# Patient Record
Sex: Female | Born: 1945
Health system: Southern US, Community
[De-identification: ages and names within clinical notes are randomized; demographics above are authoritative.]

## PROBLEM LIST (undated history)

## (undated) DIAGNOSIS — J449 Chronic obstructive pulmonary disease, unspecified: Secondary | ICD-10-CM

## (undated) DIAGNOSIS — Z860100 Personal history of colon polyps, unspecified: Secondary | ICD-10-CM

## (undated) DIAGNOSIS — M62830 Muscle spasm of back: Secondary | ICD-10-CM

## (undated) DIAGNOSIS — Z8719 Personal history of other diseases of the digestive system: Secondary | ICD-10-CM

## (undated) DIAGNOSIS — Z8709 Personal history of other diseases of the respiratory system: Secondary | ICD-10-CM

## (undated) DIAGNOSIS — D649 Anemia, unspecified: Secondary | ICD-10-CM

## (undated) DIAGNOSIS — E785 Hyperlipidemia, unspecified: Secondary | ICD-10-CM

## (undated) DIAGNOSIS — I219 Acute myocardial infarction, unspecified: Secondary | ICD-10-CM

## (undated) DIAGNOSIS — N319 Neuromuscular dysfunction of bladder, unspecified: Secondary | ICD-10-CM

## (undated) DIAGNOSIS — F329 Major depressive disorder, single episode, unspecified: Secondary | ICD-10-CM

## (undated) DIAGNOSIS — R0602 Shortness of breath: Secondary | ICD-10-CM

## (undated) DIAGNOSIS — T7840XA Allergy, unspecified, initial encounter: Secondary | ICD-10-CM

## (undated) DIAGNOSIS — G8929 Other chronic pain: Secondary | ICD-10-CM

## (undated) DIAGNOSIS — Z5189 Encounter for other specified aftercare: Secondary | ICD-10-CM

## (undated) DIAGNOSIS — I639 Cerebral infarction, unspecified: Secondary | ICD-10-CM

## (undated) DIAGNOSIS — T4145XA Adverse effect of unspecified anesthetic, initial encounter: Secondary | ICD-10-CM

## (undated) DIAGNOSIS — R233 Spontaneous ecchymoses: Secondary | ICD-10-CM

## (undated) DIAGNOSIS — K649 Unspecified hemorrhoids: Secondary | ICD-10-CM

## (undated) DIAGNOSIS — R351 Nocturia: Secondary | ICD-10-CM

## (undated) DIAGNOSIS — M719 Bursopathy, unspecified: Secondary | ICD-10-CM

## (undated) DIAGNOSIS — K592 Neurogenic bowel, not elsewhere classified: Secondary | ICD-10-CM

## (undated) DIAGNOSIS — G4700 Insomnia, unspecified: Secondary | ICD-10-CM

## (undated) DIAGNOSIS — W19XXXA Unspecified fall, initial encounter: Secondary | ICD-10-CM

## (undated) DIAGNOSIS — R238 Other skin changes: Secondary | ICD-10-CM

## (undated) DIAGNOSIS — N189 Chronic kidney disease, unspecified: Secondary | ICD-10-CM

## (undated) DIAGNOSIS — R4702 Dysphasia: Secondary | ICD-10-CM

## (undated) DIAGNOSIS — M1990 Unspecified osteoarthritis, unspecified site: Secondary | ICD-10-CM

## (undated) DIAGNOSIS — R296 Repeated falls: Secondary | ICD-10-CM

## (undated) DIAGNOSIS — M549 Dorsalgia, unspecified: Secondary | ICD-10-CM

## (undated) DIAGNOSIS — Z87442 Personal history of urinary calculi: Secondary | ICD-10-CM

## (undated) DIAGNOSIS — A0472 Enterocolitis due to Clostridium difficile, not specified as recurrent: Secondary | ICD-10-CM

## (undated) DIAGNOSIS — R202 Paresthesia of skin: Secondary | ICD-10-CM

## (undated) DIAGNOSIS — R350 Frequency of micturition: Secondary | ICD-10-CM

## (undated) DIAGNOSIS — K589 Irritable bowel syndrome without diarrhea: Secondary | ICD-10-CM

## (undated) DIAGNOSIS — J189 Pneumonia, unspecified organism: Secondary | ICD-10-CM

## (undated) DIAGNOSIS — I10 Essential (primary) hypertension: Secondary | ICD-10-CM

## (undated) DIAGNOSIS — F32A Depression, unspecified: Secondary | ICD-10-CM

## (undated) HISTORY — PX: CYSTOSCOPY: SUR368

## (undated) HISTORY — DX: Enterocolitis due to Clostridium difficile, not specified as recurrent: A04.72

## (undated) HISTORY — PX: KNEE ARTHROSCOPY: SUR90

## (undated) HISTORY — DX: Encounter for other specified aftercare: Z51.89

## (undated) HISTORY — PX: TONSILLECTOMY: SUR1361

## (undated) HISTORY — PX: NECK SURGERY: SHX720

## (undated) HISTORY — DX: Hyperlipidemia, unspecified: E78.5

## (undated) HISTORY — DX: Irritable bowel syndrome, unspecified: K58.9

## (undated) HISTORY — PX: NISSEN FUNDOPLICATION: SHX2091

## (undated) HISTORY — PX: OTHER SURGICAL HISTORY: SHX169

## (undated) HISTORY — PX: KNEE SURGERY: SHX244

## (undated) HISTORY — DX: Allergy, unspecified, initial encounter: T78.40XA

## (undated) HISTORY — PX: CARPAL TUNNEL RELEASE: SHX101

## (undated) HISTORY — DX: Chronic obstructive pulmonary disease, unspecified: J44.9

## (undated) HISTORY — PX: APPENDECTOMY: SHX54

## (undated) HISTORY — DX: Major depressive disorder, single episode, unspecified: F32.9

## (undated) HISTORY — PX: CHOLECYSTECTOMY: SHX55

## (undated) HISTORY — DX: Essential (primary) hypertension: I10

## (undated) HISTORY — DX: Depression, unspecified: F32.A

## (undated) HISTORY — PX: OOPHORECTOMY: SHX86

## (undated) HISTORY — DX: Unspecified hemorrhoids: K64.9

## (undated) HISTORY — DX: Acute myocardial infarction, unspecified: I21.9

## (undated) HISTORY — PX: HERNIA REPAIR: SHX51

---

## 1998-09-08 DIAGNOSIS — T8859XA Other complications of anesthesia, initial encounter: Secondary | ICD-10-CM

## 1998-09-08 HISTORY — DX: Other complications of anesthesia, initial encounter: T88.59XA

## 2000-10-28 ENCOUNTER — Observation Stay (HOSPITAL_COMMUNITY): Admission: RE | Admit: 2000-10-28 | Discharge: 2000-10-30 | Payer: Self-pay | Admitting: Neurosurgery

## 2000-11-09 ENCOUNTER — Encounter: Admission: RE | Admit: 2000-11-09 | Discharge: 2000-11-09 | Payer: Self-pay | Admitting: Neurosurgery

## 2000-12-25 ENCOUNTER — Encounter: Admission: RE | Admit: 2000-12-25 | Discharge: 2000-12-25 | Payer: Self-pay | Admitting: Neurosurgery

## 2001-04-06 ENCOUNTER — Encounter: Admission: RE | Admit: 2001-04-06 | Discharge: 2001-04-06 | Payer: Self-pay | Admitting: Neurosurgery

## 2001-06-24 ENCOUNTER — Other Ambulatory Visit: Admission: RE | Admit: 2001-06-24 | Discharge: 2001-06-24 | Payer: Self-pay | Admitting: Obstetrics and Gynecology

## 2001-06-24 ENCOUNTER — Ambulatory Visit (HOSPITAL_COMMUNITY): Admission: RE | Admit: 2001-06-24 | Discharge: 2001-06-24 | Payer: Self-pay | Admitting: Obstetrics and Gynecology

## 2001-06-24 ENCOUNTER — Encounter: Payer: Self-pay | Admitting: Obstetrics and Gynecology

## 2002-01-19 ENCOUNTER — Encounter: Admission: RE | Admit: 2002-01-19 | Discharge: 2002-01-19 | Payer: Self-pay | Admitting: Neurosurgery

## 2002-02-25 ENCOUNTER — Ambulatory Visit (HOSPITAL_COMMUNITY): Admission: RE | Admit: 2002-02-25 | Discharge: 2002-02-25 | Payer: Self-pay | Admitting: Obstetrics and Gynecology

## 2002-02-25 ENCOUNTER — Encounter: Payer: Self-pay | Admitting: Obstetrics and Gynecology

## 2002-07-05 ENCOUNTER — Encounter: Payer: Self-pay | Admitting: Obstetrics and Gynecology

## 2002-07-05 ENCOUNTER — Ambulatory Visit (HOSPITAL_COMMUNITY): Admission: RE | Admit: 2002-07-05 | Discharge: 2002-07-05 | Payer: Self-pay | Admitting: Obstetrics and Gynecology

## 2002-07-11 ENCOUNTER — Ambulatory Visit (HOSPITAL_COMMUNITY): Admission: RE | Admit: 2002-07-11 | Discharge: 2002-07-11 | Payer: Self-pay | Admitting: Obstetrics and Gynecology

## 2002-07-11 ENCOUNTER — Encounter: Payer: Self-pay | Admitting: Obstetrics and Gynecology

## 2002-09-07 ENCOUNTER — Encounter: Payer: Self-pay | Admitting: Orthopaedic Surgery

## 2002-09-07 ENCOUNTER — Ambulatory Visit (HOSPITAL_COMMUNITY): Admission: RE | Admit: 2002-09-07 | Discharge: 2002-09-07 | Payer: Self-pay

## 2002-09-20 ENCOUNTER — Ambulatory Visit (HOSPITAL_COMMUNITY): Admission: RE | Admit: 2002-09-20 | Discharge: 2002-09-20 | Payer: Self-pay | Admitting: Neurosurgery

## 2002-10-19 ENCOUNTER — Observation Stay (HOSPITAL_COMMUNITY): Admission: RE | Admit: 2002-10-19 | Discharge: 2002-10-20 | Payer: Self-pay | Admitting: Neurosurgery

## 2002-10-22 ENCOUNTER — Inpatient Hospital Stay (HOSPITAL_COMMUNITY): Admission: EM | Admit: 2002-10-22 | Discharge: 2002-10-23 | Payer: Self-pay | Admitting: Emergency Medicine

## 2002-10-22 ENCOUNTER — Encounter: Payer: Self-pay | Admitting: Neurosurgery

## 2002-10-25 ENCOUNTER — Inpatient Hospital Stay (HOSPITAL_COMMUNITY): Admission: AD | Admit: 2002-10-25 | Discharge: 2002-10-28 | Payer: Self-pay | Admitting: Neurosurgery

## 2003-04-03 ENCOUNTER — Ambulatory Visit (HOSPITAL_COMMUNITY): Admission: RE | Admit: 2003-04-03 | Discharge: 2003-04-03 | Payer: Self-pay | Admitting: Pulmonary Disease

## 2003-05-04 ENCOUNTER — Ambulatory Visit (HOSPITAL_COMMUNITY): Admission: RE | Admit: 2003-05-04 | Discharge: 2003-05-04 | Payer: Self-pay | Admitting: Pulmonary Disease

## 2003-12-05 ENCOUNTER — Ambulatory Visit (HOSPITAL_COMMUNITY): Admission: RE | Admit: 2003-12-05 | Discharge: 2003-12-05 | Payer: Self-pay

## 2004-01-25 ENCOUNTER — Emergency Department (HOSPITAL_COMMUNITY): Admission: EM | Admit: 2004-01-25 | Discharge: 2004-01-25 | Payer: Self-pay | Admitting: Emergency Medicine

## 2004-07-12 ENCOUNTER — Ambulatory Visit (HOSPITAL_COMMUNITY): Admission: RE | Admit: 2004-07-12 | Discharge: 2004-07-12 | Payer: Self-pay | Admitting: Internal Medicine

## 2004-07-12 ENCOUNTER — Ambulatory Visit: Payer: Self-pay | Admitting: Internal Medicine

## 2005-06-26 ENCOUNTER — Ambulatory Visit (HOSPITAL_COMMUNITY): Admission: RE | Admit: 2005-06-26 | Discharge: 2005-06-26 | Payer: Self-pay | Admitting: Obstetrics and Gynecology

## 2005-08-31 ENCOUNTER — Inpatient Hospital Stay (HOSPITAL_COMMUNITY): Admission: EM | Admit: 2005-08-31 | Discharge: 2005-09-05 | Payer: Self-pay | Admitting: Emergency Medicine

## 2005-09-05 ENCOUNTER — Encounter: Payer: Self-pay | Admitting: Cardiology

## 2005-09-05 DIAGNOSIS — J15212 Pneumonia due to Methicillin resistant Staphylococcus aureus: Secondary | ICD-10-CM | POA: Insufficient documentation

## 2005-09-08 DIAGNOSIS — I219 Acute myocardial infarction, unspecified: Secondary | ICD-10-CM

## 2005-09-08 DIAGNOSIS — IMO0001 Reserved for inherently not codable concepts without codable children: Secondary | ICD-10-CM

## 2005-09-08 DIAGNOSIS — Z5189 Encounter for other specified aftercare: Secondary | ICD-10-CM

## 2005-09-08 HISTORY — DX: Acute myocardial infarction, unspecified: I21.9

## 2005-09-08 HISTORY — DX: Reserved for inherently not codable concepts without codable children: IMO0001

## 2005-09-08 HISTORY — DX: Encounter for other specified aftercare: Z51.89

## 2005-11-05 ENCOUNTER — Ambulatory Visit: Payer: Self-pay | Admitting: Internal Medicine

## 2005-11-05 ENCOUNTER — Inpatient Hospital Stay (HOSPITAL_COMMUNITY): Admission: AD | Admit: 2005-11-05 | Discharge: 2005-11-18 | Payer: Self-pay | Admitting: Pulmonary Disease

## 2006-04-07 ENCOUNTER — Ambulatory Visit (HOSPITAL_COMMUNITY): Admission: RE | Admit: 2006-04-07 | Discharge: 2006-04-07 | Payer: Self-pay | Admitting: Internal Medicine

## 2006-04-07 ENCOUNTER — Ambulatory Visit: Payer: Self-pay | Admitting: Internal Medicine

## 2006-04-07 HISTORY — PX: ESOPHAGOGASTRODUODENOSCOPY: SHX1529

## 2006-06-03 ENCOUNTER — Ambulatory Visit (HOSPITAL_COMMUNITY): Payer: Self-pay | Admitting: Pulmonary Disease

## 2006-06-03 ENCOUNTER — Encounter (HOSPITAL_COMMUNITY): Admission: RE | Admit: 2006-06-03 | Discharge: 2006-06-06 | Payer: Self-pay | Admitting: Pulmonary Disease

## 2006-06-29 ENCOUNTER — Encounter (HOSPITAL_COMMUNITY): Admission: RE | Admit: 2006-06-29 | Discharge: 2006-07-29 | Payer: Self-pay | Admitting: Pulmonary Disease

## 2006-06-29 ENCOUNTER — Ambulatory Visit (HOSPITAL_COMMUNITY): Admission: RE | Admit: 2006-06-29 | Discharge: 2006-06-29 | Payer: Self-pay | Admitting: Obstetrics and Gynecology

## 2006-07-06 ENCOUNTER — Ambulatory Visit (HOSPITAL_COMMUNITY): Admission: RE | Admit: 2006-07-06 | Discharge: 2006-07-06 | Payer: Self-pay | Admitting: Obstetrics & Gynecology

## 2006-07-29 ENCOUNTER — Ambulatory Visit: Payer: Self-pay | Admitting: Cardiology

## 2006-07-29 ENCOUNTER — Inpatient Hospital Stay (HOSPITAL_COMMUNITY): Admission: AD | Admit: 2006-07-29 | Discharge: 2006-08-05 | Payer: Self-pay | Admitting: Pulmonary Disease

## 2006-09-14 ENCOUNTER — Ambulatory Visit: Payer: Self-pay | Admitting: Internal Medicine

## 2006-09-24 ENCOUNTER — Ambulatory Visit (HOSPITAL_COMMUNITY): Admission: RE | Admit: 2006-09-24 | Discharge: 2006-09-24 | Payer: Self-pay | Admitting: Internal Medicine

## 2006-09-24 ENCOUNTER — Ambulatory Visit: Payer: Self-pay | Admitting: Internal Medicine

## 2006-09-24 HISTORY — PX: COLONOSCOPY: SHX174

## 2007-05-20 ENCOUNTER — Observation Stay (HOSPITAL_COMMUNITY): Admission: AD | Admit: 2007-05-20 | Discharge: 2007-05-21 | Payer: Self-pay | Admitting: Pulmonary Disease

## 2007-07-02 ENCOUNTER — Ambulatory Visit (HOSPITAL_COMMUNITY): Admission: RE | Admit: 2007-07-02 | Discharge: 2007-07-02 | Payer: Self-pay | Admitting: Obstetrics and Gynecology

## 2007-07-29 ENCOUNTER — Other Ambulatory Visit: Admission: RE | Admit: 2007-07-29 | Discharge: 2007-07-29 | Payer: Self-pay | Admitting: Obstetrics and Gynecology

## 2007-08-13 ENCOUNTER — Ambulatory Visit (HOSPITAL_COMMUNITY): Admission: RE | Admit: 2007-08-13 | Discharge: 2007-08-13 | Payer: Self-pay | Admitting: Obstetrics & Gynecology

## 2008-11-07 ENCOUNTER — Ambulatory Visit (HOSPITAL_COMMUNITY): Admission: RE | Admit: 2008-11-07 | Discharge: 2008-11-07 | Payer: Self-pay | Admitting: Pulmonary Disease

## 2009-04-19 ENCOUNTER — Encounter: Payer: Self-pay | Admitting: Cardiology

## 2009-04-20 ENCOUNTER — Encounter: Payer: Self-pay | Admitting: Cardiology

## 2009-04-24 DIAGNOSIS — J449 Chronic obstructive pulmonary disease, unspecified: Secondary | ICD-10-CM | POA: Insufficient documentation

## 2009-04-24 DIAGNOSIS — J45909 Unspecified asthma, uncomplicated: Secondary | ICD-10-CM | POA: Insufficient documentation

## 2009-04-24 DIAGNOSIS — I10 Essential (primary) hypertension: Secondary | ICD-10-CM | POA: Insufficient documentation

## 2009-05-04 ENCOUNTER — Ambulatory Visit: Payer: Self-pay | Admitting: Cardiology

## 2009-05-04 DIAGNOSIS — M546 Pain in thoracic spine: Secondary | ICD-10-CM | POA: Insufficient documentation

## 2009-05-04 DIAGNOSIS — R079 Chest pain, unspecified: Secondary | ICD-10-CM | POA: Insufficient documentation

## 2009-05-04 DIAGNOSIS — R002 Palpitations: Secondary | ICD-10-CM | POA: Insufficient documentation

## 2009-05-04 DIAGNOSIS — I5181 Takotsubo syndrome: Secondary | ICD-10-CM | POA: Insufficient documentation

## 2009-05-11 ENCOUNTER — Ambulatory Visit: Payer: Self-pay | Admitting: Cardiology

## 2009-05-11 ENCOUNTER — Encounter (HOSPITAL_COMMUNITY): Admission: RE | Admit: 2009-05-11 | Discharge: 2009-06-06 | Payer: Self-pay | Admitting: Cardiology

## 2009-05-11 ENCOUNTER — Encounter: Payer: Self-pay | Admitting: Cardiology

## 2009-05-15 ENCOUNTER — Encounter: Payer: Self-pay | Admitting: Cardiology

## 2009-05-16 ENCOUNTER — Encounter: Payer: Self-pay | Admitting: Cardiology

## 2009-05-23 ENCOUNTER — Ambulatory Visit: Payer: Self-pay | Admitting: Cardiology

## 2009-07-10 ENCOUNTER — Ambulatory Visit (HOSPITAL_COMMUNITY): Admission: RE | Admit: 2009-07-10 | Discharge: 2009-07-10 | Payer: Self-pay | Admitting: Obstetrics and Gynecology

## 2009-08-08 ENCOUNTER — Other Ambulatory Visit: Admission: RE | Admit: 2009-08-08 | Discharge: 2009-08-08 | Payer: Self-pay | Admitting: Obstetrics and Gynecology

## 2009-08-13 ENCOUNTER — Encounter: Payer: Self-pay | Admitting: Obstetrics & Gynecology

## 2009-08-13 ENCOUNTER — Ambulatory Visit (HOSPITAL_COMMUNITY): Admission: RE | Admit: 2009-08-13 | Discharge: 2009-08-13 | Payer: Self-pay | Admitting: Family Medicine

## 2009-08-17 ENCOUNTER — Encounter (HOSPITAL_COMMUNITY): Admission: RE | Admit: 2009-08-17 | Discharge: 2009-09-05 | Payer: Self-pay | Admitting: Obstetrics & Gynecology

## 2009-08-24 ENCOUNTER — Telehealth (INDEPENDENT_AMBULATORY_CARE_PROVIDER_SITE_OTHER): Payer: Self-pay | Admitting: *Deleted

## 2009-09-03 ENCOUNTER — Encounter (INDEPENDENT_AMBULATORY_CARE_PROVIDER_SITE_OTHER): Payer: Self-pay | Admitting: General Surgery

## 2009-09-03 ENCOUNTER — Observation Stay (HOSPITAL_COMMUNITY): Admission: RE | Admit: 2009-09-03 | Discharge: 2009-09-04 | Payer: Self-pay | Admitting: General Surgery

## 2009-12-15 ENCOUNTER — Emergency Department (HOSPITAL_COMMUNITY): Admission: EM | Admit: 2009-12-15 | Discharge: 2009-12-15 | Payer: Self-pay | Admitting: Emergency Medicine

## 2010-07-25 ENCOUNTER — Ambulatory Visit (HOSPITAL_COMMUNITY): Admission: RE | Admit: 2010-07-25 | Discharge: 2010-07-25 | Payer: Self-pay | Admitting: Obstetrics and Gynecology

## 2010-08-15 ENCOUNTER — Encounter (INDEPENDENT_AMBULATORY_CARE_PROVIDER_SITE_OTHER): Payer: Self-pay | Admitting: *Deleted

## 2010-09-29 ENCOUNTER — Encounter: Payer: Self-pay | Admitting: Obstetrics and Gynecology

## 2010-10-08 NOTE — Letter (Signed)
Summary: Appointment - Reminder 2  [facility] [place]. [address], [place] [address]   Phone: [phone]  Fax: [phone]     August 15, 2010 MRN: [id number]   Jordan [name] [address] [place], [place]  [address]   Dear [name],  [place] records indicate that it is time to schedule a follow-up appointment.  Dr. Domenic Polite         recommended that you follow up with [place] in   05/2010 PAST DUE        . It is very important that we reach you to schedule this appointment. We look forward to participating in your health care needs. Please contact us at the number listed above at your earliest convenience to schedule your appointment.  If you are unable to make an appointment at this time, give [place] a call so we can update our records.     Sincerely,   Public relations account executive

## 2010-10-08 NOTE — Miscellaneous (Signed)
Summary: 2 D Echo  Clinical Lists Changes  Observations: Added new observation of PRIMARY MD: Dr. Sinda Du (05/16/2009 9:25) Added new observation of ECHOINTERP:  SONOGRAPHER  Signe Colt, MD    REFERRING    Alonza Bogus    PERFORMING   Aneta Mins [place]   cc:    --------------------------------------------------------------------   Indications:   Chest pain 786.51.    --------------------------------------------------------------------   History:  PMH: Dyspnea. PMH: asthma Risk factors: Hypertension.    --------------------------------------------------------------------   Study Conclusions   Left ventricle: The cavity size was normal. Wall thickness was   normal. Systolic function was normal. The estimated ejection   fraction was in the range of 60% to 65%. Wall motion was normal;   there were no regional wall motion abnormalities.    Transthoracic   echocardiography. M-mode, complete 2D, spectral Doppler, and color   Doppler. Patient status: Outpatient. Location: Echo laboratory.  (05/11/2009 9:28)      Echocardiogram  Procedure date:  05/11/2009  Findings:       SONOGRAPHER  Signe Colt, MD    REFERRING    Alonza Bogus    PERFORMING   Aneta Mins [place]   cc:    --------------------------------------------------------------------   Indications:   Chest pain 786.51.    --------------------------------------------------------------------   History:  PMH: Dyspnea. PMH: asthma Risk factors: Hypertension.    --------------------------------------------------------------------   Study Conclusions   Left ventricle: The cavity size was normal. Wall thickness was   normal. Systolic function was normal. The estimated ejection   fraction was in the range of 60% to 65%. Wall motion was normal;   there were no regional wall motion abnormalities.    Transthoracic   echocardiography.  M-mode, complete 2D, spectral Doppler, and color   Doppler. Patient status: Outpatient. Location: Echo laboratory.

## 2010-10-08 NOTE — Letter (Signed)
Summary: [facility]   Imported By: Doretha Sou, CNA 05/10/2009 15:54:09  _____________________________________________________________________  External Attachment:    Type:   Image     Comment:   External Document

## 2010-10-08 NOTE — Assessment & Plan Note (Signed)
Summary: myoview/tg   Exercise Tolerance Test Cardiovascular Risk History:      Positive major cardiovascular risk factors include female age over 66 years old and hypertension.  Negative major cardiovascular risk factors include non-tobacco-user status.    Exercise Tolerance Test Results:    MPHR (bpm):        157    85% MPHR (bpm):     133  Cardiovascular Risk Assessment/Plan:      The patient's hypertensive risk group is category B: At least one risk factor (excluding diabetes) with no target organ damage.

## 2010-10-08 NOTE — Assessment & Plan Note (Signed)
Summary: CHEST PAIN HX OF MI   Visit Type:  Initial Consult Primary Provider:  Dr. Sinda Du  CC:  chest pain and palpitations.  History of Present Illness: Jordan [name] is a 65 year old woman with a history of chronic lung disease described as "asthma" and followed by Dr. Cyndie Chime at [facility] in the pulmonary section. She also reports a history of Takotsubo cardiomyopathy, diagnosed back in 2007, in the setting of other acute illness. She was seen by Dr. Lattie Haw here at [facility], and also ultimately underwent a cardiac catheterization at [facility] demonstrating reportedly "normal arteries" with subsequent followup for a limited time at [facility]. She also has what sounds like a history of paroxysmal supraventricular tachycardia. She was remotely on digoxin, but not for several years. Jordan [name] states that during the summer months she has been more short of breath than usual, possibly related to the heat, but has also experienced a left-sided "sharp" and also "dull" chest pain that has been sporadic, typically lasting no more than 5 minutes. She mentioned this during an evaluation at [facility] in July, and it was suggested to her that she may need a followup stress test. She presents today to discuss this further. In addition she's been experiencing palpitations described as a rapid heartbeat, lasting no more than a minute, and occurring approximately one to 2 times a week. She has not had any dizziness or syncope associated with this.  Current Medications (verified): 1)  Aspir-Low 81 Mg Tbec (Aspirin) .... Take 1 Tab Daily 2)  Lipitor 20 Mg Tabs (Atorvastatin Calcium) .... Take 1 Tab Daily 3)  Nexium 20 Mg Cpdr (Esomeprazole Magnesium) .... Take 1 Tab Daily 4)  Prozac 40 Mg Caps (Fluoxetine Hcl) .... Take 1 Tab Daily 5)  Advair Diskus 500-50 Mcg/dose Aepb (Fluticasone-Salmeterol) 6)  Verapamil Hcl Cr 240 Mg Cr-Tabs (Verapamil Hcl) .... Take 1 Tab Daily 7)  Singulair 10 Mg Tabs (Montelukast Sodium)  .... Take 1 Tab Daily 8)  Omega-3 350 Mg Caps (Omega-3 Fatty Acids) 9)  Prednisone 10 Mg Tabs (Prednisone) .... Take 1 Tab Daily 10)  Albuterol  Powd (Albuterol) 11)  Spiriva Handihaler 18 Mcg Caps (Tiotropium Bromide Monohydrate) .Marland Kitchen.. 1 Capsule Daily  Allergies (verified): 1)  ! Cardizem 2)  ! * Sulfar 3)  ! Barbiturates  Past History:  Past Medical History: Asthma MRSA pneumonia G E R D Hypertension Takotsubo cardiomyopathy PSVT  Past Surgical History: Oophorectomy C-section Nissen fundoplication Pseudomeningocele Arthoscopic knee surgery  Social History: Retired  Divorced  Tobacco Use - former Alcohol Use - yes Regular Exercise - no Drug Use - no  Review of Systems  The patient denies anorexia, fever, hoarseness, syncope, peripheral edema, and prolonged cough.         She does report intermittent wheezing and use of rescue inhalers. She has had some urinary frequency and also reflux symptoms. Otherwise reviewed and negative.  Vital Signs:  Patient profile:   65 year old female Height:      63 inches Weight:      148 pounds BMI:     26.31 Pulse rate:   98 / minute BP sitting:   162 / 91  (right arm)  Vitals Entered By: Doretha Sou, CNA (May 04, 2009 2:32 PM)  Physical Exam  Additional Exam:  Chronically ill-appearing woman in no acute distress. HEENT: Conjunctiva and lids normal, oropharynx with poor dentition. Neck: Supple, no loud carotid bruits or thyromegaly. Lungs: Diminished breath sounds with end expiratory "squeaks"  and rhonchi, no active wheezing or labored breathing. Cardiac: Diminished regular heart sounds, no obvious S3 gallop. No loud systolic murmur. Abdomen: Soft, nontender, bowel sounds present. Extremities: Trace edema distally, distal pulses are one plus. Skin: Warm and dry. Musculoskeletal: No kyphosis. Neuropsychiatric: Alert and oriented x3, affect somewhat guarded.   EKG  Procedure date:  05/04/2009  Findings:       Normal sinus rhythm at 87 beats per minute with incomplete right bundle branch block pattern and nonspecific T-wave changes. No old tracing available for comparison.  Impression & Recommendations:  Problem # 1:  CHEST PAIN UNSPECIFIED (ICD-786.50)  Fairly atypical in description, and associated with known chronic lung disease. Electrocardiogram is nonspecific. Previous cardiac catheterization at [facility] in 2007 reportedly showed normal coronary arteries, associated with Takotsubo cardiomyopathy. She has not had followup ischemic evaluation since that time. Plan will be to proceed with an exercise Myoview on medical therapy (half dose verapamil), and if adequate heart rate cannot be achieved, the study can be switched to a dobutamine Myoview. she will followup of the next few weeks to discuss the results.  Her updated medication list for this problem includes:    Aspir-low 81 Mg Tbec (Aspirin) .Marland Kitchen... Take 1 tab daily    Verapamil Hcl Cr 240 Mg Cr-tabs (Verapamil hcl) .Marland Kitchen... Take 1 tab daily  Problem # 2:  PALPITATIONS (ICD-785.1)  Etiology not certain, however with history of paroxysmal supraventricular tachycardia, this may well be the cause. I have asked her to increase her baseline verapamil SR to 240 mg daily. We can followup ultimately with a CardioNet monitor if symptoms persist or progress.  Her updated medication list for this problem includes:    Aspir-low 81 Mg Tbec (Aspirin) .Marland Kitchen... Take 1 tab daily    Verapamil Hcl Cr 240 Mg Cr-tabs (Verapamil hcl) .Marland Kitchen... Take 1 tab daily  Problem # 3:  TAKOTSUBO SYNDROME (ICD-429.83)  Diagnosed in 2007. We will plan a followup 2-D echocardiogram to reassess cardiac structure and function, in concert with her Myoview ordered above.  Other Orders: Echocardiogram (Echo) Nuclear Stress Test (Nuc Stress Test)  Patient Instructions: 1)  Your physician recommends that you schedule a follow-up appointment in: 3 weeks 2)  Your physician has requested that  you have an echocardiogram.  Echocardiography is a painless test that uses sound waves to create images of your heart. It provides your doctor with information about the size and shape of your heart and how well your heart's chambers and valves are working.  This procedure takes approximately one hour. There are no restrictions for this procedure. 3)  Your physician has requested that you have an exercise stress myoview.  For further information please visit [email or web address].  Please follow instruction sheet, as given. 4)  Only take 1/2 tablet of Verapamil 240mg  the morning of Stress test

## 2010-10-08 NOTE — Letter (Signed)
Summary: [facility]  [facility] [place]. [address] [place] [address]   Phone: [phone]  Fax: [phone]    Nuclear Medicine 1-Day Stress Test Information Sheet  Re:     Jordan [name]   DOB:     1945/10/15 MRN:     [id number] Weight:  Appointment Date: Register at: Appointment Time: Referring MD:  _X__Exercise Stress  __Adenosine   __Dobutamine  __Lexiscan  __Persantine   __Thallium  Urgency: ____1 (next day)   ____2 (one week)    ____3 (PRN)  Patient will receive Follow Up call with results: Patient needs follow-up appointment:  Instructions regarding medication:  How to prepare for your stress test: 1. DO NOT eat or dring 6 hours prior to your arrival time. This includes no caffeine (coffee, tea, sodas, chocolate) if you were instructed to take your medications, drink water with it. 2. DO NOT use any tobacco products for at leaset 8 hours prior to arrival. 3. DO NOT wear dresses or any clothing that may have metal clasps or buttons. 4. Wear short sleeve shirts, loose clothing, and comfortalbe walking shoes. 5. DO NOT use lotions, oils or powder on your chest before the test. 6. The test will take approximately 3-4 hours from the time you arrive until completion. 7. To register the day of the test, go to the Short Stay entrance at [facility]. 8. If you must cancel your test, call [phone] as soon as you are aware.  After you arrive for test:   When you arrive at [facility], you will go to Short Stay to be registered. They will then send you to Radiology to check in. The Nuclear Medicine Tech will get you and start an IV in your arm or hand. A small amount of a radioactive tracer will then be injected into your IV. This tracer will then have to circulate for 30-45 minutes. During this time you will wait in the waiting room and you will be able to drink something without caffeine. A series of pictures will be taken of  your heart follwoing this waiting period. After the 1st set of pictures you will go to the stress lab to get ready for your stress test. During the stress test, another small amount of a radioactive tracer will be injected through your IV. When the stress test is complete, there is a short rest period while your heart rate and blood pressure will be monitored. When this monitoring period is complete you will have another set of pictrues taken. (The same as the 1st set of pictures). These pictures are taken between 15 minutes and 1 hour after the stress test. The time depends on the type of stress test you had. Your doctor will inform you of your test results within 7 days after test.    The possibilities of certain changes are possible during the test. They include abnormal blood pressure and disorders of the heart. Side effects of persantine or adenosine can include flushing, chest pain, shortness of breath, stomach tightness, headache and light-headedness. These side effects usually do not last long and are self-resolving. Every effort will be made to keep you comfortable and to minimize complications by obtaining a medical history and by close observation during the test. Emergency equipment, medications, and trained personnel are available to deal with any unusual situation which may arise.  Please notify office at least 48 hours in advance if you are unable to keep  this appt.

## 2010-10-08 NOTE — Letter (Signed)
Summary: [place] [facility], general practice at [place]. [address] [place] [address]   Phone: [phone]  Fax: [phone]      May 15, 2009 MRN: [id number]   Jordan [name] [address] [place],   [address]   Dear [name],  Your test ordered by Rande Lawman has been reviewed by your physician (or physician assistant) and was found to be normal or stable. Your physician (or physician assistant) felt no changes were needed at this time.  ____ Echocardiogram  __X__ Cardiac Stress Test  ____ Lab Work  ____ Peripheral vascular study of arms, legs or neck  ____ CT scan or X-ray  ____ Lung or Breathing test  ____ Other:   Thank you.   Jeani Hawking Via LPN    Jacqulyn Ducking, MD, F.A.C.Renella Cunas, MD, F.A.C.C Cristopher Peru, MD, F.A.C.C Rozann Lesches, MD, F.A.C.C Jenkins Rouge, MD, Leana Gamer.C.C

## 2010-10-08 NOTE — Progress Notes (Signed)
Summary: Surgical Clearance  Phone Note From Other Clinic Call back at [phone]   Caller: Dr.Mark Arnoldo Morale' office Summary of Call: Need note clearing Jordan [name] for [employer] to be done on 09/03/09/tg Initial call taken by: [facility],  August 24, 2009 9:57 AM  Follow-up for Phone Call        See last office note from 9/10.  Patient stable at that time with reassuring echocardiogram and Myoview.  If she has not developed any new symptoms, then she should be able to proceed with elective cholecystectomy at an acceptable cardiac risk.  Be sure to continue regular medications. Send this and copy of last office note to surgeon. She can be seen in consult perioperatively if needed. Follow-up by: Beckie Salts, MD, [facility],  August 24, 2009 11:19 AM

## 2010-10-08 NOTE — Letter (Signed)
Summary: [place] Results Doctor, general practice at [place]. [address], [place] [address]   Phone: [phone]  Fax: [phone]      May 16, 2009 MRN: [id number]   Jordan [name] [address] [place],   [address]   Dear [name],  Your test ordered by Rande Lawman has been reviewed by your physician (or physician assistant) and was found to be normal or stable. Your physician (or physician assistant) felt no changes were needed at this time.  __X__ Echocardiogram  ____ Cardiac Stress Test  ____ Lab Work  ____ Peripheral vascular study of arms, legs or neck  ____ CT scan or X-ray  ____ Lung or Breathing test  ____ Other:   Thank you.   Jeani Hawking Via LPN Please continue with current medical treatment.   *Jacqulyn Ducking, MD, F.A.C.Renella Cunas, MD, F.A.C.C Cristopher Peru, MD, F.A.C.C Rozann Lesches, MD, F.A.C.C Jenkins Rouge, MD, Leana Gamer.C.C

## 2010-10-08 NOTE — Assessment & Plan Note (Signed)
Summary: 3 wk f/u per checkout on 8/27/tg   Visit Type:  Follow-up Primary Provider:  Dr. Sinda Du   History of Present Illness: 65 year old woman returns for a followup visit. History is detailed in the previous note. She reports improvement in sense of palpitations following an increase in verapamil dosing. I referred her for followup evaluation with previous documentation of Takotsubo cardiomyopathy. Both ischemic and structural evaluations were reassuring. Left ventricular systolic function is normal, and Myoview indicated no focal areas of ischemia, with impaired exercise tolerance likely related to pulmonary status. I reviewed these studies with her today and provided reassurance. She plans to continue regular pulmonary followup with Dr. Luan Pulling and also at [facility].  Current Medications (verified): 1)  Aspir-Low 81 Mg Tbec (Aspirin) .... Take 1 Tab Daily 2)  Lipitor 20 Mg Tabs (Atorvastatin Calcium) .... Take 1 Tab Daily 3)  Nexium 20 Mg Cpdr (Esomeprazole Magnesium) .... Take 1 Tab Daily 4)  Prozac 40 Mg Caps (Fluoxetine Hcl) .... Take 1 Tab Daily 5)  Advair Diskus 500-50 Mcg/dose Aepb (Fluticasone-Salmeterol) 6)  Verapamil Hcl Cr 240 Mg Cr-Tabs (Verapamil Hcl) .... Take 1 Tab Daily 7)  Singulair 10 Mg Tabs (Montelukast Sodium) .... Take 1 Tab Daily 8)  Omega-3 350 Mg Caps (Omega-3 Fatty Acids) 9)  Prednisone 10 Mg Tabs (Prednisone) .... Take 1 Tab Daily 10)  Albuterol  Powd (Albuterol) 11)  Spiriva Handihaler 18 Mcg Caps (Tiotropium Bromide Monohydrate) .Marland Kitchen.. 1 Capsule Daily  Allergies (verified): 1)  ! Cardizem 2)  ! * Sulfar 3)  ! Barbiturates  Past History:  Past Medical History: Last updated: 05/04/2009 Asthma MRSA pneumonia G E R D Hypertension Takotsubo cardiomyopathy PSVT  Social History: Last updated: 05/04/2009 Retired  Divorced  Tobacco Use - former Alcohol Use - yes Regular Exercise - no Drug Use - no  Review of Systems       The  patient complains of dyspnea on exertion.  The patient denies fever, weight loss, chest pain, hemoptysis, melena, and hematochezia.         Otherwise reviewed and negative.  Vital Signs:  Patient profile:   65 year old female Weight:      151 pounds BMI:     26.85 Pulse rate:   90 / minute BP sitting:   140 / 78  (right arm)  Vitals Entered By: Doretha Sou, CNA (May 23, 2009 3:59 PM)  Physical Exam  Additional Exam:  Chronically ill-appearing woman in no acute distress. HEENT: Conjunctiva and lids normal, oropharynx with poor dentition. Neck: Supple, no loud carotid bruits or thyromegaly. Lungs: Diminished breath sounds with end expiratory "squeaks" and rhonchi, no active wheezing or labored breathing. Cardiac: Diminished regular heart sounds, no obvious S3 gallop. No loud systolic murmur. Abdomen: Soft, nontender, bowel sounds present. Extremities: Trace edema distally, distal pulses are one plus. Skin: Warm and dry. Musculoskeletal: No kyphosis. Neuropsychiatric: Alert and oriented x3, affect somewhat guarded.   Impression & Recommendations:  Problem # 1:  TAKOTSUBO SYNDROME (ICD-429.83)  Followup echocardiogram demonstrates normal left ventricular systolic function at this point. She is not describing any active chest pain symptoms, and it would seem that her shortness of breath is most related to her pulmonary status. Followup ischemic evaluation via Myoview was also very reassuring. At this point we will plan an annual followup visit.  Problem # 2:  PALPITATIONS (ICD-785.1)  Improved following increase in verapamil dosing. She does have a history of paroxysmal supraventricular tachycardia. This can be followed over  time.  Her updated medication list for this problem includes:    Aspir-low 81 Mg Tbec (Aspirin) .Marland Kitchen... Take 1 tab daily    Verapamil Hcl Cr 240 Mg Cr-tabs (Verapamil hcl) .Marland Kitchen... Take 1 tab daily  Patient Instructions: 1)  Your physician recommends that  you schedule a follow-up appointment in: 1 year 2)  Your physician recommends that you continue on your current medications as directed. Please refer to the Current Medication list given to you today.

## 2010-10-08 NOTE — Letter (Signed)
Summary: DR.EDWARD HAWKINS OFFICE NOTE  DR.EDWARD HAWKINS OFFICE NOTE   Imported By: Doretha Sou, CNA 06/28/2009 14:39:41  _____________________________________________________________________  External Attachment:    Type:   Image     Comment:   External Document

## 2010-12-09 LAB — BASIC METABOLIC PANEL
BUN: 15 mg/dL (ref 6–23)
CO2: 27 mEq/L (ref 19–32)
Calcium: 9.4 mg/dL (ref 8.4–10.5)
Chloride: 102 mEq/L (ref 96–112)
Creatinine, Ser: 1.06 mg/dL (ref 0.4–1.2)
GFR calc Af Amer: >60 mL/min (ref >60)
GFR calc non Af Amer: 52 mL/min — ABNORMAL LOW (ref >60)
Glucose, Bld: 125 mg/dL — ABNORMAL HIGH (ref 70–99)
Potassium: 4.5 mEq/L (ref 3.5–5.1)
Sodium: 139 mEq/L (ref 135–145)

## 2010-12-09 LAB — DIFFERENTIAL
Basophils Absolute: 0.1 10*3/uL (ref 0.0–0.1)
Basophils Relative: 1 % (ref 0–1)
Eosinophils Absolute: 0.7 10*3/uL (ref 0.0–0.7)
Eosinophils Relative: 8 % — ABNORMAL HIGH (ref 0–5)
Lymphocytes Relative: 23 % (ref 12–46)
Lymphs Abs: 1.8 10*3/uL (ref 0.7–4.0)
Monocytes Absolute: 0.8 10*3/uL (ref 0.1–1.0)
Monocytes Relative: 10 % (ref 3–12)
Neutro Abs: 4.4 10*3/uL (ref 1.7–7.7)
Neutrophils Relative %: 57 % (ref 43–77)

## 2010-12-09 LAB — CBC
HCT: 36.8 % (ref 36.0–46.0)
Hemoglobin: 12.4 g/dL (ref 12.0–15.0)
MCHC: 33.7 g/dL (ref 30.0–36.0)
MCV: 93.4 fL (ref 78.0–100.0)
Platelets: 215 10*3/uL (ref 150–400)
RBC: 3.95 MIL/uL (ref 3.87–5.11)
RDW: 13 % (ref 11.5–15.5)
WBC: 7.8 10*3/uL (ref 4.0–10.5)

## 2010-12-16 ENCOUNTER — Other Ambulatory Visit: Payer: Self-pay | Admitting: Orthopedic Surgery

## 2010-12-16 ENCOUNTER — Ambulatory Visit
Admission: RE | Admit: 2010-12-16 | Discharge: 2010-12-16 | Disposition: A | Discharge status: Home / Self Care | Payer: Medicare Other | Source: Ambulatory Visit | Attending: Orthopedic Surgery | Admitting: Orthopedic Surgery

## 2010-12-16 ENCOUNTER — Encounter (HOSPITAL_BASED_OUTPATIENT_CLINIC_OR_DEPARTMENT_OTHER)
Admission: RE | Admit: 2010-12-16 | Discharge: 2010-12-16 | Disposition: A | Discharge status: Home / Self Care | Payer: Medicare Other | Source: Ambulatory Visit | Attending: Orthopedic Surgery | Admitting: Orthopedic Surgery

## 2010-12-16 DIAGNOSIS — Z01811 Encounter for preprocedural respiratory examination: Secondary | ICD-10-CM

## 2010-12-16 LAB — BASIC METABOLIC PANEL
BUN: 15 mg/dL (ref 6–23)
CO2: 25 mEq/L (ref 19–32)
Calcium: 9.2 mg/dL (ref 8.4–10.5)
Chloride: 106 mEq/L (ref 96–112)
Creatinine, Ser: 0.97 mg/dL (ref 0.4–1.2)
GFR calc Af Amer: >60 mL/min (ref >60)
GFR calc non Af Amer: 58 mL/min — ABNORMAL LOW (ref >60)
Glucose, Bld: 129 mg/dL — ABNORMAL HIGH (ref 70–99)
Potassium: 4.6 mEq/L (ref 3.5–5.1)
Sodium: 138 mEq/L (ref 135–145)

## 2010-12-19 ENCOUNTER — Ambulatory Visit (HOSPITAL_BASED_OUTPATIENT_CLINIC_OR_DEPARTMENT_OTHER)
Admission: RE | Admit: 2010-12-19 | Discharge: 2010-12-20 | Disposition: A | Discharge status: Home / Self Care | Payer: Medicare Other | Source: Ambulatory Visit | Attending: Orthopedic Surgery | Admitting: Orthopedic Surgery

## 2010-12-19 DIAGNOSIS — M11239 Other chondrocalcinosis, unspecified wrist: Secondary | ICD-10-CM | POA: Insufficient documentation

## 2010-12-19 DIAGNOSIS — K219 Gastro-esophageal reflux disease without esophagitis: Secondary | ICD-10-CM | POA: Insufficient documentation

## 2010-12-19 DIAGNOSIS — J449 Chronic obstructive pulmonary disease, unspecified: Secondary | ICD-10-CM | POA: Insufficient documentation

## 2010-12-19 DIAGNOSIS — G5600 Carpal tunnel syndrome, unspecified upper limb: Secondary | ICD-10-CM | POA: Insufficient documentation

## 2010-12-19 DIAGNOSIS — I252 Old myocardial infarction: Secondary | ICD-10-CM | POA: Insufficient documentation

## 2010-12-19 DIAGNOSIS — G5620 Lesion of ulnar nerve, unspecified upper limb: Secondary | ICD-10-CM | POA: Insufficient documentation

## 2010-12-19 DIAGNOSIS — M19039 Primary osteoarthritis, unspecified wrist: Secondary | ICD-10-CM | POA: Insufficient documentation

## 2010-12-19 DIAGNOSIS — Z01812 Encounter for preprocedural laboratory examination: Secondary | ICD-10-CM | POA: Insufficient documentation

## 2010-12-19 DIAGNOSIS — J4489 Other specified chronic obstructive pulmonary disease: Secondary | ICD-10-CM | POA: Insufficient documentation

## 2010-12-19 DIAGNOSIS — I10 Essential (primary) hypertension: Secondary | ICD-10-CM | POA: Insufficient documentation

## 2010-12-19 LAB — POCT HEMOGLOBIN-HEMACUE: Hemoglobin: 13.3 g/dL (ref 12.0–15.0)

## 2010-12-24 NOTE — Op Note (Signed)
NAME:  Jordan [name], Jordan [name]                 ACCOUNT NO.:  [id number]  MEDICAL RECORD NO.:  [id number]           PATIENT TYPE:  LOCATION:                                 FACILITY:  PHYSICIAN:  Youlanda Mighty. Nare Gaspari, M.D. DATE OF BIRTH:  11/01/1945  DATE OF PROCEDURE:  12/19/2010 DATE OF DISCHARGE:                              OPERATIVE REPORT   PREOPERATIVE DIAGNOSES: 1. 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 2. 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 3. Also, severe carpal tunnel syndrome. 4. 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  POSTOPERATIVE DIAGNOSES: 1. Matured scaphoid-lunate advanced collapse wrist deformity with bone-     on-bone arthropathy, midcarpal joint, primarily capitolunate     articulation and bone-on-bone arthropathy at proximal pole of     scaphoid and scaphoid facet of radius. 2. Severe left carpal tunnel syndrome. 3. 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 4. Probable chondrocalcinosis  OPERATIONS: 1. Exploration left radiocarpal and midcarpal joints with     identification of mature SLAC wrist deformity and probable     chondrocalcinosis. 2. Left wrist scaphoid excision. 3. Ulnar four-bone fusion with autogenous cancellous bone and 0.045-     inch Kirschner wire fixation x3. 4. In situ decompression of left ulnar nerve at cubital tunnel. 5. Left carpal tunnel release.  OPERATIONS:  Youlanda Mighty. Ashantia Amaral, MD  ASSISTANT:  Marily Lente Dasnoit, PA  ANESTHESIA:  General by LMA with a left brachial plexus block, supervised anesthesiologist is Jessy Oto. Albertina Parr, MD  INDICATIONS:  Jordan [name] is a 65 year old woman referred through the courtesy of Dr. Sinda Du of [place].  She had a history of severe left wrist pain and left hand and arm numbness.  Clinical examination suggested carpal tunnel syndrome, ulnar neuropathy at cubital tunnel, and x-rays of the wrist demonstrated  bone-on-bone arthropathy at the midcarpal joint.  Careful study of the films revealed a dorsiflexion posture of the lunate, suggesting a possible SLAC wrist although there was not an obvious instability at the scapholunate articulation.  We advised Jordan [name] to consider a midcarpal fusion versus a scaphoid excision and ulnar four-bone fusion depending on findings at surgery. We also recommended electrodiagnostic studies.  These were completed by Dr. Zebedee Iba on December 09, 2010, documenting very significant left carpal tunnel syndrome and a severe left ulnar neuropathy at cubital tunnel with a conduction velocity of 38.7 meters per second on the left versus 50 meters per second on the right.  After detailed informed consent, she was brought to the operating room at this time.  Preoperatively, her past medical history is reviewed in detail.  She has chronic obstructive airways disease and requires chronic prednisone therapy as well as multiple inhalers.  Our plan was to perform a surgery initially under block if possible, however, if her airways were irritable and coughing was a problem, general anesthesia by LMA technique was discussed in detail with Jordan [name] by Dr. Albertina Parr.  Ultimately, she was brought to room #1 of the [facility] and placed supine position on the operating table.  A  brachial plexus block placed by Dr. Albertina Parr in the holding area led excellent anesthesia of the left arm.  With light sedation, the left arm was prepped with Betadine soap solution, sterilely draped.  A pneumatic tourniquet was applied to the proximal left brachium.  Following exsanguination of the left arm with an Esmarch bandage, the arterial tourniquet was inflated to 250 mmHg.  Procedure commenced with a standard carpal tunnel incision in the palm.  Subcutaneous tissues were carefully divided revealing the palmar fascia.  This was split longitudinally to reveal the common sensory branch  of the median nerve.  The carpal canal was sound with a Penfield four elevator followed by isolation of the transverse carpal ligament.  With care, the transcarpal ligament released subcutaneously along its ulnar border into the distal forearm.  This widely opened carpal canal.  Bleeding points were electrocauterized with bipolar current followed by repair of the skin with intradermal 3-0 Prolene suture.  Attention was then directed to the medial elbow.  A 2-cm incision was fashioned posterior to the epicondyle.  The ulnar nerve was identified by palpation followed by careful unroofing the ulnar nerve by release of Osborne's band, the arcuate ligament, and fascia at the heads of flexor carpi ulnaris.  The brachial fascia was released 6 cm above the epicondyle and the flexor carpi ulnaris fascia was released 4 cm distally.  The nerve was palpated 6 cm above the elbow, 6 cm distally, and found be stable with elbow flexion extension 0 to 135 degrees.  We elected to perform an in situ decompression.  There appeared to be significant compression deep to the arcuate ligament.  This wound was inspected for bleeding points followed by repair of the skin with intradermal 3-0 Prolene and Steri-Strips.  A sterile gauze dressing with Tegaderm was applied.  Attention was then directed to the dorsal aspect of the wrist.  A 4-cm longitudinal incision was fashioned from the base of long finger metacarpal to Lister's tubercle ulnar aspect.  The extensor retinaculum was split and extensor tendon was retracted in ulnar direction.  At the floor of the fourth dorsal compartment, we identified the posterior interosseous nerve and resected a centimeter segment, performing posterior interosseous neurectomy for postoperative pain control.  A longitudinal capsulotomy was performed between the neck of the capitate and the scapholunate interosseous space.  Florid synovitis was noted and loose bodies extruded  dorsally.  The loose bodies removed followed by inspect of the scapholunate ligament.  There was obvious chondrocalcinosis noted and a complete rupture of the scapholunate ligament.  This was a Radio broadcast assistant with loss of hyaline articular cartilage on the proximal pole of the scaphoid, the proximal pole of capitate, and bone-on-bone arthropathy at the midcarpal joint.  At this point, the decision was made to convert our surgical plan to a scaphoid excision and ulnar four-bone fusion.  The scaphoid was removed piecemeal and synovectomy accomplished.  All areas of chondrocalcinosis visible were debrided.  The articular surfaces of the capitate, hamate, lunate, and triquetrum were denuded of remaining hyaline cartilage followed by feathering with an osteotome and use of a curette to create bleeding bone surface.  The scaphoid fragments were cleared of hyaline cartilage, morselized, and placed in as an intercalated cancellous graft.  The lunate was reduced on the capitate and the midcarpal joint placed in a neutral position followed by placement of three 0.045-inch Kirschner wires, securing the capitate, lunate, hamate, and triquetrum in the proper position.  The pins were prepared in the  usual manner.  The joint was then irrigated and redundant bone graft removed.  The capsule of the wrist joint was then repaired with figure-of-eight sutures of 3-0 Ethibond. The extensor retinaculum repaired with figure-of-eight sutures of 3-0 Ethibond and the skin repaired with 4-0 Vicryl subcutaneous sutures and intradermal 3-0 Prolene with Steri-Strips.  For aftercare, Jordan [name] was placed in a sugar-tong splint.  We will observe her pulmonary function postop.  If she has any difficulties with her O2 sat or pulmonary function, we will observe her for 24 hours overnight.  She will be discharged home with the care of her family with instructions to use Dilaudid 2 mg 1 p.o. q.4-6 hours p.r.n. pain, she  is provided 30 tablets with 1 refill.  Also, she is provided Keflex 500 mg 1 p.o. q.8 hours x4 days as a prophylactic antibiotic.  As she takes prednisone on a routine basis, she will take prednisone 10 mg p.o. b.i.d. x3 days and resume her usual dose thereafter.     Youlanda Mighty Reznor Ferrando, M.D.     RVS/MEDQ  D:  12/19/2010  T:  12/20/2010  Job:  [id number]  cc:   Percell Miller L. Luan Pulling, M.D.  Electronically Signed by Theodis Sato M.D. on 12/24/2010 09:12:49 AM

## 2011-01-21 NOTE — H&P (Signed)
NAME:  Jordan [name], CEDERQUIST                 ACCOUNT NO.:  [id number]   MEDICAL RECORD NO.:  [id number]          PATIENT TYPE:  OBV   LOCATION:  A331                          FACILITY:  [facility]   PHYSICIAN:  Edward L. Luan Pulling, M.D.DATE OF BIRTH:  1946-07-14   DATE OF ADMISSION:  05/20/2007  DATE OF DISCHARGE:  LH                              HISTORY & PHYSICAL   Ms. [name] is a 65 year-old with a history of abdominal pain for the last  12-14 hours.  She has had a previous laparoscopic Nissen fundoplication,  then had a redo done at [facility].  She has had a number of other medical  problems.  She has a history of asthma.  She had respiratory failure  that required intubation.  She has had a previous myocardial infarction.  She had Clostridium difficile colitis with severe abdominal pain  associated with that but she says this does not feel like her C.  difficile.  She has a history of hypertension, a history of osteoporosis  and depression, and she had an MI during her severe illness with her  respiratory failure, intubation and mechanical ventilation.   SURGICAL HISTORY:  1. Oophorectomy.  2. C-section.  3. Nissen fundoplication x2.  4. Concussion about 2 years ago.  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  6. Arthroscopic knee surgery.   SOCIAL HISTORY:  She is divorced.  She is retired.  She uses alcohol  occasionally.  Tobacco very rarely.   FAMILY HISTORY:  Positive for COPD.   REVIEW OF SYSTEMS:  Other than as mentioned is negative.   PHYSICAL EXAMINATION:  GENERAL:  She is awake and alert.  She was having  much more abdominal distress when she was in my office.  HEENT:  Her pupils are reactive to light and accommodation.  Her nose  and throat are clear.  NECK:  Supple without masses.  CHEST:  Clear without wheezes, rales, or rhonchi.  HEART:  Regular without murmur, gallop or rub.  ABDOMEN:  Soft.  No masses are felt.  Her abdomen is very tender in the  right upper  quadrant.  Bowel sounds are present and active.  EXTREMITIES:  No edema.  CNS: Grossly intact.   ASSESSMENT:  She has severe abdominal pain.  She has not able to vomit  but I think the reason that she cannot vomit is because she has had the  fundoplication x2.   PLAN:  Plan then is to try to get her x-rays done to be sure she is not  obstructed.  I think this is much more likely cholecystitis.  Her  symptoms were precipitated by eating oyster stew.  Depending on the  results of her labs, etc., we will go ahead and have her reevaluated and  perhaps even a surgical contact.      Edward L. Luan Pulling, M.D.  Electronically Signed     ELH/MEDQ  D:  05/20/2007  T:  05/21/2007  Job:  [id number]

## 2011-01-21 NOTE — Discharge Summary (Signed)
NAME:  [name], [name]                 ACCOUNT NO.:  [id number]   MEDICAL RECORD NO.:  [id number]          PATIENT TYPE:  OBV   LOCATION:  A331                          FACILITY:  [facility]   PHYSICIAN:  Edward L. Luan Pulling, M.D.DATE OF BIRTH:  1946-07-08   DATE OF ADMISSION:  05/20/2007  DATE OF DISCHARGE:  LH                               DISCHARGE SUMMARY   FINAL DISCHARGE DIAGNOSES:  1. Acute gastroenteritis.  2. Gaseous distention of the abdomen related to #1.  3. Asthma.  4. Chronic obstructive pulmonary disease.  5. Status post myocardial infarction.  6. Status post severe episode of Clostridium difficile-associated      colitis.  7. Gastroesophageal reflux disease.  8. Osteoporosis.  9. Hypertension.  10.Depression.   HISTORY:  [name] is a 65 year old who came to my office complaining  of severe abdominal pain, nausea but inability to vomit, and diarrhea.  She said that this all started after she had eaten oyster stew.  She has  had a Nissen fundoplication of her esophagus x2 and she is not able to  vomit because of that.  When I saw in my office she was markedly  distended, very tender.  She was brought in for hospital observation,  had lab work which was normal, a flat and upright abdominal film which  showed marked gaseous distention of her abdomen, and I asked for Dr.  Arnoldo Morale to see her with concern that she was developing a toxic  megacolon, but before Dr. Arnoldo Morale came - although it was within about an  hour - she had marked flatus and was markedly improved.  She had CT scan  which showed no acute changes and by the next morning she was greatly  improved, back to baseline, hungry, so I advanced her diet and as she  did well she is discharged home back on her regular medications which  are:  1. Verapamil SR 240 daily.  2. Advair 250/50 one puff b.i.d.  3. Spiriva one inhalation daily.  4. Acidophilus that she uses daily.  5. Aspirin 81 mg daily.  6. AcipHex 20 mg  daily.  7. Lipitor 20 mg daily.  8. Verapamil 240 mg daily.  9. Prozac 40 mg daily.  10.Singulair 10 mg daily.   She is going to follow up in my office.  She is going to need treatment  for her osteoporosis and I am going to see if we can get her started on  Forteo.  I think that will be by far the best method of treating her,  considering her reflux problems.      Edward L. Luan Pulling, M.D.  Electronically Signed     ELH/MEDQ  D:  05/21/2007  T:  05/21/2007  Job:  [id number]

## 2011-01-21 NOTE — Group Therapy Note (Signed)
NAME:  Jordan [name], JACOME                 ACCOUNT NO.:  [id number]   MEDICAL RECORD NO.:  [id number]          PATIENT TYPE:  OBV   LOCATION:  A331                          FACILITY:  [facility]   PHYSICIAN:  Edward L. Luan Pulling, M.D.DATE OF BIRTH:  1946/05/11   DATE OF PROCEDURE:  05/21/2007  DATE OF DISCHARGE:                                 PROGRESS NOTE   Ms. Rayle, when she was admitted yesterday, had remarkable amount of gas  in her abdomen.  I asked for Dr. Arnoldo Morale to see her, was worried that  she was developing a toxic colon but she had a large amount of gas  passed through the night, had a CT scan that was normal with no acute  changes, and she is back to baseline this morning.  Says she feels well,  she is hungry, she wants to eat.   EXAM:  Otherwise shows her is temperature is 97, pulse 75, respirations  20, blood pressure 146/83, O2 sats 97% on room air.  Her lab work was in  essence normal.   ASSESSMENT:  Her abdomen is flat, soft.  Bowel sounds present and  active.   ASSESSMENT:  She is much improved.  I suspect that this may have been an  episode of gastroenteritis or even food poisoning related to her oyster  stew.   PLAN:  To go ahead and advanced her diet, if she does well I will  discharge her later.      Edward L. Luan Pulling, M.D.  Electronically Signed     ELH/MEDQ  D:  05/21/2007  T:  05/21/2007  Job:  [id number]

## 2011-01-24 NOTE — Op Note (Signed)
NAME:  Jordan [name], Jordan [name]                           ACCOUNT NO.:  [id number]   MEDICAL RECORD NO.:  [id number]                   PATIENT TYPE:  INP   LOCATION:  3004                                 FACILITY:  [facility]   PHYSICIAN:  Ophelia Charter, M.D.            DATE OF BIRTH:  1946/04/17   DATE OF PROCEDURE:  10/26/2002  DATE OF DISCHARGE:                                 OPERATIVE REPORT   PREOPERATIVE DIAGNOSIS:  Pseudomeningocele.   POSTOPERATIVE DIAGNOSIS:  Pseudomeningocele.   PROCEDURE:  Repair of pseudomeningocele.   SURGEON:  Ophelia Charter, M.D.   ANESTHESIA:  General endotracheal.   ESTIMATED BLOOD LOSS:  Minimal.   SPECIMENS:  Cultures.   DRAINS:  None.   COMPLICATIONS:  None.   BRIEF HISTORY:  The patient is a 65 year old white female who I performed  anterior cervical diskectomy from C5 to C7 and then a C4-5 anterior cervical  diskectomy with fusion and plating on about a week ago.  She subsequently  presented with a fluid mass under her skin and it was felt to be a CSF fluid  collection.  I have recommended we explore her wound and repair the CSF  leak/pseudomeningocele.   DESCRIPTION OF PROCEDURE:  The patient was brought to the operating room by  the anesthesia team.  General endotracheal anesthesia was induced, and the  patient remained in supine position.  A roll was placed under her shoulders  to place her neck in slight extension.  Her anterior cervical region was  then prepared with Betadine scrub and Betadine solution and sterile drapes  were applied.  I then incised the patient's fresh surgical incision using a  15 blade scalpel and used the Metzenbaum scissors to cut the subcutaneous  Vicryl as well as the Vicryl in the platysma muscle. In doing this there was  a lot of clear spinal fluid.  I obtained cultures, but there was no obvious  evidence of gross infection.  The tissues easily were separated.  I then  dissected down toward the anterior  cervical spine and carefully identified  the esophagus and retracted it medially.  I exposed the plate at [id number] and  then unlocked the cams and then removed the screws and removed the plate and  then inserted the Caspar self-retaining retractor for exposure and then  inserted the vertebral body spreader into the interspace, distracted the C4-  5 interspace, and then removed the bone graft, entering the interspace.  There was some small amount of blood clot in the interspace, which I removed  with suction, and immediately came down upon a pouch of arachnoid right in  the dead center of the interspace.  I then laid a piece of Duragen over this  arachnoid pouch and then covered this with Tisseel and then there was no  further evidence of spinal fluid leakage, and then replaced the patient's  bone graft into the  distracted interspace, removed the distraction, and  noted the bone graft had a good, snug fit.  I then replaced the patient's  Codman plate and screws in the prior holes, locked the screws in place, and  then obtained stringent hemostasis with bipolar electrocautery.  I copiously  irrigated the wound out with kanamycin solution and then removed the Caspar  self-retaining retractor and the antibiotic solution and then reapproximated  the patient's platysma muscle with interrupted 3-0 Vicryl suture and the  subcutaneous tissue with interrupted 3-0 Vicryl and the skin with Steri-  Strips and Benzoin.  The wound was then coated with bacitracin ointment, a  sterile dressing was applied, the drapes were removed.  The patient was  subsequently extubated by the anesthesia team and transported to the  postanesthesia care unit in stable condition.  All sponge, instrument, and  needle counts were correct at the end of this case.                                               Ophelia Charter, M.D.    JDJ/MEDQ  D:  10/26/2002  T:  10/27/2002  Job:  [id number]

## 2011-01-24 NOTE — Op Note (Signed)
[place]. [facility]  Patient:    Jordan [name], Jordan [name]                        MRN: [id number] Proc. Date: 10/28/00 Adm. Date:  [id number] Attending:  Newman Pies D                           Operative Report  PREOPERATIVE DIAGNOSES: 1. C5-6, C6-7 herniated nucleus pulposus, spondylosis, spinal stenosis. 2. Left carpal tunnel syndrome.  POSTOPERATIVE DIAGNOSES: 1. C5-6, C6-7 herniated nucleus pulposus, spondylosis, spinal stenosis. 2. Left carpal tunnel syndrome.  PROCEDURES: 1. C5-6 and C6-7 extensive anterior cervical diskectomy, anterior interbody    iliac crest allograft arthrodesis, anterior plating, C5 to C7 (Codman    anterior titanium plate and screws). 2. Left carpal tunnel release (left median nerve neurolysis at the wrist).  SURGEON:  Ophelia Charter, M.D.  ASSISTANT:  Elizabeth Sauer, M.D.  ANESTHESIA:  General endotracheal.  ESTIMATED BLOOD LOSS:  150 cc.  SPECIMENS:  None.  DRAINS:  None.  COMPLICATIONS:  None.  BRIEF HISTORY:  The patient is a 65 year old white female who suffers from neck pain with pain radiating to her left arm and numbness in her left hand. She was worked up with EMGs, which demonstrated left carpal tunnel syndrome, as well as a cervical MRI, which demonstrates significant herniated nucleus pulposus at C6-7 and significant spondylosis at C5-6.  I have discussed the various treatment options with her, including surgery.  The patient failed medical management and therefore weighed the risks, benefits, and alternatives of surgery and decided to proceed with a left carpal tunnel release as well as a C5-6 and C6-7 anterior cervical diskectomy and fusion and plating.  DESCRIPTION OF PROCEDURE:  The patient was brought to the operating room by the anesthesia team.  General endotracheal anesthesia was induced.  The patient remained in supine position.  Her left hand and arm were prepared with Betadine scrub and Betadine  solution.  Sterile drapes were applied.  I then injected the area to be incised with Marcaine solution and used a 15 blade scalpel to make incision along the patients palmar crease.  I used a 15 blade scalpel to dissect down through the superficial fascia.  I divided it and then slowly cut deeper with the 15 blade scalpel, cutting through the transverse carpal ligament.  I identified the median nerve, and then I used the Metzenbaum scissors and scalpel to divide the transverse carpal ligament on the ulnar aspect of the median nerve both proximally and distally.  I used the Sims retractor to look both proximally and distally and obtained a complete transection of the transverse carpal ligament, i.e., good decompression of the median nerve.  I then irrigated the wound with saline solution and then achieved hemostasis using bipolar electrocautery.  I then reapproximated the patients skin with a running 3-0 nylon suture.  The wound was then covered with bacitracin ointment, and a sterile dressing was applied.  A roll was then placed under the patients shoulders to place her neck in slight extension.  The anterior cervical region was then prepared with Betadine scrub and Betadine solution, and sterile drapes were applied.  I then injected the area to be incised with Marcaine with epinephrine solution, and I used a scalpel to make a transverse incision in the patients left anterior neck.  I used Metzenbaum scissors to dissect down to the platysma  muscle.  I divided this muscle along the direction of skin incision.  I then dissected medial to the sternocleidomastoid muscle, jugular vein, and carotid artery, bluntly dissecting down to the anterior cervical spine.  I then identified the esophagus and carefully retracted it medially.  I cleared soft tissue from the exposed interspaces with the Kitner swabs.  I then inserted a bent spinal needle into the upper exposed interspace and obtained an  intraoperative radiograph to confirm our location.  I then used electrocautery to detach the medial border of the longus colli muscle bilaterally from the C5-6 and C6-7 intervertebral disk space.  I inserted the Caspar self-retaining retractor for exposure and then incised the C6-7 intervertebral disk and performed a partial diskectomy with the pituitary forceps and the Carlin curettes.  I inserted distraction screws in C6 and C7 and distracted the interspace and then used the high-speed drill to decorticate the vertebral end plates at D34-534 and drill away the remainder of the C6-7 intervertebral disk.  I thinned out the posterior longitudinal ligament with the drill and incised it with the arachnoid knife.  I removed the ligament with the Kerrison punch, undercutting the vertebral end plate at D34-534, decompressing the thecal sac.  I performed a foraminotomy about the left C7 nerve root.  Of note, there was a herniated disk on the left, which I removed with a combination of the pituitary forceps and the Kerrison punches.  I then performed a foraminotomy about the right C7 nerve root.  At this point, I had good decompression of the thecal sac at C6-7 and bilateral C7 nerve roots.  I then turned my attention to C5-6.  I removed the distraction screw from C7 and placed it in C5, distracted the interspace, and then incised the interspace with a 15 blade scalpel.  There was quite a bit of spondylosis at this level.  I used the high-speed drill to drill away some anterior osteophytes and then drill away the remainder of the intervertebral disk as well as decorticate the vertebral end plates at 075-GRM. There were large bone spurs both in a cephalad and caudal direction.  I drilled them away with the drill and then thinned out the posterior longitudinal ligament.  I incised the ligament with the arachnoid knife and then removed the ligament with the Kerrison punch, undercutting the vertebral end plates  at 075-GRM.  I decompressed the thecal sac and then performed a foraminotomy about the bilateral C6 nerve root.  Having completed the extensive anterior diskectomy at C5-6 and C6-7, I now  turned my attention to the anterior interbody iliac crest allograft arthrodesis.  I obtained the bone graft and fashioned them to these approximate dimensions:  7 mm in height and 1 cm in depth.  I inserted one bone graft in the distracted C5-6 interspace and removed the distraction screw from C5, placed it back in C7, distracted C7, and put the other bone graft into C6-7.  I removed the distraction screws and did get a snug fit of bone graft at both levels.  Having completed the anterior spinal arthrodesis, I now turned my attention to the anterior spinal instrumentation.  I obtained the appropriate length Codman anterior cervical plate.  I drilled away the anterior osteophytes from the vertebral end plates at 075-GRM and D34-534.  I laid the plate along the anterior aspect of the vertebral bodies from C5 to C7, drilled two holes at C5, two at C6, and two at C7.  I then tapped the  holes and inserted 12 mm screws at each level, i.e., two at each level.  I obtained an intraoperative radiograph that demonstrated one of the screws was somewhat cephalad in direction, and I redirected it down more caudally, i.e., screw in C5.  I then secured the screws to the plate using the cam tightener.  I then copiously irrigated the wound with bacitracin solution.  I removed the Caspar self-retaining retractor after achieving stringent hemostasis with bipolar electrocautery and inspected the esophagus for any damage, and there was none.  I then reapproximated the patients platysma muscle with interrupted 3-0 Vicryl suture, the subcutaneous tissue with interrupted 3-0 Vicryl suture, and the skin with Steri-Strips and benzoin.  The wound was then coated with bacitracin ointment, a sterile dressing was applied, the drapes were  removed, and the patient was subsequently extubated by the anesthesia team and transported to the postanesthesia care unit in stable condition.  All sponge, instrument, and needle counts were correct at the end of this case. DD:  10/29/00 TD:  10/30/00 Job: [id number]

## 2011-01-24 NOTE — Group Therapy Note (Signed)
NAME:  Jordan [name], Jordan [name]                 ACCOUNT NO.:  [id number]   MEDICAL RECORD NO.:  [id number]          PATIENT TYPE:  INP   LOCATION:  A223                          FACILITY:  [facility]   PHYSICIAN:  Edward L. Luan Pulling, M.D.DATE OF BIRTH:  May 05, 1946   DATE OF PROCEDURE:  08/03/2006  DATE OF DISCHARGE:                                 PROGRESS NOTE   Ms. Garfield is overall improved but still having significant coughing and  wheezing episodes every morning.  She otherwise feels generally better.  She does not have any new complaints.   Her exam today shows her temperature is 97.1, pulse 78, respirations 18,  blood sugar 178, blood pressure 135/76, O2 saturations 90% on 2 liters.  Her chest is with wheezes and rhonchi bilaterally.   ASSESSMENT:  1. She has asthma/chronic obstructive pulmonary disease with a      previous episode of respiratory failure, but it is somewhat      improved.  2. She has glucose intolerance made worse by her steroids, which is      fairly well-controlled.  3. She has a history of a cardiac event that seem to have been related      to her severe lung dysfunction, unchanged and with no new episodes.  4. She has had supraventricular tachycardia, which is well-controlled      on Cardizem.   PLAN:  Continue with current treatments and follow.  I want to  discontinue her IV fluids and try to get her up and moving and see if it  will help some with the cough and wheeze.      Edward L. Luan Pulling, M.D.  Electronically Signed     ELH/MEDQ  D:  08/03/2006  T:  08/03/2006  Job:  [id number]

## 2011-01-24 NOTE — Group Therapy Note (Signed)
Jordan [name], Jordan [name]                 ACCOUNT NO.:  [id number]   MEDICAL RECORD NO.:  [id number]          PATIENT TYPE:  INP   LOCATION:  A303                          FACILITY:  [facility]   PHYSICIAN:  Jordan [name], M.D.        DATE OF BIRTH:  [birth year]   DATE OF PROCEDURE:  11/15/2005  DATE OF DISCHARGE:                                   PROGRESS NOTE   PROBLEM:  Clostridium difficile colitis.   SUBJECTIVE:  Jordan [name] says she is doing better, less pain, she continues to  have lower extremity swelling.  She has had three loose bowel movements a  day but feels they are a little more formed at this time.  Denies any  significant pain.  No nausea.   OBJECTIVE:  VITAL SIGNS:  Temperature is 97.3, blood pressure is 157/89,  pulse 89, respirations are 20, she is 95% on room air.  GENERAL:  This is a white female lying in bed in no acute distress.  CHEST:  Clear without wheezes.  Heart is regular rate and rhythm, no  murmurs, gallops, or rubs.  ABDOMEN:  Protuberant but soft, nontender.  EXTREMITIES:  1+ lower extremity edema up past the knee.   LABORATORY WORK:  No new lab work attained at this time.   ASSESSMENT:  She is much improved.   PLAN:  Problem 1. CLOSTRIDIUM DIFFICILE.  She is stopped on the vancomycin  enemas per Dr. Gala [name].  She is to continue on the vancomycin and Flagyl for  the next day or two.  She is also to continue on the Questran.  She is  advancing to a regular diet at this time.   Problem 2. LOWER EXTREMITY EDEMA.  We will decrease her IV fluids to [facility] at  this time.  She was given a dose of Lasix to help get some of this fluid  off.  We will give another dose of Lasix today and recheck her lab work  tomorrow.   Problem 3. HYPERTENSION PROBABLY WORSENED BY THE STRESS AND DUE TO THE  INTRAVENOUS FLUIDS AS WELL.  She is on lisinopril daily for this and should  improve following this.   Problem 4. CHRONIC OBSTRUCTIVE PULMONARY DISEASE.  She is to continue on her  inhalers as previously prescribed.   Problem 5. ORAL THRUSH.  She still has small patches apparent on her tongue.  We will continue with the Mycelex troches four times a day for at least a 14-  day course.   DISPOSITION:  The patient is doing much better.  We will continue to get her  up out of bed and get physical therapy involved with her, advance her diet  and see how she does.      Jordan [name], M.D.  Electronically Signed     ZH/MEDQ  D:  11/15/2005  T:  11/16/2005  Job:  [id number]   cc:   Jordan Miller L. Jordan [name], M.D.  Fax: [phone]

## 2011-01-24 NOTE — Group Therapy Note (Signed)
NAME:  Jordan [name], Jordan [name]                 ACCOUNT NO.:  [id number]   MEDICAL RECORD NO.:  [id number]          PATIENT TYPE:  INP   LOCATION:  A303                          FACILITY:  [facility]   PHYSICIAN:  Edward L. Luan Pulling, M.D.DATE OF BIRTH:  [birth year]   DATE OF PROCEDURE:  11/06/2005  DATE OF DISCHARGE:                                   PROGRESS NOTE   SUBJECTIVE:  [name] says she is feeling a little better.  She continues  to have significant problems with her abdomen and still having some  diarrhea. Otherwise, she says everything is about the same.  She is not  having any shortness of breath and no symptoms from her asthma.   OBJECTIVE:  GENERAL:  Her physical examination today shows that she looks a  little better.  VITAL SIGNS:  Her blood pressure 119/68, pulse 107, respirations 20,  temperature is 98.  CHEST:  Her chest is clear.  ABDOMEN:  Her abdomen is still somewhat protuberant.   We are still awaiting stool culture and CT.   ASSESSMENT/PLAN:  At this point, I think she does indeed probably have  Clostridium difficile, but we cannot prove that.  She is going to stay on  the Flagyl by mouth as per Dr. Gala Romney.  Stay on clear liquid diet.  Continue  on her other medications and continue IV fluids, etc.  She is going to stay  on her inhalers for her asthma which is under pretty good control now.      Edward L. Luan Pulling, M.D.  Electronically Signed     ELH/MEDQ  D:  11/06/2005  T:  11/06/2005  Job:  [id number]

## 2011-01-24 NOTE — H&P (Signed)
[place]. [facility]  Patient:    SAVITRI, PETROW                        MRN: [id number] Adm. Date:  [id number] Attending:  Newman Pies D                         History and Physical  CHIEF COMPLAINT: Left arm pain.  HISTORY OF PRESENT ILLNESS: The patient is a 65 year old white female who I had seen back in 1999 secondary to neck pain, treated successfully with medications.  She did well until approximately two months ago when she began having severe severe pain in her left arm.  She saw Dr. Clifton James and was evaluated with [facility] EMGs, which demonstrated left carpal tunnel syndrome, and Dr. Clifton James also suspected cervical radiculopathy and sent her for a cervical lumbar MRI, which demonstrated herniated disk.  The patient was kindly sent for my consultation.  The patient complains of neck pain, with pain radiating down her left arm. She has numbness in her left thumb, index and middle finger, and has had weakness in her left arm.  She has used a ______ and this did not help. DD:  10/28/00 TD:  10/28/00 Job: [id number]

## 2011-01-24 NOTE — Consult Note (Signed)
[place]. [facility]  [name]:    Jordan [name], Jordan [name]                        MRN: [id number] Proc. Date: 10/28/00 Adm. Date:  [id number] Attending:  Newman Pies D                          Consultation Report  REFERRING PHYSICIAN:  Ophelia Charter, M.D.  INDICATION FOR CONSULTATION:  Chest pain.  HISTORY:  Jordan [name] is a pleasant 65 year old female who was admitted for repair of left carpal tunnel syndrome and C5-6-7 anterior cervical diskectomy. On her first postoperative day she was noted to develop a sudden onset of left upper chest wall pain which was abrupt in nature.  Jordan duration of Jordan chest pain was 20 minutes.  Jordan chest pain was relieved with 1 sublingual nitroglycerin.  Jordan [name] had no nausea, no vomiting, no diaphoresis.  She did feel as if she was unable to get a deep breath.  Jordan [name] has been treated in Jordan past for gastroesophageal reflux symptoms but this has not been a issue with Jordan recent nissen procedure in 1998.  Her activity is limited primarily only secondary to shortness of breath to approximately 1/2 mile. She reportedly had a stress cardiolite performed by Dr. Luan Pulling in 2001, and was reportedly normal.  Her coronary risk factors are significant for a dyslipidemia, hypertension. Her grandfather had a myocardial infarction at age 32.  Her father died at age 66 from congestive heart failure, questionable coronary artery disease in his early 101s.  Jordan [name] is a nonsmoker and has no history of diabetes.  PAST MEDICAL HISTORY: 1. Significant for hypertension. 2. ASthma. 3. Dyslipidemia. 4. Paroxysmal atrial tachycardia.  This has reportedly been treated with    digoxin and she has had no further arrhythmia in Jordan past 8 years. 5. Depression. 6. Gastroesophageal reflux disease. 7. Benign ovarian cyst.  PAST SURGICAL HISTORY: 1. Significant for appendectomy on May 18, 1964. 2. Arthroscopic knee surgery 1985,  1987 and 1989. 3. Open knee surgery in 1992. 4. Nissen procedure in 1998. 5. Oophorectomy in January of 2000.  CURRENT MEDICATIONS:  Include: 1. Prozac 40 p.o. q. day. 2. Lanoxin 0.25 q. day. 3. Evista 60 mg p.o. q. day. 4. Singulair 10 mg p.o. q. day. 5. Accupril 20 q. day. 6. Lipitor 20 q. day. 7. Advair 1 puff b.i.d.  ALLERGIES:  SULFA and BARBITUATES.  SOCIAL HISTORY:  Jordan [name] has been married for 20 years.  She has no children.  She lives in [place].  She is employed as a Product/process development scientist.  She is not employed.  She is not involved in a regular exercise program.  No history of tobacco use or drug use.  Alcohol use is social only.  FAMILY HISTORY:  As noted above.  Mother also has significant osteoporosis and asthma.  REVIEW OF SYSTEMS:  Jordan [name] states she has had no orthopnea and no PND, and no tachyarrhythmia.  Her reflux symptoms have been under control with Jordan nissen procedure as noted above.  ACTIVITY:  Her activity is limited by shortness of breath.  Jordan recent stress cardiolite:  Jordan [name] reports she had poor exercise tolerance and was unable to complete Jordan stress portion of Jordan cardiolite. There was reportedly no ischemia at work load produced.  Indication for Jordan stress Cardiolite was dyspnea.  PHYSICAL EXAMINATION:  GENERAL:  Reveals a pleasant well-developed female in no acute distress currently wearing a cervical collar and left arm is bandaged.  VITAL SIGNS: Reveals her systolic pressure has ranged from 136-144.  Diastolic pressure is 123XX123.  Heart rate has ranged 94 to 102.  She currently is not on telemetry monitoring.  HEENT:  Remarkable for a cervical collar in place.  This was not removed for examination.  PULMONARY:  Reveals breath sounds which are equal and clear to auscultation. No use of accessory muscle.  No crackles are noted.  CARDIOVASCULAR:  Reveals a regular rate and rhythm.  There is a wide S1.  PMI is  within normal limits.  ABDOMEN:  Soft and nontender.  No hepatosplenomegaly.  No unusual bruits are noted.  EXTREMITIES;  Reveal no obvious deformity.  No peripheral edema is noted. Distal pulses are palpable.  NEUROLOGIC:  Nonfocal.  Jordan [name] is alert and oriented, asks appropriate questions.  ECG was reviewed and reveals a right bundle-branch block sinus rhythm.  This is unchanged from Jordan patients baseline.  Her white count is 5.2, hematocrit 35.7, platelet count is 269.  Normal creatinine, normal potassium.  IMPRESSION: 1. CHEST PAIN:  Atypical for cardiac, associated with an unchanged    electrocardiogram however, Jordan [name] has significant risk factors    including dyslipidemia, hypertension, advanced age and family history.  As    such an adenosine cardiolite will be performed.  It is noted Jordan [name]    has had a previous cardiolite but did not achieve her target heart rate.  2. HYPERTENSION:  Jordan patients blood pressure is currently controlled on her    current regimen, Accupril.  3. PAROXYSMAL ATRIAL TACHYCARDIA:  Jordan [name] currently is on Lanoxin 0.25 q.    day.  It is unlikely that this is having benefit on Jordan patients    paroxysmal atrial tachycardia but we will continue it, since she has been    on it for such a long period of time.  Jordan [name] will be transferred to    telemetry for further monitoring. DD:  10/28/00 TD:  10/29/00 Job: [id number]

## 2011-01-24 NOTE — Group Therapy Note (Signed)
NAME:  Jordan [name], Jordan [name]                 ACCOUNT NO.:  [id number]   MEDICAL RECORD NO.:  [id number]          PATIENT TYPE:  INP   LOCATION:  A303                          FACILITY:  [facility]   PHYSICIAN:  Edward L. Luan Pulling, M.D.DATE OF BIRTH:  [birth year]   DATE OF PROCEDURE:  11/12/2005  DATE OF DISCHARGE:                                   PROGRESS NOTE   PROBLEM LIST:  1.  Clostridium difficile colitis.  2.  Asthma, status post episode of respiratory failure.   SUBJECTIVE:  Ms. Belo says she still does not feel very well.  She still  has a lot of abdominal cramping and bloating.   OBJECTIVE:  VITAL SIGNS:  Her physical exam shows a temperature is 98, pulse  80, respirations 22, blood pressure 142/88, O2 saturations 96% on room air.   Her white count is 15,200, hemoglobin 11.3.  BMET pending.   ASSESSMENT:  She is a little better.  Still having significant problems with  her abdomen.   PLAN:  I am going to go ahead and get a flat and upright abdominal film.  I  am going to give her some Lasix today because she has edema. Check a BMET  and follow.      Edward L. Luan Pulling, M.D.  Electronically Signed     ELH/MEDQ  D:  11/12/2005  T:  11/13/2005  Job:  [id number]

## 2011-01-24 NOTE — Op Note (Signed)
NAME:  Jordan [name], Jordan [name]                 ACCOUNT NO.:  [id number]   MEDICAL RECORD NO.:  [id number]          PATIENT TYPE:  AMB   LOCATION:  DAY                           FACILITY:  [facility]   PHYSICIAN:  R. Garfield Cornea, M.D. DATE OF BIRTH:  [birth year]   DATE OF PROCEDURE:  04/07/2006  DATE OF DISCHARGE:                                 OPERATIVE REPORT   PROCEDURE PERFORMED:  Esophagogastroduodenoscopy with Bravo placement.   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  She saw Dr. Redmond Baseman down in [place] recently,  who felt that she had some changes around her vocal cords consistent with  LPR.  This lady has a history of GERD and has had laparoscopic Nissen  fundoplication here and a redo down at [facility] a few years ago.  She has been  on acid suppression for two weeks now.  Dr. Redmond Baseman asked [place] to restudy her  reflux via Bravo.  This, indeed, is being done today.  This approach has  been discussed with the patient at length, potential risks, benefits and  alternatives have been reviewed and questions answered.  The patient is  agreeable.  Please see documentation in the medical record.   Oxygen saturations, blood pressure, pulse and respirations were monitored  throughout the entirety of the procedure.   CONSCIOUS SEDATION:  IV Versed and Demerol in incremental doses.  Cetacaine  spray for topical pharyngeal anesthesia.   INSTRUMENT USED:  Olympus video chip system.   FINDINGS:  Examination of the tubular esophagus revealed no mucosal  abnormalities.  Esophagogastric junction was found to be at 40 cm with the  stomach inflated and 41 cm with the stomach deflated.  Thorough examination  of the gastric mucosa including a retroflex view of the proximal stomach and  esophagogastric junction demonstrated some alteration of the esophagogastric  junction but I did not see a typical wrap which is seen consistent with a  Nissen  fundoplication.  The gastric mucosa otherwise appeared normal.   Duodenum:  The bulb and second portion revealed no abnormalities.   THERAPY/DIAGNOSTIC MANEUVERS PERFORMED:  Scope was removed.  Bravo pH probe  catheter was placed at 34.5 cm from the incisors.  It was deployed using  standard technique.  Endoscopically, the probe was confirmed to be attached  to the mucosa satisfactorily.  The patient tolerated the procedure well was  reacted in endoscopy.   IMPRESSION:  Normal esophagus, status post placement of Bravo pH probe  placement.  Examination of the fundus/cardia revealed some surgical changes  but I did not see a fundoplication wrap that is typically seen after  antireflux surgery.  Her wrap may have slipped.   RECOMMENDATIONS:  We will study her esophagus for the next 48 hours.  She is  not to consume any acid suppressive agents during this time.  Will be  instructed on keeping a diary.  Further recommendations to follow.      Bridgette Habermann, M.D.  Electronically Signed     RMR/MEDQ  D:  04/07/2006  T:  04/07/2006  Job:  [id number]   cc:   Jasper Loser. Luan Pulling, M.D.  Fax: [phone]   Onnie Graham, MD  Fax: [phone]
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
## 2011-01-24 NOTE — Discharge Summary (Signed)
[place]  Patient:    Jordan [name], Jordan [name]                        MRN: [id number] Adm. Date:  [id number] Disc. Date: [id number] Attending:  Newman Pies D                           Discharge Summary  For full details of this admission, please refer to typed history and physical.  BRIEF HISTORY:  The patient is an 65 year old, white female, who suffers from neck pain with pain radiating into her left arm and numbness in her left hand. She was worked up with EMGs which demonstrated a left carpal tunnel syndrome as well as a cervical MRI which demonstrated significant herniated nucleus pulposus at C6-7 and significant spondylosis at C5-6. I have discussed the various treatment options with her including surgery.  The patient failed medical management, therefore she weighed the risks, benefits and alternatives of surgery.  It was decided to proceed with a left carpal tunnel release as well as a C5-6 and C6-7 anterior cervical diskectomy and fusion and plating.  For past medical history, past surgical history, medication prior to admission, drug allergies, family medical history, social history, admission physical examination, imaging studies, assessment and plan, etc., please refer to the transcribed history and physical.  HOSPITAL COURSE:  I performed a C5-6 and C6-7 extensive cervical diskectomy, interbody iliac crest, allograft arthrodesis, anterior cervical plating at C5, C7 (Codman anterior titanium plate and screws and ______ as well as a left carpal tunnel release).  The surgeries went well without complication. (for full details of this operation, please refer to transcribed operative note).  POSTOPERATIVE COURSE:  The patients postoperative course was remarkable for some chest pain.  She was initially evaluated by Dr. Glenna Fellows, who consulted cardiology and they did a cardiac work-up including a adenosine Cardiolite stress test on October 29, 2000, which was okay.  By postoperative day #2, October 30, 2000, the patient was afebrile, vital signs were stable.  She was eating well, ambulating well.  Her wound was healing well without signs of infection.  Her chest pain was gone and she was cleared by cardiology for discharge and she was discharged to home on October 30, 2000.  FINAL DIAGNOSES:  C5-6 and C6-7 herniated nucleus pulposus, spondylolysis, spinal stenosis, and left carpal tunnel syndrome, chest pain.  Approximately atrial tachycardia.  Hypotension.  Asthma.  Hypercholesterolemia.  Depression.  PROCEDURE PERFORMED:  C5-6 and C6-7 extensive anterior cervical diskectomy, interbody iliac crest allograft arthrodesis, anterior cervical plate C5 to C7 with carpal tunnel release and adenosine Cardiolite stress test. DD:  11/19/00 TD:  11/19/00 Job: [id number]

## 2011-01-24 NOTE — Group Therapy Note (Signed)
NAME:  Jordan [name], Jordan [name]                 ACCOUNT NO.:  [id number]   MEDICAL RECORD NO.:  [id number]          PATIENT TYPE:  INP   LOCATION:  A303                          FACILITY:  [facility]   PHYSICIAN:  Edward L. Luan Pulling, M.D.DATE OF BIRTH:  [birth year]   DATE OF PROCEDURE:  11/13/2005  DATE OF DISCHARGE:                                   PROGRESS NOTE   PROBLEM:  Clostridium difficile colitis.   SUBJECTIVE:  Ms. Driver says she is doing better.  She has no new complaints.  She still has some swelling of her legs.  Everything else is about the same  with no new complaints.   OBJECTIVE:  GENERAL:  She is awake, alert and looks much better.  He is  getting vancomycin enemas and it seems to have made a difference.  VITAL SIGNS:  Temperature 98.1, pulse 112, respirations 20, blood pressure  157/92, O2 saturations 95%.  CHEST:  Clear without wheezes.  HEART:  Regular.  ABDOMEN:  Soft.  EXTREMITIES:  Trace edema.   ASSESSMENT:  She is much improved.   PLAN:  She is going to get some Lasix today.  Will continue with everything  else and follow.      Edward L. Luan Pulling, M.D.  Electronically Signed     ELH/MEDQ  D:  11/13/2005  T:  11/13/2005  Job:  [id number]

## 2011-01-24 NOTE — Procedures (Signed)
NAME:  Jordan [name], Jordan [name]                 ACCOUNT NO.:  [id number]   MEDICAL RECORD NO.:  [id number]          PATIENT TYPE:  INP   LOCATION:  IC04                          FACILITY:  [facility]   PHYSICIAN:  Edward L. Luan Pulling, M.D.DATE OF BIRTH:  03-01-1946   DATE OF PROCEDURE:  DATE OF DISCHARGE:                                EKG INTERPRETATION   The rhythm is a supraventricular tachycardia with a rate in the130s. There  is right axis deviation and probable right bundle branch block. Q-waves are  seen inferiorly, I suspect related to the bundle branch block. Abnormal  electrocardiogram.      Jasper Loser. Luan Pulling, M.D.  Electronically Signed     ELH/MEDQ  D:  09/02/2005  T:  09/02/2005  Job:  [id number]

## 2011-01-24 NOTE — Discharge Summary (Signed)
NAME:  Jordan [name], Jordan [name]                 ACCOUNT NO.:  [id number]   MEDICAL RECORD NO.:  [id number]          PATIENT TYPE:  INP   LOCATION:  IC04                          FACILITY:  [facility]   PHYSICIAN:  Edward L. Luan Pulling, M.D.DATE OF BIRTH:  03-Jun-1946   DATE OF ADMISSION:  08/31/2005  DATE OF DISCHARGE:  12/29/2006LH                                 DISCHARGE SUMMARY   FINAL DISCHARGE DIAGNOSES:  1.  Respiratory failure related to asthma.  2.  Hypokalemia.  3.  Possible pneumonia.  4.  Supraventricular tachycardia.  5.  Hypertension.  6.  Osteoporosis.  7.  Depression.  8.  Gastroesophageal reflux disease with Nissen fundoplication x2.  9.  Supraventricular tachycardia.   HISTORY OF PRESENT ILLNESS:  This is a 65 year old who had a history of  asthma for many years.  She has been following with Dr. Cyndie Chime at [facility],  had actually been seen at [facility] earlier on the week of admission.  She then  developed severe problems with her asthma, came to the emergency room where  she was quite short of breath.  She was treated but not able to be released.  She was brought into the hospital.   PHYSICAL EXAMINATION:  Coughing up very thick, greenish sputum, and  clinically appeared to have pneumonia but this did not show on chest x-ray.  She had marked bilateral wheezes and was quite congested.   HOSPITAL COURSE:  She was admitted, started on intravenous steroids,  antibiotics and bronchodilators, but showed very poor response to all that.  She had tachycardia throughout her hospitalization.  She continued having  difficulty and was intubated on December 26, placed on mechanical  ventilation.  By December 29, she was able to be extubated but had a great  deal more trouble, almost immediately.  She was very anxious, and because of  her severe anxiety, I could not allow her to remain on CPAP which would have  been my initial preference as far as extubating her.  She was so anxious  that she was  pulling at the endotracheal tube.  She did meet weaning  criteria, so she was extubated initially.  However, she almost immediately  started having more problems.  She was then reintubated and transferred to  [facility] for further treatment.      Edward L. Luan Pulling, M.D.  Electronically Signed     ELH/MEDQ  D:  09/07/2005  T:  09/08/2005  Job:  [id number]

## 2011-01-24 NOTE — Group Therapy Note (Signed)
NAME:  Jordan [name], Jordan [name]                 ACCOUNT NO.:  [id number]   MEDICAL RECORD NO.:  [id number]          PATIENT TYPE:  INP   LOCATION:  IC04                          FACILITY:  [facility]   PHYSICIAN:  Edward L. Luan Pulling, M.D.DATE OF BIRTH:  [birth year]   DATE OF PROCEDURE:  09/03/2005  DATE OF DISCHARGE:                                   PROGRESS NOTE   SUBJECTIVE:  Jordan [name] is overall about the same. She, of course, was  intubated yesterday.   OBJECTIVE:  She continues to have elevated heart rate, but this morning her  chest is much clearer. Blood pressure 175/73. Her heart rate is 121. O2 sats  100%. Respirations are about 15. Her lab work shows potassium is 3, BUN 19,  creatinine 1.3, glucose is 190, sodium 132. CBC shows white count 12,800,  hemoglobin 10, platelets 193. Blood gas shows on 35% 650 tidal volume, rate  of 12, pH 7.45, pO2 is 33, pCO2 is 33.9, pO2 of 136. Her chest is much  clearer today, and she is of course on Diprivan.   ASSESSMENT/PLAN:  My plan then is weaning per protocol today. She will need  replacement of her potassium. Chest x-ray today no significant change. Plan  then is to replace her potassium. give her weaning per protocol. She will  need be reduced on the Diprivan. Continue everything else. She continues to  get albuterol and continues to be tachycardia, but it is better. I am going  to go ahead and give her some IV Cardizem for her heart rate and blood  pressure and follow from there.      Edward L. Luan Pulling, M.D.  Electronically Signed     ELH/MEDQ  D:  09/03/2005  T:  09/03/2005  Job:  [id number]

## 2011-01-24 NOTE — Op Note (Signed)
NAME:  Jordan [name], Jordan [name]                           ACCOUNT NO.:  [id number]   MEDICAL RECORD NO.:  [id number]                   PATIENT TYPE:  INP   LOCATION:  3038                                 FACILITY:  [facility]   PHYSICIAN:  Ophelia Charter, M.D.            DATE OF BIRTH:  1946-05-18   DATE OF PROCEDURE:  10/19/2002  DATE OF DISCHARGE:                                 OPERATIVE REPORT   BRIEF HISTORY:  The patient is a 65 year old white female who I performed a  C5-6 and C6-7 anterior cervical diskectomy fusion and plating on  approximately 2 years ago. She did well for about a year and a half but then  developed left shoulder pain. She failed medical management and was worked  with a cervical MRI which demonstrated spondylosis and herniated nucleus  pulposus at C4-5 on the left. I discussed the various treatment options with  her including surgery. The patient weighed the risks, benefits and  alternatives to surgery and decided to proceed with a C4-5 anterior cervical  diskectomy with fusion plating and removal of her old plate.   PREOPERATIVE DIAGNOSIS:  C4-5 herniated nucleus pulposus, spondylosis,  stenosis, cervicalgia, cervical radiculopathy.   POSTOPERATIVE DIAGNOSIS:  C4-5 herniated nucleus pulposus, spondylosis,  stenosis, cervicalgia, cervical radiculopathy.   PROCEDURE:  C4-5 extensive anterior cervical diskectomy; interbody iliac  crest allograft arthrodesis, anterior cervical plating (titanium plate and  screws), as well as removal of the old anterior cervical plate from C5 to  C7.   SURGEON:  Ophelia Charter, M.D.   ASSISTANT:  Marchia Meiers. Vertell Limber, M.D.   ANESTHESIA:  General endotracheal anesthesia.   ESTIMATED BLOOD LOSS:  Minimal.   SPECIMENS:  None.   DRAINS:  None.   DESCRIPTION OF PROCEDURE:  The patient was brought to the operating room by  the anesthesia team. General endotracheal anesthesia was induced. The  patient remained in the supine  position. A roll was placed under her  shoulders to place the neck in slight extension. The anterior cervical  region was then prepared with Betadine scrub and Betadine solution. Sterile  drapes were applied. I then injected the area to be incised with Marcaine  with epinephrine solution.   Using a scalpel I made a transverse incision in the patient's left anterior  neck, i.e. through  her old incision. I used the Metzenbaum scissors to  divide the platysma muscle and then to dissect medial to the  sternocleidomastoid muscle, jugular vein and carotid artery. There was some  expected amount of scar tissue from her prior surgery. I carefully  identified the esophagus and retracted it medially and then cleared the soft  tissue from the anterior cervical spine using Kidner swabs. There was a lot  of scar tissue over the plate. I incised it with the #15 blade scalpel, and  then unlocked the cams and then removed the screws and then removed the  plates from the C5 and C7.   I then used a __________  to detach the medial border off the longus coli  muscle from the C4-5 intervertebral disk space and inserted the Caspar self-  retaining retractor for exposure. I then incised the C4-5 intervertebral  disk space with a #15 blade scalpel. I performed a partial diskectomy using  the pituitary forceps and the Carlens curets. I inserted distraction screws  at C4 and C5 to distract the interspace and then used the high-speed drill  to decorticate the vertebral endplate at D34-534 and drill away the remainder  of the intravertebral disk. Also I drilled away some posterior spondylosis  and thinned out the posterior and longitudinally ligament with the high-  speed drill.   I then excised the ligament with the retro knife. The posterior longitudinal  ligament was somewhat scarred down to the dura at this level, presumably as  a result of a prior surgery, although it was at a different level. In any  event I  removed the posterior longitudinal ligament with the Kerrison punch,  undercutting the vertebral endplates at D34-534, decompressing the  thecal sac  and then performed a foraminotomy at the bilateral C5 nerve root,  decompressing the bilateral C5 nerve roots.   We now turned our attention to the anterior interbody arthrodesis. We  obtained the iliac crest tricortical allograft bone graft and fashioned it  to these approximate dimensions, 7 mm in height, 1 cm in depth. We inserted  the bone graft into the distracted interspace and then removed the  distracting screw. __________  of the bone graft.   We then turned our attention to the anterior spinous rotation and the  appropriate length Codman anterior cervical plate,  along the anterior  aspect of the C4 and C5. We drilled 2 holes in the C4, 2 in C5, capped the  holes and then secured the plate to the vertebral body by placing two 12-mm  screws at C4 and 2 at C5.  We then obtained an intraoperative radiograph  that demonstrated a good position of the plate screws and interbody graft.  We then secured these screws to the plate by locking each cam.   We then copiously irrigated the wound out with Kanamycin solution, removed  the solution and then achieved stringent hemostasis using bipolar  electrocautery and Gelfoam. We then removed the Caspar self-retaining  retractor and then inspected the esophagus for any damage; there was none  apparent. We then reapproximated the patient's platysma muscle with  interrupted 3-0 Vicryl suture and subcutaneous tissue with interrupted 3-0  Vicryl suture and the skin with Steri-Strips and Benzoin.  The wound was  then coated with Bacitracin ointment. A sterile dressing was applied.   The drapes were removed. The patient was subsequently extubated by the  anesthesia team and transported to the post anesthesia care unit in stable condition. All sponge, instrument and needle counts were correct at the end   of the case.                                                  Ophelia Charter, M.D.    JDJ/MEDQ  D:  10/19/2002  T:  10/19/2002  Job:  [id number]

## 2011-01-24 NOTE — Group Therapy Note (Signed)
NAME:  Jordan [name], Jordan [name]                 ACCOUNT NO.:  [id number]   MEDICAL RECORD NO.:  [id number]          PATIENT TYPE:  INP   LOCATION:  IC04                          FACILITY:  [facility]   PHYSICIAN:  Edward L. Luan Pulling, M.D.DATE OF BIRTH:  07-19-1946   DATE OF PROCEDURE:  09/02/2005  DATE OF DISCHARGE:                                   PROGRESS NOTE   TIME:  About 900.   PROBLEM:  Asthma.   SUBJECTIVE:  Jordan [name] has had increasing problems with her asthma through  the last 24 hours, and she says she is tired. Her heart rates have been in  the 150s. She does look increasingly fatigued, and after some discussion  this morning with explanation of exactly what we would be talking about, she  agrees that she would prefer to go ahead and be intubated fairly electively  for mechanical ventilation to try to help her with her overall situation.   OBJECTIVE:  VITAL SIGNS:  Her exam shows temperature is 98.3, pulse 86,  respirations 28, blood pressure 139/102. The heart rate of 86 was early; by  the time I came in, it was about 150, regular, but quite rapid.  GENERAL:  She looks very fatigued,  CHEST:  Her chest shows decreased breath sounds and end-expiratory wheezes.  HEART:  Her heart is regular but with a marked tachycardia.   ASSESSMENT:  Although she is maintaining her pH and maintaining pCO2, she  appears to be wearing out, and I think that the best thing to do is to go  ahead and intubate her at least semi-electively now try to give her some  rest. We can go and suction sputum, etc., and hopefully take some of the  strain off of her heart with this very high heart rate. She understands  and agrees.      Edward L. Luan Pulling, M.D.  Electronically Signed     ELH/MEDQ  D:  09/02/2005  T:  09/02/2005  Job:  [id number]

## 2011-01-24 NOTE — Group Therapy Note (Signed)
NAME:  RAMA, [name]                 ACCOUNT NO.:  [id number]   MEDICAL RECORD NO.:  [id number]          PATIENT TYPE:  INP   LOCATION:  IC04                          FACILITY:  [facility]   PHYSICIAN:  Edward L. Luan Pulling, M.D.DATE OF BIRTH:  [birth year]   DATE OF PROCEDURE:  09/02/2005  DATE OF DISCHARGE:                                   PROGRESS NOTE   At 60, Ms. Brando was taken to the intensive care unit, and after receiving  4 mg of Versed intravenously and approximately 30 mg of Diprivan  intravenously, she was intubated with a #8 endotracheal tube on the first  attempt. She had bilateral breath sounds, had positive color change on the  CO2 monitor. Chest x-ray is pending. Her heart rate is now down in the 120  to 130 range, O2 saturation is 100%, blood pressure about 140/70. Her chest  is a little clearer. We are going to put on a fairly long expiratory phase  since her major problem is asthma in an attempt to see if we can allow her  to a actually empty her lungs.      Edward L. Luan Pulling, M.D.  Electronically Signed     ELH/MEDQ  D:  09/02/2005  T:  09/02/2005  Job:  [id number]

## 2011-01-24 NOTE — Consult Note (Signed)
[facility]  Patient:    Jordan [name], Jordan [name]                        MRN: [id number] Proc. Date: 10/28/00 Adm. Date:  [id number] Attending:  Newman Pies D CC:         Ophelia Charter, M.D.   Consultation Report  ADDENDUM:  The patient will be started on prevacid 30 mg p.o. b.i.d. for possible gastritis.  She has also been instructed to take a baby aspirin daily.  She will be given 1 aspirin today for possible ischemia.  In view of her recent surgery will not initiate anticoagulation. DD:  10/28/00 TD:  10/29/00 Job: [id number]

## 2011-01-24 NOTE — H&P (Signed)
NAME:  Jordan [name], Jordan [name]                 ACCOUNT NO.:  [id number]   MEDICAL RECORD NO.:  [id number]          PATIENT TYPE:  AMB   LOCATION:                                FACILITY:  [facility]   PHYSICIAN:  R. Garfield Cornea, M.D. DATE OF BIRTH:  [birth year]   DATE OF ADMISSION:  DATE OF DISCHARGE:  LH                              HISTORY & PHYSICAL   CHIEF COMPLAINT:  Need for colorectal cancer screen.   Miss Jordan [name] is a pleasant 65 year old Caucasian female with a  history of  GERD, status post laparoscopic Nissen fundoplication with redo down at  [facility] who is now doing very well on Aciphex 20 mg orally daily.  She  presents for colorectal cancer screening.  Her last colonoscopy was in  1999 at which time she was found to have probable anal canal  hemorrhoids, otherwise normal rectum and colon.  That was done for  Hemoccult positive stool.  There is no family history of colorectal  metaplasia.  She has no lower tract symptoms at this time.  She was  treated with C. diff associated colitis, in fact was hospitalized for  this problem one year ago.  EGD and Bravo pH probe study August of last  year demonstrated stigmata of prior antireflux surgery; however, 48 hour  Bravo revealed reflux during the study off acid suppression therapy.   She has a history of asthma with respiratory failure requiring  intubation previously.  Status post MI in the past.  History of acute  renal failure related to Clostridium difficile colitis.  History of  hypertension.  Osteoporosis, depression.   PAST SURGICAL HISTORY:  Oophorectomy, C-section, Nissen fundoplication  times 2.  History of concussion one year ago.  History of  pseudomeningocele treated surgically by Dr. Newman Pies previously.  Arthroscopic knee surgery, treated for MRSA infection down at [facility].   CURRENT MEDICATIONS:  1. Prozac 40 mg daily.  2. Prednisone 5 mg daily.  3. Ventolin p.r.n.  4. Aciphex 20 mg orally daily.  5. Lipitor 40 mg  daily.  6. __________  5 mg daily.  7. Verapamil 240 mg daily.  8. Advair b.i.d.  9. Spiriva daily.  10.ASA 81 mg daily.  11.Acidophilus daily.   ALLERGIES:  SULFA, IODINE, BARBITURATES.   FAMILY HISTORY:  No history of chronic GI or liver illness.   SOCIAL HISTORY:  The patient is divorced.  She is retired from the  Audiological scientist.  Rarely consumes alcohol or tobacco.   REVIEW OF SYSTEMS:  No recent new chest pain or dyspnea on exertion.  No  fever or chills.  No weight loss, no melena or hematochezia.   PHYSICAL EXAMINATION:  GENERAL:  Physical examination reveals a pleasant  65 year old lady resting comfortably.  Weight 154, height 5'3.  VITAL SIGNS:  Temperature 97.9, blood pressure 136/70, pulse 84.  SKIN:  Warm and dry.  There is no jaundice.  No cutaneous stigmata of  chronic liver disease.  HEENT:  No scleral icterus.  Conjunctivae pink.  Oral cavity no lesions.  CHEST:  Lungs are clear to auscultation.  CARDIAC:  Regular rate and rhythm without murmur, gallop, or rub.  ABDOMEN:  Nondistended.  Positive bowel sounds.  Abdomen is soft,  nontender without appreciable mass or organomegaly.  Laparoscopic site  is well healed.  EXTREMITIES:  No edema.   IMPRESSION:  Miss Jordan [name] is a pleasant 65 year old lady who is  essentially 10 years out from her last screening colonoscopy.  She is  doing very well from a lower gastrointestinal tract standpoint.  She has  asked for a screening colonoscopy, this is not unreasonable.  I have  discussed this __________ with Adam Phenix; potential risks, benefits,  and alternatives have been reviewed.  Her questions were answered and  she is agreeable.  I will plan her for colonoscopy in the very near  future.   As a separate issue I am very pleased to see that her typical reflux  symptoms and her LPR symptoms are now well controlled on Aciphex 20 mg  orally daily.  I have encouraged her to continue on that regimen.      Bridgette Habermann, M.D.  Electronically Signed     RMR/MEDQ  D:  09/14/2006  T:  09/14/2006  Job:  [id number]   cc:   Jonnie Kind, M.D.  Fax: [phone]

## 2011-01-24 NOTE — Group Therapy Note (Signed)
NAME:  Jordan [name], Jordan [name]                 ACCOUNT NO.:  [id number]   MEDICAL RECORD NO.:  [id number]          PATIENT TYPE:  INP   LOCATION:  A223                          FACILITY:  [facility]   PHYSICIAN:  Edward L. Luan Pulling, M.D.DATE OF BIRTH:  June 23, 1946   DATE OF PROCEDURE:  08/02/2006  DATE OF DISCHARGE:                                 PROGRESS NOTE   Ms. Devaul is overall much improved, although she is still coughing  considerably this morning.  She says everything is going okay.  She has  no new complaints.  She is not any more short of breath than she has  been.   Her exam shows temperature is 97.4, pulse 81, respirations 20, blood  sugar 206, blood pressure 117/60.  CHEST:  Her chest is clear.  HEART:  Her heart is regular.  ABDOMEN:  Soft.  LUNGS:  She still has some rhonchi bilaterally but does not have the  tight wheezes that she had earlier.   ASSESSMENT:  She is better but still with problems.   PLAN:  Try to get her up some tomorrow . Continue treatments.      Edward L. Luan Pulling, M.D.  Electronically Signed     ELH/MEDQ  D:  08/02/2006  T:  08/02/2006  Job:  [id number]

## 2011-01-24 NOTE — Group Therapy Note (Signed)
NAME:  Jordan [name], Jordan [name]                 ACCOUNT NO.:  [id number]   MEDICAL RECORD NO.:  [id number]          PATIENT TYPE:  INP   LOCATION:  IC09                          FACILITY:  [facility]   PHYSICIAN:  Edward L. Luan Pulling, M.D.DATE OF BIRTH:  10-Jan-1946   DATE OF PROCEDURE:  DATE OF DISCHARGE:                                   PROGRESS NOTE   Ms. Loven is overall about the same.  She is still having significant  coughing episodes every morning.  She is not really bringing anything up.  She is still short of breath.  She has not had any further episodes of  severe hypertension.  Sputum culture is pending.  Blood culture, thus far,  is negative.   Her exam shows that she is wheezing bilaterally.  Her pulse had been in the  80s, now about 120 after a coughing episode.  Blood pressure 153/65.  Her  chest with marked bilateral wheezes.  Her heart is regular with a  tachycardia.  Her abdomen is soft.  She does not have any edema.  CNS  examination grossly intact.   Her basic metabolic profile shows her glucose is 175, BUN 20, creatinine  1.2; other electrolytes are normal.  CBC:  White count is 14,100, hemoglobin  is 10.9, hematocrit 31.8, and platelets 208.   ASSESSMENT:  She has severe asthma.  She had developed glucose intolerance  with her multiple illnesses and I am certain that the steroids are not  helping.  She has a severe cough.  We are going to try some Tussionex.  We  are going to try some Humibid to see if we break up her sputum.  She did  have slightly elevated cardiac enzymes, which I think are not indicative of  an acute MI, but apparently she did have acute MI when she was evaluated at  [facility] earlier this year, although she did have a cardiac catheterization and  does not have any obstructive coronary disease.   PLAN:  Ask for cardiology consultation, add the Humibid, add the Tussionex,  continue with Cardizem, hold on lisinopril in case that has anything to do  with her cough,  restart Prozac, echocardiogram today, cardiology  consultation today, and go ahead and check glucoses.      Edward L. Luan Pulling, M.D.  Electronically Signed     ELH/MEDQ  D:  07/31/2006  T:  07/31/2006  Job:  [id number]

## 2011-01-24 NOTE — Group Therapy Note (Signed)
NAME:  Jordan [name], Jordan [name]                 ACCOUNT NO.:  [id number]   MEDICAL RECORD NO.:  [id number]          PATIENT TYPE:  INP   LOCATION:  A303                          FACILITY:  [facility]   PHYSICIAN:  Edward L. Luan Pulling, M.D.DATE OF BIRTH:  1946-06-25   DATE OF PROCEDURE:  DATE OF DISCHARGE:                                   PROGRESS NOTE   SUBJECTIVE:  Jordan [name] is admitted with C. difficile colitis. She is overall  doing a little bit better. She says that she has had a little bit of a bowel  movement this morning.   OBJECTIVE:  Her physical examination this morning shows that her chest is  clear, her heart is regular, her abdomen still protuberant, she does have  more bowel sounds than the last time that I heard her. Her temperature is  97.9, pulse 109, respirations 20, blood pressure 111/73, O2 saturation is  97% on room air. Her abdomen is not particularly tender.   Lab work: White count 20,900, hemoglobin 9.7, platelets 289. Electrolytes  show her chloride is 113, CO2 is 18 (better), glucose 141, BUN is 30 which  is better, creatinine is 1.9 which is better.   ASSESSMENT:  Overall I think she is improving   PLAN:  Plan is to have her continue with her medications, we will go ahead  and get labs again tomorrow and continue her other treatments.      Edward L. Luan Pulling, M.D.  Electronically Signed     ELH/MEDQ  D:  11/09/2005  T:  11/09/2005  Job:  [id number]

## 2011-01-24 NOTE — H&P (Signed)
NAME:  Jordan [name], Jordan [name]                 ACCOUNT NO.:  [id number]   MEDICAL RECORD NO.:  [id number]          PATIENT TYPE:  INP   LOCATION:  A225                          FACILITY:  [facility]   PHYSICIAN:  Edward L. Luan Pulling, M.D.DATE OF BIRTH:  [birth year]   DATE OF ADMISSION:  08/31/2005  DATE OF DISCHARGE:  LH                                HISTORY & PHYSICAL   REASON FOR ADMISSION:  Asthma.   HISTORY:  Jordan [name] is a 65 year old who has a long known history of severe  asthma.  I have been following her and she has also been following at [facility]  and actually was seen at [facility] earlier this week.  Apparently then she seemed  to be doing fairly well but after she came home she had increasing shortness  of breath, cough productive of a greenish/yellowish sputum and she has  continued having problems.  She has had multiple exacerbations of her COPD  but has not had to be admitted all that frequently.  She says she has had  perhaps some fever.  This is the worst exacerbation she has had in some  time.  She has some discomfort in the left side of her chest on the back.   PAST MEDICAL HISTORY:  Positive for the asthma.  She had supraventricular  tachycardia, hypertension, osteoporosis probably related to steroid use,  depression.   PAST SURGICAL HISTORY:  She has had an oophorectomy, she has had C-spine  surgery, a Nissen fundoplication x2 and she had a concussion about a year  ago that required her to be admitted to [facility].  She has had a spinal fluid leak following one of her surgeries.  She has  also had arthroscopic knee surgery and open knee surgery.   MEDICATIONS:  Her medications currently are Spiriva one inhalation daily,  Advair 500/50 one puff twice daily, Singulair 10 mg daily, prednisone 40 mg  daily, Lanoxin 0.25 mg daily, Micardis HCT 80/12.5 daily, Prozac 40 mg  daily, and she also says that she has fairly recently been started on  Actonel 35 mg weekly and  seems to have done worse since she started that.   ALLERGIES:  She is allergic to SULFA and apparently to BARBITURATES.   FAMILY HISTORY:  Positive for osteoporosis and asthma, her mother died at  63; father in his 32s with asthma and congestive heart failure; there was  also a history of diabetes in the family.   SOCIAL HISTORY:  She is divorced.  She has worked in Audiological scientist.  She  uses no tobacco.  She drinks alcohol socially or has in the past.   REVIEW OF SYSTEMS:  Review of systems except as mentioned is essentially  negative.  She feels like her right foot may be slightly swollen.   PHYSICAL EXAMINATION:  Blood pressure initially 225/149 then 156/68, pulse  in the 120s, respirations about 30 mildly labored, O2 saturation initially  88%, now 93%.  Her mucous membranes are dry.  She appears to be in moderate  distress.  She has wheezing that  is audible without the use of a  stethoscope.  Her pupils are reactive.  Mucous membranes as mentioned are  dry.  Her neck shows a surgical scar, no jugular venous distension, no  bruits.  Tympanic membranes are intact.  Her chest shows wheezes  bilaterally, some rhonchi, some rales.  Her heart is regular without gallop.  Her abdomen is soft without masses.  Extremities showed perhaps trace at  most edema of the right foot.  CNS is grossly intact.   LABORATORY WORK:  Electrolytes are normal; her glucose 139.  CBC shows white  count 11,300, hemoglobin is 13.2, platelets 220, 93% neutrophils, no  eosinophils.  Blood gas is pending.   Chest x-ray shows no acute change; hyperaeration consistent with asthma.   ASSESSMENT AND PLAN:  Plan then is to admit her, put her on high dose  steroids, inhaled bronchodilators, I am going to continue with her Advair,  give her IV fluids, antibiotics.  I think we should hold the Actonel and  this may have something to do with her problem so I think we are going to  need to stop that at this point.  I do  not plan on any other treatments  until we see how she does with this.      Edward L. Luan Pulling, M.D.  Electronically Signed     ELH/MEDQ  D:  08/31/2005  T:  08/31/2005  Job:  [id number]

## 2011-01-24 NOTE — Discharge Summary (Signed)
   NAME:  Jordan [name], Jordan [name]                           ACCOUNT NO.:  [id number]   MEDICAL RECORD NO.:  [id number]                   PATIENT TYPE:  INP   LOCATION:  3004                                 FACILITY:  [place]   PHYSICIAN:  Ophelia Charter, M.D.            DATE OF BIRTH:  02/27/1946   DATE OF ADMISSION:  10/25/2002  DATE OF DISCHARGE:  10/28/2002                                 DISCHARGE SUMMARY   For full details of this admission, please refer to history and physical.   HOSPITAL COURSE:  I admitted the patient to [facility]  on October 25, 2002, with diagnosis of a pseudomeningocele.  On October 26, 2002, I performed an exploration of her wound and repair of  pseudomeningocele using Tisseal glue.  The surgery went well without  complications (for full details of this operation, please refer to typed  operative note.   POSTOPERATIVE COURSE:  The remainder of the patient's hospital course was  unremarkable.  She did develop a low grade fever on postoperative day #1;  i.e., October 27, 2002, with a temperature of 101.6.  This resolved by  October 28, 2002.  The patient was afebrile, vital signs stable.  She was  eating well and ambulating well.  Her incision was flat.  There was no  discharge and she was requesting discharge home.  She was therefore  discharged home on October 28, 2002.   DISCHARGE INSTRUCTIONS:  The patient was given written discharge  instructions and instructed to follow up with me in one week.   FINAL DIAGNOSIS:  Pseudomeningocele.   PROCEDURE PERFORMED:  Repair of pseudomeningocele.                                               Ophelia Charter, M.D.    JDJ/MEDQ  D:  12/01/2002  T:  12/02/2002  Job:  [id number]

## 2011-01-24 NOTE — Group Therapy Note (Signed)
NAME:  Jordan [name], Jordan [name]                 ACCOUNT NO.:  [id number]   MEDICAL RECORD NO.:  [id number]          PATIENT TYPE:  INP   LOCATION:  A303                          FACILITY:  [facility]   PHYSICIAN:  Edward L. Luan Pulling, M.D.DATE OF BIRTH:  04/01/1946   DATE OF PROCEDURE:  11/07/2005  DATE OF DISCHARGE:                                   PROGRESS NOTE   SUBJECTIVE:  Jordan [name] continues to have significant abdominal pain. She is  her bowels are not moving much. She continues to having a good bit of pain.   OBJECTIVE:  VITAL SIGNS:  Her physical examination now shows her temperature  is 97.2, pulse is 134, respirations 23, blood pressure 71/55. Her O2  saturations is 97%.  CHEST:  Her chest is actually relatively clear.  ABDOMEN:  Her abdomen is protuberant but not hard.   Her white blood count is 34,200, hemoglobin is 10.1, platelets 251.   ASSESSMENT:  She has C. Difficile colitis with an ileus. She has marked  elevation of her white blood cell count. I have discussed her situation with  Dr. Laural Golden who is seeing her in consultation. He thinks we should go ahead  with an NG tube and then have her get a flat and upright abdominal film to  see how big her colon is. Continue all of the other treatments and follow.      Edward L. Luan Pulling, M.D.  Electronically Signed     ELH/MEDQ  D:  11/07/2005  T:  11/07/2005  Job:  [id number]

## 2011-01-24 NOTE — Consult Note (Signed)
NAME:  Jordan [name]                 ACCOUNT NO.:  [id number]   MEDICAL RECORD NO.:  [id number]          PATIENT TYPE:  INP   LOCATION:  A303                          FACILITY:  [facility]   PHYSICIAN:  R. Garfield Cornea, M.D. DATE OF BIRTH:  06/21/1946   DATE OF CONSULTATION:  11/05/2005  DATE OF DISCHARGE:                                   CONSULTATION   REASON FOR CONSULTATION:  Abdominal pain, diffuse colitis on CT, diarrhea.   HISTORY OF PRESENT ILLNESS:  [name] is a pleasant 65 year old  lady with COPD, was recently hospitalized for period of time here and  subsequently at [facility] with pneumonia and respiratory failure.  She was  discharged around January 17.  Since that time she has had some abdominal  griping or diffuse crampy abdominal pain.  Approximately five days ago she  started having tenesmus and watery, nonbloody diarrhea.  She was seen by  Claris Gower, FNP, and associates at [facility], where she was  evaluated.  She was hemoccult negative on rectal exam.  She reportedly was  sent over to see Dr. Luan Pulling, who subsequently admitted her to the hospital.  __________findings on CT was a diffuse pancolitis and some free fluid  suggestive of a pancolitis such as C. difficile.  White count was elevated  at 18,800, H&H 10.2 and 30.2, MCV 84.7, platelet count 264,000.   She was slated to have a screening colonoscopy by me in January of this  year, but because she was ill this was not done.  She has not had any  nausea, vomiting, or melena, or hematochezia.  She has been started on IV  Cipro and Flagyl empirically and she is on IV morphine for abdominal pain  q.2 h.   PAST MEDICAL HISTORY:  1.  Significant for history of laparoscopic Nissen fundoplication by Dr.      Arnoldo Morale with redo by Dr. __________down at [facility] a couple of years ago.      She has not had any gastroesophageal reflux disease symptoms.  She is      not on any PPI therapy.  She is not having any  dysphagia.  She had been      on Actonel back in December because of chest pain.  It was stopped.  2.  Her other medical problems include hypertension.  3.  Osteoporosis, probably secondary to steroids.  4.  Depression.   PAST SURGICAL HISTORY:  1.  Oophorectomy.  2.  C-section.  3.  Nissen fundoplication x2.  4.  Concussion one year ago required admission to [facility].  She also had a pseudomeningocele treated surgically by Dr.      Newman Pies.  5.  Arthroscopic knee surgery and open knee surgery previously.  6.  History of protracted hospitalization related to pneumonia and      respiratory failure.  7.  Reported she has MRSA the first of this year at [facility].   CURRENT MEDICATIONS  1.  Prozac 40 mg daily.  2.  Singulair 10 mg daily.  3.  Prednisone 10 mg daily.  4.  Lipitor 20 mg daily.  5.  Lisinopril 20 mg daily.  6.  Spiriva daily.  7.  Advair daily.   ALLERGIES   1.  SULFA.  2.  ASPIRIN.  3.  BARBITURATES.   FAMILY HISTORY:  No history of colon cancer or IBD.   SOCIAL HISTORY:  The patient is divorced.  She is retired from the banking  injury.  Rarely consumes alcohol.  No tobacco.   REVIEW OF SYSTEMS:  As in history of present illness.   PHYSICAL EXAMINATION:  GENERAL: A pleasant 65 year old lady resting  comfortably in the hospital bed.  VITAL SIGNS:  Temperature 98, pulse 106, respiratory rate 20, blood pressure  104/52  SKIN:  Warm and dry.  There is no jaundice or indication of chronic liver  disease.  Conjunctives are pink.  CHEST:  Lungs are clear with distant breath sounds.  CARDIAC:  Regular rate and rhythm without murmur, gallop, or rub.  ABDOMEN:  Full.  She has very good bowel sounds.  Abdomen is diffusely  tender.  She guards voluntarily.  There is no rebound tenderness.  No  obvious mass or organomegaly.  EXTREMITIES:  No edema.   LABORATORY:  White count 18,800, hemoglobin and hematocrit 10.2 and 30.2,  MCV 84.7.   She has 89% polys.  Sodium 134, potassium 3.5, chloride 106, CO2  of 25, glucose 140, BUN 18, creatinine 1.1, total bilirubin 9.5. Alkaline  phosphatase is 73. AST of 16, ALT 13, albumin 2.5. Calcium 8.3.   IMPRESSION:  [name] is a 65 year old lady with multiple medical  problems recently hospitalized for a prolonged period of time here and  subsequently at [facility] with respiratory failure.  It appears that she has  recovered; however, she has had abdominal pain over the past one month and  now non-bloody diarrhea and tenesmus for the better part of the last week.  CT reveals a pancolitis, highly suggestive of pseudomembranous colitis.   She has leukocytosis.  She looks as though she is ill, but not extremely  toxic.   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   RECOMMENDATIONS:   1.  Will stop her IV Flagyl and begin p.o. Flagyl 250 mg orally q.i.d.  Will      go ahead and add some Questran 4 gm orally to her  regiment daily, not      to be given within 2 hours of other medications.  Add ________ one      capsule daily. Will start her on some Protonix 40 mg orally daily      empirically.   1.  We need to be cautious with narcotics, as narcotic therapy would      increase her risk of megacolon.  I have increased her interval on the      morphine from 2mg  IV q2h. to q.4h.   1.  Stool studies have already been submitted.  I am okay with allowing her      to stay on Cipro for the time being, but we may need to adjust her      antibiotics further in the very near future.  As a separate issue, would      get back on track for her to have an elective colonoscopy at some point      once she is over her acute illness.  I see  no need to perform lower      endoscopy at the current time.  I would like to thank Dr. Velvet Bathe for letting me see this nice lady today  in consultation.      Bridgette Habermann, M.D.   Electronically Signed     RMR/MEDQ  D:  11/05/2005  T:  11/05/2005  Job:  [id number]

## 2011-01-24 NOTE — H&P (Signed)
[facility]  Patient:    Jordan [name], Jordan [name]                        MRN: [id number] Adm. Date:  [id number] Attending:  Newman Pies D                         History and Physical  PAST MEDICAL HISTORY:  Hypertension, asthma, hypercholesterolemia, proximal atrial tachycardia, depression, gastroesophageal reflux disease, benign ovarian cyst.  PAST SURGICAL HISTORY:  Status post appendectomy in 1965.  Arthroscopic knee surgery in 1985, 1987, and 1989.  Open knee surgery in 1992.  Laparoscopid Nissen procedure in 1998 as well as oophorectomy in January of 2001.  MEDICATIONS: 1. Prozac 40 mg p.o. q.d. 2. Lanoxin 0.25 mg p.o. q.d. 3. Evista 60 mg p.o. q.d. 4. Singulair 10 mg p.o. q.d. 5. Accupril 20 mg p.o. q.d. 6. Lipitor 20 mg p.o. q.d. 7. Advair one puff b.i.d.  ALLERGIES:  SULFA, BARBITURATES.  FAMILY HISTORY:  Osteoporosis and asthma in the patients mother who died at age 35.  The patients father died at age 107 and suffered from asthma and congestive heart failure.  The patient had a grandfather who suffered from diabetes and a grandmother who had "heart problems."  SOCIAL HISTORY:  The patient is divorced, she has no children, she lives in [place].  She is employed at [employer] as a Engineer, water.  She denies tobacco and drug use.  She drinks alcohol socially.  REVIEW OF SYSTEMS:  Negative except as above.  PHYSICAL EXAMINATION:  GENERAL:  A pleasant well-developed, well-nourished 65 year old white female complaining of left arm pain.  VITAL SIGNS:  Height 5 foot 5 inches, weight 148 pounds.  HEENT:  Normal.  NECK:  Supple.  There are no masses, deformities, tracheal deviation, jugular venous distension, or carotid bruits.  She has limited cervical range of motion.  Spurlings test is positive on the left and negative on the right. Lhermittes sign was not present.  CHEST:  Symmetric.  Lungs are clear to  auscultation.  HEART:  Regular rate and rhythm.  ABDOMEN:  Soft and nontender.  EXTREMITIES:  No obvious deformities.  Tinels test is equivocal bilaterally. No tenderness to range of motion.  BACK:  No point tenderness, deformities, straight leg raise testing, and faberes testing is negative bilaterally.  NEUROLOGICAL:  The patient is alert and oriented x 3.  Cranial nerves II-XII intact bilaterally.  Vision and hearing are grossly normal bilaterally.  Motor strength is 5/5 in her bilateral deltoid, bicep, handgrip, wrist extensor, interosseous psoas, quadriceps, gastrocnemius, extensor hallucis longus, and right tricep.  Her left tricep strength is diminished at 4/5.  Cerebellar examination is intact to rapid alternating movements of the upper extremities bilaterally.  Sensory examination demonstrates some decreased light touch sensation in her entire hand more so in her thumb, index, and middle finger on the left.  Deep tendon reflexes are 2+/4 in her bilateral biceps, brachial radialis, quadriceps, gastrocnemius, right tricep, 2/4 in her left tricep.  IMAGING STUDIES:  The patient had cervical MRI performed without contrast at [facility] on September 26, 2000.  This demonstrated a mild degenerative spondylolisthesis at C3-4 without significant neural compression.  She has degenerative disk disease and herniated nucleus pulposus at C5-6 and C6-7, worse at C6-7.  C6-7 has a left sided herniated nucleus pulposus compressing the left C7 nerve root.  ASSESSMENT:  C5-6 and C6-7  herniated nucleus pulposus, degenerative disk disease, spondylosis, spinal stenosis, cervicalgia, cervical radiculopathy.  I discussed the situation with the patient, reviewed the MRI scan with her, pointed out the abnormalities.  Her symptoms are most consistent with a left C7 radiculopathy, but she does have significant spondylosis at C5-6.  She has thus far failed medical management and I have discussed  the various treatment options with her including doing nothing, continuing medical management, and surgery.  I have discussed both anterior and posterior surgery options.  I think she would benefit from C5-6 and C6-7 anterior cervical diskectomy, interbody iliac crest allograft arthrodesis, and anterior cervical plating.  I have described this procedure to her and shown her surgical models, and discussed the risks of surgery extensively.  She wants to proceed with surgery on October 28, 2000.  Left carpal tunnel syndrome.  She asked me if I could do that surgery in the same setting.  That is fine.  I described the surgery and its risks including the risks of infection, hemorrhage, wound healing problems, reflex sympathetic dystrophy, hand weakness, failure to relieve her numbness, worsening of the numbness, etc.  She has decided to proceed with carpal tunnel release also on October 28, 2000. DD:  10/28/00 TD:  10/28/00 Job: [id number]

## 2011-01-24 NOTE — Group Therapy Note (Signed)
NAME:  Jordan [name], Jordan [name]                 ACCOUNT NO.:  [id number]   MEDICAL RECORD NO.:  [id number]          PATIENT TYPE:  INP   LOCATION:  A303                          FACILITY:  [facility]   PHYSICIAN:  Edward L. Luan Pulling, M.D.DATE OF BIRTH:  November 22, 1945   DATE OF PROCEDURE:  DATE OF DISCHARGE:  11/18/2005                                   PROGRESS NOTE   PROBLEMS:  Clostridium difficile colitis, severe asthma/COPD, history of a  recent myocardial infarction.   SUBJECTIVE:  Jordan [name] says she is feeling better. She has no new  complaints.   Her physical examination today shows that she is much more alert, more  oriented, better able to get around. She has no nausea or vomiting. She is  off the enema. She wants to get her IVs out, and she is hopeful of being  able to go home perhaps as early as tomorrow. She had been up all day. Her  physical examination today shows her chest is quite clear. Her abdomen is  much softer. She has trace edema only. Her CNS is grossly intact, and her  temp is 97.4, pulse 87, respirations 16, blood pressure 146/90. O2 sat is  94% on room air.   Potassium is 3 so that is being replaced.   ASSESSMENT:  She is much improved.   PLAN:  We are working on trying to get discharge arranged. I do not plan any  new treatments today.      Edward L. Luan Pulling, M.D.  Electronically Signed     ELH/MEDQ  D:  11/17/2005  T:  11/18/2005  Job:  [id number]

## 2011-01-24 NOTE — Group Therapy Note (Signed)
NAME:  Jordan [name], Jordan [name]                 ACCOUNT NO.:  [id number]   MEDICAL RECORD NO.:  [id number]          PATIENT TYPE:  INP   LOCATION:  A303                          FACILITY:  [facility]   PHYSICIAN:  Edward L. Luan Pulling, M.D.DATE OF BIRTH:  1946-04-29   DATE OF PROCEDURE:  DATE OF DISCHARGE:  11/18/2005                                   PROGRESS NOTE   Mrs. Thorson is overall much improved, feels better and wants to go home. She  has no complaints today.  She is weak, but she expects that. She has no  other new problems.   Her physical exam shows that her chest is clear. Her heart is regular. Her  abdomen is soft, somewhat protuberant. Extremities showed no edema. CNS is  grossly intact.   Assessment is that she has had Clostridium difficile colitis, but she is  much improved.   Plan is to discharge her home today. Please see discharge summary for  details.      Edward L. Luan Pulling, M.D.  Electronically Signed     ELH/MEDQ  D:  11/18/2005  T:  11/19/2005  Job:  [id number]

## 2011-01-24 NOTE — H&P (Signed)
NAME:  [name] [name]                 ACCOUNT NO.:  [id number]   MEDICAL RECORD NO.:  [id number]          PATIENT TYPE:  INP   LOCATION:  IC09                          FACILITY:  [facility]   PHYSICIAN:  Edward L. Luan Pulling, M.D.DATE OF BIRTH:  05/26/1946   DATE OF ADMISSION:  07/29/2006  DATE OF DISCHARGE:  LH                                HISTORY & PHYSICAL   REASON FOR ADMISSION:  Asthma with exacerbation.   HISTORY:  [name] [name] is a 65 year old with a long known history of severe  asthma.  She has been following in my office and also at [facility].  She had been  doing relatively well, said that she got sick about a week ago and came to  my office today.  When she came to my office, she had more problems with  shortness of breath.  She was started on oxygen, given a nebulizer treatment  but did not clear at all, and plans were made for hospitalization.  She  seemed to get worse, became more short of breath, said that she had no  breath at all, and she was brought on call to EMS.  She was brought to the  intensive care unit by ambulance.  She has had multiple exacerbations of her  asthma/COPD, including a severe exacerbation December 2006.  It culminated  in her being intubated and placed on mechanical ventilation.  This is a  prolonged intubation mechanical ventilation.  She later had an episode of  Clostridium difficile colitis, probably related to the antibiotic she  received during that hospitalization.  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  She had the episode of C difficile  colitis.  Surgically, she has had a oophorectomy, C-spine surgery, Nissen  fundoplication.  She had a concussion about 2 years ago with a spinal fluid  leak.  She has had arthroscopic knee surgery and an open knee surgery as  well.  Her medications are not known at this point and will be re-visited.   FAMILY HISTORY:  Positive for osteoporosis and asthma.  Mother died in 27.  Father died in his 1s of asthma and congestive heart failure.  There was a  history of diabetes in the family.   SOCIAL HISTORY:  She is divorced.  She is not working, having retired.  She  worked in Science writer.  She does drink alcohol at social situations.   REVIEW OF SYSTEMS:  Otherwise pretty much negative.   PHYSICAL EXAMINATION:  GENERAL:  She appears to be acutely ill.  VITAL SIGNS:  Heart rate is 102, blood pressure 105/66, O2 sat 94% on room  air on 2 liters, respirations 23.  HEENT:  Her pupils are reactive to light and accommodation.  Her nose and  throat are clear.  Mucous membranes are slightly dry.  Her tympanic  membranes are intact.  CHEST:  With decreased breath sounds and marked bilateral wheezing,  prolonged expiration.  HEART:  Regular without gallop now.  ABDOMEN:  Soft without masses.  EXTREMITIES:  Showed trace edema.  CNS:  Grossly intact.   LABORATORY:  Her lab work is pending.   CHEST X-RAY:  Pending.   ASSESSMENT:  She has a severe exacerbation of asthma.  I am hopeful that she  can avoid having been intubated this time.  Labs as mentioned are pending.      Edward L. Luan Pulling, M.D.  Electronically Signed     ELH/MEDQ  D:  07/29/2006  T:  07/29/2006  Job:  [id number]

## 2011-01-24 NOTE — Op Note (Signed)
Jordan [name], HOERNER                 ACCOUNT NO.:  [id number]   MEDICAL RECORD NO.:  [id number]          PATIENT TYPE:  INP   LOCATION:  A303                          FACILITY:  [facility]   PHYSICIAN:  Caro Hight, M.D.      DATE OF BIRTH:  1945-11-05   DATE OF PROCEDURE:  11/08/2005  DATE OF DISCHARGE:                                 OPERATIVE REPORT   PROCEDURE:  Limited colonoscopy.   INDICATIONS:  Ms. Soth is a 65 year old female who presented with acute  onset of diarrhea. She was clostridium difficile on admission. She  subsequently developed worsening abdominal distention which was unresponsive  to NG decompression or rectal tube decompression. Her acute abdominal series  today shows her transverse colon is dilated to 12 cm. She is currently  afebrile with a white count of 25,000.   FINDINGS:  Pseudo membranes, continuous from the anal verge to 90 cm. Unable  to clearly identify the hepatic flexure or the splenic flexure due to the  presence of pseudomembranes obscuring a well-defined mucosal view. The lumen  did not appear as distended grossly as it appeared on the plain film. All  air was suctioned from the bowel lumen to a point of collapse upon  withdrawing the scope.   ENDOSCOPIC DIAGNOSIS:  Clostridium difficile colitis.   RECOMMENDATIONS:  1.  Would continue Flagyl IV and vancomycin p.o.  2.  N.p.o. except medications and ice chips.  3.  Serial abdominal exams and pain scales every four hours.  4.  Consider surgical consultation if developed signs and symptoms of toxic      megacolon.   MEDICATIONS:  1.  Demerol 25 mg IV.  2.  Versed 4 mg IV.   PROCEDURE TECHNIQUE:  A physical exam was performed. Informed consent was  obtained from the patient after explaining all of the risks (perforation,  bleeding, infection, aspiration and adverse affects to the medicines),  benefits and alternatives to the procedure which the patient appeared to  understand and so stated. The  patient was connected to the monitoring device  and placed in the left lateral position. Continuous oxygen was provided with  nasal cannula and IV medications administered through an indwelling cannula.  After administration of sedation as allowed, the patient was intubated and  the scope was advanced under direct visualization to 90 cm from the anal  verge.   The exact visual landmark was challenging to identify. The scope was  subsequently removed slowly while carefully examining the color, texture,  anatomy and integrity of the mucosa on the way out. The patient was  subsequently recovered in endoscopy in satisfactory condition.      Caro Hight, M.D.  Electronically Signed     SM/MEDQ  D:  11/09/2005  T:  11/10/2005  Job:  [id number]

## 2011-01-24 NOTE — Discharge Summary (Signed)
NAME:  Jordan [name], Jordan [name]                 ACCOUNT NO.:  [id number]   MEDICAL RECORD NO.:  [id number]          PATIENT TYPE:  INP   LOCATION:  A303                          FACILITY:  [facility]   PHYSICIAN:  Edward L. Luan Pulling, M.D.DATE OF BIRTH:  01-18-1946   DATE OF ADMISSION:  11/05/2005  DATE OF DISCHARGE:  03/13/2007LH                                 DISCHARGE SUMMARY   FINAL DISCHARGE DIAGNOSES:  1.  Clostridium difficile colitis.  2.  Status post prolonged hospitalization and ventilator support for      respiratory failure.  3.  Asthma.  4.  Status post recent myocardial infarction.  5.  Acute renal failure related to her Clostridium difficile colitis.  6.  Multiple electrolyte abnormalities relating to her Clostridium difficile      colitis.  7.  Hypertension.   HISTORY:  Ms. Fistler is a 65 year old who has a long known history of  asthma/COPD and who had been hospitalized in December 2006 and January 2007  with respiratory failure.  She had a very prolonged hospitalization, she was  intubated and ventilated for a long period of time, received multiple  antibiotics, etc., and also at that time apparently had a myocardial  infarction.  She was treated, eventually improved and released.  She came to  my office on the day of admission complaining of abdominal discomfort,  nausea, diarrhea and cramping.  When she was evaluated in my office, it was  fairly clear that she was again acutely ill.  I had her undergo a CT abdomen  to make sure that we were not missing a perforated disk, etc., and this  showed what appeared to be pancolitis.  This was felt to almost certainly  be related to Clostridium difficile considering her prolonged  hospitalization, etc., and she was treated initially with Flagyl.  GI  consultation was obtained.  Initially she had a moderate elevation of her  white blood cell count and normal renal function.  She continued having  significant diarrhea and abdominal pain  through the next several days,  consultation as mentioned was obtained with the GI team, and they felt that  she did have Clostridium difficile colitis and that she should be treated  for that and continue with Flagyl at the moment.  However over the next  several days she got worse, developed a severe ileus related to this and she  was switched at that time to oral vancomycin.  She continued very slow  improvement, developed what appeared to be some acute renal failure and  hypokalemia probably related to the colitis, IV fluids, etc.  She had some  fluid retention which I believe is also related to her colitis because of  lack of water reabsorption by the colon.  She had an endoscopy done which  showed evidence of the colitis.  She had an NG tube placed which did not  seem to help very much.  She was placed on vancomycin enemas and that seemed  to make a difference.  Over the next several days after she started  vancomycin enemas she improved to  the point that she could eat, was able to  ambulate, was much improved and was eventually to the point that she could  be discharged home.  She was discharged home in improved condition, still  not totally cured, to continue with her previous medications which included  lisinopril 20 mg daily, Lipitor 40 mg daily, Singulair 10 mg daily, Advair  250/50 one puff twice daily, Spiriva one inhalation daily and she was to be  on vancomycin orally 250 mg daily four times a day x10 days then three times  a day for 1 week, twice a day for 1 week, once a day for 1 week and then  stop.  She is to take [facility] one daily for 3 months, try to void any  antibiotics.  She is also to take prednisone 10 mg daily which is another  one of her chronic medications.  She was given Lasix 40 mg as needed for  fluid and she is going to have potassium chloride 20 mEq twice daily on days  that she needs to  take the Lasix.  We discussed home health services.  She really does  not  want to do that.  She feels like she is going to be able to manage at home  with help from friends and family.  She is going to follow up in my office  in about 2 weeks.      Edward L. Luan Pulling, M.D.  Electronically Signed     ELH/MEDQ  D:  11/18/2005  T:  11/19/2005  Job:  [id number]   cc:   R. Garfield Cornea, M.D.  [address]  [place]  [place] [address]   Hildred Laser, M.D.  [address]  [place]  [place] [address]

## 2011-01-24 NOTE — H&P (Signed)
NAME:  Jordan [name], Jordan [name]                 ACCOUNT NO.:  [id number]   MEDICAL RECORD NO.:  [id number]          PATIENT TYPE:  INP   LOCATION:  IC04                          FACILITY:  [facility]   PHYSICIAN:  R. Garfield Cornea, M.D. DATE OF BIRTH:  [birth year]   DATE OF ADMISSION:  08/31/2005  DATE OF DISCHARGE:  12/29/2006LH                                HISTORY & PHYSICAL   CONTINUATION:   CURRENT MEDICATIONS:  1.  Prozac 40 mg daily.  2.  Singulair 10 mg daily.  3.  Prednisone 10 mg daily.  4.  Lipitor 40 mg daily.  5.  Lisinopril 20 mg daily.  6.  Spiriva daily.  7.  Advair daily.   ALLERGIES:  1.  SULFA.  2.  ASPIRIN.  3.  BARBITURATES.   FAMILY HISTORY:  No history of colon cancer or IBD.   SOCIAL HISTORY:  The patient is divorced.  She is retired from the banking  injury.  Rarely consumes alcohol.  No tobacco.   REVIEW OF SYSTEMS:  As in history of present illness.   PHYSICAL EXAMINATION:  GENERAL:  A pleasant 65 year old lady resting  comfortably in the hospital bed.  VITAL SIGNS:  Temperature 98, pulse 106, respiratory rate 20, blood pressure  104/52.  SKIN:  Warm and dry.  There is no jaundice or indication of chronic liver  disease.  Conjunctivae are pink.  CHEST:  Lungs are clear with distant breath sounds.  CARDIAC:  Regular rate and rhythm without murmur, gallop, or rub.  ABDOMEN:  Full.  She has very good bowel sounds.  Abdomen is diffusely  tender.  She guards voluntarily.  There is no rebound tenderness.  No  obvious mass or organomegaly.  EXTREMITIES:  No edema.   LABORATORY DATA:  White count 18,800, hemoglobin and hematocrit 10.2 and  30.2, MCV 84.7.  She has 89% polys.  Sodium 134, potassium 3.5, chloride  106, CO2 of 25, glucose 140, BUN 18, creatinine 1.1, total bilirubin 0.5.  Alkaline phosphatase is 73.  AST of 16, ALT 13, albumin 2.5.  Calcium 8.3.   IMPRESSION:  Jordan [name] is a 65 year old lady with multiple medical  problems recently hospitalized  for a prolonged period of time here and  subsequently at [facility] with respiratory failure.  It appears that she has  recovered; however, she has had abdominal pain over the past one month and  now non-bloody diarrhea and tenesmus for the better part of the last week.  CT scan reveals a pancolitis, highly suggestive of pseudomembranous colitis.   She has a leukocytosis.  She looks as though she is ill, but not extremely  toxic.   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   RECOMMENDATIONS:  1.  Will stop her IV Flagyl and begin p.o. Flagyl 250 mg orally q.i.d.  Will      go ahead and add some Questran 4 gm orally to her regimen daily, not to      be given within 2 hours of other medications.  Add __________ one      capsule daily.  Will start her on some Protonix 40 mg orally daily      empirically.  2.  We need to be cautious with narcotics, as narcotic therapy would      increase her risk of megacolon.  I have increased her interval on the      morphine from 2 mg IV q.2h. to q.4h.  3.  Stool studies have already been submitted.  I am okay with allowing her      to stay on Cipro for the time being, but we may need to adjust her      antibiotics further in the very near future.  As a separate issue, would      get back on track for her to have an elective colonoscopy at some point      once she is over her acute illness.  I see no need to perform lower      endoscopy at the current time.   I would like to thank Dr. Jaquita Rector __________ for letting me see this nice lady  today in consultation.      Bridgette Habermann, M.D.  Electronically Signed     RMR/MEDQ  D:  11/05/2005  T:  11/05/2005  Job:  [id number]

## 2011-01-24 NOTE — Group Therapy Note (Signed)
NAME:  Jordan [name], Jordan [name]                 ACCOUNT NO.:  [id number]   MEDICAL RECORD NO.:  [id number]          PATIENT TYPE:  INP   LOCATION:  A303                          FACILITY:  [facility]   PHYSICIAN:  Edward L. Luan Pulling, M.D.DATE OF BIRTH:  1946-01-21   DATE OF PROCEDURE:  11/11/2005  DATE OF DISCHARGE:                                   PROGRESS NOTE   SUBJECTIVE:  Jordan [name] says that she is a slightly better today.  She is a  little concerned because she has some swelling of her legs.  She has no  other new complaints.   OBJECTIVE:  VITAL SIGNS:  Physical examination shows her temperature is  97.9, pulse 107, respirations 18, blood pressure 178/105.  This is during an  episode of pain.  O2 saturations 96%.  ABDOMEN:  Her abdomen is still distended and soft.   White blood count 15,700, hemoglobin is 11.6, platelets 317,000.  Potassium  3.4, CO2 is 18, BUN 19, creatinine 1.3.  She does have 1+ edema of her legs.   ASSESSMENT:  She is still slowly improving.   PLAN:  Plan is to decrease her IV fluids slightly.  Add some potassium to  her IV fluids.  Continue with all the other treatments and follow.      Edward L. Luan Pulling, M.D.  Electronically Signed     ELH/MEDQ  D:  11/11/2005  T:  11/11/2005  Job:  [id number]

## 2011-01-24 NOTE — Op Note (Signed)
NAME:  Jordan [name], Jordan [name]                 ACCOUNT NO.:  [id number]   MEDICAL RECORD NO.:  [id number]          PATIENT TYPE:  AMB   LOCATION:  DAY                           FACILITY:  [facility]   PHYSICIAN:  R. Garfield Cornea, M.D. DATE OF BIRTH:  1946-03-10   DATE OF PROCEDURE:  04/07/2006  DATE OF DISCHARGE:  04/07/2006                                 OPERATIVE REPORT   PROCEDURE:  A 48-hour Bravo pH study.   INDICATIONS FOR PROCEDURE:  The patient is a 65 year old lady status post  lap Nissen fundoplication with re-do at [facility] a couple of years ago, seen to  be having some LPR symptoms.  She been seen by Dr. Redmond Baseman down in [place].  There was concern for LPR.  She had been on Nexium 40 mg orally daily.  Does  not really have much in the way of typical reflux symptoms.  She had a pH  probe placed at the time of her endoscopy on April 07, 2006.  The mucosa of  her esophagus appeared normal.   Interpretation of study:  On day one, total number of reflux episodes 43,  number of longest reflux episodes greater than five minutes, 1.  Duration of  longest reflux episodes 18.  Time pH less than 4 in minute 37, fraction of  time pH less than 4, 2.8 with a DeMeester score of 11.8.   Day two analysis:  Number of reflux episodes 6, duration of longest reflux  episode in minutes 4, time pH less than 4 minutes 6, fraction time pH less  than 4 at 0.5, DeMeester score 2.2.   [name] had relatively few episodes of gastroesophageal reflux during the  study as sensed by the probe.  She only had a few episodes of heartburn  which correlated poorly with actual episodes of reflux.   Overall, the study was within normal limits for physiological reflux.  The  significance of any gastroesophageal reflux disease in a setting of prior  fundoplication is uncertain.   We will be  recommending to you [name] should follow up with Dr. Redmond Baseman in  regards to further evaluation of her ENT symptoms.      Bridgette Habermann, M.D.  Electronically Signed     RMR/MEDQ  D:  04/16/2006  T:  04/16/2006  Job:  [id number]   cc:   Onnie Graham, MD  Fax: [place]. Luan Pulling, M.D.  Fax: [phone]

## 2011-01-24 NOTE — Group Therapy Note (Signed)
NAME:  Jordan [name], Jordan [name]                 ACCOUNT NO.:  [id number]   MEDICAL RECORD NO.:  [id number]          PATIENT TYPE:  INP   LOCATION:  A303                          FACILITY:  [facility]   PHYSICIAN:  Edward L. Luan Pulling, M.D.DATE OF BIRTH:  Jul 23, 1946   DATE OF PROCEDURE:  11/08/2005  DATE OF DISCHARGE:                                   PROGRESS NOTE   PROBLEM:  1.  Clostridium difficile colitis.  2.  Asthma.  3.  Acute renal dysfunction.   SUBJECTIVE:  [name] says she is feeling a little better. She had a very  small amount of stool this morning. Otherwise everything is about the same.  She still has significant abdominal discomfort. She feels like she has a lot  of gas.   OBJECTIVE:  VITAL SIGNS:  Her physical exam shows her temperature 97.8,  pulse 106, respirations 20, blood pressure 125/78, O2 saturation 99% on room  air.  CHEST:  Her chest is clear.  HEART:  Heart is regular.  ABDOMEN:  Her abdomen is soft.   White count has come down to 24,900 from 34,200, hemoglobin 9.7. Sodium 132,  potassium 3.9, chloride 104, CO2 16, BUN 33, creatinine 2.6.   ASSESSMENT:  She is obviously quite sick with this, but her white count has  come down a little bit, and she actually looks a little better.   PLAN:  My plan then is to go ahead and get another flat and upright  abdominal film today, flat and upright abdominal film tomorrow. Repeat her  labs tomorrow. I am going probably going to increase her IV fluids slightly  because of her renal dysfunction. I suspect that her renal dysfunction is  because of her colitis as well.      Edward L. Luan Pulling, M.D.  Electronically Signed     ELH/MEDQ  D:  11/08/2005  T:  11/08/2005  Job:  [id number]

## 2011-01-24 NOTE — Discharge Summary (Signed)
NAME:  Jordan [name], Jordan [name]                 ACCOUNT NO.:  [id number]   MEDICAL RECORD NO.:  [id number]          PATIENT TYPE:  INP   LOCATION:  A223                          FACILITY:  [facility]   PHYSICIAN:  Edward L. Luan Pulling, M.D.DATE OF BIRTH:  11/12/1945   DATE OF ADMISSION:  07/29/2006  DATE OF DISCHARGE:  LH                               DISCHARGE SUMMARY   FINAL DISCHARGE DIAGNOSES:  1. Asthma/chronic obstructive pulmonary disease with exacerbation.  2. Supraventricular tachycardia.  3. History of myocardial infarction without coronary artery occlusive      disease.  4. History of respiratory failure requiring mechanical ventilation.  5. History of Clostridium difficile colitis.  6. Hypertension.  7. Osteoporosis.  8. Depression.  9. 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   HISTORY:  Jordan [name] is a 65 year old who has a history of severe asthma.  She had been following in my office and also at [facility].  About a week ago,  she got sick and came to my office.  When she came to my office, she  said that she was having more shortness of breath.  She was given an  oxygen and nebulizer treatment, did not get any better, and we were  planning for hospitalization.  Then she got even worse than that and  ended up being sent over here by EMS to the intensive care unit.   PHYSICAL EXAMINATION:  GENERAL:  She appeared to be acutely ill.  VITAL SIGNS:  Heart rate 102, blood pressure 105/66, O2 sat 94% on 2  liters, respirations 23.  CHEST:  Her chest showed decreased breath sounds, marked bilateral  wheezing, prolonged expiration.  EXTREMITIES:  Trace edema.  CNS:  Intact.   HOSPITAL COURSE:  She was watched in the intensive care unit and had a  somewhat waxing and waning course but eventually improved enough to be  moved out of the intensive care unit.  She continued to have wheezing,  which was significant, and then she was improved enough that she was  able to be discharged home.  Her blood  sugars have been up somewhat,  which I think is steroid related.   DISCHARGE MEDICATIONS:  I am going to send her home on:  1. Prozac 40 mg daily.  2. Lipitor 20 mg daily.  3. Prednisone 40 mg x2 days, 30 mg  x2 days, 20 mg x2 days, 10 mg x2      days and then stop.  4. She is going to stop lisinopril.  5. She is going to start Cardizem CD 240 one daily.  6. We are going to stop the Xolair injections; she feels like these      have not helped at all.  7. She is going to have Tussionex for cough.  8. She has a Glucometer at home, is going to check her blood sugars,      and she has Glucotrol available.  I am going to have her use the      Glucotrol if her blood sugar is greater than about 150 fasting.      Jordan [name]  Jordan [name], M.D.  Electronically Signed     ELH/MEDQ  D:  08/05/2006  T:  08/05/2006  Job:  [id number]

## 2011-01-24 NOTE — H&P (Signed)
NAME:  Jordan [name], Jordan [name]                 ACCOUNT NO.:  [id number]   MEDICAL RECORD NO.:  [id number]          PATIENT TYPE:  INP   LOCATION:  A303                          FACILITY:  [facility]   PHYSICIAN:  Edward L. Luan Pulling, M.D.DATE OF BIRTH:  [birth year]   DATE OF ADMISSION:  11/05/2005  DATE OF DISCHARGE:  LH                                HISTORY & PHYSICAL   CHIEF COMPLAINT:  Abdominal pain.   HISTORY OF PRESENT ILLNESS:  Jordan [name] is a 65 year old who had severe  episodes of abdominal discomfort.  She was hospitalized in December here and  then at [facility] with respiratory failure, requiring ventilator  support.  She has been home now for about a month.  She is doing fairly  well, but then developed abdominal discomfort that has been going on for  several days.  It got much worse last night.  She went to see Dr. Glo Herring  and was evaluated there and found to have severe lower abdominal pain, was  sent to my office.  When she was seen in my office she looked acutely ill  and it was felt that she needed to be admitted.  Concerns were whether she  had C. difficile which was the first concern or whether she had possibly  diverticulitis or some other intra-abdominal problem.  She had been on broad  spectrum antibiotics.   PAST MEDICAL HISTORY:  1.  Positive for severe asthma, the episode of respiratory failure in      December of this year.  2.  She has a history of supraventricular tachycardia.  3.  Hypertension.  4.  Osteoporosis.  5.  Depression.   PAST SURGICAL HISTORY:  1.  She has had an oophorectomy.  2.  C-spine surgery.  3.  Nissen fundoplication x2.  4.  She had a concussion about a year ago that did not require surgery but      required her to be admitted at [facility].  5.  She has had arthroscopic knee and open knee surgery.   FAMILY HISTORY:  Positive for COPD in multiple family members.   MEDICATIONS:  1.  Prozac 40 mg daily.  2.  Singulair  10 mg daily.  3.  Prednisone 10 mg daily.  4.  Lipitor 40 mg daily.  5.  Lisinopril 20 mg daily.  6.  Spiriva one inhalation daily.  7.  Advair 250/50 one puff b.i.d.   ALLERGIES:  1.  She is allergic to SULFA.  2.  ASPIRIN.  3.  BARBITURATES.   SOCIAL HISTORY:  She is divorced.  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  She uses alcohol occasionally.  She does not smoke.   PHYSICAL EXAMINATION:  GENERAL:  Examination shows that she is a well-  developed, well-nourished, Caucasian female who appears to be in moderate  acute distress.  VITAL SIGNS:  Temperature 98, pulse 106, respirations 20, blood pressure  104/52, O2 saturations 99% .  HEENT:  Her pupils are reactive to light and accommodation.  Her nose and  throat are clear.  Her mucous membranes are dry.  NECK:  Supple without masses.  CHEST:  Clear without wheezes, rales, or rhonchi.  HEART:  Regular without murmur, gallop, or rub.  ABDOMEN:  Soft with diffuse tenderness, most marked in the lower quadrants.  EXTREMITIES:  Showed no edema.  CNS was grossly intact.   LABORATORY DATA:  White count is 18,800, hemoglobin 10.2.  Electrolytes are  normal.  Glucose 140, albumin 2.5.   ASSESSMENT:  She got abdominal pain the lower quadrants, probably C.  Difficile, but of course there is a possibility of other problems, and I am  going to go ahead and get an antibiotic started until we have a better  handle on exactly what we are dealing with.  Then I am going to ask for GI  consultation and continue with her other medications and follow.      Edward L. Luan Pulling, M.D.  Electronically Signed     ELH/MEDQ  D:  11/05/2005  T:  11/05/2005  Job:  [id number]

## 2011-01-24 NOTE — Group Therapy Note (Signed)
NAME:  Jordan [name], Jordan [name]                 ACCOUNT NO.:  [id number]   MEDICAL RECORD NO.:  [id number]          PATIENT TYPE:  INP   LOCATION:  A225                          FACILITY:  [facility]   PHYSICIAN:  Edward L. Luan Pulling, M.D.DATE OF BIRTH:  02/16/1946   DATE OF PROCEDURE:  09/01/2005  DATE OF DISCHARGE:                                   PROGRESS NOTE   PROBLEM:  Asthma with severe exacerbation.   SUBJECTIVE:  Jordan [name] has had continued difficulty. During the night last  night she was much more short of breath and I gave her a continuous neb  treatment. She had a blood gas done that showed that she has a slightly  elevated pCO2 and a slightly decreased pH indicating that she may have been  tiring. Since then she seems to have improved some but she remains short of  breath.   OBJECTIVE:  Her temperature now is 98.4, pulse about 110, respirations about  25, blood pressure 149/90, O2 saturation is 92% on 40%. Her chest still  shows wheezes but I think she is moving air at least slightly better than  yesterday. Her heart is regular with a tachycardia. Her abdomen soft. The  blood gas yesterday showed a pO2 of 124, pCO2 of 47, pH of 7.31 compared to  the admission of 7.39, 41 and 61.8, however, as mentioned I do think she is  a bit better than that now.   ASSESSMENT AND PLAN:  My plan then is to switch her from the Xopenex, her  heart rate is a little bit better so I am going to put on albuterol  treatments every 4 hours. She says that she thinks that the Spiriva makes a  big difference in her symptoms and I have told her that generally it is  designed for COPD not for asthma but if it seems to help her let us try it.  I am hopeful to avoid having to intubate her and place her on mechanical  ventilation but she is quite sick. We will plan to continue with medications  and treatments otherwise and follow. We will get a theophylline level in the  morning and other labs in the morning.      Edward L. Luan Pulling, M.D.  Electronically Signed     ELH/MEDQ  D:  09/01/2005  T:  09/01/2005  Job:  [id number]

## 2011-01-24 NOTE — Group Therapy Note (Signed)
NAME:  Jordan [name], HIRN                 ACCOUNT NO.:  [id number]   MEDICAL RECORD NO.:  [id number]          PATIENT TYPE:  INP   LOCATION:  IC09                          FACILITY:  [facility]   PHYSICIAN:  Edward L. Luan Pulling, M.D.DATE OF BIRTH:  03-18-1946   DATE OF PROCEDURE:  07/30/2006  DATE OF DISCHARGE:                                   PROGRESS NOTE   Ms. Bier was admitted yesterday with exacerbation of COPD and asthma.  She  is better, but she has had bursts of tachycardia, and at her last admission  when she had severe respiratory failure and actually eventually was  transferred to [facility], she had an acute myocardial infarction while she was  hospitalized at [facility].  She has very slight increase in her cardiac enzymes,  not a great deal, and says that she is feeling better.   PHYSICAL EXAMINATION THIS MORNING:  GENERAL:  Shows she is awake and alert.  She is coughing.  VITAL SIGNS:  Heart rate now on the 80s but gets up in the 100-range when  she is coughing.  Blood pressure 102/51, O2 saturations 100%, respirations  about 20.  HEENT:  Her pupils are reactive.  Her nose and throat are clear.  CHEST:  Does show some rhonchi and wheezes.  HEART:  Regular without gallop.  ABDOMEN:  Soft.  EXTREMITIES:  Showed no edema.   LABORATORY WORK:  Her cardiac panel now shows CK 93, MB of 6.3, and troponin  of 0.42.  I have discussed her situation by telephone with Dr. Jacqulyn Ducking and we both feel that she can be managed here effectively.      Edward L. Luan Pulling, M.D.  Electronically Signed     ELH/MEDQ  D:  07/30/2006  T:  07/30/2006  Job:  [id number]

## 2011-01-24 NOTE — Group Therapy Note (Signed)
NAME:  Jordan [name], Jordan [name]                 ACCOUNT NO.:  [id number]   MEDICAL RECORD NO.:  [id number]          PATIENT TYPE:  INP   LOCATION:  A223                          FACILITY:  [facility]   PHYSICIAN:  Edward L. Luan Pulling, M.D.DATE OF BIRTH:  1945/11/04   DATE OF PROCEDURE:  08/05/2006  DATE OF DISCHARGE:                                 PROGRESS NOTE   Ms. Dagle is overall much improved.  She has no new complaints.  She is  not short of breath, and I think she is now ready for discharge.   Her exam today shows her temperature is 96.9, pulse 79, respirations 20,  blood sugar 237, blood pressure 128/68, O2 saturation 94% on room air.  Her chest is much clearer.  Her heart is regular.   ASSESSMENT:  She is better.   PLAN:  Is to have her discharged today.  Please see discharge summary  for details.      Edward L. Luan Pulling, M.D.  Electronically Signed     ELH/MEDQ  D:  08/05/2006  T:  08/05/2006  Job:  [id number]

## 2011-01-24 NOTE — Group Therapy Note (Signed)
NAME:  Jordan [name], Jordan [name]                 ACCOUNT NO.:  [id number]   MEDICAL RECORD NO.:  [id number]          PATIENT TYPE:  INP   LOCATION:  A303                          FACILITY:  [facility]   PHYSICIAN:  Delphina Cahill, M.D.        DATE OF BIRTH:  1945/11/13   DATE OF PROCEDURE:  11/16/2005  DATE OF DISCHARGE:                                   PROGRESS NOTE   SUBJECTIVE:  [name] is feeling much better at this time.  Tolerated a  regular diet without any nausea or vomiting.  Did have some loose bowel  movement, but appeared to be having more consistency and substance to them.  No significant watery diarrhea complaints, no abdominal pain.  The patient  is ambulating well.   OBJECTIVE:  VITAL SIGNS:  Temperature 98.3, blood pressures have ranged  between 178/109 and 165/107, pulse 79.  She is 95% on room air.  GENERAL:  This is a white female in no acute distress lying in bed.  HEENT:  Pupils equal round and reactive to light and accommodation.  CHEST:  Clear without crackles or wheezing.  HEART:  Regular rate and rhythm with no murmurs, rubs or gallops.  ABDOMEN:  Soft, but protuberant, positive bowel sounds, but nontender.  EXTREMITIES:  2+ pitting edema in the lower extremities, but some improved  from yesterday.  NEUROLOGIC:  No deficits appreciated.   Lab work shows white count of 10.6, hemoglobin 10.2, platelet count 223.  BMET with sodium 143, potassium 2.9, chloride 116, CO2 21, glucose 148, BUN  7, creatinine 1.2, calcium 8.2.   ASSESSMENT:  This is a 65 year old, white female with Clostridium difficile  colitis who has continued to improve.   PLAN:  1.  Clostridium difficile colitis.  She is being seen and followed by Dr.      Gala Romney and he has made changes to her medications stopping her Flagyl and      Questran and given a regimen for continuing on her vancomycin at this      time.  2.  Hypokalemia, probable secondary to loose bowel movements as well as      Lasix.  Replete with  40 mEq p.o. x1 and repeat in 2 hours x2 doses.      Will also add a magnesium level on a.m. labs to see if this needs to be      repleted as well.  3.  Lower extremity edema.  The patient does have a significant amount of      lower extremity edema and this may be secondary to all the medicines      that she was previously on including the hydrocortisone.  Will continue      her on her daily Lasix.  At this time, she is having a considerable      amount of output per her report.  4.  Hypertension.  Her blood pressure continues to be elevated.  Unclear if      this is related to the hydrocortisone.      Will titrate up her lisinopril  to 40 mg daily for this.  She likely may      need some other blood pressure medicine in addition to this.  5.  The patient will be seen and evaluated by Dr. Sinda Du tomorrow.      Delphina Cahill, M.D.  Electronically Signed     ZH/MEDQ  D:  11/16/2005  T:  11/17/2005  Job:  [id number]

## 2011-01-24 NOTE — Op Note (Signed)
NAME:  SUNG, GAVAGHAN                 ACCOUNT NO.:  [id number]   MEDICAL RECORD NO.:  [id number]          PATIENT TYPE:  AMB   LOCATION:  DAY                           FACILITY:  [facility]   PHYSICIAN:  R. Garfield Cornea, M.D. DATE OF BIRTH:  July 16, 1946   DATE OF PROCEDURE:  DATE OF DISCHARGE:                                 OPERATIVE REPORT   PROCEDURE:  Esophagogastroduodenoscopy with Bravo pH probe placement.   ENDOSCOPIST:  Cristopher Estimable. Rourk, M.D.   INDICATIONS FOR PROCEDURE:  The patient is a 65 year old with asthma  referred for a Bravo pH study.  She has history of gastroesophageal reflux  disease, status post Nissen fundoplication by Dr. Arnoldo Morale here several years  ago.  She had a good result for a few years, but then some of her typical  reflux symptoms have recurred.  She is not currently on any acid suppression  therapy.  She is followed by Dr. Sullivan Lone down at [facility] as well as  Dr. Lise Auer.  There is some consideration for redoing her Nissen, but  more information needs to be gathered.   PROCEDURE NOTE:  Demerol 125 mg IV, Versed 4 mg IV in divided doses.   INSTRUMENT:  Olympus video chip system.   FINDINGS:  Examination of the tubular esophagus revealed no mucosal  abnormalities.  The EG junction was easily traversed.   STOMACH:  The gastric cavity was empty.  It insufflated well with air.  A  thorough examination of the gastric mucosa including a retroflex view of the  proximal stomach and esophagogastric junction demonstrated the gastric  mucosa appeared normal.  However, in the retroflex position the patient had  some stigmata of some prior fundoplication; but this was, at best, only a  partial wrap and appeared to have grossly slipped.  There did not appear to  be any bolstering effect at the EG junction whatsoever.  Also there was a  small hiatal hernia.  The pylorus was patent and easily traversed.  Examination of the bulb and second portion revealed no  abnormalities.   THERAPEUTIC/DIAGNOSTIC MANEUVERS:  The distance between the squamocolumnar  junction was measured with insufflated and decompressed esophagus (43 cm).  The scope was removed.  The Bravo probe, introducer device was advanced,  finally into the esophagus.  The probes traversing the [place] was confirmed with  examination of the hypopharynx with the scope.  The probe was positioned 36  cm from the incisors.  It was deployed.  A lookback revealed the probe to be  satisfactorily anchored to the esophageal mucosa.   The patient tolerated the procedure well throughout the endoscopy.   IMPRESSION:  1.  Normal esophagus.  2.  Apparent slipped Nissen fundoplication.  3.  Small hiatal hernia, otherwise normal gastric mucosa with normal D1-D2.  4.  Status post placement of a Bravo pH probe at 36 cm from the incisors.   RECOMMENDATIONS:  1.  The patient will return the first of next week for downloading and      analysis of the pH study.  2.  Further recommendations  to follow.     Elvera Bicker   RMR/MEDQ  D:  07/12/2004  T:  07/13/2004  Job:  [id number]   cc:   Percell Miller L. Luan Pulling, M.D.  [place]  [place] [address]  Fax: [phone]   [facility] M.D. Manus Rudd, M.D.  [facility]

## 2011-01-24 NOTE — Group Therapy Note (Signed)
NAME:  RYELYNN, PORTEN                 ACCOUNT NO.:  [id number]   MEDICAL RECORD NO.:  [id number]          PATIENT TYPE:  INP   LOCATION:  A303                          FACILITY:  [facility]   PHYSICIAN:  Edward L. Luan Pulling, M.D.DATE OF BIRTH:  [birth year]   DATE OF PROCEDURE:  11/10/2005  DATE OF DISCHARGE:                                   PROGRESS NOTE   SUBJECTIVE:  Ms. Dinga says she is feeling a little better.  She has had a  slight amount of bowel movement.  Dr. Laural Golden has said that he is going to  try her on some yogurt.   OBJECTIVE:  VITAL SIGNS:  Her physical examination otherwise shows her  temperature is 97.1, pulse 82, respirations 20, blood pressure 143/75, O2  saturations 97% on room air.  ABDOMEN:  Her abdomen remains distended, but fairly soft.   Her white blood count 14,400 which is down and her electrolytes shows her  BUN is 25, creatinine 1.4 now.  Her CO2 is 20 which is now normal.   ASSESSMENT:  She is better.   PLAN:  Plan is to continue with her treatments.  Continue medications and  follow.      Edward L. Luan Pulling, M.D.  Electronically Signed     ELH/MEDQ  D:  11/11/2005  T:  11/11/2005  Job:  [id number]

## 2011-01-24 NOTE — Consult Note (Signed)
NAME:  Jordan [name], Jordan [name]                 ACCOUNT NO.:  [id number]   MEDICAL RECORD NO.:  [id number]          PATIENT TYPE:  INP   LOCATION:  IC09                          FACILITY:  [facility]   PHYSICIAN:  Cristopher Estimable. Lattie Haw, MD, FACCDATE OF BIRTH:  26-Nov-1945   DATE OF CONSULTATION:  DATE OF DISCHARGE:                                   CONSULTATION   REFERRING PHYSICIAN:  Dr. Luan Pulling   CARDIOLOGIST:   HISTORY OF PRESENT ILLNESS:  This is a 65 year old woman admitted to the  hospital for exacerbation of chronic asthma referred for evaluation of  abnormal cardiac markers and tachy arrhythmias.  Jordan [name] required a  prolonged hospitalization in January for respiratory failure and pneumonia.  She was transferred to [facility] on that occasion.  An acute  myocardial infarction was diagnosed there, said to be due to Takapsubo  syndrome.   Cardiac catheterization was performed and reportedly demonstrated no  significant coronary disease.  Those records have been requested.  She has  not been treated with significant cardiac medications since.  She does take  a Statin for presumed hyperlipidemia.  She has not been a cigarette smoker.  There is no history of diabetes.   The current episode began with increasing dyspnea prompting hospital  admission.  Troponin's have been elevated in the range of 0.4-0.75.  CPK and  CK-MB have been normal.  EKG's have shown no acute changes.  She has had no  chest pain.  On Telemetry short runs of PSVT have been noted without  symptoms nor apparent hemodynamic compromise.   PAST MEDICAL HISTORY:  Notable for osteoporosis.  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  She  developed chest discomfort during that admission and was seen by a  cardiologist.  A stress nuclear study was negative.  She was admitted in  March of this year for an episode of severe C.  Difficile infection that  resolved with  appropriate therapy.  She has had a history of hypertension  that has been well treated with modest medical therapy.  She has chronically  required steroid treatment.   PAST SURGICAL HISTORY:  Additional prior surgeries have included an  oophorectomy and fundoplication on two occasions.  She suffered head trauma  a few years ago and required repair of a spinal fluid leak.  She has  undergone effort arthroscopic and open knee surgeries.  An appendectomy is  also reported.   CURRENT MEDICATIONS:  A list of current medications is not available.  In  March her discharge medications included lisinopril 20 mg daily,  atorvastatin 40 mg daily, Singulair 10 mg daily, Advair b.i.d., Spiriva  daily as well as a course of oral vancomycin.  Her prednisone dose was 10 mg  daily at that time.  She was taking furosemide as needed.   ALLERGIES:  Sulfa drugs, aspirin and barbiturates.   SOCIAL HISTORY:  Divorced; retired from work in Science writer; no excessive use of  alcohol.   FAMILY HISTORY:  Positive for osteoporosis and asthma; father  had a history  of CHF.  There is also a family history of diabetes.   REVIEW OF SYSTEMS:  Vaccinations are up-to-date.  She has required  mechanical ventilation for her chronic lung disease on approximately five  occasions.  She sometimes experiences vertigo after severe coughing spells.  She has slight hearing impairment.  She has sleep disturbance and uses  Ambien or Benadryl.  All other systems reviewed and are negative.   PHYSICAL EXAMINATION:  GENERAL:  This is a pleasant woman in mild  respiratory distress.  VITAL SIGNS:  The temperature is 96.9, blood pressure  110/55, heart rate 100 and regular, respirations 25, O2 saturation 97% on 2  liters.  HEENT:  Pupils equal, round, react to light; lids and conjunctivae  unremarkable; EOMs intact; normal oral mucosa.  NECK:  No jugular venous  distension; normal carotid upstrokes without bruits.  ENDOCRINE:  No   thyromegaly.  HEMATOPOIETIC:  No adenopathy.  SKIN:  No significant lesions.  LUNGS:  Prolonged expiratory phase with mild expiratory wheezing.  CARDIAC:  Normal first and second heart sounds; minimal systolic ejection murmur;  normal PMI.  ABDOMEN:  Soft and nontender; no bruits; no masses; no  organomegaly.  EXTREMITIES:  No edema; distal pulses intact.  NEUROMUSCULAR:  Symmetric strength and tone; normal cranial nerves.   EKG:  Sinus tachycardia; low voltage; incomplete right bundle branch block;  probable prior inferior myocardial infarction; first-degree AV block; right  axis deviation.   Telemetry:  Short runs of PSVT.   LABORATORY:  Notable for elevated troponin in the range of 0.4-0.75 with  normal CPK and CPK-MB.  Renal function is normal.  BNP level is mildly  elevated at approximately 250.  ABG shows hypoxemia with normal to low pCO2  and a normal pH.  CBC is normal except for a mildly elevated white count.   IMPRESSION:  Jordan [name] presents with exacerbation of her chronic lung  disease.  Troponin is elevated, as likely has been the case in the past.  This may be related to lung disease, rather than heart disease.  We will  obtain records from [facility] and review the criteria for diagnosis of  Takapsubo.  In any case, recurrences of this are unusual and prognosis is  good.   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  These are causing neither symptoms or  hemodynamic compromise.  No therapy will be initiated at the present time.   An echocardiogram is pending.  TSH level will also be obtained.  We  appreciate the request for consultation and will be happy to follow this  nice woman with you.      Cristopher Estimable. Lattie Haw, MD, [facility]  Electronically Signed     RMR/MEDQ  D:  07/31/2006  T:  07/31/2006  Job:  [id number]

## 2011-01-24 NOTE — Op Note (Signed)
NAME:  Jordan [name], Jordan [name]                 ACCOUNT NO.:  [id number]   MEDICAL RECORD NO.:  [id number]          PATIENT TYPE:  AMB   LOCATION:  DAY                           FACILITY:  [facility]   PHYSICIAN:  R. Garfield Cornea, M.D. DATE OF BIRTH:  12-24-1945   DATE OF PROCEDURE:  09/24/2006  DATE OF DISCHARGE:                               OPERATIVE REPORT   PROCEDURE:  Colonoscopy screening.   INDICATIONS FOR PROCEDURE:  Patient is a 65 year old lady who comes for  screening colonoscopy.  She has no lower GI tract symptoms.  It has been  10 years since her last exam.  No history of polyps, no family history  colon cancer.  Colonoscopy is now being done as screening maneuver.  This approach has been discussed with the patient at length and  potential risks, benefits, alternatives have been reviewed, questions  answered.  She is agreeable.  Please see documentation in the medical  record.   PROCEDURE NOTE:  O2 saturation, blood pressure, pulse and respirations  monitored throughout entire procedure.   CONSCIOUS SEDATION:  Versed 5 mg IV, Demerol 100 mg IV.   INSTRUMENT USED:  Pentax video chip system.   FINDINGS:  Digital rectal exam revealed no abnormalities.  Endoscopic findings:  The prep was adequate.  Colon:  Colonic mucosa was surveyed from rectosigmoid junction through  the left, transverse and right colon to the area of appendiceal orifice,  ileocecal valve and cecum.  These structures well seen and photographed  for the record.  From this level, the scope was withdrawn.  All  previously mentioned mucosal surfaces were again seen.  The patient was  noted to have scattered sigmoid diverticula.  Remainder of colonic  mucosa appeared normal.  Scope was pulled down to the rectum where  thorough examination of the rectal mucosa including retroflex view of  anal verge was undertaken.  The rectal mucosa appeared normal.  The  patient tolerated the procedure well, was reacted in  endoscopy.   IMPRESSION:  Normal rectum, left-sided diverticula.  Remainder of  colonic mucosa appeared normal.   RECOMMENDATIONS:  1. Diverticulosis literature provided to [name].  2. Repeat screening colonoscopy 10 years.      Bridgette Habermann, M.D.  Electronically Signed     RMR/MEDQ  D:  09/24/2006  T:  09/24/2006  Job:  [id number]   cc:   Percell Miller L. Luan Pulling, M.D.  Fax: [phone]   Jonnie Kind, M.D.  Fax: [phone]

## 2011-01-24 NOTE — Group Therapy Note (Signed)
NAME:  Jordan [name], Jordan [name]                 ACCOUNT NO.:  [id number]   MEDICAL RECORD NO.:  [id number]          PATIENT TYPE:  INP   LOCATION:  IC04                          FACILITY:  [facility]   PHYSICIAN:  Edward L. Luan Pulling, M.D.DATE OF BIRTH:  06-19-1946   DATE OF PROCEDURE:  09/05/2005  DATE OF DISCHARGE:                                   PROGRESS NOTE   SUBJECTIVE:  Ms. Helou is about the same.  She became very agitated when we  attempted to wean her yesterday and she was unable to be weaned because of  that.  She is sedated again and she had to be restrained to keep her from  pulling tubes out.   OBJECTIVE:  VITAL SIGNS:  O2 saturations 100%, blood pressure 153/64, pulse  is 80, respirations 12.  CHEST:  Her chest is pretty clear.  HEART:  Her heart is regular.  ABDOMEN:  Soft.   Electrolytes are normal with potassium up to 3.8, glucose 161, BUN 20,  creatinine of 1.  Blood gas with pO2 127, pCO2 of 41, pH 7.44.  She has what  looks like a new infiltrate on the right now.   ASSESSMENT:  Assessment is that she has asthma, perhaps now a patch of  pneumonia.  Part of the problem is that she is so agitated which causes  significant problems with being able to manage her extubation process.      Edward L. Luan Pulling, M.D.  Electronically Signed     ELH/MEDQ  D:  09/05/2005  T:  09/05/2005  Job:  [id number]

## 2011-01-24 NOTE — Group Therapy Note (Signed)
NAME:  Jordan [name], Jordan [name]                 ACCOUNT NO.:  [id number]   MEDICAL RECORD NO.:  [id number]          PATIENT TYPE:  INP   LOCATION:  A223                          FACILITY:  [facility]   PHYSICIAN:  Edward L. Luan Pulling, M.D.DATE OF BIRTH:  09/15/1945   DATE OF PROCEDURE:  DATE OF DISCHARGE:                                 PROGRESS NOTE   PROBLEM:  Asthma with exacerbation, supraventricular tachycardia.   SUBJECTIVE:  Jordan [name] says she is feeling better.  Dr. Lattie Haw has  seen her and his help is appreciated.  Her echocardiogram shows  borderline right ventricular hypertrophy, normal mitral valve, normal  left ventricular size with borderline hypertrophy, hyperdynamic regional  and global function.  No evidence of wall motion abnormality or frank  congestive heart failure.  She says she is feeling better.  She has less  cough, less congestion.  Her heart is regular.   Her physical exam shows that her heart is regular with a rate now of  about 108.  Looks like a sinus rhythm.  Her blood pressure in the [id number]  systolic.  Her chest is much clearer.  She still has significant  rhonchi.  Her heart is regular, as mentioned.  She does not have a  murmur.  Her abdomen is soft.  Extremities showed no edema.   Her BNP yesterday was 236, slightly elevated.  TSH 0.219, slightly low  suggesting an element of hyperthyroidism.  Blood cultures no growth at 3  days.  Sputum culture normal flora.  Her hemoglobin A1c was 6.4.   ASSESSMENT:  She is improved.   PLAN:  I am going to move her out of the intensive care unit today.  Keep her monitored.  Keep her on Cardizem, but switch her to oral  Cardizem and follow.      Edward L. Luan Pulling, M.D.  Electronically Signed     ELH/MEDQ  D:  08/01/2006  T:  08/01/2006  Job:  [id number]

## 2011-01-24 NOTE — Procedures (Signed)
NAME:  Jordan [name], Jordan [name]                 ACCOUNT NO.:  [id number]   MEDICAL RECORD NO.:  [id number]          PATIENT TYPE:  INP   LOCATION:  IC09                          FACILITY:  [facility]   PHYSICIAN:  Cristopher Estimable. Lattie Haw, MD, FACCDATE OF BIRTH:  1945/09/27   DATE OF PROCEDURE:  07/31/2006  DATE OF DISCHARGE:                                  ECHOCARDIOGRAM   REFERRING:  Sinda Du, M.D.   CLINICAL DATA:  A 65 year old woman with a history of prior myocardial  infarction, now presenting with respiratory failure.  M-mode aorta 2.6, left  atrium 3.7, septum 1.2, posterior wall 1.2, LV diastole 3.7, LV systole 2.4.   1. Technically suboptimal but adequate echocardiographic study.  2. Normal left atrial size; normal right atrial size; normal right      ventricular size and function with borderline hypertrophy.  3. Normal mitral valve.  4. Aortic valve not ideally imaged, but is grossly normal.  5. Tricuspid valve, pulmonic valve, and proximal pulmonary artery not      adequately imaged.  6. Normal left ventricular size; borderline hypertrophy; hyperdynamic      regional and global function.  7. IVC diameter is at the upper limit of normal; decreases normally with      inspiration.      Cristopher Estimable. Lattie Haw, MD, [facility]  Electronically Signed     RMR/MEDQ  D:  07/31/2006  T:  07/31/2006  Job:  [id number]

## 2011-01-24 NOTE — Group Therapy Note (Signed)
NAME:  Jordan [name], Jordan [name]                 ACCOUNT NO.:  [id number]   MEDICAL RECORD NO.:  [id number]          PATIENT TYPE:  INP   LOCATION:  IC04                          FACILITY:  [facility]   PHYSICIAN:  Edward L. Luan Pulling, M.D.DATE OF BIRTH:  [birth year]   DATE OF PROCEDURE:  09/04/2005  DATE OF DISCHARGE:                                   PROGRESS NOTE   PROBLEM:  Respiratory failure.   SUBJECTIVE:  [name] is about the same.  She is sedated.  We attempted  weaning yesterday and she tolerated it for about 2 hours, but then she got  very agitated and had to go back on the ventilator.  She is otherwise about  the same.  She is sedated.   OBJECTIVE:  GENERAL:  Her chest is much clearer than before.  VITAL SIGNS:  Her heart rate is 112, blood pressure in the 130s, O2  saturations 99%, respirations about 15.  CHEST:  Her chest as mentioned is much clear.  HEART:  Heart is regular without gallop.  ABDOMEN:  Her abdomen is soft.  EXTREMITIES:  Extremities showed no edema.   Laboratory work with blood gas on 35% O2, tidal volume of 650, rate of 12  with pH 7.46, pCO2 of 38, pO2 of 141.  Her BMET shows her potassium is 2.9.  CBC shows white count 10,200, hemoglobin is 10, platelets 184.   Chest x-ray shows no change.   ASSESSMENT:  I think she is overall somewhat better.   PLAN:  Try weaning again today.      Edward L. Luan Pulling, M.D.  Electronically Signed     ELH/MEDQ  D:  09/04/2005  T:  09/04/2005  Job:  [id number]

## 2011-01-24 NOTE — Group Therapy Note (Signed)
NAME:  Jordan [name], Jordan [name]                 ACCOUNT NO.:  [id number]   MEDICAL RECORD NO.:  [id number]          PATIENT TYPE:  INP   LOCATION:  A223                          FACILITY:  [facility]   PHYSICIAN:  Edward L. Luan Pulling, M.D.DATE OF BIRTH:  1946-06-22   DATE OF PROCEDURE:  DATE OF DISCHARGE:                                 PROGRESS NOTE   [name] is feeling better today.  She says she is not having nearly as  much congestion.  She is clearer and she feels better.  She has no new  complaints.   PHYSICAL EXAMINATION:  Her exam shows her temperature is 97.8, pulse 74,  respirations 20, blood sugar 236, blood pressure 111/51, O2 sat is 94%  on 2 liters.  CHEST:  Her chest is clearer, but not clear.  HEART:  Her heart is regular.  ABDOMEN:  Her abdomen is soft.   ASSESSMENT:  Is that she is better.   PLAN:  To continue with her treatments as we are for now, and then try  to see if we can get her perhaps ready for discharge by tomorrow or even  this evening.      Edward L. Luan Pulling, M.D.  Electronically Signed     ELH/MEDQ  D:  08/04/2006  T:  08/05/2006  Job:  [id number]

## 2011-02-07 ENCOUNTER — Ambulatory Visit (HOSPITAL_BASED_OUTPATIENT_CLINIC_OR_DEPARTMENT_OTHER)
Admission: RE | Admit: 2011-02-07 | Discharge: 2011-02-07 | Disposition: A | Discharge status: Home / Self Care | Payer: Medicare Other | Source: Ambulatory Visit | Attending: Orthopedic Surgery | Admitting: Orthopedic Surgery

## 2011-02-07 DIAGNOSIS — T8489XA Other specified complication of internal orthopedic prosthetic devices, implants and grafts, initial encounter: Secondary | ICD-10-CM | POA: Insufficient documentation

## 2011-02-07 DIAGNOSIS — M25539 Pain in unspecified wrist: Secondary | ICD-10-CM | POA: Insufficient documentation

## 2011-02-07 DIAGNOSIS — Y831 Surgical operation with implant of artificial internal device as the cause of abnormal reaction of the patient, or of later complication, without mention of misadventure at the time of the procedure: Secondary | ICD-10-CM | POA: Insufficient documentation

## 2011-02-13 NOTE — Op Note (Signed)
NAME:  Jordan [name], Jordan [name]                 ACCOUNT NO.:  [id number]  MEDICAL RECORD NO.:  [id number]  LOCATION:                                 FACILITY:  PHYSICIAN:  Youlanda Mighty. Kynzleigh Bandel, M.D. DATE OF BIRTH:  1945/11/17  DATE OF PROCEDURE:  02/07/2011 DATE OF DISCHARGE:                              OPERATIVE REPORT   PREOPERATIVE DIAGNOSIS:  Problematic buried subfascial pin, left wrist, status post ulnar 4-bone fusion 6 weeks prior.  POSTOPERATIVE DIAGNOSIS:  Problematic buried subfascial pin, left wrist, status post ulnar 4-bone fusion 6 weeks prior.  OPERATION:  Removal of subfascial pin from carpus, dorsal aspect of left wrist.  OPERATING SURGEON:  Youlanda Mighty. Telford Archambeau, MD  ASSISTANT:  Marily Lente Dasnoit, PA  ANESTHESIA:  A 2% lidocaine field block, total volume 3 mL.  This was performed as a minor operating room procedure.  INDICATIONS:  Jordan [name] is a 65 year old woman referred for evaluation and management of a very painful left wrist.  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  Six weeks prior, we performed a scaphoid excision and ulnar 4-bone fusion.  She had autogenous bone grafting and placement of 3 Kirschner wires.  Ms. Shortt has had a litany of difficulties with the pins.  Unfortunately due to the nature of the pin caps currently stocked by our hospital system, her pin caps were lost in the dressing in the early postoperative period.  She subsequently had some skin inflammation and dorsal distal pin became subcutaneous while in one of her postoperative cast.  This has caused irritation.  She has had some mild pin reactions around the other pins which were percutaneous.  She has been on courses of doxycycline.  We have been vigilant, looking for signs of cellulitis and/or deep infection.  Today, we have had no signs of deep infection.  We have removed the percutaneous pins in the office and due to the fact that the third pin was subcutaneous in the  region of her tendons on the dorsum of the hand, we recommended removal under sterile operating room conditions at this time to minimize the risk of any possible wound complications such as infection.  After informed consent, she was brought to operating room at this time for removal of her buried subfascial carpal pin.  DESCRIPTION OF PROCEDURE:  Briley Arballo was brought to room #1 of [place] and placed in the supine position on the operating table.  Following informed consent and Betadine prep, 3 mL of 2% lidocaine was infiltrated into the path of the intended incision and around the pin site.  After 5 minutes, excellent anesthesia was achieved.  The left hand and arm were then prepped with Betadine soap solution, sterilely draped.  A pneumatic tourniquet was applied to proximal forearm.  The tourniquet was set at 250 mmHg.  After routine surgical time-out, the arm was exsanguinated by direct compression and the arterial tourniquet inflated on the proximal forearm.  After checking for sensibility, we made a short longitudinal incision. Tenotomy scissors were used to dissect the dorsal vein away from the pin.  The fascia was released and the pin circumferentially was dissected.  The extensor tendons were retracted.  The pen identified.  A large caliber needle driver was used to withdraw the pin.  Ms. Rickenbach had the expected suction effect when the pin was removed.  The wound was then irrigated.  We did not find any material worth of culture.  The prior pin tracts were examined.  No serous or purulent material was recovered.  Therefore, no cultures were obtained.  The wound was then repaired with interrupted suture of 6-0 chromic.  The wound was dressed with the Steri-Strips, sterile gauze, Webril, and a volar plaster splint.  For aftercare, we will encourage Ms. Ida to elevate her hand above her heart for the next 48 hours.  We will see her back in followup in  the office in 1 week, at which time we will re-x-ray her wrist.  We will continue immobilization and/or casting to assure her intercarpal fusion is healed.  Given her problems with the pins, she is at greater risk for nonunion than the average case where we do not have pin complications.  Questions were invited and answered in detail.  She was provided Dilaudid 2 mg 1 p.o. q.4-6 hours p.r.n. pain, 30 tablets without refill as a postoperative analgesic.     Youlanda Mighty Nayanna Seaborn, M.D.     RVS/MEDQ  D:  02/07/2011  T:  02/07/2011  Job:  [id number]  Electronically Signed by Theodis Sato M.D. on 02/13/2011 09:40:05 AM

## 2011-06-09 HISTORY — PX: COLONOSCOPY: SHX174

## 2011-06-19 ENCOUNTER — Ambulatory Visit (INDEPENDENT_AMBULATORY_CARE_PROVIDER_SITE_OTHER): Payer: Medicare Other

## 2011-06-19 DIAGNOSIS — R197 Diarrhea, unspecified: Secondary | ICD-10-CM

## 2011-06-20 LAB — COMPREHENSIVE METABOLIC PANEL WITH GFR
ALT: 26
AST: 25
Albumin: 3.8
Alkaline Phosphatase: 56
BUN: 19
CO2: 23
Calcium: 9.3
Chloride: 111
Creatinine, Ser: 0.95
GFR calc Af Amer: >60
GFR calc non Af Amer: 60 — ABNORMAL LOW
Glucose, Bld: 104 — ABNORMAL HIGH
Potassium: 3.8
Sodium: 140
Total Bilirubin: 0.6
Total Protein: 6.5

## 2011-06-20 LAB — CBC
HCT: 34.9 — ABNORMAL LOW
Hemoglobin: 11.8 — ABNORMAL LOW
MCHC: 33.7
MCV: 91.8
Platelets: 185
RBC: 3.8 — ABNORMAL LOW
RDW: 13.3
WBC: 7.8

## 2011-06-20 LAB — URINALYSIS, ROUTINE W REFLEX MICROSCOPIC
Bilirubin Urine: NEGATIVE
Glucose, UA: NEGATIVE
Hgb urine dipstick: NEGATIVE
Ketones, ur: NEGATIVE
Nitrite: NEGATIVE
Protein, ur: NEGATIVE
Specific Gravity, Urine: >1.03 — ABNORMAL HIGH
Urobilinogen, UA: 0.2
pH: 6

## 2011-06-20 LAB — DIFFERENTIAL
Basophils Absolute: 0
Basophils Relative: 1
Eosinophils Absolute: 0.1
Eosinophils Relative: 1
Lymphocytes Relative: 8 — ABNORMAL LOW
Lymphs Abs: 0.6 — ABNORMAL LOW
Monocytes Absolute: 0.6
Monocytes Relative: 8
Neutro Abs: 6.5
Neutrophils Relative %: 83 — ABNORMAL HIGH

## 2011-06-20 LAB — URINE CULTURE
Colony Count: 25000
Special Requests: NEGATIVE

## 2011-06-20 LAB — AMYLASE: Amylase: 138 — ABNORMAL HIGH

## 2011-06-20 LAB — LIPASE, BLOOD: Lipase: 33

## 2011-07-01 ENCOUNTER — Other Ambulatory Visit: Payer: Self-pay | Admitting: Internal Medicine

## 2011-07-07 ENCOUNTER — Encounter: Payer: Self-pay | Admitting: Internal Medicine

## 2011-07-07 ENCOUNTER — Ambulatory Visit (AMBULATORY_SURGERY_CENTER): Payer: Medicare Other | Admitting: Internal Medicine

## 2011-07-07 ENCOUNTER — Other Ambulatory Visit: Payer: Self-pay | Admitting: Internal Medicine

## 2011-07-07 VITALS — BP 156/94 | HR 73 | Temp 98.2°F | Resp 20 | Ht 63.0 in | Wt 150.0 lb

## 2011-07-07 DIAGNOSIS — Z006 Encounter for examination for normal comparison and control in clinical research program: Secondary | ICD-10-CM

## 2011-07-07 DIAGNOSIS — D126 Benign neoplasm of colon, unspecified: Secondary | ICD-10-CM

## 2011-07-07 DIAGNOSIS — Z1211 Encounter for screening for malignant neoplasm of colon: Secondary | ICD-10-CM

## 2011-07-07 DIAGNOSIS — K5730 Diverticulosis of large intestine without perforation or abscess without bleeding: Secondary | ICD-10-CM

## 2011-07-07 MED ORDER — SODIUM CHLORIDE 0.9 % IV SOLN: 500.0000 mL | INTRAVENOUS | Status: DC

## 2011-07-07 NOTE — Patient Instructions (Signed)
Please review discharge instructions (blue and green sheets)  Await pathology  Resume regular medications  Keep follow up with research coordinator

## 2011-07-07 NOTE — Progress Notes (Signed)
Pressure applied to abdomen to reach cecum.  

## 2011-07-08 ENCOUNTER — Telehealth: Payer: Self-pay | Admitting: *Deleted

## 2011-07-08 NOTE — Telephone Encounter (Signed)

## 2011-07-10 ENCOUNTER — Ambulatory Visit: Payer: Medicare Other

## 2011-07-17 ENCOUNTER — Other Ambulatory Visit: Payer: Self-pay | Admitting: Adult Health

## 2011-07-17 DIAGNOSIS — Z139 Encounter for screening, unspecified: Secondary | ICD-10-CM

## 2011-07-29 ENCOUNTER — Ambulatory Visit (HOSPITAL_COMMUNITY)
Admission: RE | Admit: 2011-07-29 | Discharge: 2011-07-29 | Disposition: A | Discharge status: Home / Self Care | Payer: Medicare Other | Source: Ambulatory Visit | Attending: Adult Health | Admitting: Adult Health

## 2011-07-29 DIAGNOSIS — Z1231 Encounter for screening mammogram for malignant neoplasm of breast: Secondary | ICD-10-CM | POA: Insufficient documentation

## 2011-07-29 DIAGNOSIS — Z139 Encounter for screening, unspecified: Secondary | ICD-10-CM

## 2011-08-07 ENCOUNTER — Ambulatory Visit (INDEPENDENT_AMBULATORY_CARE_PROVIDER_SITE_OTHER): Payer: Medicare Other

## 2011-08-07 DIAGNOSIS — R197 Diarrhea, unspecified: Secondary | ICD-10-CM

## 2011-08-14 ENCOUNTER — Other Ambulatory Visit (HOSPITAL_COMMUNITY)
Admission: RE | Admit: 2011-08-14 | Discharge: 2011-08-14 | Disposition: A | Discharge status: Home / Self Care | Payer: Medicare Other | Source: Ambulatory Visit | Attending: Obstetrics and Gynecology | Admitting: Obstetrics and Gynecology

## 2011-08-14 ENCOUNTER — Other Ambulatory Visit: Payer: Self-pay | Admitting: Adult Health

## 2011-08-14 DIAGNOSIS — Z01419 Encounter for gynecological examination (general) (routine) without abnormal findings: Secondary | ICD-10-CM | POA: Insufficient documentation

## 2011-08-14 DIAGNOSIS — Z79899 Other long term (current) drug therapy: Secondary | ICD-10-CM

## 2011-08-18 ENCOUNTER — Ambulatory Visit (HOSPITAL_COMMUNITY)
Admission: RE | Admit: 2011-08-18 | Discharge: 2011-08-18 | Disposition: A | Discharge status: Home / Self Care | Payer: Medicare Other | Source: Ambulatory Visit | Attending: Adult Health | Admitting: Adult Health

## 2011-08-18 DIAGNOSIS — Z780 Asymptomatic menopausal state: Secondary | ICD-10-CM | POA: Insufficient documentation

## 2011-08-18 DIAGNOSIS — Z79899 Other long term (current) drug therapy: Secondary | ICD-10-CM

## 2011-08-18 DIAGNOSIS — M818 Other osteoporosis without current pathological fracture: Secondary | ICD-10-CM | POA: Insufficient documentation

## 2011-09-04 ENCOUNTER — Ambulatory Visit: Payer: Self-pay

## 2011-10-02 ENCOUNTER — Ambulatory Visit (INDEPENDENT_AMBULATORY_CARE_PROVIDER_SITE_OTHER): Payer: Self-pay

## 2011-10-02 DIAGNOSIS — R197 Diarrhea, unspecified: Secondary | ICD-10-CM

## 2012-02-06 ENCOUNTER — Ambulatory Visit (HOSPITAL_COMMUNITY)
Admission: RE | Admit: 2012-02-06 | Discharge: 2012-02-06 | Disposition: A | Discharge status: Home / Self Care | Payer: Medicare Other | Source: Ambulatory Visit | Attending: Pulmonary Disease | Admitting: Pulmonary Disease

## 2012-02-06 ENCOUNTER — Other Ambulatory Visit (HOSPITAL_COMMUNITY): Payer: Self-pay | Admitting: Pulmonary Disease

## 2012-02-06 DIAGNOSIS — A0472 Enterocolitis due to Clostridium difficile, not specified as recurrent: Secondary | ICD-10-CM

## 2012-02-06 DIAGNOSIS — R109 Unspecified abdominal pain: Secondary | ICD-10-CM

## 2012-02-06 DIAGNOSIS — K5792 Diverticulitis of intestine, part unspecified, without perforation or abscess without bleeding: Secondary | ICD-10-CM

## 2012-02-06 DIAGNOSIS — K5732 Diverticulitis of large intestine without perforation or abscess without bleeding: Secondary | ICD-10-CM | POA: Insufficient documentation

## 2012-02-06 MED ORDER — IOHEXOL 300 MG/ML  SOLN
100.0000 mL | Freq: Once | INTRAMUSCULAR | Status: AC | PRN
  Administered 2012-02-06: 100 mL via INTRAVENOUS

## 2012-08-03 ENCOUNTER — Other Ambulatory Visit: Payer: Self-pay | Admitting: Obstetrics and Gynecology

## 2012-08-03 DIAGNOSIS — Z139 Encounter for screening, unspecified: Secondary | ICD-10-CM

## 2012-08-09 ENCOUNTER — Ambulatory Visit (HOSPITAL_COMMUNITY)
Admission: RE | Admit: 2012-08-09 | Discharge: 2012-08-09 | Disposition: A | Discharge status: Home / Self Care | Payer: Medicare Other | Source: Ambulatory Visit | Attending: Obstetrics and Gynecology | Admitting: Obstetrics and Gynecology

## 2012-08-09 DIAGNOSIS — Z139 Encounter for screening, unspecified: Secondary | ICD-10-CM

## 2012-08-09 DIAGNOSIS — Z1231 Encounter for screening mammogram for malignant neoplasm of breast: Secondary | ICD-10-CM | POA: Insufficient documentation

## 2012-09-16 ENCOUNTER — Other Ambulatory Visit: Payer: Self-pay | Admitting: Adult Health

## 2012-09-16 ENCOUNTER — Other Ambulatory Visit (HOSPITAL_COMMUNITY)
Admission: RE | Admit: 2012-09-16 | Discharge: 2012-09-16 | Disposition: A | Discharge status: Home / Self Care | Payer: Medicare Other | Source: Ambulatory Visit | Attending: Obstetrics and Gynecology | Admitting: Obstetrics and Gynecology

## 2012-09-16 DIAGNOSIS — Z1151 Encounter for screening for human papillomavirus (HPV): Secondary | ICD-10-CM | POA: Insufficient documentation

## 2012-09-16 DIAGNOSIS — M810 Age-related osteoporosis without current pathological fracture: Secondary | ICD-10-CM

## 2012-09-16 DIAGNOSIS — Z01419 Encounter for gynecological examination (general) (routine) without abnormal findings: Secondary | ICD-10-CM | POA: Insufficient documentation

## 2012-09-22 ENCOUNTER — Ambulatory Visit (HOSPITAL_COMMUNITY)
Admission: RE | Admit: 2012-09-22 | Discharge: 2012-09-22 | Disposition: A | Discharge status: Home / Self Care | Payer: Medicare Other | Source: Ambulatory Visit | Attending: Adult Health | Admitting: Adult Health

## 2012-09-22 DIAGNOSIS — M810 Age-related osteoporosis without current pathological fracture: Secondary | ICD-10-CM | POA: Insufficient documentation

## 2013-03-21 ENCOUNTER — Encounter: Payer: Self-pay | Admitting: Internal Medicine

## 2013-03-23 ENCOUNTER — Ambulatory Visit (INDEPENDENT_AMBULATORY_CARE_PROVIDER_SITE_OTHER): Payer: Medicare Other | Admitting: Gastroenterology

## 2013-03-23 ENCOUNTER — Encounter: Payer: Self-pay | Admitting: Gastroenterology

## 2013-03-23 VITALS — BP 141/82 | HR 95 | Temp 98.2°F | Ht 63.0 in | Wt 133.8 lb

## 2013-03-23 DIAGNOSIS — R195 Other fecal abnormalities: Secondary | ICD-10-CM

## 2013-03-23 MED ORDER — HYDROCORTISONE 2.5 % RE CREA: TOPICAL_CREAM | Freq: Two times a day (BID) | RECTAL | Status: DC

## 2013-03-23 MED ORDER — DICYCLOMINE HCL 10 MG PO CAPS: 10.0000 mg | ORAL_CAPSULE | Freq: Three times a day (TID) | ORAL | Status: DC

## 2013-03-23 NOTE — Patient Instructions (Addendum)
I have sent in a cream for your hemorrhoids. Use this twice a day for 7 days, then take a break. You can resume again if it continues to be bothersome for another 7 days.   Start taking Bentyl 1 capsule before meals and 1 at bedtime. This is for abdominal cramping and loose stool after eating.  Please submit the stool samples to the lab as soon as you can. We may need to place you on antibiotics.   Start taking a probiotic daily. Some examples are Digestive Advantage, Philip's Colon Health, walgreen's brand, Align, and Restora.   Review the care of the hemorrhoids attached.   If you have any worsening of your symptoms, please seek care immediately.   We will see you back in 6 weeks.   Hemorrhoids Hemorrhoids are puffy (swollen) veins around the rectum or anus. Hemorrhoids can cause pain, itching, bleeding, or irritation. HOME CARE  Eat foods with fiber, such as whole grains, beans, nuts, fruits, and vegetables. Ask your doctor about taking products with added fiber in them (fibersupplements).  Drink enough fluid to keep your pee (urine) clear or pale yellow.  Exercise often.  Go to the bathroom when you have the urge to poop. Do not wait.  Avoid straining to poop (bowel movement).  Keep the butt area dry and clean. Use wet toilet paper or moist paper towels.  Medicated creams and medicine inserted into the anus (anal suppository) may be used or applied as told.  Only take medicine as told by your doctor.  Take a warm water bath (sitz bath) for 15 20 minutes to ease pain. Do this 3 4 times a day.  Place ice packs on the area if it is tender or puffy. Use the ice packs between the warm water baths.  Put ice in a plastic bag.  Place a towel between your skin and the bag.  Leave the ice on for 15-20 minutes, 3-4 times a day.  Do not use a donut-shaped pillow or sit on the toilet for a long time. GET HELP RIGHT AWAY IF:   You have more pain that is not controlled by  treatment or medicine.  You have bleeding that will not stop.  You have trouble or are unable to poop (bowel movement).  You have pain or puffiness outside the area of the hemorrhoids. MAKE SURE YOU:   Understand these instructions.  Will watch your condition.  Will get help right away if you are not doing well or get worse. Document Released: 06/03/2008 Document Revised: 08/11/2012 Document Reviewed: 07/06/2012 [facility] Patient Information 2014 [place], [place].

## 2013-03-23 NOTE — Progress Notes (Signed)
Primary Care Physician:  Alonza Bogus, MD Primary Gastroenterologist:  Dr. Gala Romney  Chief Complaint  Patient presents with  . Diarrhea  . Hemorrhoids  . Abdominal Pain    HPI:   Pleasant 67 year old female returns today as a self-referral secondary to hemorrhoids and postprandial loose stools. Upon review of medical records, she has a history of Cdiff colitis around 2007. Her last colonoscopy was by Dr. Henrene Pastor in Oct 2012 with tubular adenomas, internal hemorrhoids, diverticulosis. She will be due for surveillance in 2017.   Notes lower abdominal cramping, followed by loose stools after she eats anything X 2 weeks. Normal baseline is a couple times per day. Notes 2-3 when first waking up. 6-7 total per day. +rectal bleeding intermittently. Hemorrhoid discomfort. Using preparation H cream currently without much help. Weight loss of about 15 lbs in the last month. Good appetite but eating less due to loose stools. Rare chills, no fever. No vomiting. Denies recent abx. No changes to medications. [place]. Denies sick contacts. States 2 years ago had loose stools but not as bad.   Past Medical History  Diagnosis Date  . Allergy     seasonal  . Asthma   . Blood transfusion   . Cataract     small don't know which eye.  Marland Kitchen COPD (chronic obstructive pulmonary disease)   . Depression   . Hyperlipidemia   . Hypertension   . Myocardial infarction   . Osteoporosis   . IBS (irritable bowel syndrome)   . C. difficile colitis     2007: treated with Flagyl and Vanc     Past Surgical History  Procedure Laterality Date  . Appendectomy    . Gastric fundoplication    . Oophorectomy    . Neck surgery    . Cholecystectomy    . Knee arthroscopy  x2  . Colonoscopy   09/24/2006    RMR: Normal rectum, left-sided diverticula  . Esophagogastroduodenoscopy  04/07/2006    [id number] esophagus s/p placement of Bravo pH probe  . Colonoscopy  Oct 2012    Dr. Henrene Pastor: tubular adenomas, moderative  diverticulosis, internal hemorrhoids    Current Outpatient Prescriptions  Medication Sig Dispense Refill  . albuterol (PROVENTIL HFA;VENTOLIN HFA) 108 (90 BASE) MCG/ACT inhaler Inhale 2 puffs into the lungs every 6 (six) hours as needed for wheezing.      Marland Kitchen FLUoxetine (PROZAC) 40 MG capsule Take 40 mg by mouth daily.        . Fluticasone-Salmeterol (ADVAIR) 500-50 MCG/DOSE AEPB Inhale 1 puff into the lungs 2 (two) times daily.        . montelukast (SINGULAIR) 10 MG tablet Take 10 mg by mouth at bedtime.        . predniSONE (DELTASONE) 10 MG tablet Take 10 mg by mouth daily.        Marland Kitchen tiotropium (SPIRIVA) 18 MCG inhalation capsule Place 18 mcg into inhaler and inhale daily.        . verapamil (CALAN-SR) 120 MG CR tablet Take 120 mg by mouth at bedtime.        . dicyclomine (BENTYL) 10 MG capsule Take 1 capsule (10 mg total) by mouth 4 (four) times daily -  before meals and at bedtime.  120 capsule  3  . hydrocortisone (PROCTOZONE-HC) 2.5 % rectal cream Place rectally 2 (two) times daily.  30 g  0   No current facility-administered medications for this visit.    Allergies as of 03/23/2013 - Review Complete 03/23/2013  Allergen Reaction Noted  . Barbiturates  05/04/2009  . Diltiazem hcl  05/04/2009  . Sulfa antibiotics  07/07/2011    Family History  Problem Relation Age of Onset  . Heart disease Father   . Colon cancer Neg Hx     History   Social History  . Marital Status: Divorced    Spouse Name: N/A    Number of Children: N/A  . Years of Education: N/A   Occupational History  . Not on file.   Social History Main Topics  . Smoking status: Never Smoker   . Smokeless tobacco: Never Used  . Alcohol Use: 3.0 oz/week    6 drink(s) per week     Comment: about 1 beer a day, maybe a few beers every other day  . Drug Use: No  . Sexually Active: Not on file   Other Topics Concern  . Not on file   Social History Narrative  . No narrative on file    Review of Systems: Gen:  Denies any fever, chills, fatigue, weight loss, lack of appetite.  CV: Denies chest pain, heart palpitations, peripheral edema, syncope.  Resp: Denies shortness of breath at rest or with exertion. Denies wheezing or cough.  GI: see HPI GU : Denies urinary burning, urinary frequency, urinary hesitancy MS: +hip pain Derm: Denies rash, itching, dry skin Psych: Denies depression, anxiety, memory loss, and confusion Heme: Denies bruising, bleeding, and enlarged lymph nodes.  Physical Exam: BP 141/82  Pulse 95  Temp(Src) 98.2 F (36.8 C) (Oral)  Ht 5\' 3"  (1.6 m)  Wt 133 lb 12.8 oz (60.691 kg)  BMI 23.71 kg/m2 General:   Alert and oriented. Pleasant and cooperative. Well-nourished and well-developed.  Head:  Normocephalic and atraumatic. Eyes:  Without icterus, sclera clear and conjunctiva pink.  Ears:  Normal auditory acuity. Nose:  No deformity, discharge,  or lesions. Mouth:  No deformity or lesions, oral mucosa pink.  Neck:  Supple, without mass or thyromegaly. Lungs:  Clear to auscultation bilaterally. No wheezes, rales, or rhonchi. No distress.  Heart:  S1, S2 present without murmurs appreciated.  Abdomen:  +BS, soft, mild TTP lower abdomen and non-distended. No HSM noted. No guarding or rebound. No masses appreciated.  Rectal: small prolapsed internal hemorrhoid without thrombosis. Soft. Tender to palpation Msk:  Symmetrical without gross deformities. Normal posture. Extremities:  Without clubbing or edema. Neurologic:  Alert and  oriented x4;  grossly normal neurologically. Skin:  Intact without significant lesions or rashes. Cervical Nodes:  No significant cervical adenopathy. Psych:  Alert and cooperative. Normal mood and affect.

## 2013-03-23 NOTE — Assessment & Plan Note (Addendum)
67 year old female presenting with 2 week history of postprandial loose stools and abdominal cramping. Associated self-reported weight loss of around 15 lbs. Low-volume hematochezia noted, with known internal hemorrhoids and physical exam revealing non-thrombosed hemorrhoids. History significant for Cdiff colitis in 2007, treated with Flagyl and Vanc. Although she has had no exposure to antibiotics, I am concerned for recurrent Cdiff. Her last colonoscopy is fairly up-to-date, as of Oct 2012 by Dr. Gwenlyn Found as noted above. Barring any unforeseen events, her next evaluation is due 2017.   I have added Proctozone cream BID Probiotic daily Bentyl with meals and at bedtime for supportive measures Cdiff, stool culture, and Giardia If positive Cdiff, consider Vancomycin as initial therapy due to recurrence.  6 week f/u.

## 2013-03-23 NOTE — Progress Notes (Signed)
CC PCP 

## 2013-03-28 ENCOUNTER — Other Ambulatory Visit: Payer: Self-pay | Admitting: Gastroenterology

## 2013-03-28 ENCOUNTER — Telehealth: Payer: Self-pay

## 2013-03-28 LAB — GIARDIA ANTIGEN: Giardia Screen (EIA): NEGATIVE

## 2013-03-28 LAB — CLOSTRIDIUM DIFFICILE BY PCR: Toxigenic C. Difficile by PCR: DETECTED — CR

## 2013-03-28 MED ORDER — VANCOMYCIN HCL 125 MG PO CAPS: 125.0000 mg | ORAL_CAPSULE | Freq: Four times a day (QID) | ORAL | Status: DC

## 2013-03-28 NOTE — Telephone Encounter (Signed)
Pt wanted to know if she should take the bentyl while taking the vancomycin. I told her to hold the Bentyl while on the Vancomycin.

## 2013-03-28 NOTE — Progress Notes (Signed)
Quick Note:  Cdiff positive.  Due to her prior history, I am sending in Vancomycin. Complete for 14 days.  Make sure she is taking a probiotic. Please review cleaning precautions ______

## 2013-03-28 NOTE — Progress Notes (Signed)
Quick Note:  Called and informed pt. She is taking probiotic daily. Reviewed the precautions. ______

## 2013-03-29 LAB — STOOL CULTURE

## 2013-03-29 NOTE — Telephone Encounter (Signed)
Pt is aware that it is ok to take the Bentyl

## 2013-03-29 NOTE — Telephone Encounter (Signed)
Yes that is ok. Use as needed, no more than 3 times a day for now.

## 2013-05-04 ENCOUNTER — Encounter: Payer: Self-pay | Admitting: Gastroenterology

## 2013-05-04 ENCOUNTER — Ambulatory Visit (INDEPENDENT_AMBULATORY_CARE_PROVIDER_SITE_OTHER): Payer: Medicare Other | Admitting: Gastroenterology

## 2013-05-04 VITALS — BP 134/83 | HR 89 | Temp 98.0°F | Ht 63.0 in | Wt 135.6 lb

## 2013-05-04 DIAGNOSIS — R195 Other fecal abnormalities: Secondary | ICD-10-CM

## 2013-05-04 MED ORDER — HYDROCORTISONE 2.5 % RE CREA: TOPICAL_CREAM | Freq: Two times a day (BID) | RECTAL | Status: DC

## 2013-05-04 MED ORDER — HYOSCYAMINE SULFATE 0.125 MG SL SUBL: 0.1250 mg | SUBLINGUAL_TABLET | SUBLINGUAL | Status: DC | PRN

## 2013-05-04 NOTE — Progress Notes (Signed)
Referring Provider: Alonza Bogus, MD Primary Care Physician:  Alonza Bogus, MD Primary GI: Dr. Gala Romney   Chief Complaint  Patient presents with  . Follow-up    HPI:   67 year old female returns today in follow-up after recent finding of positive Cdiff PCR in July 2014. Due to her prior incidence of Cdiff in the remote past, she was treated with Vanc X 14 days. She is due for surveillance colonoscopy in 2017. She is actually up 2 lbs from July, which is encouraging, as she had lost some weight at time of presentation previously.    States stool consistency has improved, but she still has significant postprandial loose stool. States Bentyl did not help much, only used for 4-5 days. Stool is mushy. Abdomen uncomfortable majority of time. No worsening with bowel habits. Within 20 minutes of every meal, lower abdomen starts hurting worse and has to go to bathroom. States rectal bleeding is worse. Appetite is ok but doesn't want to eat due to pain.   Occasional nausea but not extreme.   Past Medical History  Diagnosis Date  . Allergy     seasonal  . Asthma   . Blood transfusion   . Cataract     small don't know which eye.  Marland Kitchen COPD (chronic obstructive pulmonary disease)   . Depression   . Hyperlipidemia   . Hypertension   . Myocardial infarction   . Osteoporosis   . IBS (irritable bowel syndrome)   . C. difficile colitis     2007: treated with Flagyl and Vanc, 2014: treated with vanc    Past Surgical History  Procedure Laterality Date  . Appendectomy    . Gastric fundoplication    . Oophorectomy    . Neck surgery    . Cholecystectomy    . Knee arthroscopy  x2  . Colonoscopy   09/24/2006    RMR: Normal rectum, left-sided diverticula  . Esophagogastroduodenoscopy  04/07/2006    [id number] esophagus s/p placement of Bravo pH probe  . Colonoscopy  Oct 2012    Dr. Henrene Pastor: tubular adenomas, moderative diverticulosis, internal hemorrhoids    Current Outpatient Prescriptions   Medication Sig Dispense Refill  . albuterol (PROVENTIL HFA;VENTOLIN HFA) 108 (90 BASE) MCG/ACT inhaler Inhale 2 puffs into the lungs every 6 (six) hours as needed for wheezing.      . dicyclomine (BENTYL) 10 MG capsule Take 1 capsule (10 mg total) by mouth 4 (four) times daily -  before meals and at bedtime.  120 capsule  3  . FLUoxetine (PROZAC) 40 MG capsule Take 40 mg by mouth daily.        . Fluticasone-Salmeterol (ADVAIR) 500-50 MCG/DOSE AEPB Inhale 1 puff into the lungs 2 (two) times daily.        . hydrocortisone (PROCTOZONE-HC) 2.5 % rectal cream Place rectally 2 (two) times daily.  30 g  0  . montelukast (SINGULAIR) 10 MG tablet Take 10 mg by mouth at bedtime.        . predniSONE (DELTASONE) 10 MG tablet Take 10 mg by mouth daily.        Marland Kitchen tiotropium (SPIRIVA) 18 MCG inhalation capsule Place 18 mcg into inhaler and inhale daily.        . verapamil (CALAN-SR) 120 MG CR tablet Take 120 mg by mouth at bedtime.         No current facility-administered medications for this visit.    Allergies as of 05/04/2013 - Review Complete 05/04/2013  Allergen Reaction Noted  .  Barbiturates  05/04/2009  . Diltiazem hcl  05/04/2009  . Sulfa antibiotics  07/07/2011    Family History  Problem Relation Age of Onset  . Heart disease Father   . Colon cancer Neg Hx     History   Social History  . Marital Status: Divorced    Spouse Name: N/A    Number of Children: N/A  . Years of Education: N/A   Social History Main Topics  . Smoking status: Never Smoker   . Smokeless tobacco: Never Used  . Alcohol Use: 3.0 oz/week    6 drink(s) per week     Comment: about 1 beer a day, maybe a few beers every other day  . Drug Use: No  . Sexual Activity: None   Other Topics Concern  . None   Social History Narrative  . None    Review of Systems: Negative unless mentioned above  Physical Exam: BP 134/83  Pulse 89  Temp(Src) 98 F (36.7 C) (Oral)  Ht 5\' 3"  (1.6 m)  Wt 135 lb 9.6 oz (61.508  kg)  BMI 24.03 kg/m2 General:   Alert and oriented. No distress noted. Pleasant and cooperative.  Head:  Normocephalic and atraumatic. Eyes:  Conjuctiva clear without scleral icterus. Heart:  S1, S2 present without murmurs, rubs, or gallops. Regular rate and rhythm. Abdomen:  +BS, soft, non-tender and non-distended. No rebound or guarding. No HSM or masses noted. Msk:  Symmetrical without gross deformities. Normal posture. Extremities:  Without edema. Neurologic:  Alert and  oriented x4;  grossly normal neurologically. Skin:  Intact without significant lesions or rashes. Psych:  Alert and cooperative. Normal mood and affect.

## 2013-05-04 NOTE — Patient Instructions (Addendum)
I have sent a prescription called Levsin to the pharmacy. If this is not covered, let us know. This is for cramping and loose stools. Take every 4 hours as needed.   Please complete the blood work and stool sample as soon as possible.  I have sent a prescription for cream for your hemorrhoids.  I have also ordered a CT scan to further evaluate your symptoms.   Follow a lactose-free diet for now. Please see attached. Avoid all medications such as Ibuprofen, Advil, Aleve, Motrin.   Further recommendations to follow shortly, and please call with any worsening of symptoms. Lactose-Free Diet Lactose is a carbohydrate that is found mainly in milk and milk products, as well as in foods with added milk or whey. Lactose must be digested by the enzyme lactase in order to be used by the body. Lactose intolerance occurs when there is a shortage of lactase. When your body is not able to digest lactose, you may feel sick to your stomach (nausea), bloated, and have cramps, gas, and diarrhea. TYPES OF LACTASE DEFICIENCY  Primary lactase deficiency. This is the most common type. It is characterized by a slow decrease in lactase activity.  Secondary lactase deficiency. This occurs following injury to the small intestinal mucosa as a result of a disease or condition. It can also occur as a result of surgery or after treatment with antibiotic medicines or cancer drugs. Tolerance to lactose varies widely. Each person must determine how much milk can be consumed without developing symptoms. Drinking smaller portions of milk throughout the day may be helpful. Some studies suggest that slowing gastric emptying may help increase tolerance of milk products. This may be done by:  Consuming milk or milk products with a meal rather than alone.  Consuming milk with a higher fat content. There are many dairy products that may be tolerated better than milk by some people, including:  Cheese (especially aged cheese). The  lactose content is much lower than in milk.  Cultured dairy products, such as yogurt, buttermilk, cottage cheese, and sweet acidophilus milk (kefir). These products are usually well tolerated by lactase-deficient people. This is because the healthy bacteria help digest lactose.  Lactose-hydrolyzed milk. This product contains 40% to 90% less lactose than milk and may also be well tolerated. ADEQUACY These diets may be deficient in calcium, riboflavin, and vitamin D, according to the Recommended Dietary Allowances of the [employer]. Depending on individual tolerances and the use of milk substitutes, milk, or other dairy products, you may be able to meet these recommendations. SPECIAL NOTES  Lactose is a carbohydrate. The main food source for lactose is dairy products. Reading food labels is important. Many products contain lactose even when they are not made from milk. Look for the following words: whey, milk solids, dry milk solids, nonfat dry milk powder. Typical sources of lactose other than dairy products include breads, candies, cold cuts, prepared and processed foods, and commercial sauces and gravies.  All foods must be prepared without milk, cream, or other dairy foods.  A vitamin or mineral supplement may be necessary. Consult your caregiver or Registered Dietitian.  Lactose is also found in many prescription and over-the-counter medicines.  Soy milk and lactose-free supplements may be used as an alternative to milk. CHOOSING FOODS Breads and Starches  Allowed: Breads and rolls made without milk. [place], [place], or [place] bread. Soda crackers, graham crackers. Any crackers prepared without lactose. Cooked or dry cereals prepared without lactose (read labels). Any potatoes,  pasta, or rice prepared without milk or lactose. Popcorn.  Avoid: Breads and rolls that contain milk. Prepared mixes such as muffins, biscuits, waffles, pancakes. Sweet rolls, donuts, [place] toast (if  made with milk or lactose). Zwieback crackers, corn curls, or any crackers that contain lactose. Cooked or dry cereals prepared with lactose (read labels). Instant potatoes, frozen [place] fries, scalloped or au gratin potatoes. Vegetables  Allowed: Fresh, frozen, and canned vegetables.  Avoid: Creamed or breaded vegetables. Vegetables in a cheese sauce or with lactose-containing margarines. Fruit  Allowed: All fresh, canned, or frozen fruits that are not processed with lactose.  Avoid: Any canned or frozen fruits processed with lactose. Meat and Meat Substitutes  Allowed: Plain beef, chicken, fish, [place], lamb, veal, pork, or ham. Kosher prepared meat products. Strained or junior meats that do not contain milk. Eggs, soy meat substitutes, nuts.  Avoid: Scrambled eggs, omelets, and souffles that contain milk. Creamed or breaded meat, fish, or fowl. Sausage products such as wieners, liver sausage, or cold cuts that contain milk solids. Cheese, cottage cheese, or cheese spreads. Milk  Allowed: None.  Avoid: Milk (whole, 2%, skim, or chocolate). Evaporated, powdered, or condensed milk. Malted milk. Soups and Combination Foods  Allowed: Bouillon, broth, vegetable soups, clear soups, consomms. Homemade soups made with allowed ingredients. Combination or prepared foods that do not contain milk or milk products (read labels).  Avoid: Cream soups, chowders, commercially prepared soups containing lactose. Macaroni and cheese, pizza. Combination or prepared foods that contain milk or milk products. Desserts and Sweets  Allowed: Water and fruit ices, gelatin, angel food cake. Homemade cookies, pies, or cakes made from allowed ingredients. Pudding (if made with water or a milk substitute). Lactose-free tofu desserts. Sugar, honey, corn syrup, jam, jelly, marmalade, molasses (beet sugar). Pure sugar candy, marshmallows.  Avoid: Ice cream, ice milk, sherbet, custard, pudding, frozen yogurt.  Commercial cake and cookie mixes. Desserts that contain chocolate. Pie crust made with milk-containing margarine. Reduced calorie desserts made with a sugar substitute that contains lactose. Toffee, peppermint, butterscotch, chocolate, caramels. Fats and Oils  Allowed: Butter (as tolerated, contains very small amounts of lactose). Margarines and dressings that do not contain milk. Vegetable oils, shortening, mayonnaise, nondairy cream and whipped toppings without lactose or milk solids added. Berniece Salines.  Avoid: Margarines and salad dressings containing milk. Cream, cream cheese, peanut butter with added milk solids, sour cream, chip dips made with sour cream. Beverages  Allowed: Carbonated drinks, tea, coffee and freeze-dried coffee, some instant coffees (check labels). Fruit drinks, fruit and vegetable juice, rice or soy milk.  Avoid: Hot chocolate. Some cocoas, some instant coffees, instant iced teas, powdered fruit drinks (read labels). Condiments  Allowed: Soy sauce, carob powder, olives, gravy made with water, baker's cocoa, pickles, pure seasonings and spices, wine, pure monosodium glutamate, catsup, mustard.  Avoid: Some chewing gums, chocolate, some cocoas. Certain antibiotics and vitamin or mineral preparations. Spice blends if they contain milk products. MSG extender. Artificial sweeteners that contain lactose. Some nondairy creamers (read labels). SAMPLE MENU Breakfast  Orange juice.  Banana.  Bran cereal.  Nondairy creamer.  [place] bread, toasted.  Butter or milk-free margarine.  Coffee or tea. Lunch  Chicken breast.  Rice.  Green beans.  Butter or milk-free margarine.  Fresh melon.  Coffee or tea. Dinner  [employer].  Baked potato.  Butter or milk-free margarine.  Broccoli.  Lettuce salad with vinegar and oil dressing.  [employer]  Coffee or tea. Document Released: 02/14/2002 Document Revised: 11/17/2011  Document Reviewed:  11/22/2010 [facility] Patient Information 2014 [place], [place].

## 2013-05-04 NOTE — Assessment & Plan Note (Addendum)
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 She notes some improvement overall while taking abx; however, she now is experiencing continued abdominal discomfort, loose stool, and decreased oral intake due to fear of postprandial component. Her weight is stable, although she has lost weight overall from her baseline. Colonoscopy on file from Oct 2012 by Dr. Henrene Pastor noting tubular adenomas and internal hemorrhoids. Her low-volume hematochezia is likely secondary to this, and she would benefit from banding in the future once current issues are improved. She is on chronic Prednisone and does admit to intermittent NSAID use.   High suspicion for recurrent/refractory Cdiff. Will check Cdiff PCR now and likely pursue Dificid vs Vanc with tapering. Continue Probiotic. Trial of Levsin instead of Bentyl Lactose-free diet and avoid NSAIDs.  CT angiogram: I'm concerned about her weight loss, abdominal pain, and would like to rule out mesenteric ischemia or other occult process. May simply be dealing with recurrent Cdiff.  Consider flex sig if negative work-up and/or persistent symptoms despite supportive measures/treatment Proctozone for hemorrhoids: banding as outpatient once current illness addressed, do not pursue right now due to concern for Cdiff 6 week f/u.

## 2013-05-04 NOTE — Progress Notes (Signed)
CC'd to PCP 

## 2013-05-05 ENCOUNTER — Ambulatory Visit (HOSPITAL_COMMUNITY)
Admission: RE | Admit: 2013-05-05 | Discharge: 2013-05-05 | Disposition: A | Discharge status: Home / Self Care | Payer: Medicare Other | Source: Ambulatory Visit | Attending: Gastroenterology | Admitting: Gastroenterology

## 2013-05-05 DIAGNOSIS — R634 Abnormal weight loss: Secondary | ICD-10-CM | POA: Insufficient documentation

## 2013-05-05 DIAGNOSIS — R933 Abnormal findings on diagnostic imaging of other parts of digestive tract: Secondary | ICD-10-CM | POA: Insufficient documentation

## 2013-05-05 DIAGNOSIS — R109 Unspecified abdominal pain: Secondary | ICD-10-CM | POA: Insufficient documentation

## 2013-05-05 DIAGNOSIS — R195 Other fecal abnormalities: Secondary | ICD-10-CM

## 2013-05-05 LAB — BASIC METABOLIC PANEL
BUN: 11 mg/dL (ref 6–23)
CO2: 23 mEq/L (ref 19–32)
Calcium: 9.4 mg/dL (ref 8.4–10.5)
Chloride: 110 mEq/L (ref 96–112)
Creat: 0.89 mg/dL (ref 0.50–1.10)
Glucose, Bld: 88 mg/dL (ref 70–99)
Potassium: 4.4 mEq/L (ref 3.5–5.3)
Sodium: 141 mEq/L (ref 135–145)

## 2013-05-05 MED ORDER — IOHEXOL 350 MG/ML SOLN
100.0000 mL | Freq: Once | INTRAVENOUS | Status: AC | PRN
  Administered 2013-05-05: 100 mL via INTRAVENOUS

## 2013-05-06 ENCOUNTER — Telehealth: Payer: Self-pay | Admitting: Internal Medicine

## 2013-05-06 LAB — CLOSTRIDIUM DIFFICILE BY PCR: Toxigenic C. Difficile by PCR: DETECTED — CR

## 2013-05-06 NOTE — Telephone Encounter (Signed)
[facility] called to inform me her C. difficile toxin assay on stool is positive. I called the patient and informed her of this result. I have called [facility] prescribed vancomycin 125 mg orally 4 times a day x14 days then 3 times a day x7 days then twice daily x7 days then once daily x7 days then off. I advised her to continue Align,  probiotic. I reviewed good hygiene practices at home.  We'll arrange an office followup in 6 weeks. She is to call if she is not feeling better by the first of next week.

## 2013-05-10 NOTE — Telephone Encounter (Signed)
Jordan [name], please schedule follow up ov.

## 2013-05-12 ENCOUNTER — Encounter: Payer: Self-pay | Admitting: Internal Medicine

## 2013-05-12 NOTE — Telephone Encounter (Signed)
Pt is aware of OV on 10/13 at 130 with AS and appt card was mailed

## 2013-05-17 NOTE — Progress Notes (Signed)
Quick Note:  CT reviewed.  Diffuse wall thickening of entire colon.  Cdiff PCR positive.  Treatment with Vanc already intitation.  No mesenteric ischemia. ______

## 2013-06-20 ENCOUNTER — Ambulatory Visit (INDEPENDENT_AMBULATORY_CARE_PROVIDER_SITE_OTHER): Payer: Medicare Other | Admitting: Gastroenterology

## 2013-06-20 ENCOUNTER — Encounter: Payer: Self-pay | Admitting: Gastroenterology

## 2013-06-20 VITALS — BP 157/83 | HR 82 | Temp 97.6°F | Ht 63.0 in | Wt 134.6 lb

## 2013-06-20 DIAGNOSIS — A0472 Enterocolitis due to Clostridium difficile, not specified as recurrent: Secondary | ICD-10-CM | POA: Insufficient documentation

## 2013-06-20 NOTE — Progress Notes (Signed)
Referring Provider: Alonza Bogus, MD Primary Care Physician:  Alonza Bogus, MD Primary GI: Dr. Gala Romney   Chief Complaint  Patient presents with  . Follow-up    HPI:   Jordan [name] presents today in follow-up for recurrent Cdiff. Placed on Vanc taper on 8/29. CTA negative for mesenteric ischemia in the past. Weight fairly stable. BM usually twice a day, sometimes three at the most. Soft. Notes when she has "got to go", it is pure liquid. Lower abdominal discomfort improved. Intermittent low-volume hematochezia. Hydrocortisone cream helped some. Occasional rectal burning. Appetite good. Restarted Bentyl this weekend. Feels overall much improved. She is actually hesitant in stating how well she is doing for fear of "messing it up".    Past Medical History  Diagnosis Date  . Allergy     seasonal  . Asthma   . Blood transfusion   . Cataract     small don't know which eye.  Marland Kitchen COPD (chronic obstructive pulmonary disease)   . Depression   . Hyperlipidemia   . Hypertension   . Myocardial infarction   . Osteoporosis   . IBS (irritable bowel syndrome)   . C. difficile colitis     2007: treated with Flagyl and Vanc, 2014: treated with vanc, Aug 2014: Vanc taper    Past Surgical History  Procedure Laterality Date  . Appendectomy    . Gastric fundoplication    . Oophorectomy    . Neck surgery    . Cholecystectomy    . Knee arthroscopy  x2  . Colonoscopy   09/24/2006    RMR: Normal rectum, left-sided diverticula  . Esophagogastroduodenoscopy  04/07/2006    [id number] esophagus s/p placement of Bravo pH probe  . Colonoscopy  Oct 2012    Dr. Henrene Pastor: tubular adenomas, moderative diverticulosis, internal hemorrhoids    Current Outpatient Prescriptions  Medication Sig Dispense Refill  . albuterol (PROVENTIL HFA;VENTOLIN HFA) 108 (90 BASE) MCG/ACT inhaler Inhale 2 puffs into the lungs every 6 (six) hours as needed for wheezing.      . dicyclomine (BENTYL) 10 MG capsule Take 1  capsule (10 mg total) by mouth 4 (four) times daily -  before meals and at bedtime.  120 capsule  3  . FLUoxetine (PROZAC) 40 MG capsule Take 40 mg by mouth daily.        . Fluticasone-Salmeterol (ADVAIR) 500-50 MCG/DOSE AEPB Inhale 1 puff into the lungs 2 (two) times daily.        . hydrocortisone (PROCTOSOL HC) 2.5 % rectal cream Place rectally 2 (two) times daily.  30 g  0  . montelukast (SINGULAIR) 10 MG tablet Take 10 mg by mouth at bedtime.        . predniSONE (DELTASONE) 10 MG tablet Take 10 mg by mouth daily.        Marland Kitchen tiotropium (SPIRIVA) 18 MCG inhalation capsule Place 18 mcg into inhaler and inhale daily.        . verapamil (CALAN-SR) 120 MG CR tablet Take 120 mg by mouth at bedtime.        . hydrocortisone (PROCTOZONE-HC) 2.5 % rectal cream Place rectally 2 (two) times daily.  30 g  0   No current facility-administered medications for this visit.    Allergies as of 06/20/2013 - Review Complete 06/20/2013  Allergen Reaction Noted  . Barbiturates  05/04/2009  . Diltiazem hcl  05/04/2009  . Sulfa antibiotics  07/07/2011    Family History  Problem Relation Age of Onset  .  Heart disease Father   . Colon cancer Neg Hx     History   Social History  . Marital Status: Divorced    Spouse Name: N/A    Number of Children: N/A  . Years of Education: N/A   Social History Main Topics  . Smoking status: Never Smoker   . Smokeless tobacco: Never Used  . Alcohol Use: 3.0 oz/week    6 drink(s) per week     Comment: about 1 beer a day, maybe a few beers every other day  . Drug Use: No  . Sexual Activity: None   Other Topics Concern  . None   Social History Narrative  . None    Review of Systems: Negative unless mentioned in HPI.  Physical Exam: BP 157/83  Pulse 82  Temp(Src) 97.6 F (36.4 C) (Oral)  Ht 5\' 3"  (1.6 m)  Wt 134 lb 9.6 oz (61.054 kg)  BMI 23.85 kg/m2 General:   Alert and oriented. No distress noted. Pleasant and cooperative.  Head:  Normocephalic and  atraumatic. Eyes:  Conjuctiva clear without scleral icterus. Mouth:  Oral mucosa pink and moist. Good dentition. No lesions. Heart:  S1, S2 present without murmurs, rubs, or gallops. Regular rate and rhythm. Abdomen:  +BS, soft, non-tender and non-distended. No rebound or guarding. No HSM or masses noted. Msk:  Symmetrical without gross deformities. Normal posture. Extremities:  Without edema. Neurologic:  Alert and  oriented x4;  grossly normal neurologically. Skin:  Intact without significant lesions or rashes. Psych:  Alert and cooperative. Normal mood and affect.

## 2013-06-20 NOTE — Patient Instructions (Signed)
Continue to take a probiotic daily for the next 3 months.  Please call if diarrhea returns.  We will see you back in about a month to do a banding.

## 2013-06-20 NOTE — Assessment & Plan Note (Signed)
Cdiff positive in July and Aug 2014; July treated with Vanc. August treated with Vanc taper. Symptoms have resolved, bowel habits significantly improved. Abdominal discomfort resolved, weight stable. I have asked her to continue a probiotic for 3 more months. She has a known history of internal hemorrhoids and notes intermittent low-volume hematochezia. Last colonoscopy by Dr. Henrene Pastor in Oct 2012 with moderate diverticulosis, tubular adenomas, and internal hemorrhoids. Surveillance due 2017. She is interested in outpatient banding procedure for hemorrhoids. We will give her another 4 weeks to ensure she is doing well and have return at that time for banding. This will have given her plenty of time to recover from prior illness. Continue Bentyl as directed.

## 2013-06-21 NOTE — Progress Notes (Signed)
cc'd to pcp 

## 2013-07-13 ENCOUNTER — Encounter: Payer: Medicare Other | Admitting: Internal Medicine

## 2013-07-14 ENCOUNTER — Other Ambulatory Visit: Payer: Self-pay

## 2013-08-08 ENCOUNTER — Telehealth: Payer: Self-pay

## 2013-08-08 NOTE — Telephone Encounter (Signed)
Pt would like to reschedule her banding appointment from Tuesday to the next banding day. Manuela Schwartz can you please call her with the next day. Thanks

## 2013-08-08 NOTE — Telephone Encounter (Signed)
Pt is aware of new banding appt on 12/16 at 0830 with RMR

## 2013-08-09 ENCOUNTER — Encounter: Payer: Medicare Other | Admitting: Internal Medicine

## 2013-08-23 ENCOUNTER — Ambulatory Visit: Payer: Medicare Other | Admitting: Internal Medicine

## 2013-08-23 ENCOUNTER — Encounter: Payer: Self-pay | Admitting: Internal Medicine

## 2013-08-23 VITALS — BP 123/74 | HR 70 | Temp 97.6°F | Wt 137.6 lb

## 2013-08-23 DIAGNOSIS — K648 Other hemorrhoids: Secondary | ICD-10-CM

## 2013-08-23 NOTE — Patient Instructions (Addendum)
Avoid straining.  Benefiber 2 teaspoons twice daily  Limit toilet time to 2-3 minutes  Call with any interim problems  Schedule followup appointment in 2-3 weeks from now   

## 2013-08-23 NOTE — Progress Notes (Signed)
Impact banding procedure note:  The patient presents with symptomatic grade 2 hemorrhoids, unresponsive to maximal medical therapy, requesting rubber band ligation of her hemorrhoidal disease. All risks, benefits, and alternative forms of therapy were described and informed consent was obtained.  In the left lateral decubitus position anoscopic examination revealed  a prominent grade 2 hemorrhoids in the right anterior position (s).  The decision was made to band the right anterior internal hemorrhoid, and the [place] was used to perform band ligation without complication. Digital anorectal examination was then performed to assure proper positioning of the band  and to adjust the banded tissue as required. No pain or pinching with placement. Followup digital rectal exam revealed the band to be in adequate position. The patient was discharged home without pain or other issues. Dietary and behavioral recommendations were given and  along with follow-up instructions. The patient will return in 2-3 weeks  for followup and possible additional banding as required.  No complications were encountered and the patient tolerated the procedure well.

## 2013-09-05 ENCOUNTER — Other Ambulatory Visit (HOSPITAL_COMMUNITY): Payer: Self-pay | Admitting: Pulmonary Disease

## 2013-09-05 DIAGNOSIS — M25552 Pain in left hip: Secondary | ICD-10-CM

## 2013-09-05 DIAGNOSIS — M79605 Pain in left leg: Secondary | ICD-10-CM

## 2013-09-09 ENCOUNTER — Ambulatory Visit (HOSPITAL_COMMUNITY)
Admission: RE | Admit: 2013-09-09 | Discharge: 2013-09-09 | Disposition: A | Discharge status: Home / Self Care | Payer: Medicare Other | Source: Ambulatory Visit | Attending: Pulmonary Disease | Admitting: Pulmonary Disease

## 2013-09-09 DIAGNOSIS — M25552 Pain in left hip: Secondary | ICD-10-CM

## 2013-09-09 DIAGNOSIS — M545 Low back pain, unspecified: Secondary | ICD-10-CM | POA: Insufficient documentation

## 2013-09-09 DIAGNOSIS — M25559 Pain in unspecified hip: Secondary | ICD-10-CM | POA: Insufficient documentation

## 2013-09-09 DIAGNOSIS — M47817 Spondylosis without myelopathy or radiculopathy, lumbosacral region: Secondary | ICD-10-CM | POA: Insufficient documentation

## 2013-09-09 DIAGNOSIS — M79605 Pain in left leg: Secondary | ICD-10-CM

## 2013-09-09 DIAGNOSIS — M5126 Other intervertebral disc displacement, lumbar region: Secondary | ICD-10-CM | POA: Insufficient documentation

## 2013-09-09 DIAGNOSIS — M8448XA Pathological fracture, other site, initial encounter for fracture: Secondary | ICD-10-CM | POA: Insufficient documentation

## 2013-09-14 ENCOUNTER — Ambulatory Visit (INDEPENDENT_AMBULATORY_CARE_PROVIDER_SITE_OTHER): Payer: Medicare Other | Admitting: Orthopedic Surgery

## 2013-09-14 ENCOUNTER — Ambulatory Visit (INDEPENDENT_AMBULATORY_CARE_PROVIDER_SITE_OTHER): Payer: Medicare Other

## 2013-09-14 VITALS — BP 141/75 | Ht 63.0 in | Wt 135.0 lb

## 2013-09-14 DIAGNOSIS — M25529 Pain in unspecified elbow: Secondary | ICD-10-CM

## 2013-09-14 DIAGNOSIS — M7020 Olecranon bursitis, unspecified elbow: Secondary | ICD-10-CM

## 2013-09-14 DIAGNOSIS — M25522 Pain in left elbow: Secondary | ICD-10-CM

## 2013-09-14 DIAGNOSIS — M7022 Olecranon bursitis, left elbow: Secondary | ICD-10-CM | POA: Insufficient documentation

## 2013-09-14 NOTE — Progress Notes (Signed)
Patient ID: Jordan [name], female   DOB: 05/20/1946, 68 y.o.   MRN: [id number] Chief complaint left elbow swelling  Consult request Dr. Luan Pulling  The patient reports 4 week history of swelling and pain over the left elbow drained twice by the primary care physician. Complains of burning 7/10 constant pain  over the left elbow associated with heat. No fever. Does complain of fatigue  Shortness of breath and wheezing on review of systems history diarrhea and hemorrhoids. Urgency. Joint pain unsteady gait depression easy bruising excessive thirst adverse reactions to food  Note patient had 2 knee surgeries would like her right knee evaluated on her next visit  History of asthma osteoporosis hypertension  History of tonsillectomy appendectomy history of GERD cholecystectomy carpal tunnel release neck fusion  Current medications include prednisone Prozac Singulair post max Advair spur Riva rapid female pro-air vitamin D probiotic  Family history heart disease, arthritis, asthma. Social history retired divorced does not smoke occasional alcohol use.  BP 141/75  Ht 5\' 3"  (1.6 m)  Wt 135 lb (61.236 kg)  BMI 23.92 kg/m2  General appearance is normal, the patient is alert and oriented x3 with normal mood and affect. Body habitus endomorphic  There is a large amount of swelling of the left elbow consistent with bursitis range of motion in the elbow is normal stability test normal motor exam normal skin slightly red good pulse at the radial artery normal sensation and no lymph nodes are palpable  X-ray shows soft tissue swelling  The patient also has lumbar disc condition MRI shows herniated disc does not want have surgery on the elbow  We aspirated the elbow obtained 8 cc of amber colored fluid  Procedure note Aspiration left elbow  Verbal consent and timeout were completed The skin was prepped with alcohol and ethyl chloride An 18-gauge needle a 10 cc syringe was used to aspirate the  fluid  There were no complications Sterile Ace wrap was applied  The patient would like to come back for evaluation of her right knee with x-ray.

## 2013-09-20 ENCOUNTER — Encounter: Payer: Medicare Other | Admitting: Internal Medicine

## 2013-09-29 ENCOUNTER — Other Ambulatory Visit: Payer: Self-pay | Admitting: Neurosurgery

## 2013-10-05 ENCOUNTER — Encounter (HOSPITAL_COMMUNITY): Payer: Self-pay | Admitting: Pharmacy Technician

## 2013-10-10 ENCOUNTER — Encounter (HOSPITAL_COMMUNITY): Payer: Self-pay

## 2013-10-10 ENCOUNTER — Encounter (HOSPITAL_COMMUNITY)
Admission: RE | Admit: 2013-10-10 | Discharge: 2013-10-10 | Disposition: A | Discharge status: Home / Self Care | Payer: Medicare Other | Source: Ambulatory Visit | Attending: Neurosurgery | Admitting: Neurosurgery

## 2013-10-10 ENCOUNTER — Ambulatory Visit (HOSPITAL_COMMUNITY)
Admission: RE | Admit: 2013-10-10 | Discharge: 2013-10-10 | Disposition: A | Discharge status: Home / Self Care | Payer: Medicare Other | Source: Ambulatory Visit | Attending: Anesthesiology | Admitting: Anesthesiology

## 2013-10-10 DIAGNOSIS — Z01818 Encounter for other preprocedural examination: Secondary | ICD-10-CM | POA: Insufficient documentation

## 2013-10-10 DIAGNOSIS — Z01810 Encounter for preprocedural cardiovascular examination: Secondary | ICD-10-CM | POA: Insufficient documentation

## 2013-10-10 DIAGNOSIS — J45909 Unspecified asthma, uncomplicated: Secondary | ICD-10-CM | POA: Insufficient documentation

## 2013-10-10 DIAGNOSIS — Z01812 Encounter for preprocedural laboratory examination: Secondary | ICD-10-CM | POA: Insufficient documentation

## 2013-10-10 HISTORY — DX: Adverse effect of unspecified anesthetic, initial encounter: T41.45XA

## 2013-10-10 HISTORY — DX: Dorsalgia, unspecified: M54.9

## 2013-10-10 HISTORY — DX: Insomnia, unspecified: G47.00

## 2013-10-10 HISTORY — DX: Other chronic pain: G89.29

## 2013-10-10 HISTORY — DX: Personal history of other diseases of the respiratory system: Z87.09

## 2013-10-10 HISTORY — DX: Pneumonia, unspecified organism: J18.9

## 2013-10-10 HISTORY — DX: Paresthesia of skin: R20.2

## 2013-10-10 HISTORY — DX: Personal history of colonic polyps: Z86.010

## 2013-10-10 HISTORY — DX: Personal history of urinary calculi: Z87.442

## 2013-10-10 HISTORY — DX: Spontaneous ecchymoses: R23.3

## 2013-10-10 HISTORY — DX: Other skin changes: R23.8

## 2013-10-10 HISTORY — DX: Nocturia: R35.1

## 2013-10-10 HISTORY — DX: Muscle spasm of back: M62.830

## 2013-10-10 HISTORY — DX: Personal history of colon polyps, unspecified: Z86.0100

## 2013-10-10 HISTORY — DX: Bursopathy, unspecified: M71.9

## 2013-10-10 HISTORY — DX: Personal history of other diseases of the digestive system: Z87.19

## 2013-10-10 HISTORY — DX: Frequency of micturition: R35.0

## 2013-10-10 HISTORY — DX: Shortness of breath: R06.02

## 2013-10-10 LAB — CBC
HCT: 34.8 % — ABNORMAL LOW (ref 36.0–46.0)
Hemoglobin: 11.7 g/dL — ABNORMAL LOW (ref 12.0–15.0)
MCH: 31.5 pg (ref 26.0–34.0)
MCHC: 33.6 g/dL (ref 30.0–36.0)
MCV: 93.8 fL (ref 78.0–100.0)
Platelets: 233 10*3/uL (ref 150–400)
RBC: 3.71 MIL/uL — ABNORMAL LOW (ref 3.87–5.11)
RDW: 12.9 % (ref 11.5–15.5)
WBC: 13.2 10*3/uL — ABNORMAL HIGH (ref 4.0–10.5)

## 2013-10-10 LAB — BASIC METABOLIC PANEL
BUN: 18 mg/dL (ref 6–23)
CO2: 25 mEq/L (ref 19–32)
Calcium: 10 mg/dL (ref 8.4–10.5)
Chloride: 101 mEq/L (ref 96–112)
Creatinine, Ser: 0.97 mg/dL (ref 0.50–1.10)
GFR calc Af Amer: 68 mL/min — ABNORMAL LOW (ref >90)
GFR calc non Af Amer: 59 mL/min — ABNORMAL LOW (ref >90)
Glucose, Bld: 108 mg/dL — ABNORMAL HIGH (ref 70–99)
Potassium: 4.4 mEq/L (ref 3.7–5.3)
Sodium: 143 mEq/L (ref 137–147)

## 2013-10-10 LAB — SURGICAL PCR SCREEN
MRSA, PCR: NEGATIVE
Staphylococcus aureus: NEGATIVE

## 2013-10-10 NOTE — Progress Notes (Addendum)
Pt doesn't have a cardiologist  Medical Md is Dr.Hawkins   Heart cath done in 2007 at [facility]  Denies EKG or CXR in past   Echo report in epic from 2010  Stress test in epic from 2002 and 2010

## 2013-10-10 NOTE — Progress Notes (Signed)
Anesthesia Chart Review:  Patient is a 68 year old female scheduled for left L3-4 microdiskectomy on 10/17/13 by Dr. Arnoldo Morale.  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 Difficile colitis '07 and 04/2013, nephrolithiasis, gastric fundoplication X 2, cervical fusion, hernia repair, tonsillectomy, appendectomy, cholecystectomy '10. She reported that she woke up during one of her surgeries (oopherectomy). PCP and local pulmonologist is Dr. Sinda Du.  She also periodically seen Dr. Cyndie Chime with pulmonology at [facility].    EKG on 10/10/13 showed NSR, incomplete right BBB.  Overall, I think her EKG is stable since at least 05/04/09. By notes, had normal coronaries by cardiac cath at [facility] in 2007 in the setting of acute illness with Takotsubo cardiomyopathy.  Follow-up testing showed recovery of her EF.  She is no longer followed by cardiology.  Nuclear stress test on 05/11/09 showed: Negative stress nuclear myocardial study revealing markedly impaired exercise capacity, apparently due to asthmatic bronchitis, normal left ventricular size and normal left ventricular systolic function. Stress EKG was negative for ischemia. By scintigraphic imaging, myocardial perfusion was normal.  Echo on 05/11/09 showed: Left ventricle: The cavity size was normal. Wall thickness was normal. Systolic function was normal. The estimated ejection fraction was in the range of 60% to 65%. Wall motion was normal; there were no regional wall motion abnormalities.   CXR on 10/10/13 showed no acute cardiopulmonary disease.  Preoperative labs noted.  WBC 13.2,  H/H 11.7/34.9.  Cr 0.97, glucose 108.   She is doing well from a cardiopulmonary standpoint.  No CV/CHF symptoms. No recent URI/PNA.  She is no on home O2.  Her activity is limited by DOE, but this is stable.  She is compliant with her pulmonary regimen.   I reviewed cardiac history with anesthesiologist Dr. Marcie Bal.  If no new CV/CHF symptoms or other acute changes then it is anticipated that she can proceed as planned.  George Hugh [facility]/Anesthesiology Phone [phone] 10/11/2013 3:23 PM

## 2013-10-10 NOTE — Pre-Procedure Instructions (Signed)
Jordan [name]  10/10/2013   Your procedure is scheduled on:  Mon, Feb 9 @ 7:30 AM  Report to [facility]  at 5:30 AM.  Call this number if you have problems the morning of surgery: [phone]   Remember:   Do not eat food or drink liquids after midnight.   Take these medicines the morning of surgery with A SIP OF WATER: Albuterol<Bring Your Inhaler With You>,Prozac(Fluoxetine),Pain Pill(if needed),Advair(Fluticasone),Prednisone(Deltasone), and Spiriva                No Goody's,BC's,Aleve,Aspirin,Ibuprofen,Fish Oil,or any Herbal Medications  Do not wear jewelry, make-up or nail polish.  Do not wear lotions, powders, or perfumes. You may wear deodorant.  Do not shave 48 hours prior to surgery.   Do not bring valuables to the hospital.  [facility] is not responsible                  for any belongings or valuables.               Contacts, dentures or bridgework may not be worn into surgery.  Leave suitcase in the car. After surgery it may be brought to your room.  For patients admitted to the hospital, discharge time is determined by your                treatment team.               Patients discharged the day of surgery will not be allowed to drive  home.    Special Instructions: Shower using CHG 2 nights before surgery and the night before surgery.  If you shower the day of surgery use CHG.  Use special wash - you have one bottle of CHG for all showers.  You should use approximately 1/3 of the bottle for each shower.   Please read over the following fact sheets that you were given: Pain Booklet, Coughing and Deep Breathing, MRSA Information and Surgical Site Infection Prevention

## 2013-10-14 ENCOUNTER — Encounter: Payer: Self-pay | Admitting: Internal Medicine

## 2013-10-16 MED ORDER — CEFAZOLIN SODIUM-DEXTROSE 2-3 GM-% IV SOLR
2.0000 g | INTRAVENOUS | Status: AC
  Administered 2013-10-17: 2 g via INTRAVENOUS

## 2013-10-17 ENCOUNTER — Encounter (HOSPITAL_COMMUNITY): Payer: Medicare Other | Admitting: Vascular Surgery

## 2013-10-17 ENCOUNTER — Inpatient Hospital Stay (HOSPITAL_COMMUNITY): Payer: Medicare Other | Admitting: Certified Registered"

## 2013-10-17 ENCOUNTER — Ambulatory Visit (HOSPITAL_COMMUNITY)
Admission: RE | Admit: 2013-10-17 | Discharge: 2013-10-18 | Disposition: A | Discharge status: Home / Self Care | Payer: Medicare Other | Source: Ambulatory Visit | Attending: Neurosurgery | Admitting: Neurosurgery

## 2013-10-17 ENCOUNTER — Encounter (HOSPITAL_COMMUNITY)
Admission: RE | Disposition: A | Discharge status: Home / Self Care | Payer: Self-pay | Source: Ambulatory Visit | Attending: Neurosurgery

## 2013-10-17 ENCOUNTER — Encounter (HOSPITAL_COMMUNITY): Payer: Self-pay | Admitting: Surgery

## 2013-10-17 ENCOUNTER — Inpatient Hospital Stay (HOSPITAL_COMMUNITY): Payer: Medicare Other

## 2013-10-17 DIAGNOSIS — K449 Diaphragmatic hernia without obstruction or gangrene: Secondary | ICD-10-CM | POA: Insufficient documentation

## 2013-10-17 DIAGNOSIS — IMO0002 Reserved for concepts with insufficient information to code with codable children: Secondary | ICD-10-CM | POA: Insufficient documentation

## 2013-10-17 DIAGNOSIS — F3289 Other specified depressive episodes: Secondary | ICD-10-CM | POA: Insufficient documentation

## 2013-10-17 DIAGNOSIS — E785 Hyperlipidemia, unspecified: Secondary | ICD-10-CM | POA: Insufficient documentation

## 2013-10-17 DIAGNOSIS — J449 Chronic obstructive pulmonary disease, unspecified: Secondary | ICD-10-CM | POA: Insufficient documentation

## 2013-10-17 DIAGNOSIS — G4700 Insomnia, unspecified: Secondary | ICD-10-CM | POA: Insufficient documentation

## 2013-10-17 DIAGNOSIS — M810 Age-related osteoporosis without current pathological fracture: Secondary | ICD-10-CM | POA: Insufficient documentation

## 2013-10-17 DIAGNOSIS — R351 Nocturia: Secondary | ICD-10-CM | POA: Insufficient documentation

## 2013-10-17 DIAGNOSIS — I5181 Takotsubo syndrome: Secondary | ICD-10-CM | POA: Insufficient documentation

## 2013-10-17 DIAGNOSIS — I10 Essential (primary) hypertension: Secondary | ICD-10-CM | POA: Insufficient documentation

## 2013-10-17 DIAGNOSIS — J4489 Other specified chronic obstructive pulmonary disease: Secondary | ICD-10-CM | POA: Insufficient documentation

## 2013-10-17 DIAGNOSIS — F329 Major depressive disorder, single episode, unspecified: Secondary | ICD-10-CM | POA: Insufficient documentation

## 2013-10-17 DIAGNOSIS — M48061 Spinal stenosis, lumbar region without neurogenic claudication: Principal | ICD-10-CM | POA: Diagnosis present

## 2013-10-17 DIAGNOSIS — I252 Old myocardial infarction: Secondary | ICD-10-CM | POA: Insufficient documentation

## 2013-10-17 DIAGNOSIS — R0602 Shortness of breath: Secondary | ICD-10-CM | POA: Insufficient documentation

## 2013-10-17 DIAGNOSIS — Z7983 Long term (current) use of bisphosphonates: Secondary | ICD-10-CM | POA: Insufficient documentation

## 2013-10-17 HISTORY — PX: LUMBAR LAMINECTOMY/DECOMPRESSION MICRODISCECTOMY: SHX5026

## 2013-10-17 SURGERY — LUMBAR LAMINECTOMY/DECOMPRESSION MICRODISCECTOMY 1 LEVEL
Anesthesia: General | Site: Spine Lumbar | Laterality: Left

## 2013-10-17 MED ORDER — PROPOFOL 10 MG/ML IV BOLUS
INTRAVENOUS | Status: DC | PRN
  Administered 2013-10-17: 160 mg via INTRAVENOUS

## 2013-10-17 MED ORDER — MIDAZOLAM HCL 5 MG/5ML IJ SOLN
INTRAMUSCULAR | Status: DC | PRN
  Administered 2013-10-17 (×2): .5 mg via INTRAVENOUS

## 2013-10-17 MED ORDER — MIDAZOLAM HCL 2 MG/2ML IJ SOLN
INTRAMUSCULAR | Status: AC
Start: 2013-10-17 — End: 2013-10-17
  Filled 2013-10-17: qty 2

## 2013-10-17 MED ORDER — ACETAMINOPHEN 325 MG PO TABS: 650.0000 mg | ORAL_TABLET | ORAL | Status: DC | PRN

## 2013-10-17 MED ORDER — MORPHINE SULFATE 2 MG/ML IJ SOLN: 1.0000 mg | INTRAMUSCULAR | Status: DC | PRN

## 2013-10-17 MED ORDER — MOMETASONE FURO-FORMOTEROL FUM 200-5 MCG/ACT IN AERO
2.0000 | INHALATION_SPRAY | Freq: Two times a day (BID) | RESPIRATORY_TRACT | Status: DC
  Administered 2013-10-17 – 2013-10-18 (×2): 2 via RESPIRATORY_TRACT
  Filled 2013-10-17: qty 8.8

## 2013-10-17 MED ORDER — ONDANSETRON HCL 4 MG/2ML IJ SOLN
INTRAMUSCULAR | Status: AC
  Filled 2013-10-17: qty 2

## 2013-10-17 MED ORDER — BACITRACIN ZINC 500 UNIT/GM EX OINT
TOPICAL_OINTMENT | CUTANEOUS | Status: DC | PRN
  Administered 2013-10-17: 1 via TOPICAL

## 2013-10-17 MED ORDER — ROCURONIUM BROMIDE 50 MG/5ML IV SOLN
INTRAVENOUS | Status: AC
  Filled 2013-10-17: qty 1

## 2013-10-17 MED ORDER — FENTANYL CITRATE 0.05 MG/ML IJ SOLN
INTRAMUSCULAR | Status: AC
  Filled 2013-10-17: qty 5

## 2013-10-17 MED ORDER — GLYCOPYRROLATE 0.2 MG/ML IJ SOLN
INTRAMUSCULAR | Status: AC
  Filled 2013-10-17: qty 3

## 2013-10-17 MED ORDER — LIDOCAINE HCL (CARDIAC) 20 MG/ML IV SOLN
INTRAVENOUS | Status: AC
  Filled 2013-10-17: qty 5

## 2013-10-17 MED ORDER — ROCURONIUM BROMIDE 100 MG/10ML IV SOLN
INTRAVENOUS | Status: DC | PRN
  Administered 2013-10-17: 40 mg via INTRAVENOUS

## 2013-10-17 MED ORDER — THROMBIN 5000 UNITS EX SOLR
CUTANEOUS | Status: DC | PRN
  Administered 2013-10-17 (×2): 5000 [IU] via TOPICAL

## 2013-10-17 MED ORDER — METHYLPREDNISOLONE SODIUM SUCC 125 MG IJ SOLR
80.0000 mg | Freq: Four times a day (QID) | INTRAMUSCULAR | Status: AC
  Administered 2013-10-17 (×3): 80 mg via INTRAVENOUS
  Filled 2013-10-17 (×2): qty 1.28
  Filled 2013-10-17: qty 2
  Filled 2013-10-17: qty 1.28

## 2013-10-17 MED ORDER — HYDROCORTISONE NA SUCCINATE PF 1000 MG IJ SOLR
INTRAMUSCULAR | Status: DC | PRN
  Administered 2013-10-17: 100 mg via INTRAVENOUS

## 2013-10-17 MED ORDER — 0.9 % SODIUM CHLORIDE (POUR BTL) OPTIME
TOPICAL | Status: DC | PRN
  Administered 2013-10-17: 1000 mL

## 2013-10-17 MED ORDER — LIDOCAINE HCL (CARDIAC) 20 MG/ML IV SOLN
INTRAVENOUS | Status: DC | PRN
  Administered 2013-10-17: 100 mg via INTRAVENOUS

## 2013-10-17 MED ORDER — LACTATED RINGERS IV SOLN: INTRAVENOUS | Status: DC

## 2013-10-17 MED ORDER — OXYCODONE-ACETAMINOPHEN 5-325 MG PO TABS
1.0000 | ORAL_TABLET | ORAL | Status: DC | PRN
  Administered 2013-10-17 – 2013-10-18 (×4): 2 via ORAL
  Filled 2013-10-17 (×4): qty 2

## 2013-10-17 MED ORDER — DOCUSATE SODIUM 100 MG PO CAPS
100.0000 mg | ORAL_CAPSULE | Freq: Two times a day (BID) | ORAL | Status: DC
  Administered 2013-10-17 (×2): 100 mg via ORAL
  Filled 2013-10-17 (×4): qty 1

## 2013-10-17 MED ORDER — CEFAZOLIN SODIUM-DEXTROSE 2-3 GM-% IV SOLR
2.0000 g | Freq: Three times a day (TID) | INTRAVENOUS | Status: AC
  Administered 2013-10-17 – 2013-10-18 (×2): 2 g via INTRAVENOUS
  Filled 2013-10-17 (×2): qty 50

## 2013-10-17 MED ORDER — MENTHOL 3 MG MT LOZG: 1.0000 | LOZENGE | OROMUCOSAL | Status: DC | PRN

## 2013-10-17 MED ORDER — SODIUM CHLORIDE 0.9 % IR SOLN
Status: DC | PRN
  Administered 2013-10-17: 08:00:00

## 2013-10-17 MED ORDER — PREDNISONE 10 MG PO TABS
10.0000 mg | ORAL_TABLET | Freq: Every day | ORAL | Status: DC
  Administered 2013-10-18: 10 mg via ORAL
  Filled 2013-10-17 (×2): qty 1

## 2013-10-17 MED ORDER — PHENOL 1.4 % MT LIQD
1.0000 | OROMUCOSAL | Status: DC | PRN
Start: 2013-10-17 — End: 2013-10-18

## 2013-10-17 MED ORDER — NEOSTIGMINE METHYLSULFATE 1 MG/ML IJ SOLN
INTRAMUSCULAR | Status: DC | PRN
  Administered 2013-10-17: 3 mg via INTRAVENOUS

## 2013-10-17 MED ORDER — FLUOXETINE HCL 20 MG PO CAPS
40.0000 mg | ORAL_CAPSULE | Freq: Every day | ORAL | Status: DC
  Administered 2013-10-17: 40 mg via ORAL
  Filled 2013-10-17 (×2): qty 2

## 2013-10-17 MED ORDER — LACTATED RINGERS IV SOLN
INTRAVENOUS | Status: DC | PRN
  Administered 2013-10-17 (×2): via INTRAVENOUS

## 2013-10-17 MED ORDER — ALUM & MAG HYDROXIDE-SIMETH 200-200-20 MG/5ML PO SUSP: 30.0000 mL | Freq: Four times a day (QID) | ORAL | Status: DC | PRN

## 2013-10-17 MED ORDER — FENTANYL CITRATE 0.05 MG/ML IJ SOLN
25.0000 ug | INTRAMUSCULAR | Status: DC | PRN
  Administered 2013-10-17 (×2): 25 ug via INTRAVENOUS
  Administered 2013-10-17: 50 ug via INTRAVENOUS

## 2013-10-17 MED ORDER — PROPOFOL 10 MG/ML IV BOLUS
INTRAVENOUS | Status: AC
  Filled 2013-10-17: qty 20

## 2013-10-17 MED ORDER — GLYCOPYRROLATE 0.2 MG/ML IJ SOLN
INTRAMUSCULAR | Status: DC | PRN
  Administered 2013-10-17: .5 mg via INTRAVENOUS

## 2013-10-17 MED ORDER — FENTANYL CITRATE 0.05 MG/ML IJ SOLN
INTRAMUSCULAR | Status: DC | PRN
  Administered 2013-10-17 (×3): 50 ug via INTRAVENOUS
  Administered 2013-10-17: 100 ug via INTRAVENOUS
  Administered 2013-10-17: 50 ug via INTRAVENOUS
  Administered 2013-10-17 (×2): 25 ug via INTRAVENOUS
  Administered 2013-10-17: 50 ug via INTRAVENOUS
  Administered 2013-10-17: 100 ug via INTRAVENOUS

## 2013-10-17 MED ORDER — MONTELUKAST SODIUM 10 MG PO TABS
10.0000 mg | ORAL_TABLET | Freq: Every day | ORAL | Status: DC
  Administered 2013-10-17: 10 mg via ORAL
  Filled 2013-10-17 (×2): qty 1

## 2013-10-17 MED ORDER — TIOTROPIUM BROMIDE MONOHYDRATE 18 MCG IN CAPS
18.0000 ug | ORAL_CAPSULE | Freq: Every day | RESPIRATORY_TRACT | Status: DC
  Administered 2013-10-18: 18 ug via RESPIRATORY_TRACT
  Filled 2013-10-17: qty 5

## 2013-10-17 MED ORDER — ONDANSETRON HCL 4 MG/2ML IJ SOLN: 4.0000 mg | INTRAMUSCULAR | Status: DC | PRN

## 2013-10-17 MED ORDER — ONDANSETRON HCL 4 MG/2ML IJ SOLN
INTRAMUSCULAR | Status: DC | PRN
  Administered 2013-10-17: 4 mg via INTRAVENOUS

## 2013-10-17 MED ORDER — BUPIVACAINE-EPINEPHRINE PF 0.5-1:200000 % IJ SOLN
INTRAMUSCULAR | Status: DC | PRN
  Administered 2013-10-17: 10 mL

## 2013-10-17 MED ORDER — ACETAMINOPHEN 650 MG RE SUPP: 650.0000 mg | RECTAL | Status: DC | PRN

## 2013-10-17 MED ORDER — HYDROCODONE-ACETAMINOPHEN 5-325 MG PO TABS: 1.0000 | ORAL_TABLET | ORAL | Status: DC | PRN

## 2013-10-17 MED ORDER — DIAZEPAM 5 MG PO TABS: 5.0000 mg | ORAL_TABLET | Freq: Four times a day (QID) | ORAL | Status: DC | PRN

## 2013-10-17 MED ORDER — NEOSTIGMINE METHYLSULFATE 1 MG/ML IJ SOLN
INTRAMUSCULAR | Status: AC
  Filled 2013-10-17: qty 10

## 2013-10-17 MED ORDER — FENTANYL CITRATE 0.05 MG/ML IJ SOLN
INTRAMUSCULAR | Status: AC
  Filled 2013-10-17: qty 2

## 2013-10-17 MED ORDER — VERAPAMIL HCL ER 120 MG PO TBCR
120.0000 mg | EXTENDED_RELEASE_TABLET | Freq: Every day | ORAL | Status: DC
  Administered 2013-10-17: 120 mg via ORAL
  Filled 2013-10-17 (×2): qty 1

## 2013-10-17 MED ORDER — HEMOSTATIC AGENTS (NO CHARGE) OPTIME
TOPICAL | Status: DC | PRN
  Administered 2013-10-17: 1 via TOPICAL

## 2013-10-17 MED ORDER — ALBUTEROL SULFATE HFA 108 (90 BASE) MCG/ACT IN AERS
2.0000 | INHALATION_SPRAY | Freq: Four times a day (QID) | RESPIRATORY_TRACT | Status: DC | PRN
  Filled 2013-10-17: qty 6.7

## 2013-10-17 MED ORDER — FLUOXETINE HCL 40 MG PO CAPS
40.0000 mg | ORAL_CAPSULE | Freq: Every day | ORAL | Status: DC
  Filled 2013-10-17: qty 1

## 2013-10-17 SURGICAL SUPPLY — 53 items
BAG DECANTER FOR FLEXI CONT (MISCELLANEOUS) ×2 IMPLANT
BENZOIN TINCTURE PRP APPL 2/3 (GAUZE/BANDAGES/DRESSINGS) ×2 IMPLANT
BLADE SURG ROTATE 9660 (MISCELLANEOUS) IMPLANT
BRUSH SCRUB EZ PLAIN DRY (MISCELLANEOUS) ×2 IMPLANT
BUR ACORN 6.0 (BURR) ×2 IMPLANT
BUR MATCHSTICK NEURO 3.0 LAGG (BURR) ×2 IMPLANT
CANISTER SUCT 3000ML (MISCELLANEOUS) ×2 IMPLANT
CONT SPEC 4OZ CLIKSEAL STRL BL (MISCELLANEOUS) ×2 IMPLANT
DRAPE LAPAROTOMY 100X72X124 (DRAPES) ×2 IMPLANT
DRAPE MICROSCOPE LEICA (MISCELLANEOUS) ×2 IMPLANT
DRAPE POUCH INSTRU U-SHP 10X18 (DRAPES) ×2 IMPLANT
DRAPE SURG 17X23 STRL (DRAPES) ×8 IMPLANT
ELECT BLADE 4.0 EZ CLEAN MEGAD (MISCELLANEOUS) ×2
ELECT REM PT RETURN 9FT ADLT (ELECTROSURGICAL) ×2
ELECTRODE BLDE 4.0 EZ CLN MEGD (MISCELLANEOUS) ×1 IMPLANT
ELECTRODE REM PT RTRN 9FT ADLT (ELECTROSURGICAL) ×1 IMPLANT
GAUZE SPONGE 4X4 16PLY XRAY LF (GAUZE/BANDAGES/DRESSINGS) IMPLANT
GLOVE BIO SURGEON STRL SZ8.5 (GLOVE) ×2 IMPLANT
GLOVE BIOGEL PI IND STRL 8.5 (GLOVE) ×1 IMPLANT
GLOVE BIOGEL PI INDICATOR 8.5 (GLOVE) ×1
GLOVE ECLIPSE 8.5 STRL (GLOVE) ×2 IMPLANT
GLOVE EXAM NITRILE LRG STRL (GLOVE) IMPLANT
GLOVE EXAM NITRILE MD LF STRL (GLOVE) IMPLANT
GLOVE EXAM NITRILE XL STR (GLOVE) IMPLANT
GLOVE EXAM NITRILE XS STR PU (GLOVE) IMPLANT
GLOVE SS BIOGEL STRL SZ 7 (GLOVE) ×1 IMPLANT
GLOVE SS BIOGEL STRL SZ 8 (GLOVE) ×1 IMPLANT
GLOVE SUPERSENSE BIOGEL SZ 7 (GLOVE) ×1
GLOVE SUPERSENSE BIOGEL SZ 8 (GLOVE) ×1
GOWN BRE IMP SLV AUR LG STRL (GOWN DISPOSABLE) IMPLANT
GOWN BRE IMP SLV AUR XL STRL (GOWN DISPOSABLE) ×2 IMPLANT
GOWN STRL REIN 2XL LVL4 (GOWN DISPOSABLE) ×4 IMPLANT
KIT BASIN OR (CUSTOM PROCEDURE TRAY) ×2 IMPLANT
KIT ROOM TURNOVER OR (KITS) ×2 IMPLANT
NEEDLE HYPO 21X1.5 SAFETY (NEEDLE) IMPLANT
NEEDLE HYPO 22GX1.5 SAFETY (NEEDLE) ×2 IMPLANT
NS IRRIG 1000ML POUR BTL (IV SOLUTION) ×2 IMPLANT
PACK LAMINECTOMY NEURO (CUSTOM PROCEDURE TRAY) ×2 IMPLANT
PAD ARMBOARD 7.5X6 YLW CONV (MISCELLANEOUS) ×10 IMPLANT
PATTIES SURGICAL .5 X1 (DISPOSABLE) IMPLANT
RUBBERBAND STERILE (MISCELLANEOUS) ×4 IMPLANT
SPONGE GAUZE 4X4 12PLY (GAUZE/BANDAGES/DRESSINGS) ×2 IMPLANT
SPONGE SURGIFOAM ABS GEL SZ50 (HEMOSTASIS) ×2 IMPLANT
STRIP CLOSURE SKIN 1/2X4 (GAUZE/BANDAGES/DRESSINGS) ×2 IMPLANT
SUT VIC AB 1 CT1 18XBRD ANBCTR (SUTURE) ×1 IMPLANT
SUT VIC AB 1 CT1 8-18 (SUTURE) ×1
SUT VIC AB 2-0 CP2 18 (SUTURE) ×2 IMPLANT
SYR 20CC LL (SYRINGE) IMPLANT
SYR 20ML ECCENTRIC (SYRINGE) ×2 IMPLANT
TAPE CLOTH SURG 4X10 WHT LF (GAUZE/BANDAGES/DRESSINGS) ×2 IMPLANT
TOWEL OR 17X24 6PK STRL BLUE (TOWEL DISPOSABLE) ×2 IMPLANT
TOWEL OR 17X26 10 PK STRL BLUE (TOWEL DISPOSABLE) ×2 IMPLANT
WATER STERILE IRR 1000ML POUR (IV SOLUTION) ×2 IMPLANT

## 2013-10-17 NOTE — Progress Notes (Signed)
Patient ID: Jordan [name], female   DOB: March 08, 1946, 68 y.o.   MRN: [id number] Subjective:  The patient is alert and pleasant. She is in no apparent distress. She looks well.  Objective: Vital signs in last 24 hours: Temp:  [97.9 F (36.6 C)] 97.9 F (36.6 C) (02/09 0923) Pulse Rate:  [74-113] 108 (02/09 0935) Resp:  [12-18] 18 (02/09 0935) BP: (134-190)/(89-113) 190/89 mmHg (02/09 0935) SpO2:  [96 %-99 %] 96 % (02/09 0935)  Intake/Output from previous day:   Intake/Output this shift: Total I/O In: 1000 [I.V.:1000] Out: 50 [Blood:50]  Physical exam the patient is alert and pleasant. She is moving all 4 extremities well.  Lab Results: No results found for this basename: WBC, HGB, HCT, PLT,  in the last 72 hours BMET No results found for this basename: NA, K, CL, CO2, GLUCOSE, BUN, CREATININE, CALCIUM,  in the last 72 hours  Studies/Results: No results found.  Assessment/Plan: The patient is doing well.  LOS: 0 days     [name] D 10/17/2013, 9:38 AM

## 2013-10-17 NOTE — H&P (Signed)
Subjective: The patient is a 68 year old white female who complains of back and left leg pain consistent with a lumbar radiculopathy. She has failed medical management and was worked up with a lumbar MRI. This demonstrated a herniated disc at L3-4 on the left. I discussed the various treatment options including surgery. She has weighed the risks, benefits, and alternatives surgery and decided proceed with a left L3-4 discectomy.  Past Medical History  Diagnosis Date  . Allergy     seasonal  . Blood transfusion 2007    no abnormal reation noted   . COPD (chronic obstructive pulmonary disease)   . Hyperlipidemia     hx of-was on meds but has been off x 1 1/2 yrs  . Osteoporosis     takes Fosamax weekly  . IBS (irritable bowel syndrome)   . C. difficile colitis     2007: treated with Flagyl and Vanc, 2014: treated with vanc, Aug 2014: Vanc taper  . Hemorrhoids   . Asthma     uses Advair and Spiriva daily,Albuterol prn.Takes Singulair at bedtime and Prednisone daily  . Back spasm     takes Zanaflex daily as needed  . Depression     takes Prozac daily  . Complication of anesthesia     woke up during one surgery  . Hypertension     takes Verapamil nightly  . Shortness of breath     with exertion daily  . Pneumonia     MRSA pneumonia in 2007  . History of bronchitis     last time a yr ago  . Tingling     and pain in left leg  . Chronic back pain   . Bursitis     left elbow  . Bruises easily     d/t being on Prednisone  . H/O hiatal hernia   . History of colon polyps   . Urinary frequency   . History of kidney stones   . Nocturia   . Insomnia     takes Benadryl as needed  . Myocardial infarction 2007    Takotsubo cardiomyopathy    Past Surgical History  Procedure Laterality Date  . Appendectomy    . Gastric fundoplication      x 2  . Oophorectomy Right   . Neck surgery      fusion  . Cholecystectomy    . Knee arthroscopy Right   . Colonoscopy   09/24/2006   RMR: Normal rectum, left-sided diverticula  . Esophagogastroduodenoscopy  04/07/2006    [id number] esophagus s/p placement of Bravo pH probe  . Colonoscopy  Oct 2012    Dr. Henrene Pastor: tubular adenomas, moderative diverticulosis, internal hemorrhoids  . Tonsillectomy    . Cataract surgery Bilateral   . Hernia repair    . Carpal tunnel release Left   . Hemorrhoidal banding    . Cystoscopy      Allergies  Allergen Reactions  . Cardura [Doxazosin Mesylate] Hives  . Other     Strawberry-hives  . Sulfa Antibiotics Nausea Only  . Barbiturates Nausea And Vomiting and Rash    History  Substance Use Topics  . Smoking status: Never Smoker   . Smokeless tobacco: Never Used  . Alcohol Use: 0.0 oz/week     Comment: couple of beers daily    Family History  Problem Relation Age of Onset  . Heart disease Father   . Colon cancer Neg Hx    Prior to Admission medications   Medication Sig Start Date  End Date Taking? Authorizing Provider  albuterol (PROVENTIL HFA;VENTOLIN HFA) 108 (90 BASE) MCG/ACT inhaler Inhale 2 puffs into the lungs every 6 (six) hours as needed for wheezing.   Yes Historical Provider, MD  alendronate (FOSAMAX) 70 MG tablet Take 70 mg by mouth every Thursday. Take with a full glass of water on an empty stomach.   Yes Historical Provider, MD  Cholecalciferol (VITAMIN D-3) 5000 UNITS TABS Take 5,000 Units by mouth daily.   Yes Historical Provider, MD  FLUoxetine (PROZAC) 40 MG capsule Take 40 mg by mouth daily.     Yes Historical Provider, MD  Fluticasone-Salmeterol (ADVAIR) 500-50 MCG/DOSE AEPB Inhale 1 puff into the lungs 2 (two) times daily.     Yes Historical Provider, MD  HYDROcodone-acetaminophen (NORCO/VICODIN) 5-325 MG per tablet Take 1 tablet by mouth every 4 (four) hours as needed for moderate pain.   Yes Historical Provider, MD  montelukast (SINGULAIR) 10 MG tablet Take 10 mg by mouth at bedtime.     Yes Historical Provider, MD  predniSONE (DELTASONE) 10 MG tablet Take 10  mg by mouth daily.     Yes Historical Provider, MD  Probiotic Product (PROBIOTIC PO) Take 1 tablet by mouth every morning.   Yes Historical Provider, MD  tiotropium (SPIRIVA) 18 MCG inhalation capsule Place 18 mcg into inhaler and inhale daily.     Yes Historical Provider, MD  tiZANidine (ZANAFLEX) 4 MG tablet Take 4 mg by mouth 3 (three) times daily as needed for muscle spasms.   Yes Historical Provider, MD  verapamil (CALAN-SR) 120 MG CR tablet Take 120 mg by mouth at bedtime.     Yes Historical Provider, MD     Review of Systems  Positive ROS: As above  All other systems have been reviewed and were otherwise negative with the exception of those mentioned in the HPI and as above.  Objective: Vital signs in last 24 hours: Temp:  [97.9 F (36.6 C)] 97.9 F (36.6 C) (02/09 0641) Pulse Rate:  [74] 74 (02/09 0641) Resp:  [18] 18 (02/09 0641) BP: (134)/(90) 134/90 mmHg (02/09 0641) SpO2:  [99 %] 99 % (02/09 0641)  General Appearance: Alert, cooperative, no distress, Head: Normocephalic, without obvious abnormality, atraumatic Eyes: PERRL, conjunctiva/corneas clear, EOM's intact,    Ears: Normal  Throat: Normal  Neck: Supple, symmetrical, trachea midline, no adenopathy; thyroid: No enlargement/tenderness/nodules; no carotid bruit or JVD Back: Symmetric, no curvature, ROM normal, no CVA tenderness Lungs: Clear to auscultation bilaterally, respirations unlabored Heart: Regular rate and rhythm, no murmur, rub or gallop Abdomen: Soft, non-tender,, no masses, no organomegaly Extremities: Extremities normal, atraumatic, no cyanosis or edema Pulses: 2+ and symmetric all extremities Skin: Skin color, texture, turgor normal, no rashes or lesions  NEUROLOGIC:   Mental status: alert and oriented, no aphasia, good attention span, Fund of knowledge/ memory ok Motor Exam - grossly normal Sensory Exam - grossly normal Reflexes:  Coordination - grossly normal Gait - grossly normal Balance -  grossly normal Cranial Nerves: I: smell Not tested  II: visual acuity  OS: Normal  OD: Normal   II: visual fields Full to confrontation  II: pupils Equal, round, reactive to light  III,VII: ptosis None  III,IV,VI: extraocular muscles  Full ROM  V: mastication Normal  V: facial light touch sensation  Normal  V,VII: corneal reflex  Present  VII: facial muscle function - upper  Normal  VII: facial muscle function - lower Normal  VIII: hearing Not tested  IX: soft palate elevation  Normal  IX,X: gag reflex Present  XI: trapezius strength  5/5  XI: sternocleidomastoid strength 5/5  XI: neck flexion strength  5/5  XII: tongue strength  Normal    Data Review Lab Results  Component Value Date   WBC 13.2* 10/10/2013   HGB 11.7* 10/10/2013   HCT 34.8* 10/10/2013   MCV 93.8 10/10/2013   PLT 233 10/10/2013   Lab Results  Component Value Date   NA 143 10/10/2013   K 4.4 10/10/2013   CL 101 10/10/2013   CO2 25 10/10/2013   BUN 18 10/10/2013   CREATININE 0.97 10/10/2013   GLUCOSE 108* 10/10/2013   No results found for this basename: INR, PROTIME    Assessment/Plan: 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 I reviewed her MR scan with her. I've pointed out the abnormalities. We have discussed the various treatment options including surgery. I described the surgical treatment option of a left L3-4 discectomy. I have shown her surgical models. We have discussed the risks, benefits, alternatives, and likelihood of achieving our goals with surgery. I have answered all the patient's questions. She has decided to proceed with surgery.   Rajan Burgard D 10/17/2013 7:15 AM

## 2013-10-17 NOTE — Op Note (Signed)
Brief history: The patient is a 68 year old white female who has complained of back and left leg pain consistent with a left L3 radiculopathy. She has failed medical management and was worked up with a lumbar MRI. This demonstrated the patient had a far lateral disc herniation/foraminal stenosis at L3-4 on the left. I discussed the various treatment option with the patient including surgery. She has weighed the risks, benefits, and alternatives surgery and decided proceed with a left L3-4 discectomy/foraminotomy.  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 Earle Gell  Asst.: Dr. Kristeen Miss  Anesthesia: Gen. endotracheal  Estimated blood loss: Minimal  Drains: None  Complications: None  Description of procedure: The patient was brought to the operating room by the anesthesia team. General endotracheal anesthesia was induced. The patient was turned to the prone position on the Wilson frame. The patient's lumbosacral region was then prepared with Betadine scrub and Betadine solution. Sterile drapes were applied.  I then injected the area to be incised with Marcaine with epinephrine solution. I then used a scalpel to make a linear midline incision over the L3-4 intervertebral disc space. I then used electrocautery to perform a left sided subperiosteal dissection exposing the spinous process and lamina of L3-4. We obtained intraoperative radiograph to confirm our location. I then used electrocautery to expose the left L3 pars. I then inserted the [facility] retractor for exposure.  We then brought the operative microscope into the field. Under its magnification and illumination we completed the microdissection. I used a high-speed drill to perform drill off the  lateral aspect of the left L3 pars. I then used a Kerrison punches to remove the overgrown interspinous ligament at L3-4 on the left. We then used microdissection to free up the thecal sac and the exiting left L3 nerve root from the epidural tissue. I then used a Kerrison punch to perform a foraminotomy at about the left L3 nerve root. We then dissected both cephalad and caudal to the nerve root. We identified a more or less spondylitic bulging far lateral disc. We did not see any disc herniation and therefore did not perform a discectomy. We performed a foraminotomy about the left L3 nerve root. I palpably the colon the ventral surface of the nerve root from its intraspinal portion out of the soft tissues and noted it was well decompressed.  We then obtained hemostasis using bipolar electrocautery. We irrigated the wound out with bacitracin solution. We then removed the retractor. We then reapproximated the patient's thoracolumbar fascia with interrupted #1 Vicryl suture. We then reapproximated the patient's subcutaneous tissue with interrupted 2-0 Vicryl suture. We then reapproximated patient's skin with Steri-Strips and benzoin. The was then coated with bacitracin ointment. The drapes were removed. The patient was subsequently returned to the supine position where they were extubated by the anesthesia team. The patient was then transported to the postanesthesia care unit in stable condition. All sponge instrument and needle counts were reportedly correct at the end of this case.

## 2013-10-17 NOTE — Transfer of Care (Signed)
Immediate Anesthesia Transfer of Care Note  Patient: Jordan [name]  Procedure(s) Performed: Procedure(s): LUMBAR LAMINECTOMY/DECOMPRESSION MICRODISCECTOMY 1 LEVEL three/four (Left)  Patient Location: PACU  Anesthesia Type:General  Level of Consciousness: awake, alert  and oriented  Airway & Oxygen Therapy: Patient Spontanous Breathing and Patient connected to face mask oxygen  Post-op Assessment: Report given to PACU RN and Post -op Vital signs reviewed and stable  Post vital signs: Reviewed and stable  Complications: No apparent anesthesia complications

## 2013-10-17 NOTE — Anesthesia Preprocedure Evaluation (Signed)
Anesthesia Evaluation  Patient identified by MRN, date of birth, ID band Patient awake    Reviewed: Allergy & Precautions, H&P , NPO status , Patient's Chart, lab work & pertinent test results  Airway Mallampati: II      Dental   Pulmonary shortness of breath, asthma , pneumonia -, COPD         Cardiovascular hypertension, + Past MI     Neuro/Psych    GI/Hepatic Neg liver ROS, hiatal hernia,   Endo/Other  negative endocrine ROS  Renal/GU negative Renal ROS     Musculoskeletal   Abdominal   Peds  Hematology   Anesthesia Other Findings   Reproductive/Obstetrics                           Anesthesia Physical Anesthesia Plan  ASA: III  Anesthesia Plan: General   Post-op Pain Management:    Induction: Intravenous  Airway Management Planned: Oral ETT  Additional Equipment:   Intra-op Plan:   Post-operative Plan: Possible Post-op intubation/ventilation  Informed Consent: I have reviewed the patients History and Physical, chart, labs and discussed the procedure including the risks, benefits and alternatives for the proposed anesthesia with the patient or authorized representative who has indicated his/her understanding and acceptance.   Dental advisory given  Plan Discussed with: CRNA, Anesthesiologist and Surgeon  Anesthesia Plan Comments:         Anesthesia Quick Evaluation

## 2013-10-17 NOTE — Progress Notes (Signed)
Patient ID: Jordan [name], female   DOB: 05-Dec-1945, 68 y.o.   MRN: [id number] Subjective:  The patient is alert and pleasant. She looks well. She wants to go home tomorrow.  Objective: Vital signs in last 24 hours: Temp:  [97.8 F (36.6 C)-98.3 F (36.8 C)] 98.3 F (36.8 C) (02/09 1626) Pulse Rate:  [73-113] 73 (02/09 1626) Resp:  [9-18] 16 (02/09 1626) BP: (134-191)/(73-113) 142/78 mmHg (02/09 1626) SpO2:  [93 %-99 %] 95 % (02/09 1626)  Intake/Output from previous day:   Intake/Output this shift: Total I/O In: 1480 [P.O.:480; I.V.:1000] Out: 50 [Blood:50]  Physical exam the patient is alert and oriented. Her dressing is clean and dry. Her strength is grossly normal in her lower extremities.  Lab Results: No results found for this basename: WBC, HGB, HCT, PLT,  in the last 72 hours BMET No results found for this basename: NA, K, CL, CO2, GLUCOSE, BUN, CREATININE, CALCIUM,  in the last 72 hours  Studies/Results: Dg Lumbar Spine 2-3 Views  10/17/2013   CLINICAL DATA:  Lumbar laminectomy.  Intraoperative localization.  EXAM: OPERATIVE LUMBAR SPINE - 2-3 VIEW  COMPARISON:  MRI lumbar spine 09/09/2013.  FINDINGS: The prior examination demonstrated 5 non rib-bearing lumbar vertebrae. Current images were submitted for interpretation post operatively.  Initial image at 0810 hr demonstrates the localizer probe between the spinous processes of L3 and L4, directed toward the upper L4 level.  Second image at 0830 hr demonstrates the localizer probe posterior to the lower endplate of L3.  IMPRESSION: L3 localized intraoperatively.   Electronically Signed   By: Evangeline Dakin M.D.   On: 10/17/2013 13:24    Assessment/Plan: The patient is doing well. We'll plan to send her home tomorrow.  LOS: 0 days     Rhema Boyett D 10/17/2013, 6:33 PM

## 2013-10-17 NOTE — Preoperative (Signed)
Beta Blockers   Reason not to administer Beta Blockers:Not Applicable 

## 2013-10-17 NOTE — Anesthesia Postprocedure Evaluation (Signed)
  Anesthesia Post-op Note  Patient: Jordan [name]  Procedure(s) Performed: Procedure(s): LUMBAR LAMINECTOMY/DECOMPRESSION MICRODISCECTOMY 1 LEVEL three/four (Left)  Patient Location: PACU  Anesthesia Type:General  Level of Consciousness: awake  Airway and Oxygen Therapy: Patient Spontanous Breathing  Post-op Pain: mild  Post-op Assessment: Post-op Vital signs reviewed  Post-op Vital Signs: Reviewed  Complications: No apparent anesthesia complications

## 2013-10-17 NOTE — Anesthesia Procedure Notes (Signed)
Procedure Name: Intubation Date/Time: 10/17/2013 7:30 AM Performed by: Octavio Graves Pre-anesthesia Checklist: Patient identified, Timeout performed, Emergency Drugs available, Suction available and Patient being monitored Patient Re-evaluated:Patient Re-evaluated prior to inductionOxygen Delivery Method: Circle system utilized Preoxygenation: Pre-oxygenation with 100% oxygen Intubation Type: IV induction Ventilation: Mask ventilation without difficulty Laryngoscope Size: Miller and 2 Grade View: Grade I Tube type: Oral Tube size: 7.0 mm Number of attempts: 1 Airway Equipment and Method: Stylet Placement Confirmation: ETT inserted through vocal cords under direct vision,  breath sounds checked- equal and bilateral and positive ETCO2 Secured at: 22 cm Tube secured with: Tape Dental Injury: Teeth and Oropharynx as per pre-operative assessment  Comments: IV induction Edwards- intubation AM CRNA atraumatic- many missing teeth prior to the laryngoscopy- teeth and mouth as preop

## 2013-10-18 MED ORDER — DSS 100 MG PO CAPS: 100.0000 mg | ORAL_CAPSULE | Freq: Two times a day (BID) | ORAL | Status: DC

## 2013-10-18 MED ORDER — OXYCODONE-ACETAMINOPHEN 10-325 MG PO TABS: 1.0000 | ORAL_TABLET | ORAL | Status: DC | PRN

## 2013-10-18 MED ORDER — DIAZEPAM 5 MG PO TABS: 5.0000 mg | ORAL_TABLET | Freq: Four times a day (QID) | ORAL | Status: DC | PRN

## 2013-10-18 NOTE — Progress Notes (Signed)
Pt doing well. Pt given D/C instructions with Rx's, verbal understanding was given. Pt D/C'd home via wheelchair @ 1130 per MD order. Pt was stable @ D/C and had no other needs. Holli Humbles, RN

## 2013-10-18 NOTE — Discharge Summary (Signed)
Physician Discharge Summary  Patient ID: Jordan [name] MRN: [id number] DOB/AGE: 1946/08/29 68 y.o.  Admit date: 10/17/2013 Discharge date: 10/18/2013  Admission Diagnoses: Left L3-4 foraminal stenosis, herniated disc, lumbago, lumbar radiculopathy  Discharge Diagnoses: The same Active Problems:   Lumbar foraminal stenosis   Discharged Condition: good  Hospital Course: I performed a left L3-4 far lateral laminotomy foraminotomy on the patient on 10/17/2013. The surgery went well.  The patient's postoperative course was unremarkable. On postop day #1 the patient's leg pain was gone and she requested discharge to home. She was given oral and written discharge instructions. All her questions were answered.  Consults: None Significant Diagnostic Studies: None Treatments: Left L3-4 far lateral laminotomy foraminotomy Discharge Exam: Blood pressure 138/91, pulse 88, temperature 98.9 F (37.2 C), temperature source Oral, resp. rate 16, SpO2 93.00%. Patient is alert and pleasant. She looks well. Her dressing is clean and dry. Her strength is normal in her lower extremities.  Disposition: Home  Discharge Orders   Future Appointments Provider Department Dept Phone   11/15/2013 3:00 PM Jordan Dolin, MD [facility] [phone]   Future Orders Complete By Expires   Call MD for:  difficulty breathing, headache or visual disturbances  As directed    Call MD for:  extreme fatigue  As directed    Call MD for:  hives  As directed    Call MD for:  persistant dizziness or light-headedness  As directed    Call MD for:  persistant nausea and vomiting  As directed    Call MD for:  redness, tenderness, or signs of infection (pain, swelling, redness, odor or green/yellow discharge around incision site)  As directed    Call MD for:  severe uncontrolled pain  As directed    Call MD for:  temperature >100.4  As directed    Diet - low sodium heart healthy  As directed     Discharge instructions  As directed    Comments:     Call [phone] for a followup appointment. Take a stool softener while you are using pain medications.   Driving Restrictions  As directed    Comments:     Do not drive for 2 weeks.   Increase activity slowly  As directed    Lifting restrictions  As directed    Comments:     Do not lift more than 5 pounds. No excessive bending or twisting.   May shower / Bathe  As directed    Comments:     He may shower after the pain she is removed 3 days after surgery. Leave the incision alone.   Remove dressing in 48 hours  As directed    Comments:     Your stitches are under the scan and will dissolve by themselves. The Steri-Strips will fall off after you take a few showers. Do not rub back or pick at the wound, Leave the wound alone.       Medication List    STOP taking these medications       HYDROcodone-acetaminophen 5-325 MG per tablet  Commonly known as:  NORCO/VICODIN     tiZANidine 4 MG tablet  Commonly known as:  ZANAFLEX      TAKE these medications       albuterol 108 (90 BASE) MCG/ACT inhaler  Commonly known as:  PROVENTIL HFA;VENTOLIN HFA  Inhale 2 puffs into the lungs every 6 (six) hours as needed for wheezing.     alendronate 70 MG  tablet  Commonly known as:  FOSAMAX  Take 70 mg by mouth every Thursday. Take with a full glass of water on an empty stomach.     diazepam 5 MG tablet  Commonly known as:  VALIUM  Take 1 tablet (5 mg total) by mouth every 6 (six) hours as needed for muscle spasms.     DSS 100 MG Caps  Take 100 mg by mouth 2 (two) times daily.     FLUoxetine 40 MG capsule  Commonly known as:  PROZAC  Take 40 mg by mouth daily.     Fluticasone-Salmeterol 500-50 MCG/DOSE Aepb  Commonly known as:  ADVAIR  Inhale 1 puff into the lungs 2 (two) times daily.     montelukast 10 MG tablet  Commonly known as:  SINGULAIR  Take 10 mg by mouth at bedtime.     oxyCODONE-acetaminophen 10-325 MG per tablet   Commonly known as:  PERCOCET  Take 1 tablet by mouth every 4 (four) hours as needed for pain.     predniSONE 10 MG tablet  Commonly known as:  DELTASONE  Take 10 mg by mouth daily.     PROBIOTIC PO  Take 1 tablet by mouth every morning.     tiotropium 18 MCG inhalation capsule  Commonly known as:  SPIRIVA  Place 18 mcg into inhaler and inhale daily.     verapamil 120 MG CR tablet  Commonly known as:  CALAN-SR  Take 120 mg by mouth at bedtime.     Vitamin D-3 5000 UNITS Tabs  Take 5,000 Units by mouth daily.         SignedOphelia [name] 10/18/2013, 7:46 AM

## 2013-10-20 ENCOUNTER — Encounter (HOSPITAL_COMMUNITY): Payer: Self-pay | Admitting: Neurosurgery

## 2013-10-21 ENCOUNTER — Encounter: Payer: Medicare Other | Admitting: Internal Medicine

## 2013-11-15 ENCOUNTER — Encounter: Payer: Self-pay | Admitting: Internal Medicine

## 2013-11-15 ENCOUNTER — Ambulatory Visit (INDEPENDENT_AMBULATORY_CARE_PROVIDER_SITE_OTHER): Payer: Medicare Other | Admitting: Internal Medicine

## 2013-11-15 VITALS — BP 184/88 | HR 89 | Temp 98.3°F | Wt 140.8 lb

## 2013-11-15 DIAGNOSIS — K648 Other hemorrhoids: Secondary | ICD-10-CM

## 2013-11-15 NOTE — Patient Instructions (Signed)
Avoid straining.  Continue fiber supplement daily  Limit toilet time to 2-3 minutes  Call with any interim problems  Schedule followup appointment in 2-3 weeks from now

## 2013-11-15 NOTE — Progress Notes (Signed)
[facility] banding procedure note:  Status post banding of right anterior hemorrhoid in December of last year. Bleeding has essentially ceased. She has done well. She wishes to continue with banding.  All risks, benefits, and alternative forms of therapy were described and informed consent was obtained.  In the left lateral decubitus position, a digital rectal exam was performed. The decision was made to band the left lateral internal hemorrhoid;  the Conrad was used to perform band ligation without complication. Digital anorectal examination was then performed after band placement. Band in excellent position. No pinching or pain.   No complications were encountered and the patient tolerated the procedure well.

## 2013-11-18 ENCOUNTER — Encounter: Payer: Medicare Other | Admitting: Internal Medicine

## 2013-12-27 ENCOUNTER — Encounter: Payer: Self-pay | Admitting: Internal Medicine

## 2013-12-27 ENCOUNTER — Ambulatory Visit (INDEPENDENT_AMBULATORY_CARE_PROVIDER_SITE_OTHER): Payer: Medicare Other | Admitting: Internal Medicine

## 2013-12-27 VITALS — BP 133/79 | HR 84 | Temp 98.4°F | Ht 63.0 in | Wt 142.8 lb

## 2013-12-27 DIAGNOSIS — K648 Other hemorrhoids: Secondary | ICD-10-CM

## 2013-12-27 NOTE — Patient Instructions (Signed)
Avoid straining.  Benefiber 2 teaspoons twice daily or similar fiber supplement  Limit toilet time to 2-3 minutes  Call with any interim problems  Schedule followup appointment in 6 months

## 2013-12-27 NOTE — Progress Notes (Signed)
[facility] banding procedure note:  Status post prior banding of the right anterior and left lateral hemorrhoid column. Patient returns for third banding session today. States that all of her hemorrhoidal symptoms have globally improved. She is pleased and wishes to have the third band placed today All risks, benefits, and alternative forms of therapy were described and informed consent was obtained.  In the left lateral decubitus position, a digital rectal exam was performed which revealed no abnormalities.  The decision was made to band the right posterior internal hemorrhoid;the [place] was used to perform band ligation without complication. Digital anorectal examination was then performed to assure proper positioning of the band;  band found to be in excellent position; no pinching or pain. The patient was discharged home without pain or other issues. Dietary and behavioral recommendations were given. The patient will return in 6 months for followup.  No complications were encountered and the patient tolerated the procedure well.

## 2013-12-29 ENCOUNTER — Ambulatory Visit (INDEPENDENT_AMBULATORY_CARE_PROVIDER_SITE_OTHER): Payer: Medicare Other | Admitting: Orthopedic Surgery

## 2013-12-29 ENCOUNTER — Encounter: Payer: Self-pay | Admitting: Orthopedic Surgery

## 2013-12-29 VITALS — BP 183/86 | Ht 63.0 in | Wt 135.0 lb

## 2013-12-29 DIAGNOSIS — M7022 Olecranon bursitis, left elbow: Secondary | ICD-10-CM

## 2013-12-29 DIAGNOSIS — M7020 Olecranon bursitis, unspecified elbow: Secondary | ICD-10-CM

## 2013-12-29 DIAGNOSIS — M25529 Pain in unspecified elbow: Secondary | ICD-10-CM

## 2013-12-29 DIAGNOSIS — M25522 Pain in left elbow: Secondary | ICD-10-CM

## 2013-12-29 NOTE — Patient Instructions (Signed)
Schedule for Left elbow bursectomy.

## 2013-12-29 NOTE — Progress Notes (Signed)
Patient ID: Jordan [name], female   DOB: 03-30-1946, 68 y.o.   MRN: [id number]  Chief Complaint  Patient presents with  . Follow-up    left elbow pain   HISTORY: This is a 68 year old female with a history of draining bursal sac left elbow treated with drainage by aspiration. She presents back with continued swelling and drainage of clear fluid without erythema.  She denies fever or chills  Exam shows a point area of drainage of clear fluid from the left elbow with a 3 cm x 2 cm bursal sac which is spongy to touch. Her elbow range of motion remains normal. Ligaments of the elbow are stable. Extension and flexion were normal. Skin as described. Pulses are good sensation is normal lymph nodes are negative  BP 183/86  Ht 5\' 3"  (1.6 m)  Wt 135 lb (61.236 kg)  BMI 23.92 kg/m2 General appearance is normal, the patient is alert and oriented x3 with normal mood and affect. Ambulation is not affected  Impression left elbow bursitis  Plan left elbow bursectomy

## 2013-12-30 ENCOUNTER — Other Ambulatory Visit: Payer: Self-pay | Admitting: *Deleted

## 2014-01-02 ENCOUNTER — Telehealth: Payer: Self-pay | Admitting: Orthopedic Surgery

## 2014-01-02 NOTE — Telephone Encounter (Signed)
Regarding out-patient surgery scheduled at [facility], 01/11/14, CPT [phone], contacted insurers, primary, [facility], ph# [phone], fax# [phone], for pre-authorization requirement information; reached voice message, sent fax request.  Secondary insurer, [employer], [place], reached automated voice message requesting to call back prior to 4:30pm EST.

## 2014-01-03 ENCOUNTER — Encounter (HOSPITAL_COMMUNITY): Payer: Self-pay | Admitting: Pharmacy Technician

## 2014-01-05 ENCOUNTER — Ambulatory Visit: Payer: Medicare Other | Admitting: Orthopedic Surgery

## 2014-01-06 ENCOUNTER — Encounter (HOSPITAL_COMMUNITY): Payer: Self-pay

## 2014-01-06 ENCOUNTER — Encounter (HOSPITAL_COMMUNITY)
Admission: RE | Admit: 2014-01-06 | Discharge: 2014-01-06 | Disposition: A | Discharge status: Home / Self Care | Payer: Medicare Other | Source: Ambulatory Visit | Attending: Orthopedic Surgery | Admitting: Orthopedic Surgery

## 2014-01-06 DIAGNOSIS — Z01812 Encounter for preprocedural laboratory examination: Secondary | ICD-10-CM | POA: Insufficient documentation

## 2014-01-06 LAB — BASIC METABOLIC PANEL
BUN: 19 mg/dL (ref 6–23)
CO2: 30 mEq/L (ref 19–32)
Calcium: 10.3 mg/dL (ref 8.4–10.5)
Chloride: 103 mEq/L (ref 96–112)
Creatinine, Ser: 1.2 mg/dL — ABNORMAL HIGH (ref 0.50–1.10)
GFR calc Af Amer: 53 mL/min — ABNORMAL LOW (ref >90)
GFR calc non Af Amer: 45 mL/min — ABNORMAL LOW (ref >90)
Glucose, Bld: 112 mg/dL — ABNORMAL HIGH (ref 70–99)
Potassium: 4.9 mEq/L (ref 3.7–5.3)
Sodium: 144 mEq/L (ref 137–147)

## 2014-01-06 LAB — HEMOGLOBIN AND HEMATOCRIT, BLOOD
HCT: 36.8 % (ref 36.0–46.0)
Hemoglobin: 12.2 g/dL (ref 12.0–15.0)

## 2014-01-06 NOTE — Patient Instructions (Signed)
Jordan [name]  01/06/2014   Your procedure is scheduled on:  01/11/2014  Report to [facility] at  1130  AM.  Call this number if you have problems the morning of surgery: [phone]   Remember:   Do not eat food or drink liquids after midnight.   Take these medicines the morning of surgery with A SIP OF WATER:  Diazepam, prozac, hydrocodone, singulair, prednisone, verapamil. Take proventil, advair and spiriva before you come.   Do not wear jewelry, make-up or nail polish.  Do not wear lotions, powders, or perfumes.   Do not shave 48 hours prior to surgery. Men may shave face and neck.  Do not bring valuables to the hospital.  [facility] is not responsible for any belongings or valuables.               Contacts, dentures or bridgework may not be worn into surgery.  Leave suitcase in the car. After surgery it may be brought to your room.  For patients admitted to the hospital, discharge time is determined by your treatment team.               Patients discharged the day of surgery will not be allowed to drive home.  Name and phone number of your driver: family  Special Instructions: Shower using CHG 2 nights before surgery and the night before surgery.  If you shower the day of surgery use CHG.  Use special wash - you have one bottle of CHG for all showers.  You should use approximately 1/3 of the bottle for each shower.   Please read over the following fact sheets that you were given: Pain Booklet, Coughing and Deep Breathing, Surgical Site Infection Prevention, Anesthesia Post-op Instructions and Care and Recovery After Surgery Olecranon Bursitis Bursitis is swelling and soreness (inflammation) of a fluid-filled sac (bursa) that covers and protects a joint. Olecranon bursitis occurs over the elbow.  CAUSES Bursitis can be caused by injury, overuse of the joint, arthritis, or infection.  SYMPTOMS   Tenderness, swelling, warmth, or redness over the elbow.  Elbow pain with  movement. This is greater with bending the elbow.  Squeaking sound when the bursa is rubbed or moved.  Increasing size of the bursa without pain or discomfort.  Fever with increasing pain and swelling if the bursa becomes infected. HOME CARE INSTRUCTIONS   Put ice on the affected area.  Put ice in a plastic bag.  Place a towel between your skin and the bag.  Leave the ice on for 15-20 minutes each hour while awake. Do this for the first 2 days.  When resting, elevate your elbow above the level of your heart. This helps reduce swelling.  Continue to put the joint through a full range of motion 4 times per day. Rest the injured joint at other times. When the pain lessens, begin normal slow movements and usual activities.  Only take over-the-counter or prescription medicines for pain, discomfort, or fever as directed by your caregiver.  Reduce your intake of milk and related dairy products (cheese, yogurt). They may make your condition worse. SEEK IMMEDIATE MEDICAL CARE IF:   Your pain increases even during treatment.  You have a fever.  You have heat and inflammation over the bursa and elbow.  You have a red line that goes up your arm.  You have pain with movement of your elbow. MAKE SURE YOU:   Understand these instructions.  Will watch your  condition.  Will get help right away if you are not doing well or get worse. Document Released: 09/24/2006 Document Revised: 11/17/2011 Document Reviewed: 08/10/2007 [facility] Patient Information 2014 [place], [place]. PATIENT INSTRUCTIONS POST-ANESTHESIA  IMMEDIATELY FOLLOWING SURGERY:  Do not drive or operate machinery for the first twenty four hours after surgery.  Do not make any important decisions for twenty four hours after surgery or while taking narcotic pain medications or sedatives.  If you develop intractable nausea and vomiting or a severe headache please notify your doctor immediately.  FOLLOW-UP:  Please make an  appointment with your surgeon as instructed. You do not need to follow up with anesthesia unless specifically instructed to do so.  WOUND CARE INSTRUCTIONS (if applicable):  Keep a dry clean dressing on the anesthesia/puncture wound site if there is drainage.  Once the wound has quit draining you may leave it open to air.  Generally you should leave the bandage intact for twenty four hours unless there is drainage.  If the epidural site drains for more than 36-48 hours please call the anesthesia department.  QUESTIONS?:  Please feel free to call your physician or the hospital operator if you have any questions, and they will be happy to assist you.

## 2014-01-10 NOTE — H&P (Signed)
  Chief complaint left elbow swelling  Originally Consult request Dr. Luan Pulling  The patient reports 4 week history of swelling and pain over the left elbow drained twice by the primary care physician. Complains of burning 7/10 constant pain  over the left elbow associated with heat. No fever. Does complain of fatigue  Shortness of breath and wheezing on review of systems history diarrhea and hemorrhoids. Urgency. Joint pain unsteady gait depression easy bruising excessive thirst adverse reactions to food  Note patient had 2 knee surgeries would like her right knee evaluated on her next visit  History of asthma osteoporosis hypertension  History of tonsillectomy appendectomy history of GERD cholecystectomy carpal tunnel release neck fusion  Current medications include prednisone Prozac Singulair post max Advair spur Riva rapid female pro-air vitamin D probiotic  Family history heart disease, arthritis, asthma. Social history retired divorced does not smoke occasional alcohol use.  BP 141/75  Ht 5\' 3"  (1.6 m)  Wt 135 lb (61.236 kg)  BMI 23.92 kg/m2  General appearance is normal, the patient is alert and oriented x3 with normal mood and affect. Body habitus endomorphic  There is a large amount of swelling of the left elbow consistent with bursitis range of motion in the elbow is normal stability test normal motor exam normal skin slightly red good pulse at the radial artery normal sensation and no lymph nodes are palpable  X-ray shows soft tissue swelling  Left elbow bursitis refractory to aspiration  Patient presents for left elbow bursectomy

## 2014-01-11 ENCOUNTER — Ambulatory Visit (HOSPITAL_COMMUNITY): Payer: Medicare Other | Admitting: Anesthesiology

## 2014-01-11 ENCOUNTER — Encounter (HOSPITAL_COMMUNITY): Payer: Self-pay | Admitting: *Deleted

## 2014-01-11 ENCOUNTER — Ambulatory Visit (HOSPITAL_COMMUNITY)
Admission: RE | Admit: 2014-01-11 | Discharge: 2014-01-11 | Disposition: A | Discharge status: Home / Self Care | Payer: Medicare Other | Source: Ambulatory Visit | Attending: Orthopedic Surgery | Admitting: Orthopedic Surgery

## 2014-01-11 ENCOUNTER — Encounter (HOSPITAL_COMMUNITY): Payer: Medicare Other | Admitting: Anesthesiology

## 2014-01-11 ENCOUNTER — Encounter (HOSPITAL_COMMUNITY)
Admission: RE | Disposition: A | Discharge status: Home / Self Care | Payer: Self-pay | Source: Ambulatory Visit | Attending: Orthopedic Surgery

## 2014-01-11 DIAGNOSIS — I252 Old myocardial infarction: Secondary | ICD-10-CM | POA: Insufficient documentation

## 2014-01-11 DIAGNOSIS — M6740 Ganglion, unspecified site: Secondary | ICD-10-CM | POA: Insufficient documentation

## 2014-01-11 DIAGNOSIS — Z79899 Other long term (current) drug therapy: Secondary | ICD-10-CM | POA: Insufficient documentation

## 2014-01-11 DIAGNOSIS — Z8701 Personal history of pneumonia (recurrent): Secondary | ICD-10-CM | POA: Insufficient documentation

## 2014-01-11 DIAGNOSIS — I10 Essential (primary) hypertension: Secondary | ICD-10-CM | POA: Insufficient documentation

## 2014-01-11 DIAGNOSIS — K449 Diaphragmatic hernia without obstruction or gangrene: Secondary | ICD-10-CM | POA: Insufficient documentation

## 2014-01-11 DIAGNOSIS — J4489 Other specified chronic obstructive pulmonary disease: Secondary | ICD-10-CM | POA: Insufficient documentation

## 2014-01-11 DIAGNOSIS — M7020 Olecranon bursitis, unspecified elbow: Secondary | ICD-10-CM

## 2014-01-11 DIAGNOSIS — F329 Major depressive disorder, single episode, unspecified: Secondary | ICD-10-CM | POA: Insufficient documentation

## 2014-01-11 DIAGNOSIS — F3289 Other specified depressive episodes: Secondary | ICD-10-CM | POA: Insufficient documentation

## 2014-01-11 DIAGNOSIS — J449 Chronic obstructive pulmonary disease, unspecified: Secondary | ICD-10-CM | POA: Insufficient documentation

## 2014-01-11 DIAGNOSIS — M7022 Olecranon bursitis, left elbow: Secondary | ICD-10-CM

## 2014-01-11 DIAGNOSIS — M810 Age-related osteoporosis without current pathological fracture: Secondary | ICD-10-CM | POA: Insufficient documentation

## 2014-01-11 DIAGNOSIS — IMO0002 Reserved for concepts with insufficient information to code with codable children: Secondary | ICD-10-CM | POA: Insufficient documentation

## 2014-01-11 HISTORY — PX: OLECRANON BURSECTOMY: SHX2097

## 2014-01-11 SURGERY — BURSECTOMY, ELBOW
Anesthesia: Monitor Anesthesia Care | Site: Elbow | Laterality: Left

## 2014-01-11 MED ORDER — FENTANYL CITRATE 0.05 MG/ML IJ SOLN
INTRAMUSCULAR | Status: AC
  Filled 2014-01-11: qty 2

## 2014-01-11 MED ORDER — ONDANSETRON HCL 4 MG/2ML IJ SOLN: 4.0000 mg | Freq: Once | INTRAMUSCULAR | Status: DC | PRN

## 2014-01-11 MED ORDER — BUPIVACAINE-EPINEPHRINE (PF) 0.5% -1:200000 IJ SOLN
INTRAMUSCULAR | Status: AC
  Filled 2014-01-11: qty 30

## 2014-01-11 MED ORDER — MIDAZOLAM HCL 2 MG/2ML IJ SOLN
INTRAMUSCULAR | Status: AC
  Filled 2014-01-11: qty 2

## 2014-01-11 MED ORDER — LIDOCAINE HCL (PF) 0.5 % IJ SOLN
INTRAMUSCULAR | Status: DC | PRN
  Administered 2014-01-11: 250 mg via INTRAVENOUS

## 2014-01-11 MED ORDER — CEFAZOLIN SODIUM-DEXTROSE 2-3 GM-% IV SOLR
INTRAVENOUS | Status: AC
  Filled 2014-01-11: qty 50

## 2014-01-11 MED ORDER — PROPOFOL 10 MG/ML IV BOLUS
INTRAVENOUS | Status: AC
  Filled 2014-01-11: qty 20

## 2014-01-11 MED ORDER — CHLORHEXIDINE GLUCONATE 4 % EX LIQD: 60.0000 mL | Freq: Once | CUTANEOUS | Status: DC

## 2014-01-11 MED ORDER — LACTATED RINGERS IV SOLN
INTRAVENOUS | Status: DC
  Administered 2014-01-11: 1000 mL via INTRAVENOUS

## 2014-01-11 MED ORDER — CEFAZOLIN SODIUM-DEXTROSE 2-3 GM-% IV SOLR
2.0000 g | INTRAVENOUS | Status: AC
  Administered 2014-01-11: 2 g via INTRAVENOUS

## 2014-01-11 MED ORDER — PROPOFOL INFUSION 10 MG/ML OPTIME
INTRAVENOUS | Status: DC | PRN
  Administered 2014-01-11: 14:00:00 via INTRAVENOUS
  Administered 2014-01-11: 40 ug/kg/min via INTRAVENOUS

## 2014-01-11 MED ORDER — EPHEDRINE SULFATE 50 MG/ML IJ SOLN
INTRAMUSCULAR | Status: AC
  Filled 2014-01-11: qty 1

## 2014-01-11 MED ORDER — ONDANSETRON HCL 4 MG/2ML IJ SOLN
INTRAMUSCULAR | Status: AC
  Filled 2014-01-11: qty 2

## 2014-01-11 MED ORDER — FENTANYL CITRATE 0.05 MG/ML IJ SOLN: 25.0000 ug | INTRAMUSCULAR | Status: DC | PRN

## 2014-01-11 MED ORDER — HYDROCODONE-ACETAMINOPHEN 5-325 MG PO TABS: 1.0000 | ORAL_TABLET | ORAL | Status: DC | PRN

## 2014-01-11 MED ORDER — FENTANYL CITRATE 0.05 MG/ML IJ SOLN
25.0000 ug | INTRAMUSCULAR | Status: AC
  Administered 2014-01-11 (×2): 25 ug via INTRAVENOUS

## 2014-01-11 MED ORDER — ONDANSETRON HCL 4 MG/2ML IJ SOLN
4.0000 mg | Freq: Once | INTRAMUSCULAR | Status: AC
  Administered 2014-01-11: 4 mg via INTRAVENOUS

## 2014-01-11 MED ORDER — MIDAZOLAM HCL 2 MG/2ML IJ SOLN
1.0000 mg | INTRAMUSCULAR | Status: DC | PRN
  Administered 2014-01-11 (×2): 2 mg via INTRAVENOUS

## 2014-01-11 MED ORDER — SODIUM CHLORIDE 0.9 % IR SOLN
Status: DC | PRN
  Administered 2014-01-11: 1000 mL

## 2014-01-11 MED ORDER — BUPIVACAINE-EPINEPHRINE 0.5% -1:200000 IJ SOLN
INTRAMUSCULAR | Status: DC | PRN
  Administered 2014-01-11: 10 mL

## 2014-01-11 MED ORDER — SODIUM CHLORIDE 0.9 % IJ SOLN
INTRAMUSCULAR | Status: AC
  Filled 2014-01-11: qty 10

## 2014-01-11 MED ORDER — FENTANYL CITRATE 0.05 MG/ML IJ SOLN
INTRAMUSCULAR | Status: DC | PRN
  Administered 2014-01-11 (×2): 25 ug via INTRAVENOUS

## 2014-01-11 MED ORDER — LIDOCAINE HCL (PF) 0.5 % IJ SOLN
INTRAMUSCULAR | Status: AC
  Filled 2014-01-11: qty 50

## 2014-01-11 MED ORDER — MIDAZOLAM HCL 5 MG/5ML IJ SOLN
INTRAMUSCULAR | Status: DC | PRN
  Administered 2014-01-11: 2 mg via INTRAVENOUS

## 2014-01-11 SURGICAL SUPPLY — 55 items
BAG HAMPER (MISCELLANEOUS) ×2 IMPLANT
BANDAGE ELASTIC 4 VELCRO NS (GAUZE/BANDAGES/DRESSINGS) IMPLANT
BANDAGE ELASTIC 6 VELCRO NS (GAUZE/BANDAGES/DRESSINGS) IMPLANT
BANDAGE ESMARK 4X12 BL STRL LF (DISPOSABLE) ×1 IMPLANT
BANDAGE GAUZE ELAST BULKY 4 IN (GAUZE/BANDAGES/DRESSINGS) ×2 IMPLANT
BIT DRILL 2.0MX128MM (BIT) ×2 IMPLANT
BLADE SURG 15 STRL LF DISP TIS (BLADE) ×1 IMPLANT
BLADE SURG 15 STRL SS (BLADE) ×1
BNDG COHESIVE 4X5 TAN STRL (GAUZE/BANDAGES/DRESSINGS) IMPLANT
BNDG ESMARK 4X12 BLUE STRL LF (DISPOSABLE) ×2
CHLORAPREP W/TINT 26ML (MISCELLANEOUS) ×2 IMPLANT
CLOTH BEACON ORANGE TIMEOUT ST (SAFETY) ×2 IMPLANT
COVER LIGHT HANDLE STERIS (MISCELLANEOUS) IMPLANT
COVER MAYO STAND XLG (DRAPE) ×2 IMPLANT
COVER SURGICAL LIGHT HANDLE (MISCELLANEOUS) ×4 IMPLANT
CUFF TOURNIQUET SINGLE 18IN (TOURNIQUET CUFF) ×2 IMPLANT
DECANTER SPIKE VIAL GLASS SM (MISCELLANEOUS) ×2 IMPLANT
DRAPE PROXIMA HALF (DRAPES) ×2 IMPLANT
ELECT REM PT RETURN 9FT ADLT (ELECTROSURGICAL) ×2
ELECTRODE REM PT RTRN 9FT ADLT (ELECTROSURGICAL) ×1 IMPLANT
GAUZE KERLIX 2X3 DERM STRL LF (GAUZE/BANDAGES/DRESSINGS) IMPLANT
GAUZE XEROFORM 5X9 LF (GAUZE/BANDAGES/DRESSINGS) ×2 IMPLANT
GLOVE BIOGEL PI IND STRL 7.0 (GLOVE) ×2 IMPLANT
GLOVE BIOGEL PI IND STRL 7.5 (GLOVE) ×1 IMPLANT
GLOVE BIOGEL PI INDICATOR 7.0 (GLOVE) ×2
GLOVE BIOGEL PI INDICATOR 7.5 (GLOVE) ×1
GLOVE SKINSENSE NS SZ8.0 LF (GLOVE) ×1
GLOVE SKINSENSE STRL SZ8.0 LF (GLOVE) ×1 IMPLANT
GLOVE SS N UNI LF 8.5 STRL (GLOVE) ×2 IMPLANT
GOWN STRL REUS W/TWL LRG LVL3 (GOWN DISPOSABLE) ×6 IMPLANT
GOWN STRL REUS W/TWL XL LVL3 (GOWN DISPOSABLE) ×2 IMPLANT
INST SET MINOR BONE (KITS) ×2 IMPLANT
KIT ROOM TURNOVER APOR (KITS) ×2 IMPLANT
MANIFOLD NEPTUNE II (INSTRUMENTS) ×2 IMPLANT
NEEDLE HYPO 21X1.5 SAFETY (NEEDLE) ×2 IMPLANT
NS IRRIG 1000ML POUR BTL (IV SOLUTION) ×2 IMPLANT
PACK BASIC LIMB (CUSTOM PROCEDURE TRAY) ×2 IMPLANT
PAD ABD 5X9 TENDERSORB (GAUZE/BANDAGES/DRESSINGS) IMPLANT
PAD ARMBOARD 7.5X6 YLW CONV (MISCELLANEOUS) ×2 IMPLANT
PAD CAST 4YDX4 CTTN HI CHSV (CAST SUPPLIES) IMPLANT
PADDING CAST COTTON 4X4 STRL (CAST SUPPLIES)
SET BASIN LINEN APH (SET/KITS/TRAYS/PACK) ×2 IMPLANT
SLING ARM FOAM STRAP MED (SOFTGOODS) ×2 IMPLANT
SLING ARM FOAM STRAP XLG (SOFTGOODS) IMPLANT
SLING ARM LRG ADULT FOAM STRAP (SOFTGOODS) IMPLANT
SPONGE GAUZE 4X4 12PLY (GAUZE/BANDAGES/DRESSINGS) ×2 IMPLANT
SPONGE LAP 18X18 X RAY DECT (DISPOSABLE) ×2 IMPLANT
STAPLER VISISTAT 35W (STAPLE) ×2 IMPLANT
SUT ETHILON 3 0 FSL (SUTURE) ×2 IMPLANT
SUT MON AB 2-0 SH 27 (SUTURE)
SUT MON AB 2-0 SH27 (SUTURE) IMPLANT
SUT VIC AB 1 CT1 27 (SUTURE)
SUT VIC AB 1 CT1 27XBRD ANTBC (SUTURE) IMPLANT
SYR BULB IRRIGATION 50ML (SYRINGE) ×2 IMPLANT
SYR CONTROL 10ML LL (SYRINGE) ×2 IMPLANT

## 2014-01-11 NOTE — Brief Op Note (Signed)
01/11/2014  2:32 PM  PATIENT:  Jordan [name]  68 y.o. female  PRE-OPERATIVE DIAGNOSIS:  Olecranon bursitis of left elbow  POST-OPERATIVE DIAGNOSIS:  Olecranon bursitis of left elbow  PROCEDURE:  Procedure(s): OLECRANON BURSECTOMY (Left)  SURGEON:  Surgeon(s) and Role:    * Carole Civil, MD - Primary  PHYSICIAN ASSISTANT:   ASSISTANTS: debbie dallas   ANESTHESIA:   regional  EBL:  Total I/O In: 700 [I.V.:700] Out: -   BLOOD ADMINISTERED:none  DRAINS: none   LOCAL MEDICATIONS USED:  MARCAINE     SPECIMEN:  Source of Specimen:  left elbow firm white mass with possible tophaceous material ?  DISPOSITION OF SPECIMEN:  PATHOLOGY  COUNTS:  YES  TOURNIQUET:   Total Tourniquet Time Documented: Upper Arm (Left) - 40 minutes Total: Upper Arm (Left) - 40 minutes   DICTATION: .Viviann Spare Dictation  PLAN OF CARE: Discharge to home after PACU  PATIENT DISPOSITION:  PACU - hemodynamically stable.   Delay start of Pharmacological VTE agent (>24hrs) due to surgical blood loss or risk of bleeding: not applicable  Details of procedure: The patient was evaluated in the preoperative area and site confirmation was confirmed and marked along with chart review. The patient was taken to the operating room for regional anesthesia which was performed by a Bier block. She was placed supine. The left arm was prepped and draped from hand tags along. Sterile draping was completed and the timeout was confirmed and completed.  The tourniquet from the Bier block was used to exsanguinate the limb.  We made a longitudinal incision. There was a puncture wound from inside out with clear drainage  leading [place] to classify the wound is very infected although there was no purulent material. The drainage was clear to yellow and of thin consistency.  The puncture wound was excised from the wound.  Blunt and sharp dissection was used to remove the mass into gross sections.  The wound was then  irrigated and closed with 2-0 Monocryl in interrupted fashion with wound approximation by staples. 10 cc of Marcaine was injected around the wound to assist with postoperative anesthesia.

## 2014-01-11 NOTE — Anesthesia Postprocedure Evaluation (Signed)
  Anesthesia Post-op Note  Patient: Jordan [name]  Procedure(s) Performed: Procedure(s): OLECRANON BURSECTOMY (Left)  Patient Location: PACU  Anesthesia Type:Bier block  Level of Consciousness: awake, alert , oriented and patient cooperative  Airway and Oxygen Therapy: Patient Spontanous Breathing  Post-op Pain: 3 /10, mild  Post-op Assessment: Post-op Vital signs reviewed, Patient's Cardiovascular Status Stable, Respiratory Function Stable, Patent Airway and Pain level controlled  Post-op Vital Signs: Reviewed and stable  Last Vitals:  Filed Vitals:   01/11/14 1315  BP: 164/86  Resp: 15    Complications: No apparent anesthesia complications

## 2014-01-11 NOTE — Op Note (Signed)
01/11/2014  2:32 PM  PATIENT:  Jordan [name]  68 y.o. female  PRE-OPERATIVE DIAGNOSIS:  Olecranon bursitis of left elbow  POST-OPERATIVE DIAGNOSIS:  Olecranon bursitis of left elbow  PROCEDURE:  Procedure(s): OLECRANON BURSECTOMY (Left)  SURGEON:  Surgeon(s) and Role:    * Carole Civil, MD - Primary  PHYSICIAN ASSISTANT:   ASSISTANTS: debbie dallas   ANESTHESIA:   regional  EBL:  Total I/O In: 700 [I.V.:700] Out: -   BLOOD ADMINISTERED:none  DRAINS: none   LOCAL MEDICATIONS USED:  MARCAINE     SPECIMEN:  Source of Specimen:  left elbow firm white mass with possible tophaceous material ?  DISPOSITION OF SPECIMEN:  PATHOLOGY  COUNTS:  YES  TOURNIQUET:   Total Tourniquet Time Documented: Upper Arm (Left) - 40 minutes Total: Upper Arm (Left) - 40 minutes   DICTATION: .Viviann Spare Dictation  PLAN OF CARE: Discharge to home after PACU  PATIENT DISPOSITION:  PACU - hemodynamically stable.   Delay start of Pharmacological VTE agent (>24hrs) due to surgical blood loss or risk of bleeding: not applicable  Details of procedure: The patient was evaluated in the preoperative area and site confirmation was confirmed and marked along with chart review. The patient was taken to the operating room for regional anesthesia which was performed by a Bier block. She was placed supine. The left arm was prepped and draped from hand tags along. Sterile draping was completed and the timeout was confirmed and completed.  The tourniquet from the Bier block was used to exsanguinate the limb.  We made a longitudinal incision. There was a puncture wound from inside out with clear drainage  leading [place] to classify the wound is very infected although there was no purulent material. The drainage was clear to yellow and of thin consistency.  The puncture wound was excised from the wound.  Blunt and sharp dissection was used to remove the mass into gross sections.  The wound was then  irrigated and closed with 2-0 Monocryl in interrupted fashion with wound approximation by staples. 10 cc of Marcaine was injected around the wound to assist with postoperative anesthesia.

## 2014-01-11 NOTE — Interval H&P Note (Signed)
History and Physical Interval Note:  01/11/2014 12:54 PM  Jordan [name]  has presented today for surgery, with the diagnosis of Olecranon bursitis of left elbow  The various methods of treatment have been discussed with the patient and family. After consideration of risks, benefits and other options for treatment, the patient has consented to  Procedure(s): OLECRANON BURSECTOMY (Left) as a surgical intervention .  The patient's history has been reviewed, patient examined, no change in status, stable for surgery.  I have reviewed the patient's chart and labs.  Questions were answered to the patient's satisfaction.     Carole Civil

## 2014-01-11 NOTE — Discharge Instructions (Signed)
Olecranon Bursitis  with Rehab A bursa is a fluid filled sac that is located between soft tissues (ligaments, tendons, skin) and bones. The purpose of a bursa is to allow the soft tissue to function smoothly, without friction. The olecrenon bursa is located between the back of the elbow (olecrenon) and the skin. Olecrenon bursitis involves inflammation of this bursa, resulting in pain. SYMPTOMS   Pain, tenderness, swelling, warmth, or redness over the back of the elbow.  Reduced range of motion of the affected elbow.  Sometimes, severe pain with movement of the affected elbow.  Crackling sound (crepitation) when the bursa is moved or touched.  Often, painless swelling of the bursa.  Fever (when infected). CAUSES  Olecranon bursitis is often caused by direct hit (trauma) to the elbow. Less commonly, it is due to overuse and/or strenuous exercise that the elbow is not used to. RISK INCREASES WITH:  Sports that require bending or landing on the elbow (football, volleyball).  Vigorous or repetitive athletic training, or sudden increase or change in activity level (weekend warriors).  Failure to warm up properly before activity.  Poor exercise technique.  Playing on artificial turf. PREVENTION  Avoid injuries and the overuse of muscles whenever possible.  Warm up and stretch properly before activity.  Allow for adequate recovery between workouts.  Maintain physical fitness:  Strength, flexibility, and endurance.  Cardiovascular fitness.  Learn and use proper technique.  Wear properly fitted and padded protective equipment. PROGNOSIS  If treated properly, olecranon bursitis is usually curable within 2 weeks.  RELATED COMPLICATIONS   Longer healing time, if not properly treated or if not given enough time to heal.  Recurring symptoms that result in a chronic problem.  Joint stiffness with permanent limitation of the affected joint's movement.  Infection of the  bursa.  Chronic inflammation or scarring of the bursa. TREATMENT Treatment first involves the use of ice and medicine, to reduce pain and inflammation. The use of strengthening and stretching exercises may help reduce pain with activity. These exercises may be performed at home or with a therapist. Elbow pads may be advised, to protect the bursa. If symptoms persist, despite non-surgical treatment, a procedure to withdraw fluid from the bursa may be advised. This procedure may be accompanied with an injection of corticosteroids, to reduce inflammation. Sometimes, surgery is needed to remove the bursa. MEDICATION  If pain medicine is needed, nonsteroidal anti-inflammatory medicines (aspirin and ibuprofen), or other minor pain relievers (acetaminophen), are often advised.  Do not take pain medicine for 7 days before surgery.  Prescription pain relievers may be given, if your caregiver thinks they are needed. Use only as directed and only as much as you need.  Corticosteroid injections may be given by your caregiver. These injections should be reserved for the most serious cases, because they may only be given a certain number of times. HEAT AND COLD  Cold treatment (icing) should be applied for 10 to 15 minutes every 2 to 3 hours for inflammation and pain, and immediately after activity that aggravates your symptoms. Use ice packs or an ice massage.  Heat treatment may be used before performing stretching and strengthening activities prescribed by your caregiver, physical therapist, or athletic trainer. Use a heat pack or a warm water soak. SEEK IMMEDIATE MEDICAL CARE IF:   Symptoms get worse or do not improve in 2 weeks, despite treatment.  Signs of infection develop, including fever of 102 F (38.9 C), increased pain, redness, warmth, or pus draining from  the bursa.  New, unexplained symptoms develop. (Drugs used in treatment may produce side effects.) EXERCISES  RANGE OF MOTION (ROM) AND  STRETCHING EXERCISES - Olecranon Bursitis These exercises may help you when beginning to rehabilitate your injury. Your symptoms may resolve with or without further involvement from your physician, physical therapist or athletic trainer. While completing these exercises, remember:   Restoring tissue flexibility helps normal motion to return to the joints. This allows healthier, less painful movement and activity.  An effective stretch should be held for at least 30 seconds.  A stretch should never be painful. You should only feel a gentle lengthening or release in the stretched tissue. RANGE OF MOTION  Elbow Flexion, Supine  Lie on your back. Extend your right / left arm into the air, bracing it with your opposite hand. Allow your right / left arm to relax.  Let your elbow bend, allowing your hand to fall slowly toward your chest.  You should feel a gentle stretch along the back of your upper arm and elbow. Your physician, physical therapist or athletic trainer may ask you to hold a __________ hand weight to increase the intensity of this stretch.  Hold for __________ seconds. Slowly return your right / left arm to the upright position. Repeat __________ times. Complete this exercise __________ times per day. STRETCH  Elbow Flexors  Lie on a firm bed or countertop on your back. Be sure that you are in a comfortable position which will allow you to relax your arm muscles.  Place a folded towel under your right / left upper arm, so that your elbow and shoulder are at the same height. Extend your arm; your elbow should not rest on the bed or towel  Allow the weight of your hand to straighten your elbow. Keep your arm and chest muscles relaxed. Your caregiver may ask you to increase the intensity of your stretch by adding a small wrist or hand weight.  Hold for __________ seconds. You should feel a stretch on the inside of your elbow. Slowly return to the starting position. Repeat __________  times. Complete this exercise __________ times per day. STRENGTHENING EXERCISES - Olecranon Bursitis These exercises will help you regain your strength. They may resolve your symptoms with or without further involvement from your physician, physical therapist or athletic trainer. While completing these exercises, remember:   Muscles can gain both the endurance and the strength needed for everyday activities through controlled exercises.  Complete these exercises as instructed by your physician, physical therapist or athletic trainer. Increase the resistance and repetitions only as guided by your caregiver.  You may experience muscle soreness or fatigue, but the pain or discomfort you are trying to eliminate should never worsen during these exercises. If this pain does worsen, stop and make certain you are following the directions exactly. If the pain is still present after adjustments, discontinue the exercise until you can discuss the trouble with your caregiver. STRENGTH - Elbow Extensors, Isometric  Stand or sit upright on a firm surface. Place your right / left arm so that your palm faces your stomach, and it is at the height of your waist.  Place your opposite hand on the underside of your forearm. Gently push up as your right / left arm resists. Push as hard as you can with both arms without causing any pain or movement at your right / left elbow. Hold this stationary position for __________ seconds.  Gradually release the tension in both arms. Allow  your muscles to relax completely before repeating. Repeat __________ times. Complete this exercise __________ times per day. STRENGTH - Elbow Flexors, Isometric  Stand or sit upright on a firm surface. Place your right / left arm so that your hand is palm-up and at the height of your waist.  Place your opposite hand on top of your forearm. Gently push down as your right / left arm resists. Push as hard as you can with both arms without causing  any pain or movement at your right / left elbow. Hold this stationary position for __________ seconds.  Gradually release the tension in both arms. Allow your muscles to relax completely before repeating. Repeat __________ times. Complete this exercise __________ times per day. STRENGTH  Elbow Flexors, Supinated  With good posture, stand or sit on a firm chair without armrests. Allow your right / left arm to rest at your side with your palm facing forward.  Holding a __________ weight, or gripping a rubber exercise band or tubing,  bring your hand toward your shoulder.  Allow your muscles to control the resistance as your hand returns to your side. Repeat __________ times. Complete this exercise __________ times per day.  STRENGTH  Elbow Flexors, Neutral  With good posture, stand or sit on a firm chair without armrests. Allow your right / left arm to rest at your side with your thumb facing forward.  Holding a __________weight, or gripping a rubber exercise band or tubing,  bring your hand toward your shoulder.  Allow your muscles to control the resistance as your hand returns to your side. Repeat __________ times. Complete this exercise __________ times per day.  STRENGTH  Elbow Extensors  Lie on your back. Extend your right / left elbow into the air, pointing it toward the ceiling. Brace your arm with your opposite hand.*  Holding a __________ weight in your hand, slowly straighten your right / left elbow.  Allow your muscles to control the weight as your hand returns to its starting position. Repeat __________ times. Complete this exercise __________ times per day. *You may also stand with your elbow overhead and pointed toward the ceiling, supported by your opposite hand. STRENGTH - Elbow Extensors, Dynamic  With good posture, stand, or sit on a firm chair without armrests. Keeping your upper arms at your side, bring both hands up to your right / left shoulder while gripping a  rubber exercise band or tubing. Your right / left hand should be just below the other hand.  Straighten your right / left elbow. Hold for __________ seconds.  Allow your muscles to control the rubber exercise band, as your hand returns to your shoulder. Repeat __________ times. Complete this exercise __________ times per day. Document Released: 08/25/2005 Document Revised: 11/17/2011 Document Reviewed: 12/07/2008 [facility] Patient Information 2014 [place], [place].

## 2014-01-11 NOTE — Anesthesia Preprocedure Evaluation (Addendum)
Anesthesia Evaluation  Patient identified by MRN, date of birth, ID band Patient awake    Reviewed: Allergy & Precautions, H&P , NPO status , Patient's Chart, lab work & pertinent test results  Airway Mallampati: II      Dental  (+) Teeth Intact   Pulmonary shortness of breath, asthma , pneumonia -, COPD breath sounds clear to auscultation        Cardiovascular hypertension, Pt. on medications + Past MI Rhythm:Regular     Neuro/Psych PSYCHIATRIC DISORDERS Depression    GI/Hepatic Neg liver ROS, hiatal hernia,   Endo/Other  negative endocrine ROS  Renal/GU negative Renal ROS     Musculoskeletal   Abdominal   Peds  Hematology   Anesthesia Other Findings   Reproductive/Obstetrics                          Anesthesia Physical Anesthesia Plan  ASA: III  Anesthesia Plan: MAC and Bier Block   Post-op Pain Management:    Induction: Intravenous  Airway Management Planned: Nasal Cannula  Additional Equipment:   Intra-op Plan:   Post-operative Plan:   Informed Consent: I have reviewed the patients History and Physical, chart, labs and discussed the procedure including the risks, benefits and alternatives for the proposed anesthesia with the patient or authorized representative who has indicated his/her understanding and acceptance.     Plan Discussed with:   Anesthesia Plan Comments:         Anesthesia Quick Evaluation

## 2014-01-11 NOTE — Transfer of Care (Signed)
Immediate Anesthesia Transfer of Care Note  Patient: Jordan [name]  Procedure(s) Performed: Procedure(s): OLECRANON BURSECTOMY (Left)  Patient Location: PACU  Anesthesia Type:Bier block  Level of Consciousness: awake, alert  and patient cooperative  Airway & Oxygen Therapy: Patient Spontanous Breathing and Patient connected to face mask oxygen  Post-op Assessment: Report given to PACU RN, Post -op Vital signs reviewed and stable and Patient moving all extremities  Post vital signs: Reviewed and stable  Complications: No apparent anesthesia complications

## 2014-01-12 ENCOUNTER — Encounter (HOSPITAL_COMMUNITY): Payer: Self-pay | Admitting: Orthopedic Surgery

## 2014-01-16 ENCOUNTER — Ambulatory Visit (INDEPENDENT_AMBULATORY_CARE_PROVIDER_SITE_OTHER): Payer: Self-pay | Admitting: Orthopedic Surgery

## 2014-01-16 VITALS — BP 163/87 | Ht 63.0 in | Wt 141.0 lb

## 2014-01-16 DIAGNOSIS — M7022 Olecranon bursitis, left elbow: Secondary | ICD-10-CM

## 2014-01-16 DIAGNOSIS — M7020 Olecranon bursitis, unspecified elbow: Secondary | ICD-10-CM

## 2014-01-16 NOTE — Progress Notes (Signed)
Patient came in today for post op # 1 Left elbow bursectomy DOS 01/11/14. Dr. Aline Brochure was unavailable due to surgery. Dressing was changed. Tegaderm applied. Incision was clean no drainage noted. Advised to keep dry and appointment was made for 01/24/14 for sutures out.

## 2014-01-16 NOTE — Patient Instructions (Signed)
Keep Dry

## 2014-01-17 NOTE — Telephone Encounter (Addendum)
Per call back to [facility], 01/03/14, spoke with Chawana S, no pre-authorization is required (CPT [phone]).  Regarding the other insurance plan noted, [employer], per IDA, also on 01/03/14, states this plan is an in-patient hospital indemnity only; therefore, it will not apply in the out-patient setting.

## 2014-01-20 ENCOUNTER — Telehealth: Payer: Self-pay | Admitting: *Deleted

## 2014-01-20 NOTE — Telephone Encounter (Signed)
Patient came in today stating she needed her bandage changed. I changed her bandage and noticed she had a significant amount of swelling. Her appointment was moved up to Monday 01/23/14, and I advised to continue to ice and elevate.

## 2014-01-23 ENCOUNTER — Ambulatory Visit (INDEPENDENT_AMBULATORY_CARE_PROVIDER_SITE_OTHER): Payer: Medicare Other | Admitting: Orthopedic Surgery

## 2014-01-23 DIAGNOSIS — M25529 Pain in unspecified elbow: Secondary | ICD-10-CM

## 2014-01-23 DIAGNOSIS — M25522 Pain in left elbow: Secondary | ICD-10-CM

## 2014-01-23 DIAGNOSIS — M7022 Olecranon bursitis, left elbow: Secondary | ICD-10-CM

## 2014-01-23 DIAGNOSIS — M7020 Olecranon bursitis, unspecified elbow: Secondary | ICD-10-CM

## 2014-01-23 DIAGNOSIS — T148XXA Other injury of unspecified body region, initial encounter: Secondary | ICD-10-CM | POA: Insufficient documentation

## 2014-01-23 NOTE — Progress Notes (Signed)
Patient ID: Jordan [name], female   DOB: 20-Feb-1946, 68 y.o.   MRN: [id number]  Recheck left elbow status post bursectomy. The patient has some subcutaneous swelling. Her wound looks good her staples were removed today. We also aspirated about 35 cc of hematoma. Sterile dressing applied with an Ace wrap. Wear Ace wrap for one week to apply pressure okay to get in the salt water on Thursday  Followup in a week to check the incision and the wound.

## 2014-01-23 NOTE — Patient Instructions (Signed)
Wear ace wrap x 1 weeks   Remove for bathing

## 2014-01-24 ENCOUNTER — Ambulatory Visit: Payer: Medicare Other | Admitting: Orthopedic Surgery

## 2014-01-31 ENCOUNTER — Ambulatory Visit (INDEPENDENT_AMBULATORY_CARE_PROVIDER_SITE_OTHER): Payer: Medicare Other | Admitting: Orthopedic Surgery

## 2014-01-31 DIAGNOSIS — M7020 Olecranon bursitis, unspecified elbow: Secondary | ICD-10-CM

## 2014-01-31 DIAGNOSIS — M7022 Olecranon bursitis, left elbow: Secondary | ICD-10-CM

## 2014-01-31 DIAGNOSIS — T148XXA Other injury of unspecified body region, initial encounter: Secondary | ICD-10-CM

## 2014-01-31 NOTE — Progress Notes (Signed)
Patient ID: [name], female   DOB: August 06, 1946, 68 y.o.   MRN: [id number]  Chief Complaint  Patient presents with  . Wound Check    01/11/2014 excision of olecranon bursa/ganglion cyst; recheck after aspiration a postop seroma    Decreased swelling no pain wound looks clean small fluid collection.  Recommend re\re aspiration  After verbal consent and timeout the left elbow was confirmed as a surgical site. Alcohol prep and ethyl chloride and a 10 cc syringe with an 18-gauge needle was used to aspirate 10 cc of thin blood-tinged fluid  Return 2 weeks keep pressure on the elbow to prevent fluid accumulation

## 2014-02-14 ENCOUNTER — Ambulatory Visit (INDEPENDENT_AMBULATORY_CARE_PROVIDER_SITE_OTHER): Payer: Medicare Other | Admitting: Orthopedic Surgery

## 2014-02-14 VITALS — BP 176/98 | Ht 63.0 in | Wt 141.0 lb

## 2014-02-14 DIAGNOSIS — M7020 Olecranon bursitis, unspecified elbow: Secondary | ICD-10-CM

## 2014-02-14 DIAGNOSIS — M7022 Olecranon bursitis, left elbow: Secondary | ICD-10-CM

## 2014-02-14 DIAGNOSIS — T148XXA Other injury of unspecified body region, initial encounter: Secondary | ICD-10-CM

## 2014-02-14 NOTE — Progress Notes (Signed)
Patient ID: Jordan [name], female   DOB: April 30, 1946, 68 y.o.   MRN: [id number]  Chief Complaint  Patient presents with  . Follow-up    2 week recheck left elbow DOS 01/11/14    Postop removal of mass left elbow she's had 2 or 3 aspiration she's down to about a few cc of fluid left. The wound is clean without signs of infection  Small pocket of fluid is aspirated  Blood-tinged fluid about 3 cc  Return for reevaluation in about 1 month  Aspiration left elbow Diagnosis seroma/sub-Q. postop hematoma Medication none Topical anesthetic of the chloride Alcohol prep  We aspirated 3 cc blood-tinged fluid there were no complications

## 2014-03-14 ENCOUNTER — Encounter: Payer: Self-pay | Admitting: Orthopedic Surgery

## 2014-03-14 ENCOUNTER — Ambulatory Visit (INDEPENDENT_AMBULATORY_CARE_PROVIDER_SITE_OTHER): Payer: Medicare Other | Admitting: Orthopedic Surgery

## 2014-03-14 VITALS — BP 184/100 | Ht 63.0 in | Wt 141.0 lb

## 2014-03-14 DIAGNOSIS — M7020 Olecranon bursitis, unspecified elbow: Secondary | ICD-10-CM

## 2014-03-14 DIAGNOSIS — M7022 Olecranon bursitis, left elbow: Secondary | ICD-10-CM

## 2014-03-14 NOTE — Patient Instructions (Signed)
activities as tolerated 

## 2014-03-14 NOTE — Progress Notes (Signed)
Patient ID: [name], female   DOB: 06-12-1946, 68 y.o.   MRN: [id number]   Chief Complaint  Patient presents with  . Follow-up    recheck left elbow, DOS 01/11/14    left elbow DOS 01/11/14     Postop removal of mass left elbow she's had 2 or 3 aspiration she's down to about a few cc of fluid left. The wound is clean without signs of infection  There is no swelling or effusion or subcutaneous fluid and there are no signs of infection, she says her elbow feels good  She will followup with [place] as needed

## 2014-05-17 ENCOUNTER — Encounter: Payer: Self-pay | Admitting: Internal Medicine

## 2014-10-09 ENCOUNTER — Other Ambulatory Visit (HOSPITAL_COMMUNITY): Payer: Self-pay | Admitting: Neurosurgery

## 2014-10-09 DIAGNOSIS — M5416 Radiculopathy, lumbar region: Secondary | ICD-10-CM

## 2014-10-17 ENCOUNTER — Ambulatory Visit (HOSPITAL_COMMUNITY)
Admission: RE | Admit: 2014-10-17 | Discharge: 2014-10-17 | Disposition: A | Discharge status: Home / Self Care | Payer: Medicare Other | Source: Ambulatory Visit | Attending: Neurosurgery | Admitting: Neurosurgery

## 2014-10-17 DIAGNOSIS — M5416 Radiculopathy, lumbar region: Secondary | ICD-10-CM | POA: Diagnosis not present

## 2014-10-17 LAB — POCT I-STAT CREATININE: Creatinine, Ser: 1.2 mg/dL — ABNORMAL HIGH (ref 0.50–1.10)

## 2014-10-17 MED ORDER — GADOBENATE DIMEGLUMINE 529 MG/ML IV SOLN
13.0000 mL | Freq: Once | INTRAVENOUS | Status: AC | PRN
  Administered 2014-10-17: 13 mL via INTRAVENOUS

## 2015-04-10 ENCOUNTER — Ambulatory Visit (INDEPENDENT_AMBULATORY_CARE_PROVIDER_SITE_OTHER): Payer: Medicare Other

## 2015-04-10 ENCOUNTER — Ambulatory Visit (INDEPENDENT_AMBULATORY_CARE_PROVIDER_SITE_OTHER): Payer: Medicare Other | Admitting: Orthopedic Surgery

## 2015-04-10 VITALS — BP 126/62 | Ht 63.0 in | Wt 130.0 lb

## 2015-04-10 DIAGNOSIS — M1711 Unilateral primary osteoarthritis, right knee: Secondary | ICD-10-CM | POA: Diagnosis not present

## 2015-04-10 DIAGNOSIS — M25561 Pain in right knee: Secondary | ICD-10-CM | POA: Diagnosis not present

## 2015-04-10 MED ORDER — HYDROCODONE-ACETAMINOPHEN 7.5-325 MG PO TABS: 1.0000 | ORAL_TABLET | Freq: Four times a day (QID) | ORAL | Status: DC | PRN

## 2015-04-10 MED ORDER — DICLOFENAC POTASSIUM 50 MG PO TABS: 50.0000 mg | ORAL_TABLET | Freq: Two times a day (BID) | ORAL | Status: DC

## 2015-04-10 NOTE — Patient Instructions (Signed)
Joint Injection  Care After  Refer to this sheet in the next few days. These instructions provide you with information on caring for yourself after you have had a joint injection. Your caregiver also may give you more specific instructions. Your treatment has been planned according to current medical practices, but problems sometimes occur. Call your caregiver if you have any problems or questions after your procedure.  After any type of joint injection, it is not uncommon to experience:  · Soreness, swelling, or bruising around the injection site.  · Mild numbness, tingling, or weakness around the injection site caused by the numbing medicine used before or with the injection.  It also is possible to experience the following effects associated with the specific agent after injection:  · Iodine-based contrast agents:  ¨ Allergic reaction (itching, hives, widespread redness, and swelling beyond the injection site).  · Corticosteroids (These effects are rare.):  ¨ Allergic reaction.  ¨ Increased blood sugar levels (If you have diabetes and you notice that your blood sugar levels have increased, notify your caregiver).  ¨ Increased blood pressure levels.  ¨ Mood swings.  · Hyaluronic acid in the use of viscosupplementation.  ¨ Temporary heat or redness.  ¨ Temporary rash and itching.  ¨ Increased fluid accumulation in the injected joint.  These effects all should resolve within a day after your procedure.   HOME CARE INSTRUCTIONS  · Limit yourself to light activity the day of your procedure. Avoid lifting heavy objects, bending, stooping, or twisting.  · Take prescription or over-the-counter pain medication as directed by your caregiver.  · You may apply ice to your injection site to reduce pain and swelling the day of your procedure. Ice may be applied 03-04 times:  ¨ Put ice in a plastic bag.  ¨ Place a towel between your skin and the bag.  ¨ Leave the ice on for no longer than 15-20 minutes each time.  SEEK  IMMEDIATE MEDICAL CARE IF:   · Pain and swelling get worse rather than better or extend beyond the injection site.  · Numbness does not go away.  · Blood or fluid continues to leak from the injection site.  · You have chest pain.  · You have swelling of your face or tongue.  · You have trouble breathing or you become dizzy.  · You develop a fever, chills, or severe tenderness at the injection site that last longer than 1 day.  MAKE SURE YOU:  · Understand these instructions.  · Watch your condition.  · Get help right away if you are not doing well or if you get worse.  Document Released: 05/08/2011 Document Revised: 11/17/2011 Document Reviewed: 05/08/2011  ExitCare® Patient Information ©2015 [employer]. This information is not intended to replace advice given to you by your health care provider. Make sure you discuss any questions you have with your health care provider.

## 2015-04-10 NOTE — Progress Notes (Signed)
Patient ID: Jordan [name], female   DOB: March 14, 1946, 69 y.o.   MRN: [id number]  new problem    Chief Complaint  Patient presents with  . Knee Pain    Right knee pain, no injury.     Jordan [name] is a 69 y.o. female.   HPI  69 year old female with a long history of pain in her right knee and progressive deformity presents for evaluation and treatment of her right knee. Complains of pain over the last year increasing associated with swelling deformity and giving way. Previous treatment none. She does take some hydrocodone for other reasons and that seems to help her knee. She has difficulties with activities of daily living her pain is 8 out of 10 and has become constant  Report or she reports history of breathing issues and wheezing shortness of breath sinusitis frequent urination excessive nighttime urination depression hayfever food allergy back pain and swollen joints    Review of Systems See hpi  Past Medical History  Diagnosis Date  . Allergy     seasonal  . Blood transfusion 2007    no abnormal reation noted   . COPD (chronic obstructive pulmonary disease)   . Hyperlipidemia     hx of-was on meds but has been off x 1 1/2 yrs  . Osteoporosis     takes Fosamax weekly  . IBS (irritable bowel syndrome)   . C. difficile colitis     2007: treated with Flagyl and Vanc, 2014: treated with vanc, Aug 2014: Vanc taper  . Hemorrhoids   . Asthma     uses Advair and Spiriva daily,Albuterol prn.Takes Singulair at bedtime and Prednisone daily  . Back spasm     takes Zanaflex daily as needed  . Depression     takes Prozac daily  . Complication of anesthesia     woke up during one surgery  . Hypertension     takes Verapamil nightly  . Shortness of breath     with exertion daily  . Pneumonia     MRSA pneumonia in 2007  . History of bronchitis     last time a yr ago  . Tingling     and pain in left leg  . Chronic back pain   . Bursitis     left elbow  . Bruises easily    d/t being on Prednisone  . H/O hiatal hernia   . History of colon polyps   . Urinary frequency   . History of kidney stones   . Nocturia   . Insomnia     takes Benadryl as needed  . Myocardial infarction 2007    Takotsubo cardiomyopathy    Past Surgical History  Procedure Laterality Date  . Appendectomy    . Gastric fundoplication      x 2  . Oophorectomy Right   . Neck surgery      fusion  . Cholecystectomy    . Knee arthroscopy Right   . Colonoscopy   09/24/2006    RMR: Normal rectum, left-sided diverticula  . Esophagogastroduodenoscopy  04/07/2006    [id number] esophagus s/p placement of Bravo pH probe  . Colonoscopy  Oct 2012    Dr. Henrene Pastor: tubular adenomas, moderative diverticulosis, internal hemorrhoids  . Tonsillectomy    . Cataract surgery Bilateral   . Carpal tunnel release Left   . Hemorrhoidal banding    . Cystoscopy    . Lumbar laminectomy/decompression microdiscectomy Left 10/17/2013    Procedure: LUMBAR LAMINECTOMY/DECOMPRESSION  MICRODISCECTOMY 1 LEVEL three/four;  Surgeon: Ophelia Charter, MD;  Location: [place] [facility];  Service: Neurosurgery;  Laterality: Left;  . Hernia repair      umbilical  . Olecranon bursectomy Left 01/11/2014    Procedure: OLECRANON BURSECTOMY;  Surgeon: Carole Civil, MD;  Location: [facility];  Service: Orthopedics;  Laterality: Left;    Family History  Problem Relation Age of Onset  . Heart disease Father   . Colon cancer Neg Hx     Social History History  Substance Use Topics  . Smoking status: Never Smoker   . Smokeless tobacco: Never Used  . Alcohol Use: 0.0 oz/week     Comment: couple of beers daily    Allergies  Allergen Reactions  . Cardura [Doxazosin Mesylate] Hives  . Other     Strawberry-hives  . Sulfa Antibiotics Nausea Only  . Barbiturates Nausea And Vomiting and Rash    Current Outpatient Prescriptions  Medication Sig Dispense Refill  . albuterol (PROVENTIL HFA;VENTOLIN HFA) 108 (90 BASE) MCG/ACT  inhaler Inhale 2 puffs into the lungs every 6 (six) hours as needed for wheezing.    Marland Kitchen alendronate (FOSAMAX) 70 MG tablet Take 70 mg by mouth every Thursday. Take with a full glass of water on an empty stomach.    . Cholecalciferol (VITAMIN D-3) 5000 UNITS TABS Take 5,000 Units by mouth daily.    . diazepam (VALIUM) 5 MG tablet Take 1 tablet (5 mg total) by mouth every 6 (six) hours as needed for muscle spasms. 50 tablet 1  . diclofenac (CATAFLAM) 50 MG tablet Take 1 tablet (50 mg total) by mouth 2 (two) times daily. 60 tablet 1  . FLUoxetine (PROZAC) 40 MG capsule Take 40 mg by mouth daily.      . Fluticasone-Salmeterol (ADVAIR) 500-50 MCG/DOSE AEPB Inhale 1 puff into the lungs 2 (two) times daily.      Marland Kitchen HYDROcodone-acetaminophen (NORCO) 7.5-325 MG per tablet Take 1 tablet by mouth every 6 (six) hours as needed for moderate pain. 60 tablet 0  . HYDROcodone-acetaminophen (NORCO/VICODIN) 5-325 MG per tablet Take 1 tablet by mouth every 4 (four) hours as needed for moderate pain. 30 tablet 0  . montelukast (SINGULAIR) 10 MG tablet Take 10 mg by mouth at bedtime.      . predniSONE (DELTASONE) 10 MG tablet Take 10 mg by mouth daily.      . Probiotic Product (PROBIOTIC PO) Take 1 tablet by mouth every morning.    . tiotropium (SPIRIVA) 18 MCG inhalation capsule Place 18 mcg into inhaler and inhale daily.      . verapamil (CALAN-SR) 120 MG CR tablet Take 120 mg by mouth daily.      No current facility-administered medications for this visit.       Physical Exam Blood pressure 126/62, height 5\' 3"  (1.6 m), weight 130 lb (58.968 kg). Physical Exam The patient is well developed well nourished and well groomed. Orientation to person place and time is normal  Mood is pleasant. Ambulatory status  Normal ambulatory status at this time there are no supportive devices  Her left knee actually has normal range of motion stability and strength  Right knee is in valgus lateral joint line tenderness no  effusion flexion 120 extension full scans intact pulses are good sensation is normal   Data Reviewed  x-ray show severe valgus deformity of the knee joint space narrowing subchondral sclerosis minimal osteophytes  Severe valgus knee x-ray ordered and reviewed and read by me  personally  Assessment  osteoarthritis right knee with valgus deformity. The patient was given options of surgical and nonsurgical treatment and wishes to try nonsurgical treatment Plan  we started diclofenac and gave her some hydrocodone and a cortisone injection  Procedure note right knee injection verbal consent was obtained to inject right knee joint  Timeout was completed to confirm the site of injection  The medications used were 40 mg of Depo-Medrol and 1% lidocaine 3 cc  Anesthesia was provided by ethyl chloride and the skin was prepped with alcohol.  After cleaning the skin with alcohol a 20-gauge needle was used to inject the right knee joint. There were no complications. A sterile bandage was applied.

## 2015-05-17 ENCOUNTER — Other Ambulatory Visit: Payer: Self-pay | Admitting: Adult Health

## 2015-05-17 DIAGNOSIS — Z1231 Encounter for screening mammogram for malignant neoplasm of breast: Secondary | ICD-10-CM

## 2015-05-25 ENCOUNTER — Ambulatory Visit (HOSPITAL_COMMUNITY)
Admission: RE | Admit: 2015-05-25 | Discharge: 2015-05-25 | Disposition: A | Discharge status: Home / Self Care | Payer: Medicare Other | Source: Ambulatory Visit | Attending: Adult Health | Admitting: Adult Health

## 2015-05-25 DIAGNOSIS — Z1231 Encounter for screening mammogram for malignant neoplasm of breast: Secondary | ICD-10-CM

## 2015-06-04 ENCOUNTER — Other Ambulatory Visit (HOSPITAL_COMMUNITY): Payer: Self-pay | Admitting: Pulmonary Disease

## 2015-06-04 DIAGNOSIS — R7989 Other specified abnormal findings of blood chemistry: Secondary | ICD-10-CM

## 2015-06-04 DIAGNOSIS — R945 Abnormal results of liver function studies: Secondary | ICD-10-CM

## 2015-06-06 ENCOUNTER — Ambulatory Visit (HOSPITAL_COMMUNITY)
Admission: RE | Admit: 2015-06-06 | Discharge: 2015-06-06 | Disposition: A | Discharge status: Home / Self Care | Payer: Medicare Other | Source: Ambulatory Visit | Attending: Pulmonary Disease | Admitting: Pulmonary Disease

## 2015-06-06 DIAGNOSIS — R7989 Other specified abnormal findings of blood chemistry: Secondary | ICD-10-CM | POA: Diagnosis not present

## 2015-06-06 DIAGNOSIS — Z87442 Personal history of urinary calculi: Secondary | ICD-10-CM | POA: Insufficient documentation

## 2015-06-06 DIAGNOSIS — R945 Abnormal results of liver function studies: Secondary | ICD-10-CM

## 2015-06-06 DIAGNOSIS — R944 Abnormal results of kidney function studies: Secondary | ICD-10-CM | POA: Diagnosis not present

## 2015-06-06 DIAGNOSIS — M549 Dorsalgia, unspecified: Secondary | ICD-10-CM | POA: Insufficient documentation

## 2015-06-12 ENCOUNTER — Ambulatory Visit: Payer: Medicare Other | Admitting: Orthopedic Surgery

## 2015-07-17 ENCOUNTER — Ambulatory Visit: Payer: Medicare Other | Admitting: Orthopedic Surgery

## 2015-09-13 ENCOUNTER — Ambulatory Visit (INDEPENDENT_AMBULATORY_CARE_PROVIDER_SITE_OTHER): Payer: Medicare Other | Admitting: Orthopedic Surgery

## 2015-09-13 ENCOUNTER — Ambulatory Visit (INDEPENDENT_AMBULATORY_CARE_PROVIDER_SITE_OTHER): Payer: Medicare Other

## 2015-09-13 VITALS — Ht 63.0 in | Wt 130.0 lb

## 2015-09-13 DIAGNOSIS — R2231 Localized swelling, mass and lump, right upper limb: Secondary | ICD-10-CM | POA: Diagnosis not present

## 2015-09-13 DIAGNOSIS — M1711 Unilateral primary osteoarthritis, right knee: Secondary | ICD-10-CM | POA: Diagnosis not present

## 2015-09-13 MED ORDER — HYDROCODONE-ACETAMINOPHEN 7.5-325 MG PO TABS: 1.0000 | ORAL_TABLET | Freq: Four times a day (QID) | ORAL | Status: DC | PRN

## 2015-09-13 NOTE — Patient Instructions (Signed)
Surgery Jan 13 Rt wrist ganglion cyst removal

## 2015-09-13 NOTE — Progress Notes (Signed)
Chief Complaint  Patient presents with  . Follow-up    Recheck right knee after injection.    Recheck right knee after injection and oral medication for osteoarthritis with valgus knee patients as needed feels better but she would like a second injection of possible.  She is on hydrocodone. Cataflam 50.  Reinject right knee  Procedure note right knee injection verbal consent was obtained to inject right knee joint  Timeout was completed to confirm the site of injection  The medications used were 40 mg of Depo-Medrol and 1% lidocaine 3 cc  Anesthesia was provided by ethyl chloride and the skin was prepped with alcohol.  After cleaning the skin with alcohol a 20-gauge needle was used to inject the right knee joint. There were no complications. A sterile bandage was applied.   New problem pain right wrist 3 months with mass  Patient is 70 year old female right wrist volar aspect all aching moderate to severe pain for 3 months associated with a mass  Denies fever rash laceration  Past Medical History  Diagnosis Date  . Allergy     seasonal  . Blood transfusion 2007    no abnormal reation noted   . COPD (chronic obstructive pulmonary disease)   . Hyperlipidemia     hx of-was on meds but has been off x 1 1/2 yrs  . Osteoporosis     takes Fosamax weekly  . IBS (irritable bowel syndrome)   . C. difficile colitis     2007: treated with Flagyl and Vanc, 2014: treated with vanc, Aug 2014: Vanc taper  . Hemorrhoids   . Asthma     uses Advair and Spiriva daily,Albuterol prn.Takes Singulair at bedtime and Prednisone daily  . Back spasm     takes Zanaflex daily as needed  . Depression     takes Prozac daily  . Complication of anesthesia     woke up during one surgery  . Hypertension     takes Verapamil nightly  . Shortness of breath     with exertion daily  . Pneumonia     MRSA pneumonia in 2007  . History of bronchitis     last time a yr ago  . Tingling     and pain in  left leg  . Chronic back pain   . Bursitis     left elbow  . Bruises easily     d/t being on Prednisone  . H/O hiatal hernia   . History of colon polyps   . Urinary frequency   . History of kidney stones   . Nocturia   . Insomnia     takes Benadryl as needed  . Myocardial infarction [facility] 2007    Takotsubo cardiomyopathy    Her appearance is normal her frame is small she is oriented 3 her mood is pleasant she has a 3 x 2 cm volar mass near the radial artery nonpulsatile normal range of motion in the wrist has mild tenderness over the wrist joint wrist stable motor exam normal skin intact otherwise. Pulses good mass does not appear to be related to the pulse sensation is normal. Opposite wrist compared there is no mass range of motion is full wrist is stable  X-rays of the right wrist  No evidence of a bony mass but she does have scaphoid navicular advance collapse and arthritis of the carpal metacarpal joint  Ganglion cyst right wrist  Patient would like this excised secondary to pain Excision ganglion cyst right  wrist volar aspect

## 2015-09-14 ENCOUNTER — Telehealth: Payer: Self-pay | Admitting: Orthopedic Surgery

## 2015-09-14 NOTE — Telephone Encounter (Signed)
Regarding out-patient surgery scheduled at [facility] 09/21/15, CPT [id number], contacted insurer [facility] [phone]; per [name], 09/14/15, 11:42a.m, no pre-authorization required.

## 2015-09-14 NOTE — Addendum Note (Signed)
Addended by: Christia Reading on: 09/14/2015 09:33 AM   Modules accepted: Orders

## 2015-09-17 NOTE — Patient Instructions (Signed)
Jordan [name]  09/17/2015     @PREFPERIOPPHARMACY @   Your procedure is scheduled on  09/21/2015  Report to [facility] at  700  A.M.  Call this number if you have problems the morning of surgery:  [phone]   Remember:  Do not eat food or drink liquids after midnight.  Take these medicines the morning of surgery with A SIP OF WATER  Valium, prozac, norco, lisinopril, deltasone, verapamil.   Do not wear jewelry, make-up or nail polish.  Do not wear lotions, powders, or perfumes.  You may wear deodorant.  Do not shave 48 hours prior to surgery.  Men may shave face and neck.  Do not bring valuables to the hospital.  [facility] is not responsible for any belongings or valuables.  Contacts, dentures or bridgework may not be worn into surgery.  Leave your suitcase in the car.  After surgery it may be brought to your room.  For patients admitted to the hospital, discharge time will be determined by your treatment team.  Patients discharged the day of surgery will not be allowed to drive home.   Name and phone number of your driver:   family Special instructions:  none  Please read over the following fact sheets that you were given. Pain Booklet, Coughing and Deep Breathing, Surgical Site Infection Prevention, Anesthesia Post-op Instructions and Care and Recovery After Surgery      Ganglion Cyst A ganglion cyst is a noncancerous, fluid-filled lump that occurs near joints or tendons. The ganglion cyst grows out of a joint or the lining of a tendon. It most often develops in the hand or wrist, but it can also develop in the shoulder, elbow, hip, knee, ankle, or foot. The round or oval ganglion cyst can be the size of a pea or larger than a grape. Increased activity may enlarge the size of the cyst because more fluid starts to build up.  CAUSES It is not known what causes a ganglion cyst to grow. However, it may be related to:  Inflammation or irritation around the  joint.  An injury.  Repetitive movements or overuse.  Arthritis. RISK FACTORS Risk factors include:  Being a woman.  Being age 57-50. SIGNS AND SYMPTOMS Symptoms may include:   A lump. This most often appears on the hand or wrist, but it can occur in other areas of the body.  Tingling.  Pain.  Numbness.  Muscle weakness.  Weak grip.  Less movement in a joint. DIAGNOSIS Ganglion cysts are most often diagnosed based on a physical exam. Your health care provider will feel the lump and may shine a light alongside it. If it is a ganglion cyst, a light often shines through it. Your health care provider may order an X-ray, ultrasound, or MRI to rule out other conditions. TREATMENT Ganglion cysts usually go away on their own without treatment. If pain or other symptoms are involved, treatment may be needed. Treatment is also needed if the ganglion cyst limits your movement or if it gets infected. Treatment may include:  Wearing a brace or splint on your wrist or finger.  Taking anti-inflammatory medicine.  Draining fluid from the lump with a needle (aspiration).  Injecting a steroid into the joint.  Surgery to remove the ganglion cyst. HOME CARE INSTRUCTIONS  Do not press on the ganglion cyst, poke it with a needle, or hit it.  Take medicines only as directed by your health care provider.  Wear your brace or splint as directed by your health care provider.  Watch your ganglion cyst for any changes.  Keep all follow-up visits as directed by your health care provider. This is important. SEEK MEDICAL CARE IF:  Your ganglion cyst becomes larger or more painful.  You have increased redness, red streaks, or swelling.  You have pus coming from the lump.  You have weakness or numbness in the affected area.  You have a fever or chills.   This information is not intended to replace advice given to you by your health care provider. Make sure you discuss any questions  you have with your health care provider.   Document Released: 08/22/2000 Document Revised: 09/15/2014 Document Reviewed: 02/07/2014 Elsevier Interactive Patient Education 2016 [employer] PATIENT INSTRUCTIONS POST-ANESTHESIA  IMMEDIATELY FOLLOWING SURGERY:  Do not drive or operate machinery for the first twenty four hours after surgery.  Do not make any important decisions for twenty four hours after surgery or while taking narcotic pain medications or sedatives.  If you develop intractable nausea and vomiting or a severe headache please notify your doctor immediately.  FOLLOW-UP:  Please make an appointment with your surgeon as instructed. You do not need to follow up with anesthesia unless specifically instructed to do so.  WOUND CARE INSTRUCTIONS (if applicable):  Keep a dry clean dressing on the anesthesia/puncture wound site if there is drainage.  Once the wound has quit draining you may leave it open to air.  Generally you should leave the bandage intact for twenty four hours unless there is drainage.  If the epidural site drains for more than 36-48 hours please call the anesthesia department.  QUESTIONS?:  Please feel free to call your physician or the hospital operator if you have any questions, and they will be happy to assist you.

## 2015-09-18 ENCOUNTER — Other Ambulatory Visit (HOSPITAL_COMMUNITY): Payer: Self-pay | Admitting: Pulmonary Disease

## 2015-09-18 DIAGNOSIS — E049 Nontoxic goiter, unspecified: Secondary | ICD-10-CM

## 2015-09-18 DIAGNOSIS — I7090 Unspecified atherosclerosis: Secondary | ICD-10-CM

## 2015-09-19 ENCOUNTER — Other Ambulatory Visit: Payer: Self-pay

## 2015-09-19 ENCOUNTER — Encounter (HOSPITAL_COMMUNITY): Payer: Self-pay

## 2015-09-19 ENCOUNTER — Encounter (HOSPITAL_COMMUNITY)
Admission: RE | Admit: 2015-09-19 | Discharge: 2015-09-19 | Disposition: A | Discharge status: Home / Self Care | Payer: Medicare Other | Source: Ambulatory Visit | Attending: Orthopedic Surgery | Admitting: Orthopedic Surgery

## 2015-09-19 DIAGNOSIS — E049 Nontoxic goiter, unspecified: Secondary | ICD-10-CM | POA: Diagnosis present

## 2015-09-19 DIAGNOSIS — Z01812 Encounter for preprocedural laboratory examination: Secondary | ICD-10-CM | POA: Diagnosis present

## 2015-09-19 DIAGNOSIS — Z01810 Encounter for preprocedural cardiovascular examination: Secondary | ICD-10-CM | POA: Diagnosis present

## 2015-09-19 DIAGNOSIS — I6523 Occlusion and stenosis of bilateral carotid arteries: Secondary | ICD-10-CM | POA: Diagnosis not present

## 2015-09-19 LAB — BASIC METABOLIC PANEL
Anion gap: 8 (ref 5–15)
BUN: 21 mg/dL — ABNORMAL HIGH (ref 6–20)
CO2: 23 mmol/L (ref 22–32)
Calcium: 9.6 mg/dL (ref 8.9–10.3)
Chloride: 108 mmol/L (ref 101–111)
Creatinine, Ser: 1.52 mg/dL — ABNORMAL HIGH (ref 0.44–1.00)
GFR calc Af Amer: 39 mL/min — ABNORMAL LOW (ref >60)
GFR calc non Af Amer: 34 mL/min — ABNORMAL LOW (ref >60)
Glucose, Bld: 81 mg/dL (ref 65–99)
Potassium: 5 mmol/L (ref 3.5–5.1)
Sodium: 139 mmol/L (ref 135–145)

## 2015-09-19 LAB — CBC WITH DIFFERENTIAL/PLATELET
Basophils Absolute: 0.1 10*3/uL (ref 0.0–0.1)
Basophils Relative: 1 %
Eosinophils Absolute: 0.2 10*3/uL (ref 0.0–0.7)
Eosinophils Relative: 3 %
HCT: 29.1 % — ABNORMAL LOW (ref 36.0–46.0)
Hemoglobin: 9.6 g/dL — ABNORMAL LOW (ref 12.0–15.0)
Lymphocytes Relative: 8 %
Lymphs Abs: 0.6 10*3/uL — ABNORMAL LOW (ref 0.7–4.0)
MCH: 31.1 pg (ref 26.0–34.0)
MCHC: 33 g/dL (ref 30.0–36.0)
MCV: 94.2 fL (ref 78.0–100.0)
Monocytes Absolute: 0.6 10*3/uL (ref 0.1–1.0)
Monocytes Relative: 7 %
Neutro Abs: 6.3 10*3/uL (ref 1.7–7.7)
Neutrophils Relative %: 81 %
Platelets: 224 10*3/uL (ref 150–400)
RBC: 3.09 MIL/uL — ABNORMAL LOW (ref 3.87–5.11)
RDW: 13.1 % (ref 11.5–15.5)
WBC: 7.7 10*3/uL (ref 4.0–10.5)

## 2015-09-19 NOTE — Pre-Procedure Instructions (Signed)
Patient given information to sign up for my chart at home. 

## 2015-09-20 ENCOUNTER — Ambulatory Visit (HOSPITAL_COMMUNITY)
Admission: RE | Admit: 2015-09-20 | Discharge: 2015-09-20 | Disposition: A | Discharge status: Home / Self Care | Payer: Medicare Other | Source: Ambulatory Visit | Attending: Pulmonary Disease | Admitting: Pulmonary Disease

## 2015-09-20 DIAGNOSIS — Z01810 Encounter for preprocedural cardiovascular examination: Secondary | ICD-10-CM | POA: Insufficient documentation

## 2015-09-20 DIAGNOSIS — Z01812 Encounter for preprocedural laboratory examination: Secondary | ICD-10-CM | POA: Diagnosis not present

## 2015-09-20 DIAGNOSIS — I7090 Unspecified atherosclerosis: Secondary | ICD-10-CM

## 2015-09-20 DIAGNOSIS — I6523 Occlusion and stenosis of bilateral carotid arteries: Secondary | ICD-10-CM | POA: Insufficient documentation

## 2015-09-20 DIAGNOSIS — E049 Nontoxic goiter, unspecified: Secondary | ICD-10-CM

## 2015-09-20 NOTE — Pre-Procedure Instructions (Signed)
Dr Milford Cage aware of H&H, 9.6/ 29.1. Will notify Dr Luan Pulling of change in H&H from last blood work until now

## 2015-09-20 NOTE — Pre-Procedure Instructions (Signed)
Labs faxed to Dr Luan Pulling office.

## 2015-09-21 ENCOUNTER — Ambulatory Visit (HOSPITAL_COMMUNITY)
Admission: RE | Admit: 2015-09-21 | Discharge: 2015-09-21 | Disposition: A | Discharge status: Home / Self Care | Payer: Medicare Other | Source: Ambulatory Visit | Attending: Orthopedic Surgery | Admitting: Orthopedic Surgery

## 2015-09-21 ENCOUNTER — Encounter (HOSPITAL_COMMUNITY)
Admission: RE | Disposition: A | Discharge status: Home / Self Care | Payer: Self-pay | Source: Ambulatory Visit | Attending: Orthopedic Surgery

## 2015-09-21 ENCOUNTER — Ambulatory Visit (HOSPITAL_COMMUNITY): Payer: Medicare Other | Admitting: Anesthesiology

## 2015-09-21 ENCOUNTER — Encounter (HOSPITAL_COMMUNITY): Payer: Self-pay | Admitting: *Deleted

## 2015-09-21 DIAGNOSIS — M67439 Ganglion, unspecified wrist: Secondary | ICD-10-CM | POA: Insufficient documentation

## 2015-09-21 DIAGNOSIS — Z79899 Other long term (current) drug therapy: Secondary | ICD-10-CM | POA: Insufficient documentation

## 2015-09-21 DIAGNOSIS — M810 Age-related osteoporosis without current pathological fracture: Secondary | ICD-10-CM | POA: Diagnosis not present

## 2015-09-21 DIAGNOSIS — I10 Essential (primary) hypertension: Secondary | ICD-10-CM | POA: Diagnosis not present

## 2015-09-21 DIAGNOSIS — Z7952 Long term (current) use of systemic steroids: Secondary | ICD-10-CM | POA: Insufficient documentation

## 2015-09-21 DIAGNOSIS — M67431 Ganglion, right wrist: Secondary | ICD-10-CM | POA: Insufficient documentation

## 2015-09-21 DIAGNOSIS — F329 Major depressive disorder, single episode, unspecified: Secondary | ICD-10-CM | POA: Insufficient documentation

## 2015-09-21 DIAGNOSIS — J449 Chronic obstructive pulmonary disease, unspecified: Secondary | ICD-10-CM | POA: Insufficient documentation

## 2015-09-21 HISTORY — PX: GANGLION CYST EXCISION: SHX1691

## 2015-09-21 HISTORY — PX: CARPAL TUNNEL RELEASE: SHX101

## 2015-09-21 SURGERY — EXCISION, GANGLION CYST, WRIST
Anesthesia: Regional | Laterality: Right

## 2015-09-21 MED ORDER — HYDROCODONE-ACETAMINOPHEN 7.5-325 MG PO TABS: 1.0000 | ORAL_TABLET | Freq: Four times a day (QID) | ORAL | Status: DC | PRN

## 2015-09-21 MED ORDER — LIDOCAINE HCL (PF) 1 % IJ SOLN
INTRAMUSCULAR | Status: AC
  Filled 2015-09-21: qty 5

## 2015-09-21 MED ORDER — FENTANYL CITRATE (PF) 100 MCG/2ML IJ SOLN
INTRAMUSCULAR | Status: DC | PRN
  Administered 2015-09-21: 25 ug via INTRAVENOUS

## 2015-09-21 MED ORDER — PROPOFOL 500 MG/50ML IV EMUL
INTRAVENOUS | Status: DC | PRN
  Administered 2015-09-21: 75 ug/kg/min via INTRAVENOUS

## 2015-09-21 MED ORDER — BUPIVACAINE HCL (PF) 0.5 % IJ SOLN
INTRAMUSCULAR | Status: DC | PRN
  Administered 2015-09-21: 10 mL

## 2015-09-21 MED ORDER — LACTATED RINGERS IV SOLN
INTRAVENOUS | Status: DC
  Administered 2015-09-21: 08:00:00 via INTRAVENOUS

## 2015-09-21 MED ORDER — MIDAZOLAM HCL 2 MG/2ML IJ SOLN
1.0000 mg | INTRAMUSCULAR | Status: DC | PRN
  Administered 2015-09-21: 2 mg via INTRAVENOUS

## 2015-09-21 MED ORDER — FENTANYL CITRATE (PF) 100 MCG/2ML IJ SOLN: 25.0000 ug | INTRAMUSCULAR | Status: DC | PRN

## 2015-09-21 MED ORDER — PROPOFOL 10 MG/ML IV BOLUS
INTRAVENOUS | Status: AC
  Filled 2015-09-21: qty 20

## 2015-09-21 MED ORDER — LIDOCAINE HCL (PF) 0.5 % IJ SOLN
INTRAMUSCULAR | Status: AC
  Filled 2015-09-21: qty 50

## 2015-09-21 MED ORDER — CEFAZOLIN SODIUM-DEXTROSE 2-3 GM-% IV SOLR
2.0000 g | INTRAVENOUS | Status: AC
  Administered 2015-09-21: 2 g via INTRAVENOUS
  Filled 2015-09-21: qty 50

## 2015-09-21 MED ORDER — SODIUM CHLORIDE 0.9 % IR SOLN
Status: DC | PRN
  Administered 2015-09-21: 1000 mL

## 2015-09-21 MED ORDER — BUPIVACAINE IN DEXTROSE 0.75-8.25 % IT SOLN
INTRATHECAL | Status: AC
  Filled 2015-09-21: qty 2

## 2015-09-21 MED ORDER — LIDOCAINE HCL (CARDIAC) 10 MG/ML IV SOLN
INTRAVENOUS | Status: DC | PRN
  Administered 2015-09-21: 50 mg via INTRAVENOUS

## 2015-09-21 MED ORDER — MIDAZOLAM HCL 2 MG/2ML IJ SOLN
INTRAMUSCULAR | Status: AC
  Filled 2015-09-21: qty 2

## 2015-09-21 MED ORDER — FENTANYL CITRATE (PF) 100 MCG/2ML IJ SOLN
INTRAMUSCULAR | Status: AC
  Filled 2015-09-21: qty 2

## 2015-09-21 MED ORDER — BUPIVACAINE HCL (PF) 0.5 % IJ SOLN
INTRAMUSCULAR | Status: AC
  Filled 2015-09-21: qty 30

## 2015-09-21 MED ORDER — CHLORHEXIDINE GLUCONATE 4 % EX LIQD: 60.0000 mL | Freq: Once | CUTANEOUS | Status: DC

## 2015-09-21 MED ORDER — LIDOCAINE HCL (PF) 0.5 % IJ SOLN
INTRAMUSCULAR | Status: DC | PRN
  Administered 2015-09-21: 50 mL via INTRAVENOUS

## 2015-09-21 MED ORDER — ONDANSETRON HCL 4 MG/2ML IJ SOLN: 4.0000 mg | Freq: Once | INTRAMUSCULAR | Status: DC | PRN

## 2015-09-21 SURGICAL SUPPLY — 44 items
BAG HAMPER (MISCELLANEOUS) ×2 IMPLANT
BANDAGE ELASTIC 3 VELCRO ST LF (GAUZE/BANDAGES/DRESSINGS) ×2 IMPLANT
BANDAGE ELASTIC 4 VELCRO NS (GAUZE/BANDAGES/DRESSINGS) ×2 IMPLANT
BANDAGE ESMARK 4X12 BL STRL LF (DISPOSABLE) IMPLANT
BNDG COHESIVE 4X5 TAN STRL (GAUZE/BANDAGES/DRESSINGS) IMPLANT
BNDG ESMARK 4X12 BLUE STRL LF (DISPOSABLE)
BNDG GAUZE ELAST 4 BULKY (GAUZE/BANDAGES/DRESSINGS) ×2 IMPLANT
CHLORAPREP W/TINT 26ML (MISCELLANEOUS) ×2 IMPLANT
CLOTH BEACON ORANGE TIMEOUT ST (SAFETY) ×2 IMPLANT
COVER LIGHT HANDLE STERIS (MISCELLANEOUS) ×4 IMPLANT
CUFF TOURNIQUET SINGLE 18IN (TOURNIQUET CUFF) ×2 IMPLANT
DECANTER SPIKE VIAL GLASS SM (MISCELLANEOUS) ×2 IMPLANT
DRAPE PROXIMA HALF (DRAPES) ×2 IMPLANT
DRSG XEROFORM 1X8 (GAUZE/BANDAGES/DRESSINGS) ×2 IMPLANT
ELECT NEEDLE TIP 2.8 STRL (NEEDLE) ×2 IMPLANT
ELECT REM PT RETURN 9FT ADLT (ELECTROSURGICAL) ×2
ELECTRODE REM PT RTRN 9FT ADLT (ELECTROSURGICAL) ×1 IMPLANT
FORMALIN 10 PREFIL 120ML (MISCELLANEOUS) ×2 IMPLANT
GAUZE KERLIX 2  STERILE LF (GAUZE/BANDAGES/DRESSINGS) ×2 IMPLANT
GAUZE SPONGE 4X4 12PLY STRL (GAUZE/BANDAGES/DRESSINGS) ×2 IMPLANT
GLOVE BIO SURGEON STRL SZ7 (GLOVE) ×4 IMPLANT
GLOVE BIOGEL PI IND STRL 7.0 (GLOVE) ×4 IMPLANT
GLOVE BIOGEL PI INDICATOR 7.0 (GLOVE) ×4
GLOVE ECLIPSE 6.5 STRL STRAW (GLOVE) ×2 IMPLANT
GLOVE SKINSENSE NS SZ8.0 LF (GLOVE) ×1
GLOVE SKINSENSE STRL SZ8.0 LF (GLOVE) ×1 IMPLANT
GLOVE SS N UNI LF 8.5 STRL (GLOVE) ×2 IMPLANT
GOWN STRL REUS W/TWL LRG LVL3 (GOWN DISPOSABLE) ×4 IMPLANT
GOWN STRL REUS W/TWL XL LVL3 (GOWN DISPOSABLE) ×2 IMPLANT
KIT ROOM TURNOVER APOR (KITS) ×2 IMPLANT
MANIFOLD NEPTUNE II (INSTRUMENTS) ×2 IMPLANT
NEEDLE HYPO 21X1.5 SAFETY (NEEDLE) ×2 IMPLANT
NS IRRIG 1000ML POUR BTL (IV SOLUTION) ×2 IMPLANT
PACK BASIC LIMB (CUSTOM PROCEDURE TRAY) ×2 IMPLANT
PAD ARMBOARD 7.5X6 YLW CONV (MISCELLANEOUS) ×2 IMPLANT
SET BASIN LINEN APH (SET/KITS/TRAYS/PACK) ×2 IMPLANT
SPONGE GAUZE 4X4 12PLY (GAUZE/BANDAGES/DRESSINGS) ×2 IMPLANT
STAPLER VISISTAT (STAPLE) ×2 IMPLANT
STRIP CLOSURE SKIN 1/2X4 (GAUZE/BANDAGES/DRESSINGS) IMPLANT
SUT ETHILON 3 0 FSL (SUTURE) ×2 IMPLANT
SUT MON AB 2-0 SH 27 (SUTURE) ×1
SUT MON AB 2-0 SH27 (SUTURE) ×1 IMPLANT
SUT PROLENE 3 0 PS 2 (SUTURE) IMPLANT
SYR CONTROL 10ML LL (SYRINGE) ×2 IMPLANT

## 2015-09-21 NOTE — H&P (Signed)
Jordan [name] is an 70 y.o. female.     Chief Complaint: Mass right wrist   HPI: This is a 70 year old female with a mass on the volar aspect of the right wrist which is causing moderate to severe aching pain for the last 3 months with no associated neurovascular compromise  Review of systems negative  Past Medical History  Diagnosis Date  . Allergy     seasonal  . Blood transfusion 2007    no abnormal reation noted   . COPD (chronic obstructive pulmonary disease) ([place])   . Hyperlipidemia     hx of-was on meds but has been off x 1 1/2 yrs  . Osteoporosis     takes Fosamax weekly  . IBS (irritable bowel syndrome)   . C. difficile colitis     2007: treated with Flagyl and Vanc, 2014: treated with vanc, Aug 2014: Vanc taper  . Hemorrhoids   . Asthma     uses Advair and Spiriva daily,Albuterol prn.Takes Singulair at bedtime and Prednisone daily  . Back spasm     takes Zanaflex daily as needed  . Depression     takes Prozac daily  . Complication of anesthesia     woke up during one surgery  . Hypertension     takes Verapamil nightly  . Shortness of breath     with exertion daily  . Pneumonia     MRSA pneumonia in 2007  . History of bronchitis     last time a yr ago  . Tingling     and pain in left leg  . Chronic back pain   . Bursitis     left elbow  . Bruises easily     d/t being on Prednisone  . H/O hiatal hernia   . History of colon polyps   . Urinary frequency   . History of kidney stones   . Nocturia   . Insomnia     takes Benadryl as needed  . Myocardial infarction [facility]) 2007    Takotsubo cardiomyopathy    Past Surgical History  Procedure Laterality Date  . Appendectomy    . Gastric fundoplication      x 2  . Oophorectomy Right   . Neck surgery      fusion  . Cholecystectomy    . Knee arthroscopy Right   . Colonoscopy   09/24/2006    RMR: Normal rectum, left-sided diverticula  . Esophagogastroduodenoscopy  04/07/2006    BJY:NWGNFA esophagus s/p  placement of Bravo pH probe  . Colonoscopy  Oct 2012    Dr. Henrene Pastor: tubular adenomas, moderative diverticulosis, internal hemorrhoids  . Tonsillectomy    . Cataract surgery Bilateral   . Carpal tunnel release Left   . Hemorrhoidal banding    . Cystoscopy    . Lumbar laminectomy/decompression microdiscectomy Left 10/17/2013    Procedure: LUMBAR LAMINECTOMY/DECOMPRESSION MICRODISCECTOMY 1 LEVEL three/four;  Surgeon: Ophelia Charter, MD;  Location: [facility];  Service: Neurosurgery;  Laterality: Left;  . Hernia repair      umbilical  . Olecranon bursectomy Left 01/11/2014    Procedure: OLECRANON BURSECTOMY;  Surgeon: Carole Civil, MD;  Location: [facility];  Service: Orthopedics;  Laterality: Left;    Family History  Problem Relation Age of Onset  . Heart disease Father   . Colon cancer Neg Hx    Social History:  reports that she has never smoked. She has never used smokeless tobacco. She reports that she drinks alcohol.  She reports that she does not use illicit drugs.  Allergies:  Allergies  Allergen Reactions  . Cardura [Doxazosin Mesylate] Hives  . Other     Strawberry-hives  . Sulfa Antibiotics Nausea Only  . Barbiturates Nausea And Vomiting and Rash    Medications Prior to Admission  Medication Sig Dispense Refill  . albuterol (PROVENTIL HFA;VENTOLIN HFA) 108 (90 BASE) MCG/ACT inhaler Inhale 2 puffs into the lungs every 6 (six) hours as needed for wheezing.    Marland Kitchen alendronate (FOSAMAX) 70 MG tablet Take 70 mg by mouth every Thursday. Take with a full glass of water on an empty stomach.    . Cholecalciferol (VITAMIN D-3) 5000 UNITS TABS Take 5,000 Units by mouth daily.    . diazepam (VALIUM) 5 MG tablet Take 1 tablet (5 mg total) by mouth every 6 (six) hours as needed for muscle spasms. 50 tablet 1  . Eluxadoline (VIBERZI) 100 MG TABS Take 200 mg by mouth daily.    Marland Kitchen FLUoxetine (PROZAC) 40 MG capsule Take 40 mg by mouth daily.      . folic acid (FOLVITE) 1 MG tablet Take 1  mg by mouth daily.    Marland Kitchen HYDROcodone-acetaminophen (NORCO) 7.5-325 MG per tablet Take 1 tablet by mouth every 6 (six) hours as needed for moderate pain. 60 tablet 0  . lisinopril (PRINIVIL,ZESTRIL) 20 MG tablet Take 20 mg by mouth daily.    . mometasone-formoterol (DULERA) 100-5 MCG/ACT AERO Inhale 1 puff into the lungs 2 (two) times daily.    . montelukast (SINGULAIR) 10 MG tablet Take 10 mg by mouth at bedtime.      . predniSONE (DELTASONE) 10 MG tablet Take 5 mg by mouth daily.     . Probiotic Product (PROBIOTIC PO) Take 1 tablet by mouth every morning.    . tiotropium (SPIRIVA) 18 MCG inhalation capsule Place 18 mcg into inhaler and inhale daily.      . verapamil (CALAN-SR) 120 MG CR tablet Take 120 mg by mouth daily.     . diclofenac (CATAFLAM) 50 MG tablet Take 1 tablet (50 mg total) by mouth 2 (two) times daily. (Patient not taking: Reported on 09/14/2015) 60 tablet 1  . HYDROcodone-acetaminophen (NORCO) 7.5-325 MG tablet Take 1 tablet by mouth every 6 (six) hours as needed for moderate pain. (Patient not taking: Reported on 09/14/2015) 120 tablet 0    Results for orders placed or performed during the hospital encounter of 09/19/15 (from the past 48 hour(s))  CBC with Differential/Platelet     Status: Abnormal   Collection Time: 09/19/15  3:00 PM  Result Value Ref Range   WBC 7.7 4.0 - 10.5 K/uL   RBC 3.09 (L) 3.87 - 5.11 MIL/uL   Hemoglobin 9.6 (L) 12.0 - 15.0 g/dL   HCT 29.1 (L) 36.0 - 46.0 %   MCV 94.2 78.0 - 100.0 fL   MCH 31.1 26.0 - 34.0 pg   MCHC 33.0 30.0 - 36.0 g/dL   RDW 13.1 11.5 - 15.5 %   Platelets 224 150 - 400 K/uL   Neutrophils Relative % 81 %   Neutro Abs 6.3 1.7 - 7.7 K/uL   Lymphocytes Relative 8 %   Lymphs Abs 0.6 (L) 0.7 - 4.0 K/uL   Monocytes Relative 7 %   Monocytes Absolute 0.6 0.1 - 1.0 K/uL   Eosinophils Relative 3 %   Eosinophils Absolute 0.2 0.0 - 0.7 K/uL   Basophils Relative 1 %   Basophils Absolute 0.1 0.0 - 0.1 K/uL  Basic metabolic panel      Status: Abnormal   Collection Time: 09/19/15  3:00 PM  Result Value Ref Range   Sodium 139 135 - 145 mmol/L   Potassium 5.0 3.5 - 5.1 mmol/L   Chloride 108 101 - 111 mmol/L   CO2 23 22 - 32 mmol/L   Glucose, Bld 81 65 - 99 mg/dL   BUN 21 (H) 6 - 20 mg/dL   Creatinine, Ser 1.52 (H) 0.44 - 1.00 mg/dL   Calcium 9.6 8.9 - 10.3 mg/dL   GFR calc non Af Amer 34 (L) >60 mL/min   GFR calc Af Amer 39 (L) >60 mL/min    Comment: (NOTE) The eGFR has been calculated using the CKD EPI equation. This calculation has not been validated in all clinical situations. eGFR's persistently <60 mL/min signify possible Chronic Kidney Disease.    Anion gap 8 5 - 15   US Soft Tissue Head/neck  09/20/2015  CLINICAL DATA:  Thyromegaly on physical exam EXAM: THYROID ULTRASOUND TECHNIQUE: Ultrasound examination of the thyroid gland and adjacent soft tissues was performed. COMPARISON:  None. FINDINGS: Right thyroid lobe Measurements: 45 x 13 x 16 mm. No nodules visualized. Moderate diffuse hyperemia. Left thyroid lobe Measurements: 48 x14 x 12 mm.  Hyperemic.  No nodules visualized. Isthmus Thickness: 4 mm.  No nodules visualized. Lymphadenopathy None visualized. IMPRESSION: 1. Hyperemic, normal-sized thyroid without nodule or other focal lesion. Electronically Signed   By: Lucrezia Europe M.D.   On: 09/20/2015 15:38   US Carotid Bilateral  09/20/2015  CLINICAL DATA:  Atherosclerosis, hypertension, coronary artery disease, and dizziness, visual disturbance. EXAM: BILATERAL CAROTID DUPLEX ULTRASOUND TECHNIQUE: Pearline Cables scale imaging, color Doppler and duplex ultrasound was performed of bilateral carotid and vertebral arteries in the neck. COMPARISON:  None. REVIEW OF SYSTEMS: Quantification of carotid stenosis is based on velocity parameters that correlate the residual internal carotid diameter with NASCET-based stenosis levels, using the diameter of the distal internal carotid lumen as the denominator for stenosis measurement. The  following velocity measurements were obtained: PEAK SYSTOLIC/END DIASTOLIC RIGHT ICA:                     141/36cm/sec CCA:                     72/53GU/YQI SYSTOLIC ICA/CCA RATIO:  2.3 DIASTOLIC ICA/CCA RATIO: 2.4 ECA:                     205cm/sec LEFT ICA:                     214/55cm/sec CCA:                     347/42VZ/DGL SYSTOLIC ICA/CCA RATIO:  1.8 DIASTOLIC ICA/CCA RATIO: 1.9 ECA:  205 Cm/sec FINDINGS: RIGHT CAROTID ARTERY: Eccentric partially calcified plaque scattered through the common carotid artery without significant stenosis. There is more irregular partially calcified plaque in the carotid bulb over short segment, extending to the origin of the ICA. Dramatically elevated peak systolic velocities at the distal margin of the plaque in the distal bulb. Associated focal aliasing on color Doppler. Somewhat delayed systolic upstroke in the more distal ICA. Distal ICA mildly tortuous. RIGHT VERTEBRAL ARTERY:  Normal flow direction and waveform. LEFT CAROTID ARTERY: Eccentric partially calcified plaque through the common carotid artery. There is eccentric partially calcified plaque effacing the carotid bulb with elevated peak systolic velocities in the proximal  ICA just distal to this lesion. The calcification results in some areas of distal acoustic shadowing. The distal ICA is tortuous. LEFT VERTEBRAL ARTERY: Normal flow direction and waveform. IMPRESSION: 1. 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 2. Right carotid bifurcation and proximal ICA plaque with severely elevated velocities in the distal bulb and abnormal distal waveforms, suggesting high-grade possibly near-occlusive stenosis. Consider vascular surgical or interventional radiology consultation. Electronically Signed   By: Lucrezia Europe M.D.   On: 09/20/2015 16:19    Review of Systems  All other systems reviewed and are negative.   Temperature 98 F (36.7 C), temperature source Oral. Physical  Exam  The patient's general appearance is normal she has a small frame She is retired 3 Mood is pleasant Right wrist and upper extremity she has a 3 x 2 cm volar mass near the radial artery which is nonpulsatile. 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

## 2015-09-21 NOTE — Op Note (Signed)
Operative report today's date 09/21/2015  History this is a 70 year old female presented with a volar wrist ganglion that was painful for her and she wanted it removed to relieve pain  Preoperative diagnosis volar wrist ganglion right upper extremity Postop diagnosis same Procedure excision of volar wrist ganglion and carpal tunnel release. (The carpal tunnel release was done as part of the dissection to remove the ganglion)  Surgeon Aline Brochure  Anesthetic regional's be her block  Operative findings large volar wrist ganglion coming from the floor of the upper extremity carpal and wrist joint several tendons were entrapped around the capsule of the ganglion, radial artery not involved other than mass effect and displacement of the artery  Assisted by Simonne Maffucci  Details of procedure  First went in to find the patient in the preoperative area and then confirm the surgical site worked it reviewed the chart and updated it  She was taken to surgery for.  Block  The right upper extremity was prepped and draped sterilely. Block was already established  Time out was executed  A longitudinal incision was made in the approach of Mallie Mussel on the volar aspect of the wrist but dissection was carried out protecting the radial artery and tendons as well as median nerve which were all entrapped in this mass and capsule.  We went to the floor of the carpus and joint of the radiocarpal joint and the ganglion was excised hemostasis was obtained and a carpal tunnel release was done during the dissection to obtain full visualization and protection of the median nerve.  Irrigation and closure with 2-0 Monocryl and staples injection with 10 mL plain Marcaine  Sterile dressing applied  Tourniquet released capillary refill excellent color good  Patient taken to recovery room stable condition

## 2015-09-21 NOTE — Anesthesia Procedure Notes (Signed)
Procedure Name: MAC Date/Time: 09/21/2015 8:24 AM Performed by: Vista Deck Pre-anesthesia Checklist: Patient identified, Emergency Drugs available, Suction available, Timeout performed and Patient being monitored Patient Re-evaluated:Patient Re-evaluated prior to inductionOxygen Delivery Method: Nasal Cannula    Anesthesia Regional Block:  Bier block (IV Regional)  Pre-Anesthetic Checklist: ,, timeout performed, Correct Patient, Correct Site, Correct Laterality, Correct Procedure,, site marked, surgical consent,, at surgeon's request  Laterality: Right     Needles:  Injection technique: Single-shot  Needle Type: Other      Needle Gauge: 22 and 22 G    Additional Needles: Bier block (IV Regional)  Nerve Stimulator or Paresthesia:   Additional Responses:  Pulse checked post tourniquet inflation. IV NSL discontinued post injection. Narrative:  Start time: 09/21/2015 8:32 AM End time: 09/21/2015 8:35 AM  Performed by: Personally

## 2015-09-21 NOTE — Interval H&P Note (Signed)
History and Physical Interval Note:  09/21/2015 8:17 AM  Jordan [name]  has presented today for surgery, with the diagnosis of right volar ganglion cyst  The various methods of treatment have been discussed with the patient and family. After consideration of risks, benefits and other options for treatment, the patient has consented to  Procedure(s): REMOVAL GANGLION OF WRIST (Right) (volar) as a surgical intervention .  The patient's history has been reviewed, patient examined, no change in status, stable for surgery.  I have reviewed the patient's chart and labs.  Questions were answered to the patient's satisfaction.     Arther Abbott

## 2015-09-21 NOTE — Discharge Instructions (Signed)
PATIENT INSTRUCTIONS POST-ANESTHESIA  IMMEDIATELY FOLLOWING SURGERY:  Do not drive or operate machinery for the first twenty four hours after surgery.  Do not make any important decisions for twenty four hours after surgery or while taking narcotic pain medications or sedatives.  If you develop intractable nausea and vomiting or a severe headache please notify your doctor immediately.  FOLLOW-UP:  Please make an appointment with your surgeon as instructed. You do not need to follow up with anesthesia unless specifically instructed to do so.  WOUND CARE INSTRUCTIONS (if applicable):  Keep a dry clean dressing on the anesthesia/puncture wound site if there is drainage.  Once the wound has quit draining you may leave it open to air.  Generally you should leave the bandage intact for twenty four hours unless there is drainage.  If the epidural site drains for more than 36-48 hours please call the anesthesia department.  QUESTIONS?:  Please feel free to call your physician or the hospital operator if you have any questions, and they will be happy to assist you.         Ganglion Cyst A ganglion cyst is a noncancerous, fluid-filled lump that occurs near joints or tendons. The ganglion cyst grows out of a joint or the lining of a tendon. It most often develops in the hand or wrist, but it can also develop in the shoulder, elbow, hip, knee, ankle, or foot. The round or oval ganglion cyst can be the size of a pea or larger than a grape. Increased activity may enlarge the size of the cyst because more fluid starts to build up.  CAUSES It is not known what causes a ganglion cyst to grow. However, it may be related to:  Inflammation or irritation around the joint.  An injury.  Repetitive movements or overuse.  Arthritis. RISK FACTORS Risk factors include:  Being a woman.  Being age 64-50. SIGNS AND SYMPTOMS Symptoms may include:   A lump. This most often appears on the hand or wrist, but it  can occur in other areas of the body.  Tingling.  Pain.  Numbness.  Muscle weakness.  Weak grip.  Less movement in a joint. DIAGNOSIS Ganglion cysts are most often diagnosed based on a physical exam. Your health care provider will feel the lump and may shine a light alongside it. If it is a ganglion cyst, a light often shines through it. Your health care provider may order an X-ray, ultrasound, or MRI to rule out other conditions. TREATMENT Ganglion cysts usually go away on their own without treatment. If pain or other symptoms are involved, treatment may be needed. Treatment is also needed if the ganglion cyst limits your movement or if it gets infected. Treatment may include:  Wearing a brace or splint on your wrist or finger.  Taking anti-inflammatory medicine.  Draining fluid from the lump with a needle (aspiration).  Injecting a steroid into the joint.  Surgery to remove the ganglion cyst. HOME CARE INSTRUCTIONS  Do not press on the ganglion cyst, poke it with a needle, or hit it.  Take medicines only as directed by your health care provider.  Wear your brace or splint as directed by your health care provider.  Watch your ganglion cyst for any changes.  Keep all follow-up visits as directed by your health care provider. This is important. SEEK MEDICAL CARE IF:  Your ganglion cyst becomes larger or more painful.  You have increased redness, red streaks, or swelling.  You have pus  coming from the lump.  You have weakness or numbness in the affected area.  You have a fever or chills.   This information is not intended to replace advice given to you by your health care provider. Make sure you discuss any questions you have with your health care provider.   Document Released: 08/22/2000 Document Revised: 09/15/2014 Document Reviewed: 02/07/2014 Elsevier Interactive Patient Education 2016 [place] An incision is when a surgeon cuts into your  body. After surgery, the incision needs to be cared for properly to prevent infection.  HOW TO CARE FOR YOUR INCISION  Take medicines only as directed by your health care provider.  There are many different ways to close and cover an incision, including stitches, skin glue, and adhesive strips. Follow your health care provider's instructions on:  Incision care.  Bandage (dressing) changes and removal.  Incision closure removal.  Do not take baths, swim, or use a hot tub until your health care provider approves. You may shower as directed by your health care provider.  Resume your normal diet and activities as directed.  Use anti-itch medicine (such as an antihistamine) as directed by your health care provider. The incision may itch while it is healing. Do not pick or scratch at the incision.  Drink enough fluid to keep your urine clear or pale yellow. SEEK MEDICAL CARE IF:   You have drainage, redness, swelling, or pain at your incision site.  You have muscle aches, chills, or a general ill feeling.  You notice a bad smell coming from the incision or dressing.  Your incision edges separate after the sutures, staples, or skin adhesive strips have been removed.  You have persistent nausea or vomiting.  You have a fever.  You are dizzy. SEEK IMMEDIATE MEDICAL CARE IF:   You have a rash.  You faint.  You have difficulty breathing. MAKE SURE YOU:   Understand these instructions.  Will watch your condition.  Will get help right away if you are not doing well or get worse.   This information is not intended to replace advice given to you by your health care provider. Make sure you discuss any questions you have with your health care provider.   Document Released: 03/14/2005 Document Revised: 09/15/2014 Document Reviewed: 10/19/2013 Elsevier Interactive Patient Education [employer].

## 2015-09-21 NOTE — Transfer of Care (Addendum)
Immediate Anesthesia Transfer of Care Note  Patient: Jordan [name]  Procedure(s) Performed: Procedure(s): RIGHT WRIST GANGLION CYST REMOVAL (Right) CARPAL TUNNEL RELEASE (Right)  Patient Location: PACU  Anesthesia Type:Regional  Level of Consciousness: awake and patient cooperative  Airway & Oxygen Therapy: Non rebreather Spontaneous   Post-op Assessment: Report given to RN, Post -op Vital signs reviewed and stable and Patient moving all extremities  Post vital signs: Reviewed and stable    Complications: No apparent anesthesia complications

## 2015-09-21 NOTE — Anesthesia Postprocedure Evaluation (Signed)
Anesthesia Post Note  Patient: Jordan [name]  Procedure(s) Performed: Procedure(s) (LRB): RIGHT WRIST GANGLION CYST REMOVAL (Right) CARPAL TUNNEL RELEASE (Right)  Patient location during evaluation: PACU Anesthesia Type: Bier Block Level of consciousness: awake and alert and patient cooperative Pain management: pain level controlled Vital Signs Assessment: post-procedure vital signs reviewed and stable Respiratory status: spontaneous breathing and nonlabored ventilation Cardiovascular status: blood pressure returned to baseline Postop Assessment: no signs of nausea or vomiting Anesthetic complications: no    Last Vitals:  Filed Vitals:   09/21/15 0815 09/21/15 0926  BP: 137/65   Pulse:  59  Temp:  36.6 C  Resp: 29 18    Last Pain: There were no vitals filed for this visit.               Tanairi Cypert J

## 2015-09-21 NOTE — Brief Op Note (Signed)
09/21/2015  9:22 AM  PATIENT:  [name]  70 y.o. female  PRE-OPERATIVE DIAGNOSIS:  right volar ganglion cyst  POST-OPERATIVE DIAGNOSIS:  right volar ganglion cyst,   PROCEDURE:  Procedure(s): RIGHT WRIST GANGLION CYST REMOVAL (Right) CARPAL TUNNEL RELEASE (Right)  SURGEON:  Surgeon(s) and Role:    * Carole Civil, MD - Primary  PHYSICIAN ASSISTANT:   ASSISTANTS: BETTY ASHLEY   ANESTHESIA:   regional  EBL:  Total I/O In: 500 [I.V.:500] Out: 0   BLOOD ADMINISTERED:none  DRAINS: none   LOCAL MEDICATIONS USED:  MARCAINE     SPECIMEN:  Source of Specimen:  RIGHT VOLAR WRIST GANGLION  DISPOSITION OF SPECIMEN:  PATHOLOGY  COUNTS:  YES  TOURNIQUET:   Total Tourniquet Time Documented: Upper Arm (Right) - 44 minutes Total: Upper Arm (Right) - 44 minutes   DICTATION: .Viviann Spare Dictation  PLAN OF CARE: Discharge to home after PACU  PATIENT DISPOSITION:  PACU - hemodynamically stable.   Delay start of Pharmacological VTE agent (>24hrs) due to surgical blood loss or risk of bleeding: not applicable

## 2015-09-21 NOTE — Anesthesia Preprocedure Evaluation (Signed)
Anesthesia Evaluation  Patient identified by MRN, date of birth, ID band Patient awake    Reviewed: Allergy & Precautions, NPO status , Patient's Chart, lab work & pertinent test results  Airway Mallampati: II  TM Distance: >3 FB     Dental  (+) Teeth Intact, Missing, Poor Dentition,    Pulmonary shortness of breath and with exertion, asthma , pneumonia, resolved, COPD,  COPD inhaler,    Pulmonary exam normal        Cardiovascular hypertension, Pt. on medications + Past MI and + DOE  Normal cardiovascular exam+ dysrhythmias      Neuro/Psych Anxiety Depression    GI/Hepatic hiatal hernia,   Endo/Other    Renal/GU Renal InsufficiencyRenal disease     Musculoskeletal   Abdominal Normal abdominal exam  (+)   Peds  Hematology  (+) anemia ,   Anesthesia Other Findings   Reproductive/Obstetrics                             Anesthesia Physical Anesthesia Plan  ASA: III  Anesthesia Plan: Bier Block   Post-op Pain Management:    Induction: Intravenous  Airway Management Planned: Nasal Cannula  Additional Equipment:   Intra-op Plan:   Post-operative Plan:   Informed Consent: I have reviewed the patients History and Physical, chart, labs and discussed the procedure including the risks, benefits and alternatives for the proposed anesthesia with the patient or authorized representative who has indicated his/her understanding and acceptance.   Dental advisory given  Plan Discussed with: CRNA  Anesthesia Plan Comments:         Anesthesia Quick Evaluation

## 2015-09-24 ENCOUNTER — Encounter (HOSPITAL_COMMUNITY): Payer: Self-pay | Admitting: Orthopedic Surgery

## 2015-09-24 ENCOUNTER — Ambulatory Visit (INDEPENDENT_AMBULATORY_CARE_PROVIDER_SITE_OTHER): Payer: Self-pay | Admitting: Orthopedic Surgery

## 2015-09-24 VITALS — BP 147/85 | Ht 63.0 in | Wt 128.0 lb

## 2015-09-24 DIAGNOSIS — Z4789 Encounter for other orthopedic aftercare: Secondary | ICD-10-CM

## 2015-09-24 DIAGNOSIS — R2231 Localized swelling, mass and lump, right upper limb: Secondary | ICD-10-CM

## 2015-09-24 NOTE — Progress Notes (Signed)
Patient ID: Jordan [name], female   DOB: 05/24/1946, 70 y.o.   MRN: [id number]  Follow up visit  Chief Complaint  Patient presents with  . Follow-up    Post op #1, right wrist, ganglion cyst removal. DOS 09-21-15.    BP 147/85 mmHg  Ht 5\' 3"  (1.6 m)  Wt 128 lb (58.06 kg)  BMI 22.68 kg/m2  Encounter Diagnoses  Name Primary?  . Mass of hand, right   . Surgical aftercare, musculoskeletal system Yes   Wound check. Staples are clean dry and intact wound strength clean dry and intact neurovascular exams intact radial artery pulsatile intact capillary refill normal  Come back for staples out postop day 10-14

## 2015-10-03 ENCOUNTER — Ambulatory Visit (INDEPENDENT_AMBULATORY_CARE_PROVIDER_SITE_OTHER): Payer: Self-pay | Admitting: Orthopedic Surgery

## 2015-10-03 VITALS — BP 156/79 | Ht 63.0 in | Wt 128.0 lb

## 2015-10-03 DIAGNOSIS — Z4789 Encounter for other orthopedic aftercare: Secondary | ICD-10-CM

## 2015-10-03 NOTE — Progress Notes (Signed)
Patient in today for nurse visit staple removal post ganglion cyst removal of right wrist 09/21/15. No signs of infection. No odor or drainage, some redness present toward palm on both sides of staple line. Patient voices no complaints. Tolerated procedure well.

## 2015-10-17 ENCOUNTER — Other Ambulatory Visit (HOSPITAL_COMMUNITY): Payer: Self-pay | Admitting: Pulmonary Disease

## 2015-10-17 DIAGNOSIS — N186 End stage renal disease: Principal | ICD-10-CM

## 2015-10-17 DIAGNOSIS — I120 Hypertensive chronic kidney disease with stage 5 chronic kidney disease or end stage renal disease: Secondary | ICD-10-CM

## 2015-10-24 ENCOUNTER — Ambulatory Visit (HOSPITAL_COMMUNITY)
Admission: RE | Admit: 2015-10-24 | Discharge: 2015-10-24 | Disposition: A | Discharge status: Home / Self Care | Payer: Medicare Other | Source: Ambulatory Visit | Attending: Pulmonary Disease | Admitting: Pulmonary Disease

## 2015-10-24 DIAGNOSIS — N186 End stage renal disease: Secondary | ICD-10-CM | POA: Diagnosis not present

## 2015-10-24 DIAGNOSIS — I120 Hypertensive chronic kidney disease with stage 5 chronic kidney disease or end stage renal disease: Secondary | ICD-10-CM | POA: Diagnosis present

## 2015-11-02 ENCOUNTER — Ambulatory Visit (INDEPENDENT_AMBULATORY_CARE_PROVIDER_SITE_OTHER): Payer: Self-pay | Admitting: Orthopedic Surgery

## 2015-11-02 VITALS — BP 168/78 | Ht 63.0 in | Wt 128.0 lb

## 2015-11-02 DIAGNOSIS — Z4789 Encounter for other orthopedic aftercare: Secondary | ICD-10-CM

## 2015-11-02 DIAGNOSIS — M25561 Pain in right knee: Secondary | ICD-10-CM

## 2015-11-02 DIAGNOSIS — R2231 Localized swelling, mass and lump, right upper limb: Secondary | ICD-10-CM

## 2015-11-02 MED ORDER — HYDROCODONE-ACETAMINOPHEN 7.5-325 MG PO TABS: 1.0000 | ORAL_TABLET | Freq: Four times a day (QID) | ORAL | Status: DC | PRN

## 2015-11-02 NOTE — Progress Notes (Signed)
Patient ID: Jordan [name], female   DOB: 1946/07/14, 70 y.o.   MRN: [id number]  Chief Complaint  Patient presents with  . Follow-up    Post op, right wrist, CTR and cyst removal, DOS 09-21-15.    BP 168/78 mmHg  Ht 5\' 3"  (1.6 m)  Wt 128 lb (58.06 kg)  BMI 22.68 kg/m2  Volar aspect of the right wrist looks great. No problems with the wrist. She would like some medicine for her knee    ASSESSMENT AND PLAN   I refilled her Norco for her knee she says she wants to come back for an injection in about a month  Meds ordered this encounter  Medications  . HYDROcodone-acetaminophen (NORCO) 7.5-325 MG tablet    Sig: Take 1 tablet by mouth every 6 (six) hours as needed for moderate pain.    Dispense:  120 tablet    Refill:  0    For knee pain

## 2015-11-20 ENCOUNTER — Encounter: Payer: Medicare Other | Admitting: Vascular Surgery

## 2015-11-21 ENCOUNTER — Encounter: Payer: Self-pay | Admitting: Vascular Surgery

## 2015-11-27 ENCOUNTER — Encounter: Payer: Self-pay | Admitting: Vascular Surgery

## 2015-11-27 ENCOUNTER — Ambulatory Visit (HOSPITAL_COMMUNITY)
Admission: RE | Admit: 2015-11-27 | Discharge: 2015-11-27 | Disposition: A | Discharge status: Home / Self Care | Payer: Medicare Other | Source: Ambulatory Visit | Attending: Vascular Surgery | Admitting: Vascular Surgery

## 2015-11-27 ENCOUNTER — Ambulatory Visit (INDEPENDENT_AMBULATORY_CARE_PROVIDER_SITE_OTHER): Payer: Medicare Other | Admitting: Vascular Surgery

## 2015-11-27 VITALS — BP 135/70 | HR 94 | Temp 98.0°F | Resp 16 | Ht 63.0 in | Wt 130.0 lb

## 2015-11-27 DIAGNOSIS — I10 Essential (primary) hypertension: Secondary | ICD-10-CM | POA: Diagnosis not present

## 2015-11-27 DIAGNOSIS — I6529 Occlusion and stenosis of unspecified carotid artery: Secondary | ICD-10-CM | POA: Diagnosis not present

## 2015-11-27 DIAGNOSIS — I6521 Occlusion and stenosis of right carotid artery: Secondary | ICD-10-CM

## 2015-11-27 DIAGNOSIS — E785 Hyperlipidemia, unspecified: Secondary | ICD-10-CM | POA: Diagnosis not present

## 2015-11-27 DIAGNOSIS — Z01810 Encounter for preprocedural cardiovascular examination: Secondary | ICD-10-CM

## 2015-11-27 NOTE — Progress Notes (Signed)
Subjective:     Patient ID: [name], female   DOB: Sep 14, 1945, 70 y.o.   MRN: [id number]  HPI  This 70 year old female was referred by Dr. At Luan Pulling for evaluation of severe right carotid occlusive disease. Patient has no history of stroke. She does have a history of transient left upper extremity clumsiness which occurred about 6-8 weeks ago and then resolved after a few minutes. She has never had this type of symptom before or since. She has no history of amaurosis fugax, a phase a, syncope, or other neurologic abnormalities. Patient does have a remote history of pneumonia with MRSA and required transfer to [facility] and was hospitalized for 4 weeks. She did completely recover. She also has COPD-asthma. She has a remote history of myocardial infarction 10 years ago but has been asymptomatic since that time.  Past Medical History  Diagnosis Date  . Allergy     seasonal  . Blood transfusion 2007    no abnormal reation noted   . COPD (chronic obstructive pulmonary disease) ([place])   . Hyperlipidemia     hx of-was on meds but has been off x 1 1/2 yrs  . Osteoporosis     takes Fosamax weekly  . IBS (irritable bowel syndrome)   . C. difficile colitis     2007: treated with Flagyl and Vanc, 2014: treated with vanc, Aug 2014: Vanc taper  . Hemorrhoids   . Asthma     uses Advair and Spiriva daily,Albuterol prn.Takes Singulair at bedtime and Prednisone daily  . Back spasm     takes Zanaflex daily as needed  . Depression     takes Prozac daily  . Complication of anesthesia     woke up during one surgery  . Hypertension     takes Verapamil nightly  . Shortness of breath     with exertion daily  . Pneumonia     MRSA pneumonia in 2007  . History of bronchitis     last time a yr ago  . Tingling     and pain in left leg  . Chronic back pain   . Bursitis     left elbow  . Bruises easily     d/t being on Prednisone  . H/O hiatal hernia   . History of colon polyps   . Urinary  frequency   . History of kidney stones   . Nocturia   . Insomnia     takes Benadryl as needed  . 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 Smoking status: Never Smoker   . Smokeless tobacco: Never Used  . Alcohol Use: 0.0 oz/week     Comment: couple of beers daily    Family History  Problem Relation Age of Onset  . Heart disease Father   . Colon cancer Neg Hx     Allergies  Allergen Reactions  . Cardura [Doxazosin Mesylate] Hives  . Other     Strawberry-hives  . Sulfa Antibiotics Nausea Only  . Barbiturates Nausea And Vomiting and Rash     Current outpatient prescriptions:  .  albuterol (PROVENTIL HFA;VENTOLIN HFA) 108 (90 BASE) MCG/ACT inhaler, Inhale 2 puffs into the lungs every 6 (six) hours as needed for wheezing., Disp: , Rfl:  .  alendronate (FOSAMAX) 70 MG tablet, Take 70 mg by mouth every Thursday. Take with a full glass of water on an empty stomach., Disp: , Rfl:  .  Cholecalciferol (VITAMIN D-3) 5000 UNITS TABS, Take 5,000 Units by mouth daily., Disp: , Rfl:  .  diazepam (VALIUM) 5 MG tablet, Take 1 tablet (5 mg total) by mouth every 6 (six) hours as needed for muscle spasms., Disp: 50 tablet, Rfl: 1 .  Eluxadoline (VIBERZI) 100 MG TABS, Take 200 mg by mouth daily., Disp: , Rfl:  .  FLUoxetine (PROZAC) 40 MG capsule, Take 40 mg by mouth daily.  , Disp: , Rfl:  .  folic acid (FOLVITE) 1 MG tablet, Take 1 mg by mouth daily., Disp: , Rfl:  .  HYDROcodone-acetaminophen (NORCO) 7.5-325 MG per tablet, Take 1 tablet by mouth every 6 (six) hours as needed for moderate pain., Disp: 60 tablet, Rfl: 0 .  HYDROcodone-acetaminophen (NORCO) 7.5-325 MG tablet, Take 1 tablet by mouth every 6 (six) hours as needed for moderate pain., Disp: 120 tablet, Rfl: 0 .  montelukast (SINGULAIR) 10 MG tablet, Take 10 mg by mouth at bedtime.  , Disp: , Rfl:  .  predniSONE (DELTASONE) 10 MG tablet, Take 5 mg by mouth daily. , Disp:  , Rfl:  .  Probiotic Product (PROBIOTIC PO), Take 1 tablet by mouth every morning., Disp: , Rfl:  .  verapamil (CALAN-SR) 120 MG CR tablet, Take 120 mg by mouth daily. , Disp: , Rfl:  .  diclofenac (CATAFLAM) 50 MG tablet, Take 1 tablet (50 mg total) by mouth 2 (two) times daily. (Patient not taking: Reported on 09/14/2015), Disp: 60 tablet, Rfl: 1 .  lisinopril (PRINIVIL,ZESTRIL) 20 MG tablet, Take 20 mg by mouth daily. Reported on 11/27/2015, Disp: , Rfl:  .  mometasone-formoterol (DULERA) 100-5 MCG/ACT AERO, Inhale 1 puff into the lungs 2 (two) times daily. Reported on 11/27/2015, Disp: , Rfl:  .  tiotropium (SPIRIVA) 18 MCG inhalation capsule, Place 18 mcg into inhaler and inhale daily. Reported on 11/27/2015, Disp: , Rfl:   Filed Vitals:   11/27/15 1405  BP: 135/70  Pulse: 94  Temp: 98 F (36.7 C)  Resp: 16  Height: 5\' 3"  (1.6 m)  Weight: 130 lb (58.968 kg)  SpO2: 98%    Body mass index is 23.03 kg/(m^2).          Review of Systems COPD with occasional wheezing -on chronic steroid therapy. Currently takes prednisone 5 mg per day. Has had no recent hospitalizations for pneumonia  Denies chest pain, claudication, lower extremity infection. Patient does have dyspnea on exertion which limits her ambulation. See history of present illness regarding history of myocardial infarction and MRSA pneumonia in the past. Otherwise all systems negative for complete review of systems     Objective:   Physical Exam BP 135/70 mmHg  Pulse 94  Temp(Src) 98 F (36.7 C)  Resp 16  Ht 5\' 3"  (1.6 m)  Wt 130 lb (58.968 kg)  BMI 23.03 kg/m2  SpO2 98%  Gen.-alert and oriented x3 in no apparent distress HEENT normal for age Lungs no rhonchi or wheezing Cardiovascular regular rhythm no murmurs carotid pulses 3+ palpable   Bilateral harsh carotid bruits Abdomen soft nontender no palpable masses Musculoskeletal free of  major deformities Skin clear -no rashes Neurologic normal Lower extremities  3+ femoral and dorsalis pedis pulses palpable bilaterally with no edema   Today I reviewed the records supplied by Dr. Luan Pulling. Also reviewed the report of the carotid duplex exam performed at [facility] on 09/20/2015. The report states that there is a severe right ICA stenosis but the velocity values in the  report are not consistent with that. Therefore I  Ordered a repeat carotid duplex exam on the right today which I reviewed and interpreted Patient has a high-grade 95% right ICA stenosis with peak systolic velocity of over 500 cm/s. Patient has a moderate left ICA stenosis approximating 60%.      Assessment:      severe-95% right ICA stenosis with recent history of right brain TIA consisting of transient left upper extremity clumsiness-resolved  COPD with asthma an history of MRSA pneumonia 10 years ago requiring hospitalization at [facility] for 4 weeks  coronary artery disease with remote history of myocardial infarction-currently asymptomatic     Plan:      plan we'll obtain nuclear medicine Cardiolite study to rule out cardiac ischemia  -if this is abnormal we will obtain cardiology consult  patient scheduled for right carotid endarterectomy on Wednesday, April 5  Patient is on daily prednisone therapy at 5 mg per day  Risks and benefits of carotid surgery thoroughly discussed the patient would like to proceed

## 2015-11-28 ENCOUNTER — Other Ambulatory Visit: Payer: Self-pay

## 2015-11-28 ENCOUNTER — Telehealth: Payer: Self-pay | Admitting: Vascular Surgery

## 2015-11-28 NOTE — Telephone Encounter (Signed)
notified patient of cardiolyte scheduled on 12-04-15 at 7:45

## 2015-11-28 NOTE — Addendum Note (Signed)
Addended by: Mena Goes on: 11/28/2015 11:12 AM   Modules accepted: Orders

## 2015-11-29 ENCOUNTER — Telehealth (HOSPITAL_COMMUNITY): Payer: Self-pay | Admitting: *Deleted

## 2015-11-29 NOTE — Telephone Encounter (Signed)
Left message on voicemail in reference to upcoming appointment scheduled for 12/04/15. Phone number given for a call back so details instructions can be given. Rock Sobol, [place]anae [place]

## 2015-11-29 NOTE — Telephone Encounter (Signed)
Patient given detailed instructions per Myocardial Perfusion Study Information Sheet for the test on 12/04/15 at 0745. Patient notified to arrive 15 minutes early and that it is imperative to arrive on time for appointment to keep from having the test rescheduled.  If you need to cancel or reschedule your appointment, please call the office within 24 hours of your appointment. Failure to do so may result in a cancellation of your appointment, and a $50 no show fee. Patient verbalized understanding.Ryoma Nofziger, Ranae [place]ms

## 2015-11-29 NOTE — Telephone Encounter (Signed)
Patient given detailed instructions per Myocardial Perfusion Study Information Sheet for the test on 12/04/15 at 0745. Patient notified to arrive 15 minutes early and that it is imperative to arrive on time for appointment to keep from having the test rescheduled.  If you need to cancel or reschedule your appointment, please call the office within 24 hours of your appointment. Failure to do so may result in a cancellation of your appointment, and a $50 no show fee. Patient verbalized understanding.Claribel Sachs, Ranae [place]ms

## 2015-12-04 ENCOUNTER — Ambulatory Visit (HOSPITAL_COMMUNITY): Payer: Medicare Other | Attending: Cardiology

## 2015-12-04 DIAGNOSIS — R0602 Shortness of breath: Secondary | ICD-10-CM | POA: Insufficient documentation

## 2015-12-04 DIAGNOSIS — I4510 Unspecified right bundle-branch block: Secondary | ICD-10-CM | POA: Insufficient documentation

## 2015-12-04 DIAGNOSIS — I6521 Occlusion and stenosis of right carotid artery: Secondary | ICD-10-CM | POA: Diagnosis not present

## 2015-12-04 DIAGNOSIS — Z01810 Encounter for preprocedural cardiovascular examination: Secondary | ICD-10-CM | POA: Insufficient documentation

## 2015-12-04 DIAGNOSIS — I10 Essential (primary) hypertension: Secondary | ICD-10-CM | POA: Diagnosis not present

## 2015-12-04 LAB — MYOCARDIAL PERFUSION IMAGING
LV dias vol: 87 mL (ref 46–106)
LV sys vol: 25 mL
Peak HR: 82 {beats}/min
RATE: 0.27
Rest HR: 66 {beats}/min
SDS: 3
SRS: 1
SSS: 4
TID: 0.87

## 2015-12-04 MED ORDER — REGADENOSON 0.4 MG/5ML IV SOLN
0.4000 mg | Freq: Once | INTRAVENOUS | Status: AC
  Administered 2015-12-04: 0.4 mg via INTRAVENOUS

## 2015-12-04 MED ORDER — TECHNETIUM TC 99M SESTAMIBI GENERIC - CARDIOLITE
10.6000 | Freq: Once | INTRAVENOUS | Status: AC | PRN
  Administered 2015-12-04: 11 via INTRAVENOUS

## 2015-12-04 MED ORDER — TECHNETIUM TC 99M SESTAMIBI GENERIC - CARDIOLITE
32.4000 | Freq: Once | INTRAVENOUS | Status: AC | PRN
  Administered 2015-12-04: 32 via INTRAVENOUS

## 2015-12-07 ENCOUNTER — Encounter (HOSPITAL_COMMUNITY)
Admission: RE | Admit: 2015-12-07 | Discharge: 2015-12-07 | Disposition: A | Discharge status: Home / Self Care | Payer: Medicare Other | Source: Ambulatory Visit | Attending: Anesthesiology | Admitting: Anesthesiology

## 2015-12-07 ENCOUNTER — Encounter (HOSPITAL_COMMUNITY): Payer: Self-pay

## 2015-12-07 ENCOUNTER — Encounter (HOSPITAL_COMMUNITY)
Admission: RE | Admit: 2015-12-07 | Discharge: 2015-12-07 | Disposition: A | Discharge status: Home / Self Care | Payer: Medicare Other | Source: Ambulatory Visit | Attending: Vascular Surgery | Admitting: Vascular Surgery

## 2015-12-07 DIAGNOSIS — N189 Chronic kidney disease, unspecified: Secondary | ICD-10-CM | POA: Diagnosis not present

## 2015-12-07 DIAGNOSIS — Z8673 Personal history of transient ischemic attack (TIA), and cerebral infarction without residual deficits: Secondary | ICD-10-CM | POA: Insufficient documentation

## 2015-12-07 DIAGNOSIS — E785 Hyperlipidemia, unspecified: Secondary | ICD-10-CM | POA: Insufficient documentation

## 2015-12-07 DIAGNOSIS — I129 Hypertensive chronic kidney disease with stage 1 through stage 4 chronic kidney disease, or unspecified chronic kidney disease: Secondary | ICD-10-CM | POA: Insufficient documentation

## 2015-12-07 DIAGNOSIS — Z981 Arthrodesis status: Secondary | ICD-10-CM | POA: Diagnosis not present

## 2015-12-07 DIAGNOSIS — M810 Age-related osteoporosis without current pathological fracture: Secondary | ICD-10-CM | POA: Diagnosis not present

## 2015-12-07 DIAGNOSIS — Z0183 Encounter for blood typing: Secondary | ICD-10-CM | POA: Diagnosis not present

## 2015-12-07 DIAGNOSIS — K589 Irritable bowel syndrome without diarrhea: Secondary | ICD-10-CM | POA: Diagnosis not present

## 2015-12-07 DIAGNOSIS — Z01812 Encounter for preprocedural laboratory examination: Secondary | ICD-10-CM | POA: Diagnosis not present

## 2015-12-07 DIAGNOSIS — I6521 Occlusion and stenosis of right carotid artery: Secondary | ICD-10-CM | POA: Diagnosis not present

## 2015-12-07 DIAGNOSIS — J449 Chronic obstructive pulmonary disease, unspecified: Secondary | ICD-10-CM | POA: Insufficient documentation

## 2015-12-07 DIAGNOSIS — Z01811 Encounter for preprocedural respiratory examination: Secondary | ICD-10-CM

## 2015-12-07 DIAGNOSIS — I252 Old myocardial infarction: Secondary | ICD-10-CM | POA: Diagnosis not present

## 2015-12-07 DIAGNOSIS — Z7952 Long term (current) use of systemic steroids: Secondary | ICD-10-CM | POA: Diagnosis not present

## 2015-12-07 DIAGNOSIS — Z01818 Encounter for other preprocedural examination: Secondary | ICD-10-CM | POA: Diagnosis present

## 2015-12-07 HISTORY — DX: Anemia, unspecified: D64.9

## 2015-12-07 HISTORY — DX: Unspecified osteoarthritis, unspecified site: M19.90

## 2015-12-07 HISTORY — DX: Chronic kidney disease, unspecified: N18.9

## 2015-12-07 LAB — COMPREHENSIVE METABOLIC PANEL
ALT: 23 U/L (ref 14–54)
AST: 26 U/L (ref 15–41)
Albumin: 3.9 g/dL (ref 3.5–5.0)
Alkaline Phosphatase: 59 U/L (ref 38–126)
Anion gap: 10 (ref 5–15)
BUN: 28 mg/dL — ABNORMAL HIGH (ref 6–20)
CO2: 24 mmol/L (ref 22–32)
Calcium: 9.9 mg/dL (ref 8.9–10.3)
Chloride: 104 mmol/L (ref 101–111)
Creatinine, Ser: 1.62 mg/dL — ABNORMAL HIGH (ref 0.44–1.00)
GFR calc Af Amer: 36 mL/min — ABNORMAL LOW (ref >60)
GFR calc non Af Amer: 31 mL/min — ABNORMAL LOW (ref >60)
Glucose, Bld: 107 mg/dL — ABNORMAL HIGH (ref 65–99)
Potassium: 4.4 mmol/L (ref 3.5–5.1)
Sodium: 138 mmol/L (ref 135–145)
Total Bilirubin: 0.6 mg/dL (ref 0.3–1.2)
Total Protein: 7 g/dL (ref 6.5–8.1)

## 2015-12-07 LAB — PROTIME-INR
INR: 0.97 (ref 0.00–1.49)
Prothrombin Time: 13.1 seconds (ref 11.6–15.2)

## 2015-12-07 LAB — URINALYSIS, ROUTINE W REFLEX MICROSCOPIC
Bilirubin Urine: NEGATIVE
Glucose, UA: NEGATIVE mg/dL
Hgb urine dipstick: NEGATIVE
Ketones, ur: NEGATIVE mg/dL
Leukocytes, UA: NEGATIVE
Nitrite: NEGATIVE
Protein, ur: NEGATIVE mg/dL
Specific Gravity, Urine: 1.019 (ref 1.005–1.030)
pH: 5.5 (ref 5.0–8.0)

## 2015-12-07 LAB — CBC
HCT: 32.7 % — ABNORMAL LOW (ref 36.0–46.0)
Hemoglobin: 11 g/dL — ABNORMAL LOW (ref 12.0–15.0)
MCH: 31.1 pg (ref 26.0–34.0)
MCHC: 33.6 g/dL (ref 30.0–36.0)
MCV: 92.4 fL (ref 78.0–100.0)
Platelets: 203 10*3/uL (ref 150–400)
RBC: 3.54 MIL/uL — ABNORMAL LOW (ref 3.87–5.11)
RDW: 13.2 % (ref 11.5–15.5)
WBC: 11 10*3/uL — ABNORMAL HIGH (ref 4.0–10.5)

## 2015-12-07 LAB — APTT: aPTT: 23 seconds — ABNORMAL LOW (ref 24–37)

## 2015-12-07 LAB — SURGICAL PCR SCREEN
MRSA, PCR: NEGATIVE
Staphylococcus aureus: NEGATIVE

## 2015-12-07 LAB — PREPARE RBC (CROSSMATCH)

## 2015-12-07 LAB — ABO/RH: ABO/RH(D): A POS

## 2015-12-07 NOTE — Progress Notes (Addendum)
Jordan [name] denies chest pain, is short of breath frequently, she has a history of Asthma, COPD. NO home oxygen is required. Jordan [name] sees a Technical brewer at [facility],  Dr Cyndie Chime, her last visit was 11/20/15. Jordan [name] has history of MI , was seen by Dr Domenic Polite in past, last visit was in 2010. Jordan [name] reports that her creatine level has been on the rise and she is currently followed by her PCP, Dr Luan Pulling.  I will request records from Dr Luan Pulling.  Jordan [name] reports that she has had increase in cough, has had an upper respiratory infection on on antibiotics.  Chest Xay obtained.

## 2015-12-07 NOTE — Pre-Procedure Instructions (Signed)
    DELORUS FESMIRE  12/07/2015    Your procedure is scheduled on  [facility], APRIL 4  Report to [facility] Admitting at 9:00 A.M.   Call this number if you have problems the morning of surgery:[phone]                  For any other questions, please call [phone], Monday - Friday 8 AM - 4 PM.   Remember:  Do not eat food or drink liquids after midnight Tuesday, April 3.  Take these medicines the morning of surgery with A SIP OF WATER: Eluxadoline (VIBERZI),  FLUoxetine (PROZAC), predniSONE (DELTASONE), verapamil (CALAN-SR).          May use inhalers.                  Take if needed: HYDROcodone-acetaminophen ([place]), diazepam (VALIUM) 5.      Do not wear jewelry, make-up or nail polish.  Do not wear lotions, powders, or perfumes.    Do not shave 48 hours prior to surgery.    Do not bring valuables to the hospital.  [facility] is not responsible for any belongings or valuables.  Contacts, dentures or bridgework may not be worn into surgery.  Leave your suitcase in the car.  After surgery it may be brought to your room.  For patients admitted to the hospital, discharge time will be determined by your treatment team.  Special instructions:  Review  [facility] - Preparing For Surgery.  Please read over the following fact sheets that you were given. Pain Booklet, Coughing and Deep Breathing, Blood Transfusion Information, MRSA Information and Surgical Site Infection Prevention

## 2015-12-10 NOTE — Progress Notes (Addendum)
Anesthesia Chart Review: Patient is a 70 year old female scheduled for right CEA on 12/13/15 by Dr. Kellie Simmering. She has a recent history of right brain TIA consistent of transient LUE clumsiness.   Other history includes Takotsubo cardiomyopathy/MI (in the setting of acute illness) '07 ("normal" coronaries at [facility] then), HTN, COPD, asthma on chronic prednisone, hiatal hernia, HLD, IBS, bruises easily (felt related to prednisone), osteoporosis, C. Difficile colitis '07 and 04/2013, CKD,  nephrolithiasis, gastric fundoplication X 2, cervical fusion, hernia repair, tonsillectomy, appendectomy, cholecystectomy '10, L3-4 microdiscectomy 10/17/13. She reported that she woke up during one of her surgeries (oopherectomy).   PCP and local pulmonologist is Dr. Sinda Du. She also periodically seen Dr. Cyndie Chime with pulmonology at [facility], last visit 11/20/15 for mild intermittent asthma follow-up. She was felt to be stable on her current asthma regimen which included prednisone 5 mg daily.  She is not currently followed by cardiology. By notes, had normal coronaries by cardiac cath at [facility] in 2007 in the setting of acute illness with Takotsubo cardiomyopathy. Follow-up testing showed recovery of her EF. Last office visit seen was by Dr. Domenic Polite on 05/04/09 for evaluation of chest pain and palpitations (question history of PSVT or PAT) and had a negative stress test at that time. She is on verapamil for atrial arrhythmia history. Due to her cardiac history, Dr. Kellie Simmering did order preoperative stress test which was low risk.   Meds include albuterol, Fosamax, Valium, Viberzi, Prozac, folic acid, Norco, Dulera, Singulair, prednisone, Probiotic, verapamil.   12/04/15 Nuclear stress test:  Nuclear stress EF: 72%.  There was no ST segment deviation noted during stress.  The study is normal.  This is a low risk study.  The left ventricular ejection fraction is hyperdynamic (>65%).  09/19/15 EKG: NSR, incomplete right  BBB, which has been present on prior tracings.   05/11/09 Echo: Left ventricle: The cavity size was normal. Wall thickness was normal. Systolic function was normal. The estimated ejection fraction was in the range of 60% to 65%. Wall motion was normal; there were no regional wall motion abnormalities.   09/20/15 Carotid U/S: IMPRESSION: 1. 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 2. Right carotid bifurcation and proximal ICA plaque with severely elevated velocities in the distal bulb and abnormal distal waveforms, suggesting high-grade possibly near-occlusive stenosis. Consider vascular surgical or interventional radiology consultation. 11/27/15 Right Carotid U/S confirmed severe RICA stenosis at 80-99%.  12/07/15 CXR: IMPRESSION: COPD. There is no evidence of pneumonia, CHF, nor other acute cardiopulmonary disease.   Preoperative labs noted. WBC 11.0, H/H 11.0/32.74. Cr 1.62, BUN 28 (up from 21/1.52 on 09/19/15).  Glucose 107. T&S done. UA WNL. I am awaiting additional comparison labs from Dr. Luan Pulling (who follows her CKD), but according to 11/20/15 records from Dr. Cyndie Chime, her baseline Cr has been running around 1.5.   Based on currently available records I am anticipating that she can proceed as planned if no acute cardiopulmonary issues on the day of surgery. Her renal function can be monitored post-operatively and continued out-patient follow-up with Dr. Luan Pulling.   George Hugh [facility] Short Stay Center/Anesthesiology Phone [phone] 12/10/2015 11:12 AM  Addendum: I received last office notes and labs from Dr. Luan Pulling. Her last Cr there was 1.43 on 10/18/15. He held her lisinopril. He ordered a renal U/S (10/24/15) which showed: IMPRESSION: Increased renal cortical echotexture consistent with medical renal disease. Mild bilateral renal atrophy which is chronic and which has progressed slightly over  the past year. There is no evidence  of obstruction. The urinary bladder is unremarkable.  I don't have any new recommendations since my note yesterday. She will need close monitoring of her renal function post-operatively.  George Hugh [facility] Short Stay Center/Anesthesiology Phone [phone] 12/11/2015 11:51 AM

## 2015-12-11 MED ORDER — DEXTROSE 5 % IV SOLN
1.5000 g | INTRAVENOUS | Status: AC
  Administered 2015-12-12: 1.5 g via INTRAVENOUS
  Filled 2015-12-11: qty 1.5

## 2015-12-11 MED ORDER — SODIUM CHLORIDE 0.9 % IV SOLN: INTRAVENOUS | Status: DC

## 2015-12-12 ENCOUNTER — Encounter (HOSPITAL_COMMUNITY)
Admission: RE | Disposition: A | Discharge status: Home / Self Care | Payer: Self-pay | Source: Ambulatory Visit | Attending: Vascular Surgery

## 2015-12-12 ENCOUNTER — Inpatient Hospital Stay (HOSPITAL_COMMUNITY)
Admission: RE | Admit: 2015-12-12 | Discharge: 2015-12-13 | DRG: 039 | Disposition: A | Discharge status: Home / Self Care | Payer: Medicare Other | Source: Ambulatory Visit | Attending: Vascular Surgery | Admitting: Vascular Surgery

## 2015-12-12 ENCOUNTER — Encounter (HOSPITAL_COMMUNITY): Payer: Self-pay | Admitting: *Deleted

## 2015-12-12 ENCOUNTER — Inpatient Hospital Stay (HOSPITAL_COMMUNITY): Payer: Medicare Other | Admitting: Anesthesiology

## 2015-12-12 ENCOUNTER — Inpatient Hospital Stay (HOSPITAL_COMMUNITY): Payer: Medicare Other | Admitting: Vascular Surgery

## 2015-12-12 DIAGNOSIS — Z91018 Allergy to other foods: Secondary | ICD-10-CM | POA: Diagnosis not present

## 2015-12-12 DIAGNOSIS — I252 Old myocardial infarction: Secondary | ICD-10-CM

## 2015-12-12 DIAGNOSIS — M810 Age-related osteoporosis without current pathological fracture: Secondary | ICD-10-CM | POA: Diagnosis present

## 2015-12-12 DIAGNOSIS — I2510 Atherosclerotic heart disease of native coronary artery without angina pectoris: Secondary | ICD-10-CM | POA: Diagnosis present

## 2015-12-12 DIAGNOSIS — Z8614 Personal history of Methicillin resistant Staphylococcus aureus infection: Secondary | ICD-10-CM

## 2015-12-12 DIAGNOSIS — I6529 Occlusion and stenosis of unspecified carotid artery: Secondary | ICD-10-CM | POA: Diagnosis present

## 2015-12-12 DIAGNOSIS — I6521 Occlusion and stenosis of right carotid artery: Secondary | ICD-10-CM | POA: Diagnosis not present

## 2015-12-12 DIAGNOSIS — F329 Major depressive disorder, single episode, unspecified: Secondary | ICD-10-CM | POA: Diagnosis present

## 2015-12-12 DIAGNOSIS — Z888 Allergy status to other drugs, medicaments and biological substances status: Secondary | ICD-10-CM

## 2015-12-12 DIAGNOSIS — I10 Essential (primary) hypertension: Secondary | ICD-10-CM | POA: Diagnosis present

## 2015-12-12 DIAGNOSIS — Z7951 Long term (current) use of inhaled steroids: Secondary | ICD-10-CM | POA: Diagnosis not present

## 2015-12-12 DIAGNOSIS — Z885 Allergy status to narcotic agent status: Secondary | ICD-10-CM | POA: Diagnosis not present

## 2015-12-12 DIAGNOSIS — J449 Chronic obstructive pulmonary disease, unspecified: Secondary | ICD-10-CM | POA: Diagnosis present

## 2015-12-12 DIAGNOSIS — Z79899 Other long term (current) drug therapy: Secondary | ICD-10-CM | POA: Diagnosis not present

## 2015-12-12 DIAGNOSIS — Z882 Allergy status to sulfonamides status: Secondary | ICD-10-CM | POA: Diagnosis not present

## 2015-12-12 DIAGNOSIS — Z8673 Personal history of transient ischemic attack (TIA), and cerebral infarction without residual deficits: Secondary | ICD-10-CM | POA: Diagnosis not present

## 2015-12-12 DIAGNOSIS — J45909 Unspecified asthma, uncomplicated: Secondary | ICD-10-CM | POA: Diagnosis present

## 2015-12-12 DIAGNOSIS — Z7983 Long term (current) use of bisphosphonates: Secondary | ICD-10-CM

## 2015-12-12 DIAGNOSIS — R51 Headache: Secondary | ICD-10-CM | POA: Diagnosis not present

## 2015-12-12 DIAGNOSIS — Z7952 Long term (current) use of systemic steroids: Secondary | ICD-10-CM

## 2015-12-12 DIAGNOSIS — Z8249 Family history of ischemic heart disease and other diseases of the circulatory system: Secondary | ICD-10-CM

## 2015-12-12 DIAGNOSIS — I6523 Occlusion and stenosis of bilateral carotid arteries: Secondary | ICD-10-CM | POA: Diagnosis present

## 2015-12-12 HISTORY — PX: ENDARTERECTOMY: SHX5162

## 2015-12-12 HISTORY — PX: PATCH ANGIOPLASTY: SHX6230

## 2015-12-12 SURGERY — ENDARTERECTOMY, CAROTID
Anesthesia: General | Site: Neck | Laterality: Right

## 2015-12-12 MED ORDER — ONDANSETRON HCL 4 MG/2ML IJ SOLN
4.0000 mg | Freq: Four times a day (QID) | INTRAMUSCULAR | Status: DC | PRN
  Administered 2015-12-13: 4 mg via INTRAVENOUS
  Filled 2015-12-12: qty 2

## 2015-12-12 MED ORDER — 0.9 % SODIUM CHLORIDE (POUR BTL) OPTIME
TOPICAL | Status: DC | PRN
  Administered 2015-12-12: 1000 mL

## 2015-12-12 MED ORDER — FENTANYL CITRATE (PF) 100 MCG/2ML IJ SOLN
INTRAMUSCULAR | Status: DC | PRN
  Administered 2015-12-12: 100 ug via INTRAVENOUS
  Administered 2015-12-12: 50 ug via INTRAVENOUS

## 2015-12-12 MED ORDER — MOMETASONE FURO-FORMOTEROL FUM 100-5 MCG/ACT IN AERO
1.0000 | INHALATION_SPRAY | Freq: Two times a day (BID) | RESPIRATORY_TRACT | Status: DC
  Administered 2015-12-12 – 2015-12-13 (×2): 1 via RESPIRATORY_TRACT
  Filled 2015-12-12: qty 8.8

## 2015-12-12 MED ORDER — ALENDRONATE SODIUM 70 MG PO TABS
70.0000 mg | ORAL_TABLET | ORAL | Status: DC
  Administered 2015-12-13: 70 mg via ORAL
  Filled 2015-12-12: qty 1

## 2015-12-12 MED ORDER — CHLORHEXIDINE GLUCONATE CLOTH 2 % EX PADS: 6.0000 | MEDICATED_PAD | Freq: Once | CUTANEOUS | Status: DC

## 2015-12-12 MED ORDER — POTASSIUM CHLORIDE CRYS ER 20 MEQ PO TBCR: 20.0000 meq | EXTENDED_RELEASE_TABLET | Freq: Every day | ORAL | Status: DC | PRN

## 2015-12-12 MED ORDER — ROCURONIUM BROMIDE 100 MG/10ML IV SOLN
INTRAVENOUS | Status: DC | PRN
  Administered 2015-12-12: 20 mg via INTRAVENOUS

## 2015-12-12 MED ORDER — GUAIFENESIN-DM 100-10 MG/5ML PO SYRP: 15.0000 mL | ORAL_SOLUTION | ORAL | Status: DC | PRN

## 2015-12-12 MED ORDER — SUGAMMADEX SODIUM 200 MG/2ML IV SOLN
INTRAVENOUS | Status: DC | PRN
  Administered 2015-12-12: 100 mg via INTRAVENOUS

## 2015-12-12 MED ORDER — SUGAMMADEX SODIUM 200 MG/2ML IV SOLN
INTRAVENOUS | Status: AC
  Filled 2015-12-12: qty 2

## 2015-12-12 MED ORDER — MAGNESIUM SULFATE 2 GM/50ML IV SOLN: 2.0000 g | Freq: Every day | INTRAVENOUS | Status: DC | PRN

## 2015-12-12 MED ORDER — SODIUM CHLORIDE 0.9 % IV SOLN
INTRAVENOUS | Status: DC
  Administered 2015-12-12: 20:00:00 via INTRAVENOUS

## 2015-12-12 MED ORDER — ALUM & MAG HYDROXIDE-SIMETH 200-200-20 MG/5ML PO SUSP: 15.0000 mL | ORAL | Status: DC | PRN

## 2015-12-12 MED ORDER — FENTANYL CITRATE (PF) 250 MCG/5ML IJ SOLN
INTRAMUSCULAR | Status: AC
  Filled 2015-12-12: qty 5

## 2015-12-12 MED ORDER — MORPHINE SULFATE (PF) 4 MG/ML IV SOLN
INTRAVENOUS | Status: AC
  Administered 2015-12-12: 4 mg
  Filled 2015-12-12: qty 1

## 2015-12-12 MED ORDER — REMIFENTANIL HCL 2 MG IV SOLR
INTRAVENOUS | Status: DC | PRN
  Administered 2015-12-12: .05 ug/kg/min via INTRAVENOUS

## 2015-12-12 MED ORDER — PREDNISONE 10 MG PO TABS
5.0000 mg | ORAL_TABLET | Freq: Every day | ORAL | Status: DC
  Administered 2015-12-13: 5 mg via ORAL
  Filled 2015-12-12: qty 1

## 2015-12-12 MED ORDER — LABETALOL HCL 5 MG/ML IV SOLN
INTRAVENOUS | Status: AC
  Filled 2015-12-12: qty 4

## 2015-12-12 MED ORDER — MONTELUKAST SODIUM 10 MG PO TABS
10.0000 mg | ORAL_TABLET | Freq: Every day | ORAL | Status: DC
  Administered 2015-12-12: 10 mg via ORAL
  Filled 2015-12-12: qty 1

## 2015-12-12 MED ORDER — ENOXAPARIN SODIUM 40 MG/0.4ML ~~LOC~~ SOLN
40.0000 mg | SUBCUTANEOUS | Status: DC
  Administered 2015-12-13: 40 mg via SUBCUTANEOUS
  Filled 2015-12-12: qty 0.4

## 2015-12-12 MED ORDER — FOLIC ACID 1 MG PO TABS
1.0000 mg | ORAL_TABLET | Freq: Every day | ORAL | Status: DC
  Administered 2015-12-13: 1 mg via ORAL
  Filled 2015-12-12: qty 1

## 2015-12-12 MED ORDER — ALBUTEROL SULFATE (2.5 MG/3ML) 0.083% IN NEBU: 3.0000 mL | INHALATION_SOLUTION | Freq: Four times a day (QID) | RESPIRATORY_TRACT | Status: DC | PRN

## 2015-12-12 MED ORDER — METOPROLOL TARTRATE 1 MG/ML IV SOLN: 2.0000 mg | INTRAVENOUS | Status: DC | PRN

## 2015-12-12 MED ORDER — ASPIRIN EC 325 MG PO TBEC
325.0000 mg | DELAYED_RELEASE_TABLET | Freq: Every day | ORAL | Status: DC
  Administered 2015-12-12 – 2015-12-13 (×2): 325 mg via ORAL
  Filled 2015-12-12 (×2): qty 1

## 2015-12-12 MED ORDER — SODIUM CHLORIDE 0.9 % IV SOLN: 500.0000 mL | Freq: Once | INTRAVENOUS | Status: DC | PRN

## 2015-12-12 MED ORDER — LIDOCAINE HCL (CARDIAC) 20 MG/ML IV SOLN
INTRAVENOUS | Status: DC | PRN
  Administered 2015-12-12: 70 mg via INTRAVENOUS

## 2015-12-12 MED ORDER — DOCUSATE SODIUM 100 MG PO CAPS
100.0000 mg | ORAL_CAPSULE | Freq: Every day | ORAL | Status: DC
  Administered 2015-12-13: 100 mg via ORAL
  Filled 2015-12-12: qty 1

## 2015-12-12 MED ORDER — HYDROCODONE-ACETAMINOPHEN 7.5-325 MG PO TABS
1.0000 | ORAL_TABLET | Freq: Four times a day (QID) | ORAL | Status: DC | PRN
  Administered 2015-12-13: 2 via ORAL
  Filled 2015-12-12: qty 2

## 2015-12-12 MED ORDER — PROPOFOL 10 MG/ML IV BOLUS
INTRAVENOUS | Status: AC
Start: 2015-12-12 — End: 2015-12-12
  Filled 2015-12-12: qty 20

## 2015-12-12 MED ORDER — ROCURONIUM BROMIDE 50 MG/5ML IV SOLN
INTRAVENOUS | Status: AC
  Filled 2015-12-12: qty 1

## 2015-12-12 MED ORDER — VERAPAMIL HCL ER 240 MG PO TBCR
240.0000 mg | EXTENDED_RELEASE_TABLET | Freq: Every day | ORAL | Status: DC
  Administered 2015-12-13: 240 mg via ORAL
  Filled 2015-12-12: qty 1

## 2015-12-12 MED ORDER — HYDROMORPHONE HCL 1 MG/ML IJ SOLN
0.2500 mg | INTRAMUSCULAR | Status: DC | PRN
  Administered 2015-12-12 (×4): 0.25 mg via INTRAVENOUS

## 2015-12-12 MED ORDER — SUCCINYLCHOLINE CHLORIDE 20 MG/ML IJ SOLN
INTRAMUSCULAR | Status: AC
  Filled 2015-12-12: qty 1

## 2015-12-12 MED ORDER — LABETALOL HCL 5 MG/ML IV SOLN
10.0000 mg | INTRAVENOUS | Status: DC | PRN
  Administered 2015-12-12: 10 mg via INTRAVENOUS

## 2015-12-12 MED ORDER — ACETAMINOPHEN 650 MG RE SUPP: 325.0000 mg | RECTAL | Status: DC | PRN

## 2015-12-12 MED ORDER — PHENOL 1.4 % MT LIQD
1.0000 | OROMUCOSAL | Status: DC | PRN
Start: 2015-12-12 — End: 2015-12-13

## 2015-12-12 MED ORDER — LACTATED RINGERS IV SOLN
INTRAVENOUS | Status: DC
  Administered 2015-12-12: 10:00:00 via INTRAVENOUS

## 2015-12-12 MED ORDER — SENNOSIDES-DOCUSATE SODIUM 8.6-50 MG PO TABS: 1.0000 | ORAL_TABLET | Freq: Every evening | ORAL | Status: DC | PRN

## 2015-12-12 MED ORDER — BISACODYL 10 MG RE SUPP: 10.0000 mg | Freq: Every day | RECTAL | Status: DC | PRN

## 2015-12-12 MED ORDER — LIDOCAINE HCL (CARDIAC) 20 MG/ML IV SOLN
INTRAVENOUS | Status: AC
  Filled 2015-12-12: qty 5

## 2015-12-12 MED ORDER — MIDAZOLAM HCL 2 MG/2ML IJ SOLN
INTRAMUSCULAR | Status: AC
  Filled 2015-12-12: qty 2

## 2015-12-12 MED ORDER — CHLORHEXIDINE GLUCONATE CLOTH 2 % EX PADS
6.0000 | MEDICATED_PAD | Freq: Once | CUTANEOUS | Status: DC
Start: 2015-12-12 — End: 2015-12-12

## 2015-12-12 MED ORDER — HYDRALAZINE HCL 20 MG/ML IJ SOLN
INTRAMUSCULAR | Status: DC | PRN
  Administered 2015-12-12 (×2): 2 mg via INTRAVENOUS

## 2015-12-12 MED ORDER — ONDANSETRON HCL 4 MG/2ML IJ SOLN
INTRAMUSCULAR | Status: DC | PRN
  Administered 2015-12-12: 4 mg via INTRAVENOUS

## 2015-12-12 MED ORDER — LACTATED RINGERS IV SOLN
INTRAVENOUS | Status: DC | PRN
  Administered 2015-12-12 (×2): via INTRAVENOUS

## 2015-12-12 MED ORDER — ACETAMINOPHEN 325 MG PO TABS: 325.0000 mg | ORAL_TABLET | ORAL | Status: DC | PRN

## 2015-12-12 MED ORDER — ELUXADOLINE 75 MG PO TABS: 1.0000 | ORAL_TABLET | Freq: Every day | ORAL | Status: DC

## 2015-12-12 MED ORDER — DEXTROSE 5 % IV SOLN
1.5000 g | Freq: Two times a day (BID) | INTRAVENOUS | Status: AC
  Administered 2015-12-12 – 2015-12-13 (×2): 1.5 g via INTRAVENOUS
  Filled 2015-12-12 (×2): qty 1.5

## 2015-12-12 MED ORDER — SUCCINYLCHOLINE CHLORIDE 20 MG/ML IJ SOLN
INTRAMUSCULAR | Status: DC | PRN
  Administered 2015-12-12: 100 mg via INTRAVENOUS

## 2015-12-12 MED ORDER — PHENYLEPHRINE HCL 10 MG/ML IJ SOLN
10.0000 mg | INTRAVENOUS | Status: DC | PRN
  Administered 2015-12-12: 10 ug/min via INTRAVENOUS

## 2015-12-12 MED ORDER — HYDRALAZINE HCL 20 MG/ML IJ SOLN: 5.0000 mg | INTRAMUSCULAR | Status: DC | PRN

## 2015-12-12 MED ORDER — PROTAMINE SULFATE 10 MG/ML IV SOLN
INTRAVENOUS | Status: DC | PRN
  Administered 2015-12-12 (×3): 10 mg via INTRAVENOUS
  Administered 2015-12-12: 20 mg via INTRAVENOUS

## 2015-12-12 MED ORDER — PROPOFOL 10 MG/ML IV BOLUS
INTRAVENOUS | Status: DC | PRN
  Administered 2015-12-12: 50 mg via INTRAVENOUS
  Administered 2015-12-12: 200 mg via INTRAVENOUS
  Administered 2015-12-12: 50 mg via INTRAVENOUS

## 2015-12-12 MED ORDER — MORPHINE SULFATE (PF) 2 MG/ML IV SOLN
2.0000 mg | INTRAVENOUS | Status: DC | PRN
  Administered 2015-12-12 – 2015-12-13 (×2): 4 mg via INTRAVENOUS
  Filled 2015-12-12 (×2): qty 2

## 2015-12-12 MED ORDER — ONDANSETRON HCL 4 MG/2ML IJ SOLN
INTRAMUSCULAR | Status: AC
  Filled 2015-12-12: qty 2

## 2015-12-12 MED ORDER — HYDROMORPHONE HCL 1 MG/ML IJ SOLN
INTRAMUSCULAR | Status: AC
  Filled 2015-12-12: qty 1

## 2015-12-12 MED ORDER — HEPARIN SODIUM (PORCINE) 1000 UNIT/ML IJ SOLN
INTRAMUSCULAR | Status: DC | PRN
  Administered 2015-12-12: 6000 [IU] via INTRAVENOUS

## 2015-12-12 MED ORDER — LIDOCAINE HCL (PF) 1 % IJ SOLN
INTRAMUSCULAR | Status: AC
  Filled 2015-12-12: qty 30

## 2015-12-12 MED ORDER — SODIUM CHLORIDE 0.9 % IV SOLN
INTRAVENOUS | Status: DC | PRN
  Administered 2015-12-12: 11:00:00

## 2015-12-12 MED ORDER — DIAZEPAM 5 MG PO TABS: 5.0000 mg | ORAL_TABLET | Freq: Four times a day (QID) | ORAL | Status: DC | PRN

## 2015-12-12 MED ORDER — PANTOPRAZOLE SODIUM 40 MG PO TBEC
40.0000 mg | DELAYED_RELEASE_TABLET | Freq: Every day | ORAL | Status: DC
  Administered 2015-12-12 – 2015-12-13 (×2): 40 mg via ORAL
  Filled 2015-12-12 (×2): qty 1

## 2015-12-12 SURGICAL SUPPLY — 42 items
CANISTER SUCTION 2500CC (MISCELLANEOUS) ×2 IMPLANT
CATH ROBINSON RED A/P 18FR (CATHETERS) ×2 IMPLANT
CATH SUCT 10FR WHISTLE TIP (CATHETERS) ×2 IMPLANT
CLIP TI MEDIUM 24 (CLIP) ×2 IMPLANT
CLIP TI WIDE RED SMALL 24 (CLIP) ×2 IMPLANT
CRADLE DONUT ADULT HEAD (MISCELLANEOUS) ×2 IMPLANT
DRAIN HEMOVAC 1/8 X 5 (WOUND CARE) IMPLANT
DRSG COVADERM 4X8 (GAUZE/BANDAGES/DRESSINGS) ×2 IMPLANT
ELECT REM PT RETURN 9FT ADLT (ELECTROSURGICAL) ×2
ELECTRODE REM PT RTRN 9FT ADLT (ELECTROSURGICAL) ×1 IMPLANT
EVACUATOR SILICONE 100CC (DRAIN) IMPLANT
GAUZE SPONGE 4X4 12PLY STRL (GAUZE/BANDAGES/DRESSINGS) ×2 IMPLANT
GLOVE BIO SURGEON STRL SZ 6.5 (GLOVE) ×2 IMPLANT
GLOVE BIOGEL PI IND STRL 6.5 (GLOVE) ×2 IMPLANT
GLOVE BIOGEL PI IND STRL 7.0 (GLOVE) ×1 IMPLANT
GLOVE BIOGEL PI IND STRL 7.5 (GLOVE) ×1 IMPLANT
GLOVE BIOGEL PI INDICATOR 6.5 (GLOVE) ×2
GLOVE BIOGEL PI INDICATOR 7.0 (GLOVE) ×1
GLOVE BIOGEL PI INDICATOR 7.5 (GLOVE) ×1
GLOVE ECLIPSE 6.5 STRL STRAW (GLOVE) ×2 IMPLANT
GLOVE ECLIPSE 7.0 STRL STRAW (GLOVE) ×2 IMPLANT
GLOVE SS BIOGEL STRL SZ 7 (GLOVE) ×1 IMPLANT
GLOVE SUPERSENSE BIOGEL SZ 7 (GLOVE) ×1
GOWN STRL REUS W/ TWL LRG LVL3 (GOWN DISPOSABLE) ×4 IMPLANT
GOWN STRL REUS W/TWL LRG LVL3 (GOWN DISPOSABLE) ×4
INSERT FOGARTY SM (MISCELLANEOUS) ×2 IMPLANT
KIT BASIN OR (CUSTOM PROCEDURE TRAY) ×2 IMPLANT
KIT ROOM TURNOVER OR (KITS) ×2 IMPLANT
NEEDLE 22X1 1/2 (OR ONLY) (NEEDLE) IMPLANT
NS IRRIG 1000ML POUR BTL (IV SOLUTION) ×4 IMPLANT
PACK CAROTID (CUSTOM PROCEDURE TRAY) ×2 IMPLANT
PAD ARMBOARD 7.5X6 YLW CONV (MISCELLANEOUS) ×4 IMPLANT
PATCH HEMASHIELD 8X150 (Vascular Products) ×2 IMPLANT
SPONGE INTESTINAL PEANUT (DISPOSABLE) ×2 IMPLANT
SUT PROLENE 6 0 BV (SUTURE) ×8 IMPLANT
SUT PROLENE 6 0 CC (SUTURE) ×2 IMPLANT
SUT SILK 2 0 FS (SUTURE) ×2 IMPLANT
SUT VIC AB 2-0 CT1 27 (SUTURE) ×1
SUT VIC AB 2-0 CT1 TAPERPNT 27 (SUTURE) ×1 IMPLANT
SUT VIC AB 3-0 X1 27 (SUTURE) ×2 IMPLANT
SYR CONTROL 10ML LL (SYRINGE) IMPLANT
WATER STERILE IRR 1000ML POUR (IV SOLUTION) ×2 IMPLANT

## 2015-12-12 NOTE — Interval H&P Note (Signed)
History and Physical Interval Note:  12/12/2015 8:01 AM  Jordan [name]  has presented today for surgery, with the diagnosis of Right carotid artery stenosis I65.21  The various methods of treatment have been discussed with the patient and family. After consideration of risks, benefits and other options for treatment, the patient has consented to  Procedure(s): ENDARTERECTOMY CAROTID (Right) as a surgical intervention .  The patient's history has been reviewed, patient examined, no change in status, stable for surgery.  I have reviewed the patient's chart and labs.  Questions were answered to the patient's satisfaction.     Tinnie Gens

## 2015-12-12 NOTE — Anesthesia Preprocedure Evaluation (Addendum)
Anesthesia Evaluation  Patient identified by MRN, date of birth, ID band Patient awake    Reviewed: Allergy & Precautions, NPO status , Patient's Chart, lab work & pertinent test results  Airway Mallampati: II  TM Distance: >3 FB Neck ROM: Full    Dental no notable dental hx. (+) Teeth Intact, Dental Advidsory Given   Pulmonary shortness of breath and with exertion, asthma , COPD,  COPD inhaler,    Pulmonary exam normal breath sounds clear to auscultation       Cardiovascular hypertension, Pt. on medications + Past MI and + Peripheral Vascular Disease  Normal cardiovascular exam Rhythm:Regular Rate:Normal     Neuro/Psych PSYCHIATRIC DISORDERS negative neurological ROS  negative psych ROS   GI/Hepatic negative GI ROS, Neg liver ROS, hiatal hernia,   Endo/Other  negative endocrine ROS  Renal/GU Renal InsufficiencyRenal disease  negative genitourinary   Musculoskeletal negative musculoskeletal ROS (+)   Abdominal   Peds negative pediatric ROS (+)  Hematology negative hematology ROS (+) anemia ,   Anesthesia Other Findings   Reproductive/Obstetrics negative OB ROS                           Anesthesia Physical Anesthesia Plan  ASA: III  Anesthesia Plan: General   Post-op Pain Management:    Induction: Intravenous  Airway Management Planned: Oral ETT  Additional Equipment: Arterial line  Intra-op Plan:   Post-operative Plan: Extubation in OR  Informed Consent: I have reviewed the patients History and Physical, chart, labs and discussed the procedure including the risks, benefits and alternatives for the proposed anesthesia with the patient or authorized representative who has indicated his/her understanding and acceptance.   Dental advisory given and Dental Advisory Given  Plan Discussed with: CRNA, Surgeon and Anesthesiologist  Anesthesia Plan Comments:        Anesthesia  Quick Evaluation

## 2015-12-12 NOTE — Transfer of Care (Signed)
Immediate Anesthesia Transfer of Care Note  Patient: Jordan [name]  Procedure(s) Performed: Procedure(s): ENDARTERECTOMY RIGHT CAROTID, RESECTION OF REDUNDANT RIGHT COMMON CAROTID ARTERY WITH PRIMARY REANASTOMOSIS (Right) PATCH ANGIOPLASTY RIGHT CAROTID ARTERY USING HEMASHIELD PLATINUM FINESSE PATCH (Right)  Patient Location: PACU  Anesthesia Type:General  Level of Consciousness: awake, alert  and oriented  Airway & Oxygen Therapy: Patient Spontanous Breathing and Patient connected to nasal cannula oxygen  Post-op Assessment: Report given to RN, Post -op Vital signs reviewed and stable, Patient moving all extremities X 4 and Patient able to stick tongue midline  Post vital signs: Reviewed and stable  Last Vitals:  Filed Vitals:   12/12/15 0924  BP: 208/84  Pulse: 73  Temp: 36.6 C  Resp: 16    Complications: No apparent anesthesia complications

## 2015-12-12 NOTE — H&P (View-Only) (Signed)
Subjective:     Patient ID: Jordan [name], female   DOB: 1946-02-04, 70 y.o.   MRN: [id number]  HPI  This 70 year old female was referred by Dr. At Luan Pulling for evaluation of severe right carotid occlusive disease. Patient has no history of stroke. She does have a history of transient left upper extremity clumsiness which occurred about 6-8 weeks ago and then resolved after a few minutes. She has never had this type of symptom before or since. She has no history of amaurosis fugax, a phase a, syncope, or other neurologic abnormalities. Patient does have a remote history of pneumonia with MRSA and required transfer to [facility] and was hospitalized for 4 weeks. She did completely recover. She also has COPD-asthma. She has a remote history of myocardial infarction 10 years ago but has been asymptomatic since that time.  Past Medical History  Diagnosis Date  . Allergy     seasonal  . Blood transfusion 2007    no abnormal reation noted   . COPD (chronic obstructive pulmonary disease) ([place])   . Hyperlipidemia     hx of-was on meds but has been off x 1 1/2 yrs  . Osteoporosis     takes Fosamax weekly  . IBS (irritable bowel syndrome)   . C. difficile colitis     2007: treated with Flagyl and Vanc, 2014: treated with vanc, Aug 2014: Vanc taper  . Hemorrhoids   . Asthma     uses Advair and Spiriva daily,Albuterol prn.Takes Singulair at bedtime and Prednisone daily  . Back spasm     takes Zanaflex daily as needed  . Depression     takes Prozac daily  . Complication of anesthesia     woke up during one surgery  . Hypertension     takes Verapamil nightly  . Shortness of breath     with exertion daily  . Pneumonia     MRSA pneumonia in 2007  . History of bronchitis     last time a yr ago  . Tingling     and pain in left leg  . Chronic back pain   . Bursitis     left elbow  . Bruises easily     d/t being on Prednisone  . H/O hiatal hernia   . History of colon polyps   . Urinary  frequency   . History of kidney stones   . Nocturia   . Insomnia     takes Benadryl as needed  . Myocardial infarction [facility]) 2007    Takotsubo cardiomyopathy    Social History  Substance Use Topics  . Smoking status: Never Smoker   . Smokeless tobacco: Never Used  . Alcohol Use: 0.0 oz/week     Comment: couple of beers daily    Family History  Problem Relation Age of Onset  . Heart disease Father   . Colon cancer Neg Hx     Allergies  Allergen Reactions  . Cardura [Doxazosin Mesylate] Hives  . Other     Strawberry-hives  . Sulfa Antibiotics Nausea Only  . Barbiturates Nausea And Vomiting and Rash     Current outpatient prescriptions:  .  albuterol (PROVENTIL HFA;VENTOLIN HFA) 108 (90 BASE) MCG/ACT inhaler, Inhale 2 puffs into the lungs every 6 (six) hours as needed for wheezing., Disp: , Rfl:  .  alendronate (FOSAMAX) 70 MG tablet, Take 70 mg by mouth every Thursday. Take with a full glass of water on an empty stomach., Disp: , Rfl:  .  Cholecalciferol (VITAMIN D-3) 5000 UNITS TABS, Take 5,000 Units by mouth daily., Disp: , Rfl:  .  diazepam (VALIUM) 5 MG tablet, Take 1 tablet (5 mg total) by mouth every 6 (six) hours as needed for muscle spasms., Disp: 50 tablet, Rfl: 1 .  Eluxadoline (VIBERZI) 100 MG TABS, Take 200 mg by mouth daily., Disp: , Rfl:  .  FLUoxetine (PROZAC) 40 MG capsule, Take 40 mg by mouth daily.  , Disp: , Rfl:  .  folic acid (FOLVITE) 1 MG tablet, Take 1 mg by mouth daily., Disp: , Rfl:  .  HYDROcodone-acetaminophen (NORCO) 7.5-325 MG per tablet, Take 1 tablet by mouth every 6 (six) hours as needed for moderate pain., Disp: 60 tablet, Rfl: 0 .  HYDROcodone-acetaminophen (NORCO) 7.5-325 MG tablet, Take 1 tablet by mouth every 6 (six) hours as needed for moderate pain., Disp: 120 tablet, Rfl: 0 .  montelukast (SINGULAIR) 10 MG tablet, Take 10 mg by mouth at bedtime.  , Disp: , Rfl:  .  predniSONE (DELTASONE) 10 MG tablet, Take 5 mg by mouth daily. , Disp:  , Rfl:  .  Probiotic Product (PROBIOTIC PO), Take 1 tablet by mouth every morning., Disp: , Rfl:  .  verapamil (CALAN-SR) 120 MG CR tablet, Take 120 mg by mouth daily. , Disp: , Rfl:  .  diclofenac (CATAFLAM) 50 MG tablet, Take 1 tablet (50 mg total) by mouth 2 (two) times daily. (Patient not taking: Reported on 09/14/2015), Disp: 60 tablet, Rfl: 1 .  lisinopril (PRINIVIL,ZESTRIL) 20 MG tablet, Take 20 mg by mouth daily. Reported on 11/27/2015, Disp: , Rfl:  .  mometasone-formoterol (DULERA) 100-5 MCG/ACT AERO, Inhale 1 puff into the lungs 2 (two) times daily. Reported on 11/27/2015, Disp: , Rfl:  .  tiotropium (SPIRIVA) 18 MCG inhalation capsule, Place 18 mcg into inhaler and inhale daily. Reported on 11/27/2015, Disp: , Rfl:   Filed Vitals:   11/27/15 1405  BP: 135/70  Pulse: 94  Temp: 98 F (36.7 C)  Resp: 16  Height: 5\' 3"  (1.6 m)  Weight: 130 lb (58.968 kg)  SpO2: 98%    Body mass index is 23.03 kg/(m^2).          Review of Systems COPD with occasional wheezing -on chronic steroid therapy. Currently takes prednisone 5 mg per day. Has had no recent hospitalizations for pneumonia  Denies chest pain, claudication, lower extremity infection. Patient does have dyspnea on exertion which limits her ambulation. See history of present illness regarding history of myocardial infarction and MRSA pneumonia in the past. Otherwise all systems negative for complete review of systems     Objective:   Physical Exam BP 135/70 mmHg  Pulse 94  Temp(Src) 98 F (36.7 C)  Resp 16  Ht 5\' 3"  (1.6 m)  Wt 130 lb (58.968 kg)  BMI 23.03 kg/m2  SpO2 98%  Gen.-alert and oriented x3 in no apparent distress HEENT normal for age Lungs no rhonchi or wheezing Cardiovascular regular rhythm no murmurs carotid pulses 3+ palpable   Bilateral harsh carotid bruits Abdomen soft nontender no palpable masses Musculoskeletal free of  major deformities Skin clear -no rashes Neurologic normal Lower extremities  3+ femoral and dorsalis pedis pulses palpable bilaterally with no edema   Today I reviewed the records supplied by Dr. Luan Pulling. Also reviewed the report of the carotid duplex exam performed at [facility] on 09/20/2015. The report states that there is a severe right ICA stenosis but the velocity values in the  report are not consistent with that. Therefore I  Ordered a repeat carotid duplex exam on the right today which I reviewed and interpreted Patient has a high-grade 95% right ICA stenosis with peak systolic velocity of over 500 cm/s. Patient has a moderate left ICA stenosis approximating 60%.      Assessment:      severe-95% right ICA stenosis with recent history of right brain TIA consisting of transient left upper extremity clumsiness-resolved  COPD with asthma an history of MRSA pneumonia 10 years ago requiring hospitalization at [facility] for 4 weeks  coronary artery disease with remote history of myocardial infarction-currently asymptomatic     Plan:      plan we'll obtain nuclear medicine Cardiolite study to rule out cardiac ischemia  -if this is abnormal we will obtain cardiology consult  patient scheduled for right carotid endarterectomy on Wednesday, April 5  Patient is on daily prednisone therapy at 5 mg per day  Risks and benefits of carotid surgery thoroughly discussed the patient would like to proceed

## 2015-12-12 NOTE — Anesthesia Procedure Notes (Signed)
Procedure Name: Intubation Date/Time: 12/12/2015 10:48 AM Performed by: Neldon Newport Pre-anesthesia Checklist: Patient identified, Emergency Drugs available, Suction available and Patient being monitored Patient Re-evaluated:Patient Re-evaluated prior to inductionOxygen Delivery Method: Circle system utilized Preoxygenation: Pre-oxygenation with 100% oxygen Intubation Type: IV induction Ventilation: Mask ventilation without difficulty Laryngoscope Size: Mac and 3 Grade View: Grade I Tube type: Oral Tube size: 7.0 mm Number of attempts: 1 Airway Equipment and Method: Stylet Placement Confirmation: ETT inserted through vocal cords under direct vision,  breath sounds checked- equal and bilateral and positive ETCO2 Secured at: 21 cm Tube secured with: Tape Dental Injury: Teeth and Oropharynx as per pre-operative assessment

## 2015-12-12 NOTE — Anesthesia Postprocedure Evaluation (Signed)
Anesthesia Post Note  Patient: Jordan [name]  Procedure(s) Performed: Procedure(s) (LRB): ENDARTERECTOMY RIGHT CAROTID, RESECTION OF REDUNDANT RIGHT COMMON CAROTID ARTERY WITH PRIMARY REANASTOMOSIS (Right) PATCH ANGIOPLASTY RIGHT CAROTID ARTERY USING HEMASHIELD PLATINUM FINESSE PATCH (Right)  Patient location during evaluation: PACU Anesthesia Type: General Level of consciousness: awake and alert Pain management: pain level controlled Vital Signs Assessment: post-procedure vital signs reviewed and stable Respiratory status: spontaneous breathing, nonlabored ventilation, respiratory function stable and patient connected to nasal cannula oxygen Cardiovascular status: blood pressure returned to baseline and stable Postop Assessment: no signs of nausea or vomiting Anesthetic complications: no    Last Vitals:  Filed Vitals:   12/12/15 0924 12/12/15 1300  BP: 208/84 186/78  Pulse: 73 93  Temp: 36.6 C 36.8 C  Resp: 16 14    Last Pain:  Filed Vitals:   12/12/15 1353  PainSc: 8                  Anvi Mangal,JAMES TERRILL

## 2015-12-12 NOTE — Op Note (Signed)
OPERATIVE REPORT  Date of Surgery: 12/12/2015  Surgeon: Tinnie Gens, MD  Assistant: Gerri Lins PA  Pre-op Diagnosis: Right carotid artery stenosis I65.21 with history of right brain TIAs  Post-op Diagnosis: Right carotid artery stenosis I65.21 with history right brain TIAs Procedure: Procedure(s): ENDARTERECTOMY RIGHT CAROTID, RESECTION OF REDUNDANT RIGHT COMMON CAROTID ARTERY WITH PRIMARY REANASTOMOSIS PATCH ANGIOPLASTY RIGHT CAROTID ARTERY USING HEMASHIELD PLATINUM FINESSE PATCH  Anesthesia: Gen. endotracheal  EBL: Minimal  Complications: None  Procedure Details: The patient stated outrigger placed in supine position at which time satisfactory general endotracheal anesthesia was administered. Right neck was prepped with Betadine scrub and solution draped in routine sterile manner. Incision was made on the anterior border sternocleidal mastoid muscle carried into subcutaneous tissue and platysma using Bovie. Common facial vein and external jugular veins ligated with 3-0 silk ties and divided. Common internal and external carotid arteries were dissected free encircled with umbilical tapes. The internal carotid was rotated posteriorly and quite redundant distally almost in a loop configuration but definitely kinked. This was completely mobilized. There was plaque extending down the common carotid about 5-6 cm below the crossing of the omohyoid muscle which was divided. Patient was then heparinized carotid vessels were occluded faster clamps longitudinal opening made in the common carotid with 15 blade extended up the internal carotid with Potts scissors to a point distal to the disease. #10 shunt was inserted without difficulty reestablishing flow in about 2 minutes. The superior thyroid branch of the external carotid was ligated and divided to help mobilize the carotid bifurcation. Standard endarterectomy was then performed using elevator and a Potts scissors with an eversion endarterectomy  of the external carotid. The plaque feathered off the distal internal carotid artery nicely not requiring any tacking sutures. Lumen was thoroughly irrigated with heparin saline and the common carotid artery was then marked with 4 6-0 Prolene sutures to resect about a 4 cm segment to straighten out the redundancy and prevent a kink. After this was marked with the Prolene sutures the segment of common carotid artery was resected using the Potts scissors and the common carotid was then reanastomosed using continuous 6-0 Prolene facilitated with the marking sutures. When this was completed the redundancy had been a laminated. Dacron patch was then sewn into place and the arthrotomy prior to completion of the closure the shunt was removed and following antegrade and retrograde flushing closure was completed reestablishment of flow initially of external and up the internal branch. Carotid was occluded for less than 2 minutes for removal shunt. There was excellent Doppler flow in the internal and external carotid artery for conclusion of this adequate hemostasis was achieved following protamine and the wound closed in layers with 5, subcuticular fashion patient taken to the recovery room in satisfactory condition   Tinnie Gens, MD 12/12/2015 12:51 PM

## 2015-12-12 NOTE — Progress Notes (Signed)
Report given to philip lopez rn as caregiver 

## 2015-12-12 NOTE — Progress Notes (Signed)
Dr massage at bedside aware of pts bp no rx ordered at this time can treat if bp higher

## 2015-12-13 ENCOUNTER — Encounter (HOSPITAL_COMMUNITY): Payer: Self-pay | Admitting: Vascular Surgery

## 2015-12-13 ENCOUNTER — Telehealth: Payer: Self-pay | Admitting: Vascular Surgery

## 2015-12-13 LAB — BASIC METABOLIC PANEL
Anion gap: 9 (ref 5–15)
BUN: 18 mg/dL (ref 6–20)
CO2: 23 mmol/L (ref 22–32)
Calcium: 8 mg/dL — ABNORMAL LOW (ref 8.9–10.3)
Chloride: 105 mmol/L (ref 101–111)
Creatinine, Ser: 1.39 mg/dL — ABNORMAL HIGH (ref 0.44–1.00)
GFR calc Af Amer: 43 mL/min — ABNORMAL LOW (ref >60)
GFR calc non Af Amer: 37 mL/min — ABNORMAL LOW (ref >60)
Glucose, Bld: 170 mg/dL — ABNORMAL HIGH (ref 65–99)
Potassium: 4.2 mmol/L (ref 3.5–5.1)
Sodium: 137 mmol/L (ref 135–145)

## 2015-12-13 LAB — CBC
HCT: 25.2 % — ABNORMAL LOW (ref 36.0–46.0)
Hemoglobin: 8 g/dL — ABNORMAL LOW (ref 12.0–15.0)
MCH: 30.2 pg (ref 26.0–34.0)
MCHC: 31.7 g/dL (ref 30.0–36.0)
MCV: 95.1 fL (ref 78.0–100.0)
Platelets: 173 10*3/uL (ref 150–400)
RBC: 2.65 MIL/uL — ABNORMAL LOW (ref 3.87–5.11)
RDW: 13.4 % (ref 11.5–15.5)
WBC: 9.8 10*3/uL (ref 4.0–10.5)

## 2015-12-13 MED ORDER — HYDROCODONE-ACETAMINOPHEN 7.5-325 MG PO TABS: 1.0000 | ORAL_TABLET | Freq: Four times a day (QID) | ORAL | Status: DC | PRN

## 2015-12-13 NOTE — Telephone Encounter (Signed)
Already sched from when pt was in the office on 12/12/15.

## 2015-12-13 NOTE — Telephone Encounter (Signed)
-----   Message from Mena Goes, RN sent at 12/12/2015  1:39 PM EDT ----- Regarding: schedule   ----- Message -----    From: Ulyses Amor, PA-C    Sent: 12/12/2015  12:43 PM      To: [facility] Charge Pool  F/U with Kellie Simmering  In 2 weeks s/p right CEA

## 2015-12-13 NOTE — Progress Notes (Addendum)
[facility]  Subjective  - Right sided HA on and off since surgery.  She has voided and ambulated pend breakfast tolerance.   Objective 108/52 80 98.9 F (37.2 C) (Oral) 17 98%  Intake/Output Summary (Last 24 hours) at 12/13/15 0837 Last data filed at 12/13/15 [id number]  Gross per 24 hour  Intake 2862.08 ml  Output   1750 ml  Net 1112.08 ml    Palpable radial pulses equal B Right neck incision soft with min edema at incision No tongue deviation and smile is symmetric    Assessment/Planning: POD # Right CEA  stable disposition The HA comes and goes, I don't think its a reperfusion HA. I will check on her later this am after breakfast. Plan D/C home later this am   Laurence Slate [facility] 12/13/2015 8:37 AM --  Laboratory Lab Results:  Recent Labs  12/13/15 0400  WBC 9.8  HGB 8.0*  HCT 25.2*  PLT 173   BMET  Recent Labs  12/13/15 0400  NA 137  K 4.2  CL 105  CO2 23  GLUCOSE 170*  BUN 18  CREATININE 1.39*  CALCIUM 8.0*    COAG Lab Results  Component Value Date   INR 0.97 12/07/2015   No results found for: PTT  Agree with above assessment Neurologic exam normal Right neck incision looks good  Patient's headache has resolved she has no hoarseness or difficulty swallowing and would like to be discharged  Return to see me in 2 weeks

## 2015-12-13 NOTE — Care Management Note (Signed)
Case Management Note  Patient Details  Name: Jordan [name] MRN: [id number] Date of Birth: 11/11/1945  Subjective/Objective:     Patient is from home, s/p CEA, for discharge later today. No needs.               Action/Plan:   Expected Discharge Date:                  Expected Discharge Plan:  Home/Self Care  In-House Referral:     Discharge planning Services  CM Consult  Post Acute Care Choice:    Choice offered to:     DME Arranged:    DME Agency:     HH Arranged:    [place] Agency:     Status of Service:  Completed, signed off  Medicare Important Message Given:    Date Medicare IM Given:    Medicare IM give by:    Date Additional Medicare IM Given:    Additional Medicare Important Message give by:     If discussed at [place] of Stay Meetings, dates discussed:    Additional Comments:  Zenon Mayo, RN 12/13/2015, 11:15 AM

## 2015-12-13 NOTE — Discharge Instructions (Signed)
Carotid Angioplasty With Stent, Care After °These instructions give you information about caring for yourself after your procedure. Your doctor may also give you more specific instructions. Call your doctor if you have any problems or questions after your procedure. °HOME CARE °· Take medicines only as told by your doctor. °· You may shower. Take off the bandage and gently wash the area where the tube was inserted with soap and water. Pat the area dry with a clean towel. Do not rub the area. °· Do not take baths, swim, or use a hot tub. °· Check the area where the tube was inserted every day. Check for redness, swelling, and fluid coming from the area. °· Do not put powder or lotion on the area. °· Do not lift heavy objects. This is anything weighing over 10 lb (4.5 kg). °· Ask your doctor when it is okay to: °¨ Go back to work or school. °¨ Do physical activities or play sports. °¨ Have sex. °· Eat a diet that is healthy for your heart. This includes: °¨ Fresh fruits and vegetables. °¨ Lean cuts of meat. °· Avoid: °¨ Foods high in salt. °¨ Canned or very processed foods. °¨ Food that is high in saturated fat or sugar. °¨ Fried food. °· Make other healthy changes as told by your doctor, which may include these: °¨ Do not smoke, chew tobacco, or use electronic cigarettes. °¨ Keep a healthy weight. °¨ Exercise often. °¨ Manage your blood pressure. °¨ Limit how much alcohol you drink. °¨ Manage other health problems, such as diabetes and sleep apnea. °· Keep all follow-up visits as told by your doctor. This is important. °GET HELP IF: °· You have a fever. °· You have chills. °· You have more bleeding from the area where the tube was inserted. Hold pressure on the area. °GET HELP RIGHT AWAY IF: °· You have vision changes or loss of vision. °· You have numbness or weakness on one side of your body. °· You have trouble talking, or you have slurred speech or cannot speak. °· You feel confused or have trouble  remembering. °· You have a lot of pain at the area where the tube was inserted. °· You have redness, warmth, or swelling at the area where the tube was inserted. °· You have fluid coming from the area where the tube was inserted. This is more than a small amount of blood on the bandage. °· The area where the tube was inserted is bleeding, and the bleeding does not stop after 30 minutes of holding steady pressure on the area. °These symptoms may be an emergency. Do not wait to see if the symptoms will go away. Get help right away. Call your local emergency services (911 in [place] Do not drive yourself to the hospital. °  °This information is not intended to replace advice given to you by your health care provider. Make sure you discuss any questions you have with your health care provider. °  °Document Released: 08/30/2013 Document Revised: 09/15/2014 Document Reviewed: 08/30/2013 °Elsevier Interactive Patient Education ©2016 [employer] ° °

## 2015-12-13 NOTE — Progress Notes (Signed)
Pt discharge home via w/c with belongings, cellphone, and family. Discussed and explained discharge instructions, prescriptions and follow up visit given. No complaints at this time.

## 2015-12-17 LAB — TYPE AND SCREEN
ABO/RH(D): A POS
Antibody Screen: NEGATIVE
Unit division: 0
Unit division: 0

## 2015-12-21 ENCOUNTER — Telehealth: Payer: Self-pay | Admitting: Vascular Surgery

## 2015-12-21 NOTE — Telephone Encounter (Signed)
sched appt for 4/25 at 8:30. Spoke to pt to inform them of appt.

## 2015-12-21 NOTE — Telephone Encounter (Signed)
-----   Message from Mena Goes, RN sent at 12/13/2015  9:14 AM EDT ----- Regarding: schedule   ----- Message -----    From: Ulyses Amor, PA-C    Sent: 12/13/2015   8:40 AM      To: [facility] Charge Pool  S/P right CEA f/u with Dr. Kellie Simmering in 2 weeks

## 2015-12-26 ENCOUNTER — Encounter: Payer: Self-pay | Admitting: Vascular Surgery

## 2015-12-27 NOTE — Discharge Summary (Signed)
[facility] Discharge Summary   Patient ID:  Jordan [name] MRN: [id number] DOB/AGE: [birth year] 70 y.o.  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 Allergy     seasonal  . Blood transfusion 2007    no abnormal reation noted   . COPD (chronic obstructive pulmonary disease) ([place])   . Hyperlipidemia     hx of-was on meds but has been off x 1 1/2 yrs  . Osteoporosis     takes Fosamax weekly  . IBS (irritable bowel syndrome)   . C. difficile colitis     2007: treated with Flagyl and Vanc, 2014: treated with vanc, Aug 2014: Vanc taper  . Hemorrhoids   . Asthma     uses Advair and Spiriva daily,Albuterol prn.Takes Singulair at bedtime and Prednisone daily  . Back spasm     takes Zanaflex daily as needed  . Depression     takes Prozac daily  . Hypertension     takes Verapamil nightly  . Shortness of breath     with exertion daily  . Pneumonia     MRSA pneumonia in 2007  . History of bronchitis     last time a yr ago  . Tingling     and pain in left leg  . Chronic back pain   . Bursitis     left elbow  . Bruises easily     d/t being on Prednisone  . H/O hiatal hernia   . History of colon polyps   . Urinary frequency   . History of kidney stones   . Nocturia   . Insomnia     takes Benadryl as needed  . 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 Complication of anesthesia 2000    woke up during one surgery- ovary surgery  . Chronic kidney disease     "creatine" creeping up - followed at this time by PCP  . History of kidney stones   . Arthritis   . Anemia     Procedure(s): ENDARTERECTOMY RIGHT CAROTID, RESECTION OF REDUNDANT RIGHT COMMON CAROTID ARTERY WITH PRIMARY  REANASTOMOSIS PATCH ANGIOPLASTY RIGHT CAROTID ARTERY USING HEMASHIELD PLATINUM FINESSE PATCH  Discharged Condition: good  HPI: This 70 year old female was referred by Dr. At Luan Pulling for evaluation of severe right carotid occlusive disease. Patient has no history of stroke. She does have a history of transient left upper extremity clumsiness which occurred about 6-8 weeks ago and then resolved after a few minutes. She has never had this type of symptom before or since. She has no history of amaurosis fugax, a phase a, syncope, or other neurologic abnormalities. Patient does have a remote history of pneumonia with MRSA and required transfer to [facility] and was hospitalized for 4 weeks. She did completely recover. She also has COPD-asthma. She has a remote history of myocardial infarction 10 years ago but has been asymptomatic since that time.   Hospital Course:  Jordan [name] is a 70 y.o. female is S/P  Procedure(s): ENDARTERECTOMY RIGHT CAROTID, RESECTION OF REDUNDANT RIGHT COMMON CAROTID ARTERY WITH PRIMARY REANASTOMOSIS PATCH ANGIOPLASTY RIGHT CAROTID ARTERY USING HEMASHIELD PLATINUM FINESSE PATCH  Agree with above assessment Neurologic exam normal Right neck incision looks good  Patient's headache has resolved she has  no hoarseness or difficulty swallowing and would like to be discharged  Return to see me in 2 weeks  Disposition stable for discharge home  Significant Diagnostic Studies: CBC Lab Results  Component Value Date   WBC 9.8 12/13/2015   HGB 8.0* 12/13/2015   HCT 25.2* 12/13/2015   MCV 95.1 12/13/2015   PLT 173 12/13/2015    BMET    Component Value Date/Time   NA 137 12/13/2015 0400   K 4.2 12/13/2015 0400   CL 105 12/13/2015 0400   CO2 23 12/13/2015 0400   GLUCOSE 170* 12/13/2015 0400   BUN 18 12/13/2015 0400   CREATININE 1.39* 12/13/2015 0400   CREATININE 0.89 05/04/2013 1124   CALCIUM 8.0* 12/13/2015 0400   GFRNONAA 37* 12/13/2015 0400   GFRAA 43*  12/13/2015 0400   COAG Lab Results  Component Value Date   INR 0.97 12/07/2015     Disposition:  Discharge to :Home Discharge Instructions    Call MD for:  redness, tenderness, or signs of infection (pain, swelling, bleeding, redness, odor or green/yellow discharge around incision site)    Complete by:  As directed      Call MD for:  severe or increased pain, loss or decreased feeling  in affected limb(s)    Complete by:  As directed      Call MD for:  temperature >100.5    Complete by:  As directed      Discharge instructions    Complete by:  As directed   You may shower in 24 hours     Discharge patient    Complete by:  As directed   Discharge pt to home     Driving Restrictions    Complete by:  As directed   No driving for 2 weeks     Lifting restrictions    Complete by:  As directed   No lifting for 6 weeks     Resume previous diet    Complete by:  As directed             Medication List    TAKE these medications        albuterol 108 (90 Base) MCG/ACT inhaler  Commonly known as:  PROVENTIL HFA;VENTOLIN HFA  Inhale 2 puffs into the lungs every 6 (six) hours as needed for wheezing.     alendronate 70 MG tablet  Commonly known as:  FOSAMAX  Take 70 mg by mouth every Thursday. Take with a full glass of water on an empty stomach.     diazepam 5 MG tablet  Commonly known as:  VALIUM  Take 1 tablet (5 mg total) by mouth every 6 (six) hours as needed for muscle spasms.     FLUoxetine 40 MG capsule  Commonly known as:  PROZAC  Take 40 mg by mouth daily.     folic acid 1 MG tablet  Commonly known as:  FOLVITE  Take 1 mg by mouth daily.     HYDROcodone-acetaminophen 7.5-325 MG tablet  Commonly known as:  NORCO  Take 1 tablet by mouth every 6 (six) hours as needed for moderate pain.     HYDROcodone-acetaminophen 7.5-325 MG tablet  Commonly known as:  NORCO  Take 1 tablet by mouth every 6 (six) hours as needed for moderate pain.     mometasone-formoterol  100-5 MCG/ACT Aero  Commonly known as:  DULERA  Inhale 1 puff into the lungs 2 (two) times daily. Reported on 11/27/2015     montelukast 10  MG tablet  Commonly known as:  SINGULAIR  Take 10 mg by mouth at bedtime.     predniSONE 5 MG tablet  Commonly known as:  DELTASONE  Take 1 tablet by mouth daily.     PROBIOTIC DAILY PO  Take 1 capsule by mouth daily.     verapamil 240 MG CR tablet  Commonly known as:  CALAN-SR  Take 1 tablet by mouth daily.     VIBERZI 75 MG Tabs  Generic drug:  Eluxadoline  Take 1 tablet by mouth daily.     Vitamin D-3 5000 UNITS Tabs  Take 5,000 Units by mouth daily.       Verbal and written Discharge instructions given to the patient. Wound care per Discharge AVS     Follow-up Information    Follow up with Tinnie Gens, MD In 2 weeks.   Specialties:  Vascular Surgery, Interventional Cardiology, Cardiology   Why:  Office will call you to arrange your appt (sent)   Contact information:   [place] [phone]       Signed: Laurence Slate [facility] 12/27/2015, 11:11 AM  --- For VQI Registry use --- Instructions: Press F2 to tab through selections.  Delete question if not applicable.   Modified Rankin score at D/C (0-6): Rankin Score=0  IV medication needed for:  1. Hypertension: No 2. Hypotension: No  Post-op Complications: No  1. Post-op CVA or TIA: No  If yes: Event classification (right eye, left eye, right cortical, left cortical, verterobasilar, other):   If yes: Timing of event (intra-op, <6 hrs post-op, >=6 hrs post-op, unknown):   2. CN injury: No  If yes: CN  injuried   3. Myocardial infarction: No  If yes: Dx by (EKG or clinical, Troponin):   4.  CHF: No  5.  Dysrhythmia (new): No  6. Wound infection: No  7. Reperfusion symptoms: No  8. Return to OR: No  If yes: return to OR for (bleeding, neurologic, other CEA incision, other):   Discharge medications: Statin use:  No  for  medical reason   ASA use:  No  for medical reason   Beta blocker use:  No  for medical reason   ACE-Inhibitor use:  Yes P2Y12 Antagonist use: [x ] None, [ ]  Plavix, [ ]  Plasugrel, [ ]  Ticlopinine, [ ]  Ticagrelor, [ ]  Other, [ ]  No for medical reason, [ ]  Non-compliant, [ ]  Not-indicated Anti-coagulant use:  [x ] None, [ ]  Warfarin, [ ]  Rivaroxaban, [ ]  Dabigatran, [ ]  Other, [ ]  No for medical reason, [ ]  Non-compliant, [ ]  Not-indicated

## 2016-01-01 ENCOUNTER — Ambulatory Visit (INDEPENDENT_AMBULATORY_CARE_PROVIDER_SITE_OTHER): Payer: Self-pay | Admitting: Vascular Surgery

## 2016-01-01 ENCOUNTER — Encounter: Payer: Self-pay | Admitting: Vascular Surgery

## 2016-01-01 VITALS — BP 175/88 | HR 72 | Temp 98.2°F | Ht 63.0 in | Wt 123.6 lb

## 2016-01-01 DIAGNOSIS — I6521 Occlusion and stenosis of right carotid artery: Secondary | ICD-10-CM

## 2016-01-01 DIAGNOSIS — I6529 Occlusion and stenosis of unspecified carotid artery: Secondary | ICD-10-CM | POA: Insufficient documentation

## 2016-01-01 NOTE — Progress Notes (Signed)
Subjective:     Patient ID: Jordan [name], female   DOB: [birth year], 70 y.o.   MRN: [id number]  HPI This 70 year old female returns for initial follow-up regarding right carotid endarterectomy with resection of redundant segment of internal carotid and primary reanastomosis. She had a history of right brain TIAs with transient weakness in the left upper extremity she has had no further symptoms since her surgery. She takes aspirin 81 mg per day. Her COPD has been stable since her surgery. She has no specific complaints. She is swallowin well nd has no hoarseness.   Review of Systems     Objective:   Physical Exam BP 175/88 mmHg  Pulse 72  Temp(Src) 98.2 F (36.8 C) (Oral)  Ht 5\' 3"  (1.6 m)  Wt 123 lb 9.6 oz (56.065 kg)  BMI 21.90 kg/m2  SpO2 97%   well-developed well-nourished female no apparent distress alert and oriented 3 Lungs no rhonchi or wheezing Cardiovascular regular rhythm no murmurs Right neck incision is healed nicely with 3+ pulse and no audible bruits. 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 Neurologic exam is normal.     Assessment:      doing well post right carotid endarterectomy with resection of redundant segment of carotid artery and primary reanastomosis for right brain TIAs -completely resolved     Plan:      we will return in 6 months with follow-up carotid duplex exam and see Dr. Servando Snare unless she develops symptoms in the interim

## 2016-07-01 ENCOUNTER — Encounter: Payer: Self-pay | Admitting: Vascular Surgery

## 2016-07-03 ENCOUNTER — Other Ambulatory Visit: Payer: Self-pay | Admitting: *Deleted

## 2016-07-03 DIAGNOSIS — I6523 Occlusion and stenosis of bilateral carotid arteries: Secondary | ICD-10-CM

## 2016-07-04 ENCOUNTER — Ambulatory Visit (HOSPITAL_COMMUNITY)
Admission: RE | Admit: 2016-07-04 | Discharge: 2016-07-04 | Disposition: A | Discharge status: Home / Self Care | Payer: Medicare Other | Source: Ambulatory Visit | Attending: Vascular Surgery | Admitting: Vascular Surgery

## 2016-07-04 ENCOUNTER — Encounter: Payer: Self-pay | Admitting: Vascular Surgery

## 2016-07-04 ENCOUNTER — Ambulatory Visit (INDEPENDENT_AMBULATORY_CARE_PROVIDER_SITE_OTHER): Payer: Medicare Other | Admitting: Vascular Surgery

## 2016-07-04 VITALS — BP 169/86 | HR 72 | Temp 97.5°F | Resp 16 | Ht 63.0 in | Wt 124.0 lb

## 2016-07-04 DIAGNOSIS — I6522 Occlusion and stenosis of left carotid artery: Secondary | ICD-10-CM | POA: Diagnosis not present

## 2016-07-04 DIAGNOSIS — I6521 Occlusion and stenosis of right carotid artery: Secondary | ICD-10-CM | POA: Diagnosis not present

## 2016-07-04 DIAGNOSIS — I6523 Occlusion and stenosis of bilateral carotid arteries: Secondary | ICD-10-CM

## 2016-07-04 NOTE — Progress Notes (Signed)
Patient ID: Jordan [name], female   DOB: 02-Aug-1946, 70 y.o.   MRN: [id number]  Reason for Consult: Carotid (6 month f/u)   Referred by Jordan Du, MD  Subjective:     HPI:  Jordan [name] is a 70 y.o. female with previous right-sided carotid endarterectomy for TIA. This was performed 6 months ago by Dr. Kellie [name] and she has done very well since that time. She has no further history of TIA and does not have any history of stroke at all. She is back to her regular level of activity does not smoke and does take aspirin daily.  Past Medical History:  Diagnosis Date  . Allergy    seasonal  . Anemia   . Arthritis   . Asthma    uses Advair and Spiriva daily,Albuterol prn.Takes Singulair at bedtime and Prednisone daily  . Back spasm    takes Zanaflex daily as needed  . Blood transfusion 2007   no abnormal reation noted   . Bruises easily    d/t being on Prednisone  . Bursitis    left elbow  . C. difficile colitis    2007: treated with Flagyl and Vanc, 2014: treated with vanc, Aug 2014: Vanc taper  . Chronic back pain   . Chronic kidney disease    "creatine" creeping up - followed at this time by PCP  . Complication of anesthesia 2000   woke up during one surgery- ovary surgery  . COPD (chronic obstructive pulmonary disease) ([place])   . Depression    takes Prozac daily  . H/O hiatal hernia   . Hemorrhoids   . History of bronchitis    last time a yr ago  . History of colon polyps   . History of kidney stones   . History of kidney stones   . Hyperlipidemia    hx of-was on meds but has been off x 1 1/2 yrs  . Hypertension    takes Verapamil nightly  . IBS (irritable bowel syndrome)   . Insomnia    takes Benadryl as needed  . Myocardial infarction 2007   Takotsubo cardiomyopathy  . Nocturia   . Osteoporosis    takes Fosamax weekly  . Pneumonia    MRSA pneumonia in 2007  . Shortness of breath    with exertion daily  . Tingling    and pain in left leg  . Urinary  frequency    Family History  Problem Relation Age of Onset  . Heart disease Father   . Colon cancer Neg Hx    Past Surgical History:  Procedure Laterality Date  . APPENDECTOMY    . CARPAL TUNNEL RELEASE Left   . CARPAL TUNNEL RELEASE Right 09/21/2015   Procedure: CARPAL TUNNEL RELEASE;  Surgeon: Carole Civil, MD;  Location: [facility];  Service: Orthopedics;  Laterality: Right;  . cataract surgery Bilateral   . CHOLECYSTECTOMY    . COLONOSCOPY   09/24/2006   RMR: Normal rectum, left-sided diverticula  . COLONOSCOPY  Oct 2012   Dr. Henrene Pastor: tubular adenomas, moderative diverticulosis, internal hemorrhoids  . CYSTOSCOPY    . ENDARTERECTOMY Right 12/12/2015   Procedure: ENDARTERECTOMY RIGHT CAROTID, RESECTION OF REDUNDANT RIGHT COMMON CAROTID ARTERY WITH PRIMARY REANASTOMOSIS;  Surgeon: Mal Misty, MD;  Location: [place];  Service: Vascular;  Laterality: Right;  . ESOPHAGOGASTRODUODENOSCOPY  04/07/2006   HCW:CBJSEG esophagus s/p placement of Bravo pH probe  . GANGLION CYST EXCISION Right 09/21/2015   Procedure: RIGHT WRIST GANGLION  CYST REMOVAL;  Surgeon: Carole Civil, MD;  Location: [facility];  Service: Orthopedics;  Laterality: Right;  . GASTRIC FUNDOPLICATION     x 2  . hemorrhoidal banding    . HERNIA REPAIR     umbilical  . KNEE ARTHROSCOPY Right   . LUMBAR LAMINECTOMY/DECOMPRESSION MICRODISCECTOMY Left 10/17/2013   Procedure: LUMBAR LAMINECTOMY/DECOMPRESSION MICRODISCECTOMY 1 LEVEL three/four;  Surgeon: Ophelia Charter, MD;  Location: [place] [facility];  Service: Neurosurgery;  Laterality: Left;  . NECK SURGERY     fusion-   . OLECRANON BURSECTOMY Left 01/11/2014   Procedure: OLECRANON BURSECTOMY;  Surgeon: Carole Civil, MD;  Location: [facility];  Service: Orthopedics;  Laterality: Left;  . OOPHORECTOMY Right   . PATCH ANGIOPLASTY Right 12/12/2015   Procedure: PATCH ANGIOPLASTY RIGHT CAROTID ARTERY USING HEMASHIELD PLATINUM FINESSE PATCH;  Surgeon: Mal Misty, MD;  Location:  [place];  Service: Vascular;  Laterality: Right;  . TONSILLECTOMY      Short Social History:  Social History  Substance Use Topics  . Smoking status: Never Smoker  . Smokeless tobacco: Never Used  . Alcohol use 6.0 oz/week    10 Cans of beer per week     Comment: couple of beers daily    Allergies  Allergen Reactions  . Cardura [Doxazosin Mesylate] Hives  . Strawberry Leaves Extract Hives  . Barbiturates Nausea And Vomiting and Rash  . Levofloxacin Rash  . Sulfa Antibiotics Nausea Only    Current Outpatient Prescriptions  Medication Sig Dispense Refill  . albuterol (PROVENTIL HFA;VENTOLIN HFA) 108 (90 BASE) MCG/ACT inhaler Inhale 2 puffs into the lungs every 6 (six) hours as needed for wheezing.    Marland Kitchen alendronate (FOSAMAX) 70 MG tablet Take 70 mg by mouth every Thursday. Take with a full glass of water on an empty stomach.    . Cholecalciferol (VITAMIN D-3) 5000 UNITS TABS Take 5,000 Units by mouth daily.    . diazepam (VALIUM) 5 MG tablet Take 1 tablet (5 mg total) by mouth every 6 (six) hours as needed for muscle spasms. 50 tablet 1  . Eluxadoline (VIBERZI) 75 MG TABS Take 1 tablet by mouth daily.    Marland Kitchen FLUoxetine (PROZAC) 40 MG capsule Take 40 mg by mouth daily.      . folic acid (FOLVITE) 1 MG tablet Take 1 mg by mouth daily.    Marland Kitchen HYDROcodone-acetaminophen (NORCO) 7.5-325 MG tablet Take 1 tablet by mouth every 6 (six) hours as needed for moderate pain. 120 tablet 0  . HYDROcodone-acetaminophen (NORCO) 7.5-325 MG tablet Take 1 tablet by mouth every 6 (six) hours as needed for moderate pain. 20 tablet 0  . lisinopril (PRINIVIL,ZESTRIL) 20 MG tablet Take 20 mg by mouth daily.    . mometasone-formoterol (DULERA) 100-5 MCG/ACT AERO Inhale 1 puff into the lungs 2 (two) times daily. Reported on 11/27/2015    . montelukast (SINGULAIR) 10 MG tablet Take 10 mg by mouth at bedtime.      . predniSONE (DELTASONE) 5 MG tablet Take 1 tablet by mouth daily.  2  . Probiotic Product (PROBIOTIC  DAILY PO) Take 1 capsule by mouth daily.    . verapamil (CALAN-SR) 240 MG CR tablet Take 1 tablet by mouth daily.  11   No current facility-administered medications for this visit.     Review of Systems  Constitutional:  Constitutional negative. HENT: HENT negative.  Eyes: Eyes negative.  Respiratory: Respiratory negative.  Cardiovascular: Cardiovascular negative.  GI: Gastrointestinal negative.  Musculoskeletal: Musculoskeletal  negative.  Skin: Skin negative.  Neurological: Neurological negative.       Objective:  Objective   Vitals:   07/04/16 1221 07/04/16 1224 07/04/16 1225  BP: (!) 175/78 (!) 182/84 (!) 169/86  Pulse: 72 72 72  Resp: 16    Temp: 97.5 F (36.4 C)    SpO2: 99%    Weight: 124 lb (56.2 kg)    Height: 5\' 3"  (1.6 m)     Body mass index is 21.97 kg/m.  Physical Exam  Constitutional: She is oriented to person, place, and time. She appears well-developed.  HENT:  Head: Atraumatic.  Eyes: EOM are normal.  Neck: Normal range of motion. Neck supple. No thyromegaly present.  Cardiovascular: Normal rate.   Pulses:      Carotid pulses are 2+ on the right side, and 2+ on the left side.      Radial pulses are 2+ on the right side, and 2+ on the left side.       Popliteal pulses are 2+ on the right side, and 2+ on the left side.  Pulmonary/Chest: Effort normal.  Abdominal: Soft. She exhibits no mass.  Musculoskeletal: Normal range of motion. She exhibits no edema.  Lymphadenopathy:    She has no cervical adenopathy.  Neurological: She is alert and oriented to person, place, and time.  Skin: Skin is warm and dry.  Psychiatric: She has a normal mood and affect. Her behavior is normal. Judgment and thought content normal.    Data: Patent right carotid endarterectomy site with mild hyperplasia. Left ICA stenosis less than 40%.     Assessment/Plan:     70 year old white female status post right carotid endarterectomy for TIA. She is doing very well in the  six-month follow-up check. We will see her back in 6 months again with repeat studies should she not have issues before that. She is to continue daily aspirin.     Waynetta Sandy MD [facility]

## 2016-07-04 NOTE — Progress Notes (Signed)
Vitals:   07/04/16 1221 07/04/16 1224  BP: (!) 175/78 (!) 182/84  Pulse: 72 72  Resp: 16   Temp: 97.5 F (36.4 C)   SpO2: 99%   Weight: 124 lb (56.2 kg)   Height: 5\' 3"  (1.6 m)

## 2016-08-19 ENCOUNTER — Encounter: Payer: Self-pay | Admitting: Internal Medicine

## 2016-12-22 ENCOUNTER — Other Ambulatory Visit (HOSPITAL_COMMUNITY): Payer: Self-pay | Admitting: Pulmonary Disease

## 2016-12-22 ENCOUNTER — Ambulatory Visit (HOSPITAL_COMMUNITY)
Admission: RE | Admit: 2016-12-22 | Discharge: 2016-12-22 | Disposition: A | Discharge status: Home / Self Care | Payer: Medicare Other | Source: Ambulatory Visit | Attending: Pulmonary Disease | Admitting: Pulmonary Disease

## 2016-12-22 DIAGNOSIS — M47816 Spondylosis without myelopathy or radiculopathy, lumbar region: Secondary | ICD-10-CM | POA: Diagnosis not present

## 2016-12-22 DIAGNOSIS — Z9049 Acquired absence of other specified parts of digestive tract: Secondary | ICD-10-CM | POA: Insufficient documentation

## 2016-12-22 DIAGNOSIS — M545 Low back pain: Secondary | ICD-10-CM

## 2016-12-22 DIAGNOSIS — M5440 Lumbago with sciatica, unspecified side: Secondary | ICD-10-CM | POA: Insufficient documentation

## 2016-12-31 ENCOUNTER — Encounter: Payer: Self-pay | Admitting: Family

## 2017-01-06 ENCOUNTER — Other Ambulatory Visit: Payer: Self-pay | Admitting: Vascular Surgery

## 2017-01-06 DIAGNOSIS — Z9889 Other specified postprocedural states: Secondary | ICD-10-CM

## 2017-01-09 ENCOUNTER — Ambulatory Visit (INDEPENDENT_AMBULATORY_CARE_PROVIDER_SITE_OTHER): Payer: Medicare Other | Admitting: Family

## 2017-01-09 ENCOUNTER — Ambulatory Visit (HOSPITAL_COMMUNITY)
Admission: RE | Admit: 2017-01-09 | Discharge: 2017-01-09 | Disposition: A | Discharge status: Home / Self Care | Payer: Medicare Other | Source: Ambulatory Visit | Attending: Family | Admitting: Family

## 2017-01-09 ENCOUNTER — Encounter: Payer: Self-pay | Admitting: Family

## 2017-01-09 VITALS — BP 157/81 | HR 68 | Temp 98.1°F | Resp 16 | Ht 63.0 in | Wt 120.0 lb

## 2017-01-09 DIAGNOSIS — I6522 Occlusion and stenosis of left carotid artery: Secondary | ICD-10-CM | POA: Insufficient documentation

## 2017-01-09 DIAGNOSIS — I6523 Occlusion and stenosis of bilateral carotid arteries: Secondary | ICD-10-CM

## 2017-01-09 DIAGNOSIS — Z9889 Other specified postprocedural states: Secondary | ICD-10-CM

## 2017-01-09 LAB — VAS US CAROTID
LEFT ECA DIAS: -14 cm/s
Left CCA dist dias: 19 cm/s
Left CCA dist sys: 74 cm/s
Left CCA prox dias: 15 cm/s
Left CCA prox sys: 85 cm/s
Left ICA dist dias: -26 cm/s
Left ICA dist sys: -78 cm/s
Left ICA prox dias: 35 cm/s
Left ICA prox sys: 133 cm/s
RIGHT CCA MID DIAS: 17 cm/s
RIGHT ECA DIAS: -16 cm/s
Right CCA prox dias: 18 cm/s
Right CCA prox sys: 61 cm/s
Right cca dist sys: -96 cm/s

## 2017-01-09 NOTE — Patient Instructions (Signed)
Stroke Prevention Some medical conditions and behaviors are associated with an increased chance of having a stroke. You may prevent a stroke by making healthy choices and managing medical conditions. How can I reduce my risk of having a stroke?  Stay physically active. Get at least 30 minutes of activity on most or all days.  Do not smoke. It may also be helpful to avoid exposure to secondhand smoke.  Limit alcohol use. Moderate alcohol use is considered to be:  No more than 2 drinks per day for men.  No more than 1 drink per day for nonpregnant women.  Eat healthy foods. This involves:  Eating 5 or more servings of fruits and vegetables a day.  Making dietary changes that address high blood pressure (hypertension), high cholesterol, diabetes, or obesity.  Manage your cholesterol levels.  Making food choices that are high in fiber and low in saturated fat, trans fat, and cholesterol may control cholesterol levels.  Take any prescribed medicines to control cholesterol as directed by your health care provider.  Manage your diabetes.  Controlling your carbohydrate and sugar intake is recommended to manage diabetes.  Take any prescribed medicines to control diabetes as directed by your health care provider.  Control your hypertension.  Making food choices that are low in salt (sodium), saturated fat, trans fat, and cholesterol is recommended to manage hypertension.  Ask your health care provider if you need treatment to lower your blood pressure. Take any prescribed medicines to control hypertension as directed by your health care provider.  If you are 18-39 years of age, have your blood pressure checked every 3-5 years. If you are 40 years of age or older, have your blood pressure checked every year.  Maintain a healthy weight.  Reducing calorie intake and making food choices that are low in sodium, saturated fat, trans fat, and cholesterol are recommended to manage  weight.  Stop drug abuse.  Avoid taking birth control pills.  Talk to your health care provider about the risks of taking birth control pills if you are over 35 years old, smoke, get migraines, or have ever had a blood clot.  Get evaluated for sleep disorders (sleep apnea).  Talk to your health care provider about getting a sleep evaluation if you snore a lot or have excessive sleepiness.  Take medicines only as directed by your health care provider.  For some people, aspirin or blood thinners (anticoagulants) are helpful in reducing the risk of forming abnormal blood clots that can lead to stroke. If you have the irregular heart rhythm of atrial fibrillation, you should be on a blood thinner unless there is a good reason you cannot take them.  Understand all your medicine instructions.  Make sure that other conditions (such as anemia or atherosclerosis) are addressed. Get help right away if:  You have sudden weakness or numbness of the face, arm, or leg, especially on one side of the body.  Your face or eyelid droops to one side.  You have sudden confusion.  You have trouble speaking (aphasia) or understanding.  You have sudden trouble seeing in one or both eyes.  You have sudden trouble walking.  You have dizziness.  You have a loss of balance or coordination.  You have a sudden, severe headache with no known cause.  You have new chest pain or an irregular heartbeat. Any of these symptoms may represent a serious problem that is an emergency. Do not wait to see if the symptoms will go away.   Get medical help at once. Call your local emergency services (911 in [place] Do not drive yourself to the hospital. This information is not intended to replace advice given to you by your health care provider. Make sure you discuss any questions you have with your health care provider. Document Released: 10/02/2004 Document Revised: 01/31/2016 Document Reviewed: 02/25/2013 Elsevier  Interactive Patient Education  2017 [employer]  

## 2017-01-09 NOTE — Progress Notes (Signed)
Chief Complaint: Follow up Extracranial Carotid Artery Stenosis   History of Present Illness  Jordan [name] is a 71 y.o. female who is s/p right-sided carotid endarterectomy on 12-12-15 by Dr. Kellie Simmering.  She reports that in 2006 she had a TIA while hospitalized for asthma exacerbation.  She has no further history of TIA and does not have any history of stroke at all.  Dr. Donzetta Matters evaluated pt on 07-04-16. At that time she was back to her regular level of activity. She is taking hydrocodone and will be evaluated by an orthopod soon for 2 weeks history of right hip pain and low back pain.  She denies claudication symptoms with walking.   Pt Diabetic: no Pt smoker: non-smoker  Pt meds include: Statin : no, states that simvastatin was associated with causing a heart issue for her ASA: yes Other anticoagulants/antiplatelets: no   Past Medical History:  Diagnosis Date  . Allergy    seasonal  . Anemia   . Arthritis   . Asthma    uses Advair and Spiriva daily,Albuterol prn.Takes Singulair at bedtime and Prednisone daily  . Back spasm    takes Zanaflex daily as needed  . Blood transfusion 2007   no abnormal reation noted   . Bruises easily    d/t being on Prednisone  . Bursitis    left elbow  . C. difficile colitis    2007: treated with Flagyl and Vanc, 2014: treated with vanc, Aug 2014: Vanc taper  . Chronic back pain   . Chronic kidney disease    "creatine" creeping up - followed at this time by PCP  . Complication of anesthesia 2000   woke up during one surgery- ovary surgery  . COPD (chronic obstructive pulmonary disease) ([place])   . Depression    takes Prozac daily  . H/O hiatal hernia   . Hemorrhoids   . History of bronchitis    last time a yr ago  . History of colon polyps   . History of kidney stones   . History of kidney stones   . Hyperlipidemia    hx of-was on meds but has been off x 1 1/2 yrs  . Hypertension    takes Verapamil nightly  . IBS (irritable bowel  syndrome)   . Insomnia    takes Benadryl as needed  . 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 Nocturia   . Osteoporosis    takes Fosamax weekly  . Pneumonia    MRSA pneumonia in 2007  . Shortness of breath    with exertion daily  . Tingling    and pain in left leg  . Urinary frequency     Social History Social History  Substance Use Topics  . Smoking status: Never Smoker  . Smokeless tobacco: Never Used  . Alcohol use 6.0 oz/week    10 Cans of beer per week     Comment: couple of beers daily    Family History Family History  Problem Relation Age of Onset  . Heart disease Father   . Colon cancer Neg Hx     Surgical History Past Surgical History:  Procedure Laterality Date  . APPENDECTOMY    . CARPAL TUNNEL RELEASE Left   . CARPAL TUNNEL RELEASE Right 09/21/2015   Procedure: CARPAL TUNNEL RELEASE;  Surgeon: Carole Civil, MD;  [facility]: [facility];  Service: Orthopedics;  Laterality: Right;  . cataract surgery Bilateral   . CHOLECYSTECTOMY    .  COLONOSCOPY   09/24/2006   RMR: Normal rectum, left-sided diverticula  . COLONOSCOPY  Oct 2012   Dr. Henrene Pastor: tubular adenomas, moderative diverticulosis, internal hemorrhoids  . CYSTOSCOPY    . ENDARTERECTOMY Right 12/12/2015   Procedure: ENDARTERECTOMY RIGHT CAROTID, RESECTION OF REDUNDANT RIGHT COMMON CAROTID ARTERY WITH PRIMARY REANASTOMOSIS;  Surgeon: Mal Misty, MD;  Location: [place];  Service: Vascular;  Laterality: Right;  . ESOPHAGOGASTRODUODENOSCOPY  04/07/2006   MWN:[id number] esophagus s/p placement of Bravo pH probe  . GANGLION CYST EXCISION Right 09/21/2015   Procedure: RIGHT WRIST GANGLION CYST REMOVAL;  Surgeon: Carole Civil, MD;  Location: [facility];  Service: Orthopedics;  Laterality: Right;  . GASTRIC FUNDOPLICATION     x 2  . hemorrhoidal banding    . HERNIA REPAIR     umbilical  . KNEE ARTHROSCOPY Right   . LUMBAR LAMINECTOMY/DECOMPRESSION MICRODISCECTOMY Left 10/17/2013    Procedure: LUMBAR LAMINECTOMY/DECOMPRESSION MICRODISCECTOMY 1 LEVEL three/four;  Surgeon: Ophelia Charter, MD;  Location: [place] [facility];  Service: Neurosurgery;  Laterality: Left;  . NECK SURGERY     fusion-   . OLECRANON BURSECTOMY Left 01/11/2014   Procedure: OLECRANON BURSECTOMY;  Surgeon: Carole Civil, MD;  [facility];  Service: Orthopedics;  Laterality: Left;  . OOPHORECTOMY Right   . PATCH ANGIOPLASTY Right 12/12/2015   Procedure: PATCH ANGIOPLASTY RIGHT CAROTID ARTERY USING HEMASHIELD PLATINUM FINESSE PATCH;  Surgeon: Mal Misty, MD;  Location: [place];  Service: Vascular;  Laterality: Right;  . TONSILLECTOMY      Allergies  Allergen Reactions  . Cardura [Doxazosin Mesylate] Hives  . Strawberry Leaves Extract Hives  . Barbiturates Nausea And Vomiting and Rash  . Levofloxacin Rash  . Sulfa Antibiotics Nausea Only    Current Outpatient Prescriptions  Medication Sig Dispense Refill  . albuterol (PROVENTIL HFA;VENTOLIN HFA) 108 (90 BASE) MCG/ACT inhaler Inhale 2 puffs into the lungs every 6 (six) hours as needed for wheezing.    Marland Kitchen alendronate (FOSAMAX) 70 MG tablet Take 70 mg by mouth every Thursday. Take with a full glass of water on an empty stomach.    . Cholecalciferol (VITAMIN D-3) 5000 UNITS TABS Take 5,000 Units by mouth daily.    . diazepam (VALIUM) 5 MG tablet Take 1 tablet (5 mg total) by mouth every 6 (six) hours as needed for muscle spasms. 50 tablet 1  . Eluxadoline (VIBERZI) 75 MG TABS Take 1 tablet by mouth daily.    Marland Kitchen FLUoxetine (PROZAC) 40 MG capsule Take 40 mg by mouth daily.      . folic acid (FOLVITE) 1 MG tablet Take 1 mg by mouth daily.    Marland Kitchen HYDROcodone-acetaminophen (NORCO) 7.5-325 MG tablet Take 1 tablet by mouth every 6 (six) hours as needed for moderate pain. 120 tablet 0  . lisinopril (PRINIVIL,ZESTRIL) 20 MG tablet Take 20 mg by mouth daily.    . mometasone-formoterol (DULERA) 100-5 MCG/ACT AERO Inhale 1 puff into the lungs 2 (two) times daily.  Reported on 11/27/2015    . montelukast (SINGULAIR) 10 MG tablet Take 10 mg by mouth at bedtime.      . predniSONE (DELTASONE) 5 MG tablet Take 1 tablet by mouth daily.  2  . Probiotic Product (PROBIOTIC DAILY PO) Take 1 capsule by mouth daily.    . verapamil (CALAN-SR) 240 MG CR tablet Take 1 tablet by mouth daily.  11   No current facility-administered medications for this visit.     Review of Systems : See HPI  for pertinent positives and negatives.  Physical Examination  Vitals:   01/09/17 1520 01/09/17 1523 01/09/17 1525  BP: (!) 165/74 (!) 177/83 (!) 157/81  Pulse: 69 68 68  Resp: 16    Temp: 98.1 F (36.7 C)    SpO2: 97%    Weight: 120 lb (54.4 kg)    Height: 5\' 3"  (1.6 m)     Body mass index is 21.26 kg/m.  General: WDWN female in NAD GAIT: antalgic Eyes: PERRLA Pulmonary:  Respirations are non-labored, somewhat limited air movement, CTAB, no rales, rhonchi, or wheezing.  Cardiac: regular rhythm, no detected murmur.  VASCULAR EXAM Carotid Bruits Right Left   Negative Negative    Aorta is not palpable. Radial pulses are 2+ palpable and equal.                                                                                                                            LE Pulses Right Left       POPLITEAL  not palpable   not palpable       POSTERIOR TIBIAL   palpable    palpable        DORSALIS PEDIS      ANTERIOR TIBIAL  palpable  palpable     Gastrointestinal: soft, nontender, BS WNL, no r/g, no palpable masses.  Musculoskeletal: No muscle atrophy/wasting. M/S 5/5 throughout, extremities without ischemic changes.  Neurologic: A&O X 3; Appropriate Affect, Speech is normal CN 2-12 intact, pain and light touch intact in extremities, Motor exam as listed above.     Assessment: MALAN WERK is a 71 y.o. female who s/p right-sided carotid endarterectomy on 12-12-15. She had a TIA in 2006, no TIA or stroke subsequently.   She has never used tobacco and does  not have DM. She does have asthma.   DATA (01/09/17): Carotid duplex: Right CEA site with mild hyperplasia. Left ICA with <40% stenosis (close to 40%). Bilateral vertebral artery flow is antegrade.  Bilateral subclavian artery waveforms are normal.  No significant change compared to the last exam on 07-04-16.   Plan: Follow-up in 1 year with Carotid Duplex scan.   I discussed in depth with the patient the nature of atherosclerosis, and emphasized the importance of maximal medical management including strict control of blood pressure, blood glucose, and lipid levels, obtaining regular exercise, and continued cessation of smoking.  The patient is aware that without maximal medical management the underlying atherosclerotic disease process will progress, limiting the benefit of any interventions. The patient was given information about stroke prevention and what symptoms should prompt the patient to seek immediate medical care. Thank you for allowing [place] to participate in this patient's care.  Clemon Chambers, RN, MSN, FNP-C [facility] Office: [phone]  Clinic Physician: Donzetta Matters  01/09/17 3:30 PM

## 2017-01-15 NOTE — Addendum Note (Signed)
Addended by: Lianne Cure A on: 01/15/2017 09:42 AM   Modules accepted: Orders

## 2017-02-20 ENCOUNTER — Ambulatory Visit (INDEPENDENT_AMBULATORY_CARE_PROVIDER_SITE_OTHER): Payer: Medicare Other | Admitting: Gastroenterology

## 2017-02-20 ENCOUNTER — Other Ambulatory Visit: Payer: Self-pay

## 2017-02-20 ENCOUNTER — Encounter: Payer: Self-pay | Admitting: Gastroenterology

## 2017-02-20 VITALS — BP 125/71 | HR 94 | Temp 97.6°F | Ht 63.0 in | Wt 115.4 lb

## 2017-02-20 DIAGNOSIS — K529 Noninfective gastroenteritis and colitis, unspecified: Secondary | ICD-10-CM

## 2017-02-20 DIAGNOSIS — Z86010 Personal history of colonic polyps: Secondary | ICD-10-CM

## 2017-02-20 DIAGNOSIS — R634 Abnormal weight loss: Secondary | ICD-10-CM | POA: Insufficient documentation

## 2017-02-20 DIAGNOSIS — D649 Anemia, unspecified: Secondary | ICD-10-CM | POA: Diagnosis not present

## 2017-02-20 MED ORDER — PEG 3350-KCL-NA BICARB-NACL 420 G PO SOLR: 4000.0000 mL | ORAL | 0 refills | Status: DC

## 2017-02-20 NOTE — Assessment & Plan Note (Signed)
Labs as planned. At a minimum she'll get a colonoscopy in the near future. See chronic diarrhea.

## 2017-02-20 NOTE — Progress Notes (Signed)
Primary Care Physician:  Sinda Du, MD  Primary Gastroenterologist:  Garfield Cornea, MD   Chief Complaint  Patient presents with  . Diarrhea    HPI:  Jordan [name] is a 71 y.o. female here to schedule Surveillance colonoscopy. She is also concerned about anemia, diarrhea, abnormal weight loss. We last saw her, hemorrhoid banding in April 2015. She has a history of IBS, recurrent C. difficile (last evaluated 2014). She is also had [employer] fundoplication with redo several years ago.  She presents complaining of 20 pound weight loss. States her appetite is good. Eats all the time. Only rarely has dysphagia. Denies nausea or vomiting. No heartburn. She complains of postprandial urgency and loose stools. Symptoms worsening. Never has constipation. Rarely has solid stool. Wearing depends. No nocturnal diarrhea. No melena rectal bleeding. She is status post cholecystectomy. She tells me she gets Viberzi samples at times from PCP, we have advised her that she no longer can take this medication given to label warning regarding patients who have previously had her gallbladder out. She voiced understanding. Previously tried Surfaxin without relief, also cost her $20 a pill she cannot afford it. She is on chronic prednisone for her lung disease. She states they have been tapering her off. She takes ibuprofen about 800 mg every other day. No melena rectal bleeding. No abdominal pain.  Currently not taking anything for the diarrhea.  Last colonoscopy October 2012, tubular adenomas removed from the proximal transverse colon, moderate diverticulosis and hemorrhoids. Last EGD 2007 normal esophagus, some surgical changes but not typical of what is seeb with fundoplication.  Current Outpatient Prescriptions  Medication Sig Dispense Refill  . albuterol (PROVENTIL HFA;VENTOLIN HFA) 108 (90 BASE) MCG/ACT inhaler Inhale 2 puffs into the lungs every 6 (six) hours as needed for wheezing.    Marland Kitchen alendronate (FOSAMAX)  70 MG tablet Take 70 mg by mouth every Thursday. Take with a full glass of water on an empty stomach.    . Cholecalciferol (VITAMIN D-3) 5000 UNITS TABS Take 5,000 Units by mouth daily.    . diazepam (VALIUM) 5 MG tablet Take 1 tablet (5 mg total) by mouth every 6 (six) hours as needed for muscle spasms. 50 tablet 1  . FLUoxetine (PROZAC) 40 MG capsule Take 40 mg by mouth daily.      . folic acid (FOLVITE) 1 MG tablet Take 1 mg by mouth daily.    Marland Kitchen HYDROcodone-acetaminophen (NORCO) 7.5-325 MG tablet Take 1 tablet by mouth every 6 (six) hours as needed for moderate pain. 120 tablet 0  . ibuprofen (ADVIL,MOTRIN) 800 MG tablet Take 800 mg by mouth every other day.    . lisinopril (PRINIVIL,ZESTRIL) 20 MG tablet Take 20 mg by mouth daily.    . mometasone-formoterol (DULERA) 100-5 MCG/ACT AERO Inhale 1 puff into the lungs 2 (two) times daily. Reported on 11/27/2015    . montelukast (SINGULAIR) 10 MG tablet Take 10 mg by mouth at bedtime.      . predniSONE (DELTASONE) 5 MG tablet Take 1 tablet by mouth daily.  2  . Probiotic Product (PROBIOTIC DAILY PO) Take 1 capsule by mouth daily.     No current facility-administered medications for this visit.     Allergies as of 02/20/2017 - Review Complete 02/20/2017  Allergen Reaction Noted  . Cardura [doxazosin mesylate] Hives 09/14/2013  . Strawberry leaves extract Hives 12/11/2015  . Barbiturates Nausea And Vomiting and Rash 05/04/2009  . Levofloxacin Rash 12/06/2015  . Sulfa antibiotics Nausea Only 07/07/2011  Past Medical History:  Diagnosis Date  . Allergy    seasonal  . Anemia   . Arthritis   . Asthma    uses Advair and Spiriva daily,Albuterol prn.Takes Singulair at bedtime and Prednisone daily  . Back spasm    takes Zanaflex daily as needed  . Blood transfusion 2007   no abnormal reation noted   . Bruises easily    d/t being on Prednisone  . Bursitis    left elbow  . C. difficile colitis    2007: treated with Flagyl and Vanc, 2014:  treated with vanc, Aug 2014: Vanc taper  . Chronic back pain   . Chronic kidney disease    "creatine" creeping up - followed at this time by PCP  . Complication of anesthesia 2000   woke up during one surgery- ovary surgery  . COPD (chronic obstructive pulmonary disease) ([place])   . Depression    takes Prozac daily  . H/O hiatal hernia   . Hemorrhoids   . History of bronchitis    last time a yr ago  . History of colon polyps   . History of kidney stones   . History of kidney stones   . Hyperlipidemia    hx of-was on meds but has been off x 1 1/2 yrs  . Hypertension    takes Verapamil nightly  . IBS (irritable bowel syndrome)   . Insomnia    takes Benadryl as needed  . Myocardial infarction [facility]) 2007   Takotsubo cardiomyopathy  . Nocturia   . Osteoporosis    takes Fosamax weekly  . Pneumonia    MRSA pneumonia in 2007  . Shortness of breath    with exertion daily  . Tingling    and pain in left leg  . Urinary frequency     Past Surgical History:  Procedure Laterality Date  . APPENDECTOMY    . CARPAL TUNNEL RELEASE Left   . CARPAL TUNNEL RELEASE Right 09/21/2015   Procedure: CARPAL TUNNEL RELEASE;  Surgeon: Carole Civil, MD;  Location: [facility];  Service: Orthopedics;  Laterality: Right;  . cataract surgery Bilateral   . CHOLECYSTECTOMY    . COLONOSCOPY   09/24/2006   RMR: Normal rectum, left-sided diverticula  . COLONOSCOPY  Oct 2012   Dr. Henrene Pastor: tubular adenomas, moderative diverticulosis, internal hemorrhoids  . CYSTOSCOPY    . ENDARTERECTOMY Right 12/12/2015   Procedure: ENDARTERECTOMY RIGHT CAROTID, RESECTION OF REDUNDANT RIGHT COMMON CAROTID ARTERY WITH PRIMARY REANASTOMOSIS;  Surgeon: Mal Misty, MD;  Location: [place];  Service: Vascular;  Laterality: Right;  . ESOPHAGOGASTRODUODENOSCOPY  04/07/2006   JME:QASTMH esophagus s/p placement of Bravo pH probe  . GANGLION CYST EXCISION Right 09/21/2015   Procedure: RIGHT WRIST GANGLION CYST REMOVAL;  Surgeon:  Carole Civil, MD;  Location: [facility];  Service: Orthopedics;  Laterality: Right;  . hemorrhoidal banding    . HERNIA REPAIR     umbilical  . KNEE ARTHROSCOPY Right   . LUMBAR LAMINECTOMY/DECOMPRESSION MICRODISCECTOMY Left 10/17/2013   Procedure: LUMBAR LAMINECTOMY/DECOMPRESSION MICRODISCECTOMY 1 LEVEL three/four;  Surgeon: Ophelia Charter, MD;  Location: [place] [facility];  Service: Neurosurgery;  Laterality: Left;  . NECK SURGERY     fusion-   . NISSEN FUNDOPLICATION     x 2  . OLECRANON BURSECTOMY Left 01/11/2014   Procedure: OLECRANON BURSECTOMY;  Surgeon: Carole Civil, MD;  [facility];  Service: Orthopedics;  Laterality: Left;  . OOPHORECTOMY Right   . PATCH ANGIOPLASTY  Right 12/12/2015   Procedure: PATCH ANGIOPLASTY RIGHT CAROTID ARTERY USING [place];  Surgeon: Mal Misty, MD;  Location: [place];  Service: Vascular;  Laterality: Right;  . TONSILLECTOMY      Family History  Problem Relation Age of Onset  . Heart disease Father   . Colon cancer Neg Hx     Social History   Social History  . Marital status: Divorced    Spouse name: N/A  . Number of children: N/A  . Years of education: N/A   Occupational History  . Not on file.   Social History Main Topics  . Smoking status: Never Smoker  . Smokeless tobacco: Never Used  . Alcohol use 6.0 oz/week    10 Cans of beer per week     Comment: couple of beers daily  . Drug use: No  . Sexual activity: Not on file   Other Topics Concern  . Not on file   Social History Narrative  . No narrative on file      ROS:  General: Negative for anorexia, fever, chills, fatigue, weakness.See history of present illness Eyes: Negative for vision changes.  ENT: Negative for hoarseness, difficulty swallowing , nasal congestion. See history of present illness CV: Negative for chest pain, angina, palpitations,  peripheral edema.  Respiratory: Negative for dyspnea at rest,  cough, sputum, wheezing.  Positive DOE GI: See history of present illness. GU:  Negative for dysuria, hematuria, urinary incontinence. Frequent urination with nocturia MS: Negative for joint pain, low back pain.  Derm: Negative for rash or itching.  Neuro: Negative for weakness, abnormal sensation, seizure, frequent headaches, memory loss, confusion.  Psych: Negative for anxiety, depression, suicidal ideation, hallucinations.  Endo: See history of present illness  Heme: Negative for bruising or bleeding. Allergy: Negative for rash or hives.    Physical Examination:  BP 125/71   Pulse 94   Temp 97.6 F (36.4 C) (Oral)   Ht 5\' 3"  (1.6 m)   Wt 115 lb 6.4 oz (52.3 kg)   BMI 20.44 kg/m    General: Thin elderly female in no acute distress. Somewhat tanned appearance.   Head: Normocephalic, atraumatic.   Eyes: Conjunctiva pink, no icterus. Mouth: Oropharyngeal mucosa moist and pink , no lesions erythema or exudate. Neck: Supple without thyromegaly, masses, or lymphadenopathy.  Lungs: Clear to auscultation bilaterally.  Heart: Regular rate and rhythm, no murmurs rubs or gallops.  Abdomen: Bowel sounds are normal, nontender, nondistended, no hepatosplenomegaly or masses, no abdominal bruits or    hernia , no rebound or guarding.  No masses but she has a fullness in the right upper quadrant of unclear significance. Rectal: Deferred Extremities: No lower extremity edema. No clubbing or deformities.  Neuro: Alert and oriented x 4 , grossly normal neurologically.  Skin: Warm and dry, no rash or jaundice.   Psych: Alert and cooperative, normal mood and affect.  Labs: Lab Results  Component Value Date   CREATININE 1.39 (H) 12/13/2015   BUN 18 12/13/2015   NA 137 12/13/2015   K 4.2 12/13/2015   CL 105 12/13/2015   CO2 23 12/13/2015   Lab Results  Component Value Date   ALT 23 12/07/2015   AST 26 12/07/2015   ALKPHOS 59 12/07/2015   BILITOT 0.6 12/07/2015   Lab Results  Component Value Date   WBC 9.8  12/13/2015   HGB 8.0 (L) 12/13/2015   HCT 25.2 (L) 12/13/2015   MCV 95.1 12/13/2015   PLT  173 12/13/2015   No results found for: TSH   Imaging Studies: No results found.

## 2017-02-20 NOTE — Assessment & Plan Note (Addendum)
71 year old female with history of IBS, history of refractory C. difficile presents with worsening diarrhea. Check stool studies. Update labs. Plan for colonoscopy in the OR with Dr. Gala Romney in the near future not only for change in bowel habits but also for history of tubular adenomas, unexplained weight loss.  I have discussed the risks, alternatives, benefits with regards to but not limited to the risk of reaction to medication, bleeding, infection, perforation and the patient is agreeable to proceed. Written consent to be obtained.   DR. Gala Romney TO EXAMINE PATIENT DAY OF TCS FOR FULLNESS IN RUQ OF UNCLEAR SIGNIFICANCE.

## 2017-02-20 NOTE — Assessment & Plan Note (Signed)
Check labs today.

## 2017-02-20 NOTE — Patient Instructions (Signed)
1. Please have your labs and stool test done.  2. Colonoscopy as scheduled. See separate instructions.

## 2017-02-20 NOTE — Progress Notes (Signed)
cc'ed to pcp °

## 2017-02-23 ENCOUNTER — Telehealth: Payer: Self-pay

## 2017-02-23 ENCOUNTER — Other Ambulatory Visit: Payer: Self-pay

## 2017-02-23 ENCOUNTER — Other Ambulatory Visit: Payer: Self-pay | Admitting: Gastroenterology

## 2017-02-23 DIAGNOSIS — K529 Noninfective gastroenteritis and colitis, unspecified: Secondary | ICD-10-CM

## 2017-02-23 DIAGNOSIS — D649 Anemia, unspecified: Secondary | ICD-10-CM

## 2017-02-23 DIAGNOSIS — R634 Abnormal weight loss: Secondary | ICD-10-CM

## 2017-02-23 DIAGNOSIS — Z86010 Personal history of colonic polyps: Secondary | ICD-10-CM

## 2017-02-23 LAB — COMPREHENSIVE METABOLIC PANEL
ALT: 10 U/L (ref 6–29)
AST: 16 U/L (ref 10–35)
Albumin: 3.6 g/dL (ref 3.6–5.1)
Alkaline Phosphatase: 52 U/L (ref 33–130)
BUN: 21 mg/dL (ref 7–25)
CO2: 27 mmol/L (ref 20–31)
Calcium: 9.2 mg/dL (ref 8.6–10.4)
Chloride: 104 mmol/L (ref 98–110)
Creat: 1.61 mg/dL — ABNORMAL HIGH (ref 0.60–0.93)
Glucose, Bld: 88 mg/dL (ref 65–99)
Potassium: 4.9 mmol/L (ref 3.5–5.3)
Sodium: 140 mmol/L (ref 135–146)
Total Bilirubin: 0.4 mg/dL (ref 0.2–1.2)
Total Protein: 6.6 g/dL (ref 6.1–8.1)

## 2017-02-23 LAB — CBC WITH DIFFERENTIAL/PLATELET
Basophils Absolute: 136 cells/uL (ref 0–200)
Basophils Relative: 2 %
Eosinophils Absolute: 1088 cells/uL — ABNORMAL HIGH (ref 15–500)
Eosinophils Relative: 16 %
HCT: 30.6 % — ABNORMAL LOW (ref 35.0–45.0)
Hemoglobin: 10.1 g/dL — ABNORMAL LOW (ref 11.7–15.5)
Lymphocytes Relative: 25 %
Lymphs Abs: 1700 cells/uL (ref 850–3900)
MCH: 30.9 pg (ref 27.0–33.0)
MCHC: 33 g/dL (ref 32.0–36.0)
MCV: 93.6 fL (ref 80.0–100.0)
MPV: 10.4 fL (ref 7.5–12.5)
Monocytes Absolute: 680 cells/uL (ref 200–950)
Monocytes Relative: 10 %
Neutro Abs: 3196 cells/uL (ref 1500–7800)
Neutrophils Relative %: 47 %
Platelets: 264 10*3/uL (ref 140–400)
RBC: 3.27 MIL/uL — ABNORMAL LOW (ref 3.80–5.10)
RDW: 13.5 % (ref 11.0–15.0)
WBC: 6.8 10*3/uL (ref 3.8–10.8)

## 2017-02-23 LAB — LIPASE: Lipase: 19 U/L (ref 7–60)

## 2017-02-23 LAB — TSH: TSH: 2.41 mIU/L

## 2017-02-23 NOTE — Telephone Encounter (Signed)
Tried to call pt, LMOVM and informed of pre-op appt 04/06/17 at 11:00am. Letter also mailed.

## 2017-02-24 LAB — TISSUE TRANSGLUTAMINASE, IGA: Tissue Transglutaminase Ab, IgA: 1 U/mL (ref <4)

## 2017-02-24 LAB — CLOSTRIDIUM DIFFICILE BY PCR: Toxigenic C. Difficile by PCR: NOT DETECTED

## 2017-02-24 LAB — IGA: IgA: 154 mg/dL (ref 81–463)

## 2017-02-26 LAB — GASTROINTESTINAL PATHOGEN PANEL PCR
C. difficile Tox A/B, PCR: NOT DETECTED
Campylobacter, PCR: NOT DETECTED
Cryptosporidium, PCR: NOT DETECTED
E coli (ETEC) LT/ST PCR: NOT DETECTED
E coli (STEC) stx1/stx2, PCR: NOT DETECTED
E coli 0157, PCR: NOT DETECTED
Giardia lamblia, PCR: NOT DETECTED
Norovirus, PCR: NOT DETECTED
Rotavirus A, PCR: NOT DETECTED
Salmonella, PCR: NOT DETECTED
Shigella, PCR: NOT DETECTED

## 2017-02-26 NOTE — Progress Notes (Signed)
Stool test were negative. Celiac screen negative. Thyroid function normal.  Creatinine (kidney number) up but stable.  Hgb down, may in part be due to renal disease.  Await tcs. Repeat cbc in four weeks.   Can we add on iron/tibc, ferritin?  Doris, can you address in Julie's absence.

## 2017-02-27 ENCOUNTER — Other Ambulatory Visit: Payer: Self-pay

## 2017-02-27 DIAGNOSIS — D649 Anemia, unspecified: Secondary | ICD-10-CM

## 2017-02-27 LAB — IRON AND TIBC
%SAT: 32 % (ref 11–50)
Iron: 99 ug/dL (ref 45–160)
TIBC: 307 ug/dL (ref 250–450)
UIBC: 208 ug/dL

## 2017-02-27 NOTE — Progress Notes (Signed)
PT is aware of results and add on labs. I called [facility] and spoke to [place] and added the iron/tibc and ferritin.  Lab order on file for next CBC around 03/29/2017.

## 2017-02-28 LAB — FERRITIN: Ferritin: 142 ng/mL (ref 20–288)

## 2017-03-06 NOTE — Progress Notes (Signed)
Plan on upcoming CBC as scheduled. Her iron level is normal.

## 2017-03-23 LAB — CBC WITH DIFFERENTIAL/PLATELET
Basophils Absolute: 116 cells/uL (ref 0–200)
Basophils Relative: 2 %
Eosinophils Absolute: 870 cells/uL — ABNORMAL HIGH (ref 15–500)
Eosinophils Relative: 15 %
HCT: 31 % — ABNORMAL LOW (ref 35.0–45.0)
Hemoglobin: 10.2 g/dL — ABNORMAL LOW (ref 11.7–15.5)
Lymphocytes Relative: 24 %
Lymphs Abs: 1392 cells/uL (ref 850–3900)
MCH: 31.2 pg (ref 27.0–33.0)
MCHC: 32.9 g/dL (ref 32.0–36.0)
MCV: 94.8 fL (ref 80.0–100.0)
MPV: 10.3 fL (ref 7.5–12.5)
Monocytes Absolute: 638 cells/uL (ref 200–950)
Monocytes Relative: 11 %
Neutro Abs: 2784 cells/uL (ref 1500–7800)
Neutrophils Relative %: 48 %
Platelets: 295 10*3/uL (ref 140–400)
RBC: 3.27 MIL/uL — ABNORMAL LOW (ref 3.80–5.10)
RDW: 13.3 % (ref 11.0–15.0)
WBC: 5.8 10*3/uL (ref 3.8–10.8)

## 2017-03-25 NOTE — Progress Notes (Signed)
PT is aware.

## 2017-03-25 NOTE — Progress Notes (Signed)
Hgb low but stable. Likely anemia of chronic disease. No evidence of IDA. TCS as planned.

## 2017-04-02 NOTE — Patient Instructions (Signed)
Jordan [name]  04/02/2017     @PREFPERIOPPHARMACY @   Your procedure is scheduled on 04/13/2017.  Report to [facility] at 7:00 A.M.  Call this number if you have problems the morning of surgery:  [phone]   Remember:  Do not eat food or drink liquids after midnight.  Take these medicines the morning of surgery with A SIP OF WATER Albuterol inhaler and bring with you, Prozac, Lisinopril, Dulera, Singulair   Do not wear jewelry, make-up or nail polish.  Do not wear lotions, powders, or perfumes, or deoderant.  Do not shave 48 hours prior to surgery.  Men may shave face and neck.  Do not bring valuables to the hospital.  [facility] is not responsible for any belongings or valuables.  Contacts, dentures or bridgework may not be worn into surgery.  Leave your suitcase in the car.  After surgery it may be brought to your room.  For patients admitted to the hospital, discharge time will be determined by your treatment team.  Patients discharged the day of surgery will not be allowed to drive home.    Please read over the following fact sheets that you were given. Anesthesia Post-op Instructions     PATIENT INSTRUCTIONS POST-ANESTHESIA  IMMEDIATELY FOLLOWING SURGERY:  Do not drive or operate machinery for the first twenty four hours after surgery.  Do not make any important decisions for twenty four hours after surgery or while taking narcotic pain medications or sedatives.  If you develop intractable nausea and vomiting or a severe headache please notify your doctor immediately.  FOLLOW-UP:  Please make an appointment with your surgeon as instructed. You do not need to follow up with anesthesia unless specifically instructed to do so.  WOUND CARE INSTRUCTIONS (if applicable):  Keep a dry clean dressing on the anesthesia/puncture wound site if there is drainage.  Once the wound has quit draining you may leave it open to air.  Generally you should leave the bandage intact for  twenty four hours unless there is drainage.  If the epidural site drains for more than 36-48 hours please call the anesthesia department.  QUESTIONS?:  Please feel free to call your physician or the hospital operator if you have any questions, and they will be happy to assist you.      Colonoscopy, Adult A colonoscopy is an exam to look at the entire large intestine. During the exam, a lubricated, bendable tube is inserted into the anus and then passed into the rectum, colon, and other parts of the large intestine. A colonoscopy is often done as a part of normal colorectal screening or in response to certain symptoms, such as anemia, persistent diarrhea, abdominal pain, and blood in the stool. The exam can help screen for and diagnose medical problems, including:  Tumors.  Polyps.  Inflammation.  Areas of bleeding.  Tell a health care provider about:  Any allergies you have.  All medicines you are taking, including vitamins, herbs, eye drops, creams, and over-the-counter medicines.  Any problems you or family members have had with anesthetic medicines.  Any blood disorders you have.  Any surgeries you have had.  Any medical conditions you have.  Any problems you have had passing stool. What are the risks? Generally, this is a safe procedure. However, problems may occur, including:  Bleeding.  A tear in the intestine.  A reaction to medicines given during the exam.  Infection (rare).  What happens before the procedure? Eating and drinking restrictions  Follow instructions from your health care provider about eating and drinking, which may include:  A few days before the procedure - follow a low-fiber diet. Avoid nuts, seeds, dried fruit, raw fruits, and vegetables.  1-3 days before the procedure - follow a clear liquid diet. Drink only clear liquids, such as clear broth or bouillon, black coffee or tea, clear juice, clear soft drinks or sports drinks, gelatin dessert,  and popsicles. Avoid any liquids that contain red or purple dye.  On the day of the procedure - do not eat or drink anything during the 2 hours before the procedure, or within the time period that your health care provider recommends.  Bowel prep If you were prescribed an oral bowel prep to clean out your colon:  Take it as told by your health care provider. Starting the day before your procedure, you will need to drink a large amount of medicated liquid. The liquid will cause you to have multiple loose stools until your stool is almost clear or light green.  If your skin or anus gets irritated from diarrhea, you may use these to relieve the irritation: ? Medicated wipes, such as adult wet wipes with aloe and vitamin E. ? A skin soothing-product like petroleum jelly.  If you vomit while drinking the bowel prep, take a break for up to 60 minutes and then begin the bowel prep again. If vomiting continues and you cannot take the bowel prep without vomiting, call your health care provider.  General instructions  Ask your health care provider about changing or stopping your regular medicines. This is especially important if you are taking diabetes medicines or blood thinners.  Plan to have someone take you home from the hospital or clinic. What happens during the procedure?  An IV tube may be inserted into one of your veins.  You will be given medicine to help you relax (sedative).  To reduce your risk of infection: ? Your health care team will wash or sanitize their hands. ? Your anal area will be washed with soap.  You will be asked to lie on your side with your knees bent.  Your health care provider will lubricate a long, thin, flexible tube. The tube will have a camera and a light on the end.  The tube will be inserted into your anus.  The tube will be gently eased through your rectum and colon.  Air will be delivered into your colon to keep it open. You may feel some pressure or  cramping.  The camera will be used to take images during the procedure.  A small tissue sample may be removed from your body to be examined under a microscope (biopsy). If any potential problems are found, the tissue will be sent to a lab for testing.  If small polyps are found, your health care provider may remove them and have them checked for cancer cells.  The tube that was inserted into your anus will be slowly removed. The procedure may vary among health care providers and hospitals. What happens after the procedure?  Your blood pressure, heart rate, breathing rate, and blood oxygen level will be monitored until the medicines you were given have worn off.  Do not drive for 24 hours after the exam.  You may have a small amount of blood in your stool.  You may pass gas and have mild abdominal cramping or bloating due to the air that was used to inflate your colon during the exam.  It  is up to you to get the results of your procedure. Ask your health care provider, or the department performing the procedure, when your results will be ready. This information is not intended to replace advice given to you by your health care provider. Make sure you discuss any questions you have with your health care provider. Document Released: 08/22/2000 Document Revised: 06/25/2016 Document Reviewed: 11/06/2015 Elsevier Interactive Patient Education  2018 [employer].

## 2017-04-06 ENCOUNTER — Encounter (HOSPITAL_COMMUNITY)
Admission: RE | Admit: 2017-04-06 | Discharge: 2017-04-06 | Disposition: A | Discharge status: Home / Self Care | Payer: Medicare Other | Source: Ambulatory Visit | Attending: Internal Medicine | Admitting: Internal Medicine

## 2017-04-06 ENCOUNTER — Encounter (HOSPITAL_COMMUNITY): Payer: Self-pay

## 2017-04-06 ENCOUNTER — Other Ambulatory Visit: Payer: Self-pay

## 2017-04-06 DIAGNOSIS — Z01812 Encounter for preprocedural laboratory examination: Secondary | ICD-10-CM | POA: Diagnosis not present

## 2017-04-06 DIAGNOSIS — I10 Essential (primary) hypertension: Secondary | ICD-10-CM | POA: Diagnosis not present

## 2017-04-06 DIAGNOSIS — Z01810 Encounter for preprocedural cardiovascular examination: Secondary | ICD-10-CM | POA: Insufficient documentation

## 2017-04-06 HISTORY — DX: Cerebral infarction, unspecified: I63.9

## 2017-04-06 LAB — CBC WITH DIFFERENTIAL/PLATELET
Basophils Absolute: 0.1 10*3/uL (ref 0.0–0.1)
Basophils Relative: 1 %
Eosinophils Absolute: 0.8 10*3/uL — ABNORMAL HIGH (ref 0.0–0.7)
Eosinophils Relative: 11 %
HCT: 30.6 % — ABNORMAL LOW (ref 36.0–46.0)
Hemoglobin: 10.3 g/dL — ABNORMAL LOW (ref 12.0–15.0)
Lymphocytes Relative: 20 %
Lymphs Abs: 1.5 10*3/uL (ref 0.7–4.0)
MCH: 31.3 pg (ref 26.0–34.0)
MCHC: 33.7 g/dL (ref 30.0–36.0)
MCV: 93 fL (ref 78.0–100.0)
Monocytes Absolute: 0.8 10*3/uL (ref 0.1–1.0)
Monocytes Relative: 11 %
Neutro Abs: 4.2 10*3/uL (ref 1.7–7.7)
Neutrophils Relative %: 57 %
Platelets: 245 10*3/uL (ref 150–400)
RBC: 3.29 MIL/uL — ABNORMAL LOW (ref 3.87–5.11)
RDW: 12.8 % (ref 11.5–15.5)
WBC: 7.4 10*3/uL (ref 4.0–10.5)

## 2017-04-06 LAB — BASIC METABOLIC PANEL
Anion gap: 9 (ref 5–15)
BUN: 20 mg/dL (ref 6–20)
CO2: 22 mmol/L (ref 22–32)
Calcium: 9.4 mg/dL (ref 8.9–10.3)
Chloride: 108 mmol/L (ref 101–111)
Creatinine, Ser: 1.58 mg/dL — ABNORMAL HIGH (ref 0.44–1.00)
GFR calc Af Amer: 37 mL/min — ABNORMAL LOW (ref >60)
GFR calc non Af Amer: 32 mL/min — ABNORMAL LOW (ref >60)
Glucose, Bld: 111 mg/dL — ABNORMAL HIGH (ref 65–99)
Potassium: 4.6 mmol/L (ref 3.5–5.1)
Sodium: 139 mmol/L (ref 135–145)

## 2017-04-13 ENCOUNTER — Encounter (HOSPITAL_COMMUNITY): Payer: Self-pay

## 2017-04-13 ENCOUNTER — Other Ambulatory Visit: Payer: Self-pay

## 2017-04-13 ENCOUNTER — Telehealth: Payer: Self-pay

## 2017-04-13 ENCOUNTER — Encounter (HOSPITAL_COMMUNITY)
Admission: RE | Disposition: A | Discharge status: Home / Self Care | Payer: Self-pay | Source: Ambulatory Visit | Attending: Internal Medicine

## 2017-04-13 ENCOUNTER — Ambulatory Visit (HOSPITAL_COMMUNITY)
Admission: RE | Admit: 2017-04-13 | Discharge: 2017-04-13 | Disposition: A | Discharge status: Home / Self Care | Payer: Medicare Other | Source: Ambulatory Visit | Attending: Internal Medicine | Admitting: Internal Medicine

## 2017-04-13 ENCOUNTER — Ambulatory Visit (HOSPITAL_COMMUNITY): Payer: Medicare Other | Admitting: Anesthesiology

## 2017-04-13 DIAGNOSIS — I252 Old myocardial infarction: Secondary | ICD-10-CM | POA: Insufficient documentation

## 2017-04-13 DIAGNOSIS — R634 Abnormal weight loss: Secondary | ICD-10-CM

## 2017-04-13 DIAGNOSIS — K5730 Diverticulosis of large intestine without perforation or abscess without bleeding: Secondary | ICD-10-CM | POA: Insufficient documentation

## 2017-04-13 DIAGNOSIS — K529 Noninfective gastroenteritis and colitis, unspecified: Secondary | ICD-10-CM

## 2017-04-13 DIAGNOSIS — N189 Chronic kidney disease, unspecified: Secondary | ICD-10-CM | POA: Diagnosis not present

## 2017-04-13 DIAGNOSIS — M810 Age-related osteoporosis without current pathological fracture: Secondary | ICD-10-CM | POA: Diagnosis not present

## 2017-04-13 DIAGNOSIS — K589 Irritable bowel syndrome without diarrhea: Secondary | ICD-10-CM | POA: Insufficient documentation

## 2017-04-13 DIAGNOSIS — Z8673 Personal history of transient ischemic attack (TIA), and cerebral infarction without residual deficits: Secondary | ICD-10-CM | POA: Diagnosis not present

## 2017-04-13 DIAGNOSIS — I5181 Takotsubo syndrome: Secondary | ICD-10-CM | POA: Diagnosis not present

## 2017-04-13 DIAGNOSIS — I130 Hypertensive heart and chronic kidney disease with heart failure and stage 1 through stage 4 chronic kidney disease, or unspecified chronic kidney disease: Secondary | ICD-10-CM | POA: Insufficient documentation

## 2017-04-13 DIAGNOSIS — J449 Chronic obstructive pulmonary disease, unspecified: Secondary | ICD-10-CM | POA: Diagnosis not present

## 2017-04-13 DIAGNOSIS — Z9049 Acquired absence of other specified parts of digestive tract: Secondary | ICD-10-CM | POA: Insufficient documentation

## 2017-04-13 DIAGNOSIS — K643 Fourth degree hemorrhoids: Secondary | ICD-10-CM | POA: Insufficient documentation

## 2017-04-13 DIAGNOSIS — Z7952 Long term (current) use of systemic steroids: Secondary | ICD-10-CM | POA: Diagnosis not present

## 2017-04-13 DIAGNOSIS — Z7983 Long term (current) use of bisphosphonates: Secondary | ICD-10-CM | POA: Diagnosis not present

## 2017-04-13 DIAGNOSIS — R198 Other specified symptoms and signs involving the digestive system and abdomen: Secondary | ICD-10-CM

## 2017-04-13 DIAGNOSIS — Z860100 Personal history of colon polyps, unspecified: Secondary | ICD-10-CM

## 2017-04-13 DIAGNOSIS — Z79899 Other long term (current) drug therapy: Secondary | ICD-10-CM | POA: Insufficient documentation

## 2017-04-13 DIAGNOSIS — D649 Anemia, unspecified: Secondary | ICD-10-CM

## 2017-04-13 DIAGNOSIS — I509 Heart failure, unspecified: Secondary | ICD-10-CM | POA: Insufficient documentation

## 2017-04-13 HISTORY — PX: COLONOSCOPY WITH PROPOFOL: SHX5780

## 2017-04-13 HISTORY — PX: BIOPSY: SHX5522

## 2017-04-13 SURGERY — COLONOSCOPY WITH PROPOFOL
Anesthesia: Monitor Anesthesia Care

## 2017-04-13 MED ORDER — LIDOCAINE HCL (CARDIAC) 20 MG/ML IV SOLN
INTRAVENOUS | Status: DC | PRN
  Administered 2017-04-13: 40 mg via INTRAVENOUS

## 2017-04-13 MED ORDER — ONDANSETRON HCL 4 MG/2ML IJ SOLN
INTRAMUSCULAR | Status: AC
  Filled 2017-04-13: qty 2

## 2017-04-13 MED ORDER — FENTANYL CITRATE (PF) 100 MCG/2ML IJ SOLN
INTRAMUSCULAR | Status: AC
  Filled 2017-04-13: qty 2

## 2017-04-13 MED ORDER — PROPOFOL 10 MG/ML IV BOLUS
INTRAVENOUS | Status: DC | PRN
  Administered 2017-04-13 (×2): 20 mg via INTRAVENOUS

## 2017-04-13 MED ORDER — LACTATED RINGERS IV SOLN
INTRAVENOUS | Status: DC
  Administered 2017-04-13: 08:00:00 via INTRAVENOUS

## 2017-04-13 MED ORDER — IPRATROPIUM-ALBUTEROL 0.5-2.5 (3) MG/3ML IN SOLN
3.0000 mL | Freq: Once | RESPIRATORY_TRACT | Status: AC
  Administered 2017-04-13: 3 mL via RESPIRATORY_TRACT
  Filled 2017-04-13: qty 3

## 2017-04-13 MED ORDER — PROPOFOL 500 MG/50ML IV EMUL
INTRAVENOUS | Status: DC | PRN
  Administered 2017-04-13: 200 ug/kg/min via INTRAVENOUS

## 2017-04-13 MED ORDER — MIDAZOLAM HCL 2 MG/2ML IJ SOLN
INTRAMUSCULAR | Status: AC
  Filled 2017-04-13: qty 2

## 2017-04-13 MED ORDER — PROPOFOL 10 MG/ML IV BOLUS
INTRAVENOUS | Status: AC
  Filled 2017-04-13: qty 40

## 2017-04-13 MED ORDER — MIDAZOLAM HCL 2 MG/2ML IJ SOLN
1.0000 mg | INTRAMUSCULAR | Status: AC
  Administered 2017-04-13 (×2): 1 mg via INTRAVENOUS

## 2017-04-13 MED ORDER — FENTANYL CITRATE (PF) 100 MCG/2ML IJ SOLN
25.0000 ug | Freq: Once | INTRAMUSCULAR | Status: AC
  Administered 2017-04-13: 25 ug via INTRAVENOUS

## 2017-04-13 MED ORDER — CHLORHEXIDINE GLUCONATE CLOTH 2 % EX PADS: 6.0000 | MEDICATED_PAD | Freq: Once | CUTANEOUS | Status: DC

## 2017-04-13 MED ORDER — GLYCOPYRROLATE 0.2 MG/ML IJ SOLN
INTRAMUSCULAR | Status: AC
  Filled 2017-04-13: qty 1

## 2017-04-13 MED ORDER — ONDANSETRON HCL 4 MG/2ML IJ SOLN
4.0000 mg | Freq: Once | INTRAMUSCULAR | Status: AC
  Administered 2017-04-13: 4 mg via INTRAVENOUS

## 2017-04-13 MED ORDER — GLYCOPYRROLATE 0.2 MG/ML IJ SOLN
0.2000 mg | Freq: Once | INTRAMUSCULAR | Status: AC | PRN
  Administered 2017-04-13: 0.2 mg via INTRAVENOUS

## 2017-04-13 NOTE — Telephone Encounter (Signed)
Jordan [name] at [facility] called office this morning and said that [facility] wanted pt to have abdominal ultrasound this week. [place] abd complete scheduled for 04/17/17 at 9:30am, arrive at 9:15am. NPO 6 hours prior to test. Tried to call pt, no answer, LMOVM for her to call office to be informed of appt. Letter was mailed.

## 2017-04-13 NOTE — Op Note (Signed)
[facility] Patient Name: Jordan [name] Procedure Date: 04/13/2017 8:48 AM MRN: [id number] Date of Birth: 20-Oct-1945 Attending MD: Norvel Richards , MD CSN: [id number] Age: 71 Admit Type: Outpatient Procedure:                Colonoscopy Indications:              Chronic diarrhea; fullness right upper quadrant. Providers:                Norvel Richards, MD, Lurline Del, RN, Randa Spike, Technician Referring MD:              Medicines:                Propofol per Anesthesia Complications:            No immediate complications. Estimated Blood Loss:     Estimated blood loss was minimal. Procedure:                Pre-Anesthesia Assessment:                           - Prior to the procedure, a History and Physical                            was performed, and patient medications and                            allergies were reviewed. The patient's tolerance of                            previous anesthesia was also reviewed. The risks                            and benefits of the procedure and the sedation                            options and risks were discussed with the patient.                            All questions were answered, and informed consent                            was obtained. Prior Anticoagulants: The patient has                            taken no previous anticoagulant or antiplatelet                            agents. ASA Grade Assessment: III - A patient with                            severe systemic disease. After reviewing the risks  and benefits, the patient was deemed in                            satisfactory condition to undergo the procedure.                           After obtaining informed consent, the colonoscope                            was passed under direct vision. Throughout the                            procedure, the patient's blood pressure, pulse, and          oxygen saturations were monitored continuously. The                            EC-[id number] ([id number]) was introduced through the and                            advanced to the the cecum, identified by                            appendiceal orifice and ileocecal valve. The                            colonoscopy was performed without difficulty. The                            ileocecal valve, appendiceal orifice, and rectum                            were photographed. The quality of the bowel                            preparation was adequate. Scope In: 9:11:27 AM Scope Out: 9:37:04 AM Scope Withdrawal Time: 0 hours 14 minutes 23 seconds  Total Procedure Duration: 0 hours 25 minutes 37 seconds  Findings:      The perianal exam findings include internal hemorrhoids that do not       return to the anal canal, thus continuously prolapsed (Grade IV).      Multiple small and large-mouthed diverticula were found in the entire       colon. This was biopsied with a cold forceps for histology. Segmental       biopsies the right and left colon to evaluate for microscopic colitis. I       could notintubate the terminal ileum because of angulation. Impression:               - Internal hemorrhoids that do not return to the                            anal canal, thus continuously prolapsed (Grade IV)                            found on perianal exam.                           -  Diverticulosis in the entire examined colon.                            Biopsied. Moderate Sedation:      Moderate (conscious) sedation was personally administered by an       anesthesia professional. The following parameters were monitored: oxygen       saturation, heart rate, blood pressure, respiratory rate, EKG, adequacy       of pulmonary ventilation, and response to care. Total physician       intraservice time was 32 minutes. Recommendation:           - Patient has a contact number available for                             emergencies. The signs and symptoms of potential                            delayed complications were discussed with the                            patient. Return to normal activities tomorrow.                            Written discharge instructions were provided to the                            patient.                           - Advance diet as tolerated.                           - Continue present medications.                           - Await pathology results. Abdominal ultrasound.                           - Return to GI office in 1 month. no repeat                            colonoscopy due to age unless new symptoms develop.                           - No repeat colonoscopy due to age. Procedure Code(s):        --- Professional ---                           [id number], Colonoscopy, flexible; with biopsy, single                            or multiple Diagnosis Code(s):        --- Professional ---                           K64.3, Fourth degree hemorrhoids  K52.9, Noninfective gastroenteritis and colitis,                            unspecified                           K57.30, Diverticulosis of large intestine without                            perforation or abscess without bleeding CPT copyright 2016 American Medical Association. All rights reserved. The codes documented in this report are preliminary and upon coder review may  be revised to meet current compliance requirements. Cristopher Estimable. Rourk, MD Norvel Richards, MD 04/13/2017 9:50:50 AM This report has been signed electronically. Number of Addenda: 0

## 2017-04-13 NOTE — Anesthesia Preprocedure Evaluation (Signed)
Anesthesia Evaluation  Patient identified by MRN, date of birth, ID band Patient awake    Reviewed: Allergy & Precautions, NPO status , Patient's Chart, lab work & pertinent test results  Airway Mallampati: II  TM Distance: >3 FB Neck ROM: Full    Dental no notable dental hx. (+) Teeth Intact, Dental Advidsory Given   Pulmonary shortness of breath and with exertion, asthma , COPD,  COPD inhaler,    Pulmonary exam normal breath sounds clear to auscultation       Cardiovascular hypertension, Pt. on medications + Past MI, + Peripheral Vascular Disease and +CHF  Normal cardiovascular exam Rhythm:Regular Rate:Normal  Takotsubo cardiomyopathy   Neuro/Psych PSYCHIATRIC DISORDERS Depression CVA negative neurological ROS  negative psych ROS   GI/Hepatic negative GI ROS, Neg liver ROS, hiatal hernia,   Endo/Other  negative endocrine ROS  Renal/GU Renal InsufficiencyRenal disease  negative genitourinary   Musculoskeletal negative musculoskeletal ROS (+)   Abdominal   Peds negative pediatric ROS (+)  Hematology negative hematology ROS (+) anemia ,   Anesthesia Other Findings   Reproductive/Obstetrics negative OB ROS                             Anesthesia Physical Anesthesia Plan  ASA: III  Anesthesia Plan: MAC   Post-op Pain Management:    Induction: Intravenous  PONV Risk Score and Plan:   Airway Management Planned: Simple Face Mask  Additional Equipment:   Intra-op Plan:   Post-operative Plan:   Informed Consent: I have reviewed the patients History and Physical, chart, labs and discussed the procedure including the risks, benefits and alternatives for the proposed anesthesia with the patient or authorized representative who has indicated his/her understanding and acceptance.     Plan Discussed with:   Anesthesia Plan Comments:         Anesthesia Quick Evaluation

## 2017-04-13 NOTE — Transfer of Care (Signed)
Immediate Anesthesia Transfer of Care Note  Patient: Jordan [name]  Procedure(s) Performed: Procedure(s) with comments: COLONOSCOPY WITH PROPOFOL (N/A) - 8:30am BIOPSY - right and left colon   Patient Location: PACU  Anesthesia Type:MAC  Level of Consciousness: awake, alert  and oriented  Airway & Oxygen Therapy: Patient Spontanous Breathing and Patient connected to nasal cannula oxygen  Post-op Assessment: Report given to RN, Post -op Vital signs reviewed and stable and Patient moving all extremities X 4  Post vital signs: Reviewed and stable  Last Vitals:  Vitals:   04/13/17 0840 04/13/17 0845  BP: 138/83 (!) 152/78  Pulse:    Resp: (!) 34 (!) 23  Temp:      Last Pain:  Vitals:   04/13/17 0731  TempSrc: Oral         Complications: No apparent anesthesia complications

## 2017-04-13 NOTE — Telephone Encounter (Signed)
Pt called office and was informed of [place] abd appt and instructions.

## 2017-04-13 NOTE — H&P (Addendum)
@LOGO @   Primary Care Physician:  Sinda Du, MD Primary Gastroenterologist:  Dr. Gala Romney  Pre-Procedure History & Physical: HPI:  Jordan [name] is a 71 y.o. female here for diagnostic colonoscopy. Change in bowel habits-prominence of diarrhea. History of a tubular adenoma removed 2002. Chronic anemia. Patient is not iron deficient. No melena or hematochezia recently. Celiac panel, GI P and C. difficile negative.  Past Medical History:  Diagnosis Date  . Allergy    seasonal  . Anemia   . Arthritis   . Asthma    uses Advair and Spiriva daily,Albuterol prn.Takes Singulair at bedtime and Prednisone daily  . Back spasm    takes Zanaflex daily as needed  . Blood transfusion 2007   no abnormal reation noted   . Bruises easily    d/t being on Prednisone  . Bursitis    left elbow  . C. difficile colitis    2007: treated with Flagyl and Vanc, 2014: treated with vanc, Aug 2014: Vanc taper  . Chronic back pain   . Chronic kidney disease    "creatine" creeping up - followed at this time by PCP  . Complication of anesthesia 2000   woke up during one surgery- ovary surgery  . COPD (chronic obstructive pulmonary disease) ([place])   . Depression    takes Prozac daily  . H/O hiatal hernia   . Hemorrhoids   . History of bronchitis    last time a yr ago  . History of colon polyps   . History of kidney stones   . History of kidney stones   . Hyperlipidemia    hx of-was on meds but has been off x 1 1/2 yrs  . Hypertension    takes Verapamil nightly  . IBS (irritable bowel syndrome)   . Insomnia    takes Benadryl as needed  . Myocardial infarction [facility]) 2007   Takotsubo cardiomyopathy  . Nocturia   . Osteoporosis    takes Fosamax weekly  . Pneumonia    MRSA pneumonia in 2007  . Shortness of breath    with exertion daily  . Stroke [facility])    "they say I has a small stroke".No deficits  . Tingling    and pain in left leg  . Urinary frequency     Past Surgical History:   Procedure Laterality Date  . APPENDECTOMY    . CARPAL TUNNEL RELEASE Left   . CARPAL TUNNEL RELEASE Right 09/21/2015   Procedure: CARPAL TUNNEL RELEASE;  Surgeon: Carole Civil, MD;  Location: [facility];  Service: Orthopedics;  Laterality: Right;  . cataract surgery Bilateral   . CHOLECYSTECTOMY    . COLONOSCOPY   09/24/2006   [facility]: Normal rectum, left-sided diverticula  . COLONOSCOPY  Oct 2012   Dr. Henrene Pastor: tubular adenomas, moderative diverticulosis, internal hemorrhoids  . CYSTOSCOPY    . ENDARTERECTOMY Right 12/12/2015   Procedure: ENDARTERECTOMY RIGHT CAROTID, RESECTION OF REDUNDANT RIGHT COMMON CAROTID ARTERY WITH PRIMARY REANASTOMOSIS;  Surgeon: Mal Misty, MD;  Location: [place];  Service: Vascular;  Laterality: Right;  . ESOPHAGOGASTRODUODENOSCOPY  04/07/2006   IDP:OEUMPN esophagus s/p placement of Bravo pH probe, some surgical changes but not typical of fundoplication wrap, may have slipped  . GANGLION CYST EXCISION Right 09/21/2015   Procedure: RIGHT WRIST GANGLION CYST REMOVAL;  Surgeon: Carole Civil, MD;  Location: [facility];  Service: Orthopedics;  Laterality: Right;  . hemorrhoidal banding    . HERNIA REPAIR     umbilical  .  KNEE ARTHROSCOPY Right   . LUMBAR LAMINECTOMY/DECOMPRESSION MICRODISCECTOMY Left 10/17/2013   Procedure: LUMBAR LAMINECTOMY/DECOMPRESSION MICRODISCECTOMY 1 LEVEL three/four;  Surgeon: Ophelia Charter, MD;  Location: [place] [facility];  Service: Neurosurgery;  Laterality: Left;  . NECK SURGERY     fusion-   . NISSEN FUNDOPLICATION     x 2  . OLECRANON BURSECTOMY Left 01/11/2014   Procedure: OLECRANON BURSECTOMY;  Surgeon: Carole Civil, MD;  Location: [facility];  Service: Orthopedics;  Laterality: Left;  . OOPHORECTOMY Right   . PATCH ANGIOPLASTY Right 12/12/2015   Procedure: PATCH ANGIOPLASTY RIGHT CAROTID ARTERY USING HEMASHIELD PLATINUM FINESSE PATCH;  Surgeon: Mal Misty, MD;  Location: [place];  Service: Vascular;  Laterality: Right;  .  TONSILLECTOMY      Prior to Admission medications   Medication Sig Start Date End Date Taking? Authorizing Provider  albuterol (PROVENTIL HFA;VENTOLIN HFA) 108 (90 BASE) MCG/ACT inhaler Inhale 2 puffs into the lungs every 6 (six) hours as needed for wheezing.   Yes [provider]  alendronate (FOSAMAX) 70 MG tablet Take 70 mg by mouth every Thursday. Take with a full glass of water on an empty stomach.   Yes [provider]  Cholecalciferol (VITAMIN D-3) 5000 UNITS TABS Take 5,000 Units by mouth daily.   Yes [provider]  FLUoxetine (PROZAC) 40 MG capsule Take 40 mg by mouth daily.     Yes [provider]  ibuprofen (ADVIL,MOTRIN) 800 MG tablet Take 800 mg by mouth 2 (two) times daily as needed for moderate pain.    Yes [provider]  lisinopril (PRINIVIL,ZESTRIL) 20 MG tablet Take 20 mg by mouth daily.   Yes [provider]  mometasone-formoterol (DULERA) 200-5 MCG/ACT AERO Inhale 1 puff into the lungs 2 (two) times daily.   Yes [provider]  montelukast (SINGULAIR) 10 MG tablet Take 10 mg by mouth daily.    Yes [provider]  polyethylene glycol-electrolytes (TRILYTE) 420 g solution Take 4,000 mLs by mouth as directed. 02/20/17  Yes Rourk, Cristopher Estimable, MD  predniSONE (DELTASONE) 5 MG tablet Take 5 mg by mouth daily.  09/20/15  Yes [provider]  TURMERIC CURCUMIN PO Take 1,500 mg by mouth daily.   Yes [provider]    Allergies as of 02/23/2017 - Review Complete 02/20/2017  Allergen Reaction Noted  . Cardura [doxazosin mesylate] Hives 09/14/2013  . Strawberry leaves extract Hives 12/11/2015  . Barbiturates Nausea And Vomiting and Rash 05/04/2009  . Levofloxacin Rash 12/06/2015  . Sulfa antibiotics Nausea Only 07/07/2011    Family History  Problem Relation Age of Onset  . Heart disease Father   . Colon cancer Neg Hx     Social History   Social History  . Marital status: Divorced     Spouse name: N/A  . Number of children: N/A  . Years of education: N/A   Occupational History  . Not on file.   Social History Main Topics  . Smoking status: Never Smoker  . Smokeless tobacco: Never Used  . Alcohol use 6.0 oz/week    10 Cans of beer per week     Comment: couple of beers daily  . Drug use: No  . Sexual activity: No   Other Topics Concern  . Not on file   Social History Narrative  . No narrative on file    Review of Systems: See HPI, otherwise negative ROS  Physical Exam: There were no vitals taken for this visit.  General:   Alert,  Well-developed, well-nourished, pleasant and cooperative in NAD Mouth:  No deformity or lesions. Neck:  Supple; no masses or thyromegaly. No significant cervical adenopathy. Lungs:  Clear throughout to auscultation.   No wheezes, crackles, or rhonchi. No acute distress. Heart:  Regular rate and rhythm; no murmurs, clicks, rubs,  or gallops. Abdomen: Non-distended, normal bowel sounds.  There is a vague fullness right upper quadrant as detected in the office of uncertain significance. The abdomen is entirely nontender. Pulses:  Normal pulses noted. Extremities:  Without clubbing or edema.  Impression / recommendations:  Pleasant 71 year old lady which Asian bowel habits history tubular adenoma. Here for diagnostic colonoscopy.The risks, benefits, limitations, alternatives and imponderables have been reviewed with the patient. Questions have been answered. All parties are agreeable.   Will pursue right upper quadrant ultrasound in the near future as well.        Notice: This dictation was prepared with Dragon dictation along with smaller phrase technology. Any transcriptional errors that result from this process are unintentional and may not be corrected upon review.

## 2017-04-13 NOTE — Discharge Instructions (Addendum)
Colonoscopy Discharge Instructions  Read the instructions outlined below and refer to this sheet in the next few weeks. These discharge instructions provide you with general information on caring for yourself after you leave the hospital. Your doctor may also give you specific instructions. While your treatment has been planned according to the most current medical practices available, unavoidable complications occasionally occur. If you have any problems or questions after discharge, call Dr. Gala Romney at [phone]. ACTIVITY  You may resume your regular activity, but move at a slower pace for the next 24 hours.   Take frequent rest periods for the next 24 hours.   Walking will help get rid of the air and reduce the bloated feeling in your belly (abdomen).   No driving for 24 hours (because of the medicine (anesthesia) used during the test).    Do not sign any important legal documents or operate any machinery for 24 hours (because of the anesthesia used during the test).  NUTRITION  Drink plenty of fluids.   You may resume your normal diet as instructed by your doctor.   Begin with a light meal and progress to your normal diet. Heavy or fried foods are harder to digest and may make you feel sick to your stomach (nauseated).   Avoid alcoholic beverages for 24 hours or as instructed.  MEDICATIONS  You may resume your normal medications unless your doctor tells you otherwise.  WHAT YOU CAN EXPECT TODAY  Some feelings of bloating in the abdomen.   Passage of more gas than usual.   Spotting of blood in your stool or on the toilet paper.  IF YOU HAD POLYPS REMOVED DURING THE COLONOSCOPY:  No aspirin products for 7 days or as instructed.   No alcohol for 7 days or as instructed.   Eat a soft diet for the next 24 hours.  FINDING OUT THE RESULTS OF YOUR TEST Not all test results are available during your visit. If your test results are not back during the visit, make an appointment  with your caregiver to find out the results. Do not assume everything is normal if you have not heard from your caregiver or the medical facility. It is important for you to follow up on all of your test results.  SEEK IMMEDIATE MEDICAL ATTENTION IF:  You have more than a spotting of blood in your stool.   Your belly is swollen (abdominal distention).   You are nauseated or vomiting.   You have a temperature over 101.   You have abdominal pain or discomfort that is severe or gets worse throughout the day.    Colon diverticulosis information provided  Abdominal ultrasound later this week  Further recommendations to follow pending review of pathology report  Diverticulosis Diverticulosis is a condition that develops when small pouches (diverticula) form in the wall of the large intestine (colon). The colon is where water is absorbed and stool is formed. The pouches form when the inside layer of the colon pushes through weak spots in the outer layers of the colon. You may have a few pouches or many of them. What are the causes? The cause of this condition is not known. What increases the risk? The following factors may make you more likely to develop this condition:  Being older than age 80. Your risk for this condition increases with age. Diverticulosis is rare among people younger than age 101. By age 46, many people have it.  Eating a low-fiber diet.  Having frequent constipation.  Being overweight.  Not getting enough exercise.  Smoking.  Taking over-the-counter pain medicines, like aspirin and ibuprofen.  Having a family history of diverticulosis.  What are the signs or symptoms? In most people, there are no symptoms of this condition. If you do have symptoms, they may include:  Bloating.  Cramps in the abdomen.  Constipation or diarrhea.  Pain in the lower left side of the abdomen.  How is this diagnosed? This condition is most often diagnosed during an exam  for other colon problems. Because diverticulosis usually has no symptoms, it often cannot be diagnosed independently. This condition may be diagnosed by:  Using a flexible scope to examine the colon (colonoscopy).  Taking an X-ray of the colon after dye has been put into the colon (barium enema).  Doing a CT scan.  How is this treated? You may not need treatment for this condition if you have never developed an infection related to diverticulosis. If you have had an infection before, treatment may include:  Eating a high-fiber diet. This may include eating more fruits, vegetables, and grains.  Taking a fiber supplement.  Taking a live bacteria supplement (probiotic).  Taking medicine to relax your colon.  Taking antibiotic medicines.  Follow these instructions at home:  Drink 6-8 glasses of water or more each day to prevent constipation.  Try not to strain when you have a bowel movement.  If you have had an infection before: ? Eat more fiber as directed by your health care provider or your diet and nutrition specialist (dietitian). ? Take a fiber supplement or probiotic, if your health care provider approves.  Take over-the-counter and prescription medicines only as told by your health care provider.  If you were prescribed an antibiotic, take it as told by your health care provider. Do not stop taking the antibiotic even if you start to feel better.  Keep all follow-up visits as told by your health care provider. This is important. Contact a health care provider if:  You have pain in your abdomen.  You have bloating.  You have cramps.  You have not had a bowel movement in 3 days. Get help right away if:  Your pain gets worse.  Your bloating becomes very bad.  You have a fever or chills, and your symptoms suddenly get worse.  You vomit.  You have bowel movements that are bloody or black.  You have bleeding from your rectum. Summary  Diverticulosis is a  condition that develops when small pouches (diverticula) form in the wall of the large intestine (colon).  You may have a few pouches or many of them.  This condition is most often diagnosed during an exam for other colon problems.  If you have had an infection related to diverticulosis, treatment may include increasing the fiber in your diet, taking supplements, or taking medicines. This information is not intended to replace advice given to you by your health care provider. Make sure you discuss any questions you have with your health care provider. Document Released: 05/22/2004 Document Revised: 07/14/2016 Document Reviewed: 07/14/2016 Elsevier Interactive Patient Education  2017 [place].  High-Fiber Diet Fiber, also called dietary fiber, is a type of carbohydrate found in fruits, vegetables, whole grains, and beans. A high-fiber diet can have many health benefits. Your health care provider may recommend a high-fiber diet to help:  Prevent constipation. Fiber can make your bowel movements more regular.  Lower your cholesterol.  Relieve hemorrhoids, uncomplicated diverticulosis, or irritable bowel syndrome.  Prevent overeating as part of a weight-loss plan.  Prevent heart disease, type 2 diabetes, and certain cancers.  What is my plan? The recommended daily intake of fiber includes:  38 grams for men under age 41.  46 grams for men over age 32.  41 grams for women under age 53.  35 grams for women over age 25.  You can get the recommended daily intake of dietary fiber by eating a variety of fruits, vegetables, grains, and beans. Your health care provider may also recommend a fiber supplement if it is not possible to get enough fiber through your diet. What do I need to know about a high-fiber diet?  Fiber supplements have not been widely studied for their effectiveness, so it is better to get fiber through food sources.  Always check the fiber content on thenutrition  facts label of any prepackaged food. Look for foods that contain at least 5 grams of fiber per serving.  Ask your dietitian if you have questions about specific foods that are related to your condition, especially if those foods are not listed in the following section.  Increase your daily fiber consumption gradually. Increasing your intake of dietary fiber too quickly may cause bloating, cramping, or gas.  Drink plenty of water. Water helps you to digest fiber. What foods can I eat? Grains Whole-grain breads. Multigrain cereal. Oats and oatmeal. Brown rice. Barley. Bulgur wheat. [place]. Bran muffins. Popcorn. Rye wafer crackers. Vegetables Sweet potatoes. Spinach. Kale. Artichokes. Cabbage. Broccoli. Green peas. Carrots. Squash. Fruits Berries. Pears. Apples. Oranges. Avocados. Prunes and raisins. Dried figs. Meats and Other Protein Sources [place], kidney, pinto, and soy beans. Split peas. Lentils. Nuts and seeds. Dairy Fiber-fortified yogurt. Beverages Fiber-fortified soy milk. Fiber-fortified orange juice. Other Fiber bars. The items listed above may not be a complete list of recommended foods or beverages. Contact your dietitian for more options. What foods are not recommended? Grains White bread. Pasta made with refined flour. White rice. Vegetables Fried potatoes. Canned vegetables. Well-cooked vegetables. Fruits Fruit juice. Cooked, strained fruit. Meats and Other Protein Sources Fatty cuts of meat. Fried Sales executive or fried fish. Dairy Milk. Yogurt. Cream cheese. Sour cream. Beverages Soft drinks. Other Cakes and pastries. Butter and oils. The items listed above may not be a complete list of foods and beverages to avoid. Contact your dietitian for more information. What are some tips for including high-fiber foods in my diet?  Eat a wide variety of high-fiber foods.  Make sure that half of all grains consumed each day are whole grains.  Replace breads and cereals made  from refined flour or white flour with whole-grain breads and cereals.  Replace white rice with brown rice, bulgur wheat, or millet.  Start the day with a breakfast that is high in fiber, such as a cereal that contains at least 5 grams of fiber per serving.  Use beans in place of meat in soups, salads, or pasta.  Eat high-fiber snacks, such as berries, raw vegetables, nuts, or popcorn. This information is not intended to replace advice given to you by your health care provider. Make sure you discuss any questions you have with your health care provider. Document Released: 08/25/2005 Document Revised: 01/31/2016 Document Reviewed: 02/07/2014 Elsevier Interactive Patient Education  2017 [employer].

## 2017-04-13 NOTE — Anesthesia Postprocedure Evaluation (Signed)
Anesthesia Post Note  Patient: Jordan [name]  Procedure(s) Performed: Procedure(s) (LRB): COLONOSCOPY WITH PROPOFOL (N/A) BIOPSY  Patient location during evaluation: Short Stay Anesthesia Type: MAC Level of consciousness: oriented and awake and alert Pain management: pain level controlled Vital Signs Assessment: post-procedure vital signs reviewed and stable Respiratory status: spontaneous breathing Cardiovascular status: blood pressure returned to baseline Postop Assessment: no signs of nausea or vomiting Anesthetic complications: no     Last Vitals:  Vitals:   04/13/17 1010 04/13/17 1015  BP:  (!) 168/82  Pulse: 89 92  Resp: (!) 27 (!) 22  Temp:      Last Pain:  Vitals:   04/13/17 0731  TempSrc: Oral                 Latrell Potempa

## 2017-04-15 ENCOUNTER — Encounter (HOSPITAL_COMMUNITY): Payer: Self-pay | Admitting: Internal Medicine

## 2017-04-16 ENCOUNTER — Encounter: Payer: Self-pay | Admitting: Internal Medicine

## 2017-04-16 ENCOUNTER — Telehealth: Payer: Self-pay

## 2017-04-16 NOTE — Telephone Encounter (Signed)
OV made and appt card mailed °

## 2017-04-16 NOTE — Telephone Encounter (Signed)
Letter mailed to the pt. 

## 2017-04-16 NOTE — Telephone Encounter (Signed)
Per RMR-  Rourk, Cristopher Estimable, MD  Claudina Lick, LPN; Theadora Rama        Send letter to patient.  Send copy of letter with path to referring provider and PCP.   Needs office visit in about 4 weeks if not already scheduled with extender reference diarrhea.

## 2017-04-17 ENCOUNTER — Ambulatory Visit (HOSPITAL_COMMUNITY)
Admission: RE | Admit: 2017-04-17 | Discharge: 2017-04-17 | Disposition: A | Discharge status: Home / Self Care | Payer: Medicare Other | Source: Ambulatory Visit | Attending: Internal Medicine | Admitting: Internal Medicine

## 2017-04-17 DIAGNOSIS — R198 Other specified symptoms and signs involving the digestive system and abdomen: Secondary | ICD-10-CM | POA: Diagnosis present

## 2017-04-17 DIAGNOSIS — K529 Noninfective gastroenteritis and colitis, unspecified: Secondary | ICD-10-CM

## 2017-04-17 DIAGNOSIS — I700 Atherosclerosis of aorta: Secondary | ICD-10-CM | POA: Diagnosis not present

## 2017-05-05 ENCOUNTER — Emergency Department (HOSPITAL_COMMUNITY): Payer: Medicare Other

## 2017-05-05 ENCOUNTER — Encounter (HOSPITAL_COMMUNITY): Payer: Self-pay | Admitting: *Deleted

## 2017-05-05 ENCOUNTER — Emergency Department (HOSPITAL_COMMUNITY)
Admission: EM | Admit: 2017-05-05 | Discharge: 2017-05-05 | Disposition: A | Discharge status: Home / Self Care | Payer: Medicare Other | Attending: Emergency Medicine | Admitting: Emergency Medicine

## 2017-05-05 DIAGNOSIS — J449 Chronic obstructive pulmonary disease, unspecified: Secondary | ICD-10-CM | POA: Diagnosis not present

## 2017-05-05 DIAGNOSIS — S064X0A Epidural hemorrhage without loss of consciousness, initial encounter: Secondary | ICD-10-CM | POA: Insufficient documentation

## 2017-05-05 DIAGNOSIS — W1830XA Fall on same level, unspecified, initial encounter: Secondary | ICD-10-CM | POA: Diagnosis not present

## 2017-05-05 DIAGNOSIS — S064XAA Epidural hemorrhage with loss of consciousness status unknown, initial encounter: Secondary | ICD-10-CM

## 2017-05-05 DIAGNOSIS — R51 Headache: Secondary | ICD-10-CM | POA: Diagnosis not present

## 2017-05-05 DIAGNOSIS — N189 Chronic kidney disease, unspecified: Secondary | ICD-10-CM | POA: Insufficient documentation

## 2017-05-05 DIAGNOSIS — I129 Hypertensive chronic kidney disease with stage 1 through stage 4 chronic kidney disease, or unspecified chronic kidney disease: Secondary | ICD-10-CM | POA: Diagnosis not present

## 2017-05-05 DIAGNOSIS — S1980XA Other specified injuries of unspecified part of neck, initial encounter: Secondary | ICD-10-CM | POA: Diagnosis present

## 2017-05-05 DIAGNOSIS — S12190A Other displaced fracture of second cervical vertebra, initial encounter for closed fracture: Secondary | ICD-10-CM | POA: Diagnosis not present

## 2017-05-05 DIAGNOSIS — Y939 Activity, unspecified: Secondary | ICD-10-CM | POA: Insufficient documentation

## 2017-05-05 DIAGNOSIS — S12091A Other nondisplaced fracture of first cervical vertebra, initial encounter for closed fracture: Secondary | ICD-10-CM | POA: Diagnosis not present

## 2017-05-05 DIAGNOSIS — G8191 Hemiplegia, unspecified affecting right dominant side: Secondary | ICD-10-CM | POA: Diagnosis not present

## 2017-05-05 DIAGNOSIS — W19XXXA Unspecified fall, initial encounter: Secondary | ICD-10-CM | POA: Insufficient documentation

## 2017-05-05 DIAGNOSIS — Y929 Unspecified place or not applicable: Secondary | ICD-10-CM | POA: Diagnosis not present

## 2017-05-05 DIAGNOSIS — S7001XA Contusion of right hip, initial encounter: Secondary | ICD-10-CM | POA: Diagnosis not present

## 2017-05-05 DIAGNOSIS — G839 Paralytic syndrome, unspecified: Secondary | ICD-10-CM

## 2017-05-05 DIAGNOSIS — S064X9A Epidural hemorrhage with loss of consciousness of unspecified duration, initial encounter: Secondary | ICD-10-CM

## 2017-05-05 DIAGNOSIS — S12000A Unspecified displaced fracture of first cervical vertebra, initial encounter for closed fracture: Secondary | ICD-10-CM | POA: Insufficient documentation

## 2017-05-05 DIAGNOSIS — Y999 Unspecified external cause status: Secondary | ICD-10-CM | POA: Diagnosis not present

## 2017-05-05 DIAGNOSIS — Z79899 Other long term (current) drug therapy: Secondary | ICD-10-CM | POA: Insufficient documentation

## 2017-05-05 LAB — URINALYSIS, ROUTINE W REFLEX MICROSCOPIC
Bilirubin Urine: NEGATIVE
Glucose, UA: NEGATIVE mg/dL
Ketones, ur: NEGATIVE mg/dL
Leukocytes, UA: NEGATIVE
Nitrite: NEGATIVE
Protein, ur: NEGATIVE mg/dL
Specific Gravity, Urine: 1.01 (ref 1.005–1.030)
pH: 6 (ref 5.0–8.0)

## 2017-05-05 LAB — COMPREHENSIVE METABOLIC PANEL
ALT: 19 U/L (ref 14–54)
AST: 30 U/L (ref 15–41)
Albumin: 3.5 g/dL (ref 3.5–5.0)
Alkaline Phosphatase: 55 U/L (ref 38–126)
Anion gap: 9 (ref 5–15)
BUN: 22 mg/dL — ABNORMAL HIGH (ref 6–20)
CO2: 19 mmol/L — ABNORMAL LOW (ref 22–32)
Calcium: 8.5 mg/dL — ABNORMAL LOW (ref 8.9–10.3)
Chloride: 102 mmol/L (ref 101–111)
Creatinine, Ser: 1.45 mg/dL — ABNORMAL HIGH (ref 0.44–1.00)
GFR calc Af Amer: 41 mL/min — ABNORMAL LOW (ref >60)
GFR calc non Af Amer: 35 mL/min — ABNORMAL LOW (ref >60)
Glucose, Bld: 113 mg/dL — ABNORMAL HIGH (ref 65–99)
Potassium: 4.6 mmol/L (ref 3.5–5.1)
Sodium: 130 mmol/L — ABNORMAL LOW (ref 135–145)
Total Bilirubin: 0.4 mg/dL (ref 0.3–1.2)
Total Protein: 6.5 g/dL (ref 6.5–8.1)

## 2017-05-05 LAB — PROTIME-INR
INR: 0.99
Prothrombin Time: 13.1 seconds (ref 11.4–15.2)

## 2017-05-05 LAB — I-STAT CHEM 8, ED
BUN: 21 mg/dL — ABNORMAL HIGH (ref 6–20)
Calcium, Ion: 1.17 mmol/L (ref 1.15–1.40)
Chloride: 100 mmol/L — ABNORMAL LOW (ref 101–111)
Creatinine, Ser: 1.4 mg/dL — ABNORMAL HIGH (ref 0.44–1.00)
Glucose, Bld: 108 mg/dL — ABNORMAL HIGH (ref 65–99)
HCT: 28 % — ABNORMAL LOW (ref 36.0–46.0)
Hemoglobin: 9.5 g/dL — ABNORMAL LOW (ref 12.0–15.0)
Potassium: 4.6 mmol/L (ref 3.5–5.1)
Sodium: 132 mmol/L — ABNORMAL LOW (ref 135–145)
TCO2: 19 mmol/L — ABNORMAL LOW (ref 22–32)

## 2017-05-05 LAB — CBC
HCT: 27.1 % — ABNORMAL LOW (ref 36.0–46.0)
Hemoglobin: 9.3 g/dL — ABNORMAL LOW (ref 12.0–15.0)
MCH: 31.7 pg (ref 26.0–34.0)
MCHC: 34.3 g/dL (ref 30.0–36.0)
MCV: 92.5 fL (ref 78.0–100.0)
Platelets: 190 10*3/uL (ref 150–400)
RBC: 2.93 MIL/uL — ABNORMAL LOW (ref 3.87–5.11)
RDW: 13 % (ref 11.5–15.5)
WBC: 14.9 10*3/uL — ABNORMAL HIGH (ref 4.0–10.5)

## 2017-05-05 LAB — SAMPLE TO BLOOD BANK

## 2017-05-05 LAB — URINALYSIS, MICROSCOPIC (REFLEX)
Bacteria, UA: NONE SEEN
Squamous Epithelial / LPF: NONE SEEN

## 2017-05-05 LAB — CK TOTAL AND CKMB (NOT AT ARMC)
CK, MB: 7 ng/mL — ABNORMAL HIGH (ref 0.5–5.0)
Relative Index: 3 — ABNORMAL HIGH (ref 0.0–2.5)
Total CK: 236 U/L — ABNORMAL HIGH (ref 38–234)

## 2017-05-05 LAB — ETHANOL: Alcohol, Ethyl (B): 43 mg/dL — ABNORMAL HIGH (ref <5)

## 2017-05-05 LAB — CDS SEROLOGY

## 2017-05-05 LAB — I-STAT CG4 LACTIC ACID, ED: Lactic Acid, Venous: 2.84 mmol/L (ref 0.5–1.9)

## 2017-05-05 MED ORDER — IOPAMIDOL (ISOVUE-370) INJECTION 76%
50.0000 mL | Freq: Once | INTRAVENOUS | Status: AC | PRN
  Administered 2017-05-05: 50 mL via INTRAVENOUS

## 2017-05-05 MED ORDER — TETANUS-DIPHTH-ACELL PERTUSSIS 5-2.5-18.5 LF-MCG/0.5 IM SUSP
0.5000 mL | Freq: Once | INTRAMUSCULAR | Status: AC
  Administered 2017-05-05: 0.5 mL via INTRAMUSCULAR
  Filled 2017-05-05: qty 0.5

## 2017-05-05 MED ORDER — ONDANSETRON HCL 4 MG/2ML IJ SOLN
4.0000 mg | Freq: Once | INTRAMUSCULAR | Status: AC
  Administered 2017-05-05: 4 mg via INTRAVENOUS
  Filled 2017-05-05: qty 2

## 2017-05-05 MED ORDER — LORAZEPAM 2 MG/ML IJ SOLN
0.5000 mg | Freq: Once | INTRAMUSCULAR | Status: AC
  Administered 2017-05-05: 0.5 mg via INTRAVENOUS
  Filled 2017-05-05: qty 1

## 2017-05-05 MED ORDER — HYDROMORPHONE HCL 1 MG/ML IJ SOLN
1.0000 mg | Freq: Once | INTRAMUSCULAR | Status: AC
  Administered 2017-05-05: 1 mg via INTRAVENOUS
  Filled 2017-05-05: qty 1

## 2017-05-05 MED ORDER — MORPHINE SULFATE (PF) 4 MG/ML IV SOLN
4.0000 mg | Freq: Once | INTRAVENOUS | Status: AC
  Administered 2017-05-05: 4 mg via INTRAVENOUS
  Filled 2017-05-05: qty 1

## 2017-05-05 MED ORDER — IOPAMIDOL (ISOVUE-300) INJECTION 61%
75.0000 mL | Freq: Once | INTRAVENOUS | Status: AC | PRN
  Administered 2017-05-05: 75 mL via INTRAVENOUS

## 2017-05-05 MED ORDER — HYDROMORPHONE HCL 1 MG/ML IJ SOLN
0.5000 mg | Freq: Once | INTRAMUSCULAR | Status: AC
  Administered 2017-05-05: 0.5 mg via INTRAVENOUS
  Filled 2017-05-05: qty 1

## 2017-05-05 MED ORDER — ONDANSETRON HCL 4 MG/2ML IJ SOLN: 4.0000 mg | Freq: Once | INTRAMUSCULAR | Status: DC

## 2017-05-05 MED ORDER — SODIUM CHLORIDE 0.9 % IV BOLUS (SEPSIS)
1000.0000 mL | Freq: Once | INTRAVENOUS | Status: AC
  Administered 2017-05-05: 1000 mL via INTRAVENOUS

## 2017-05-05 NOTE — ED Notes (Signed)
Pt is c/o severe h/a, states "it feels like it's going to explode."  Vanita Panda EDP made aware

## 2017-05-05 NOTE — ED Notes (Signed)
Pt returned from xray/CT with nurse on monitor

## 2017-05-05 NOTE — ED Notes (Signed)
Pt belongings including shirt, shorts, shoes and purse transported with patient. Pt cash including 5$ bill and several one $ bills and credit cards were placed in purse as well as white shiny stone stud earrings and gold colored rope necklace with medic alert charm placed in purse (inside zip pocket)- pt is aware of where items are being placed and verbalized agreement.

## 2017-05-05 NOTE — ED Notes (Signed)
[facility] contacted for transport.  Report called to [facility] ER spoke to [place], [place]

## 2017-05-05 NOTE — ED Notes (Signed)
Pt remains in c-collar with spinal precautions in place.

## 2017-05-05 NOTE — ED Notes (Signed)
Dr Dina Rich called to room to assess pt for concern of no feeling to extremities and concern for neck fx.

## 2017-05-05 NOTE — ED Notes (Signed)
Pt transported to CT/xray on monitor with nurse

## 2017-05-05 NOTE — ED Notes (Signed)
This nurse spoke with Abner Greenspan (pt brother) per patient's verbal permission: Edd Arbour was informed that pt had fall and has suffered fracture to neck and has been transferred to [facility] ED for further evaluation and treatment. Informed that pt was stable at time of departure from [facility]- ED. Ronnie expressed appreciation for the call and said he "is going to head over that way now."

## 2017-05-05 NOTE — ED Triage Notes (Addendum)
Pt transferred from [facility] for further evaluation. Pt has C1 and C2 fracture, C2 displaced, epidural hematoma and left 6th rib fx. Pt A&O x4. Pt has bruising to face and abrasions on rt foot and knee. Pt has inconsistent neuro exam. To this RN, Pt reports weakness to rt extremities and numbness to rt leg. Pt c/o 10/10 head pain.

## 2017-05-05 NOTE — ED Provider Notes (Addendum)
[facility] DEPT Provider Note   CSN: [id number] Arrival date & time: 05/05/17  1884     History   Chief Complaint Chief Complaint  Patient presents with  . Fall    HPI Jordan [name] is a 71 y.o. female.  HPI  This is a 71 year old female who presents following a fall. Per EMS report, patient fell in her driveway approximately 4 hours ago and was unable to get up. In neighbor found her and turned her over. Patient does not remember events leading up to the fall. She does report drinking at least 3 beers tonight and multiple shots of liquor.  She is mostly complaining of head and neck pain. Per EMS, she was moving all 4 extremities; however, I was called to the bedside as the patient seemed to have paralysis on the right side.  Patient denies any use of blood thinners. Denies any chest pain or shortness of breath.  Level V caveat.  Past Medical History:  Diagnosis Date  . Allergy    seasonal  . Anemia   . Arthritis   . Asthma    uses Advair and Spiriva daily,Albuterol prn.Takes Singulair at bedtime and Prednisone daily  . Back spasm    takes Zanaflex daily as needed  . Blood transfusion 2007   no abnormal reation noted   . Bruises easily    d/t being on Prednisone  . Bursitis    left elbow  . C. difficile colitis    2007: treated with Flagyl and Vanc, 2014: treated with vanc, Aug 2014: Vanc taper  . Chronic back pain   . Chronic kidney disease    "creatine" creeping up - followed at this time by PCP  . Complication of anesthesia 2000   woke up during one surgery- ovary surgery  . COPD (chronic obstructive pulmonary disease) ([place])   . Depression    takes Prozac daily  . H/O hiatal hernia   . Hemorrhoids   . History of bronchitis    last time a yr ago  . History of colon polyps   . History of kidney stones   . History of kidney stones   . Hyperlipidemia    hx of-was on meds but has been off x 1 1/2 yrs  . Hypertension    takes Verapamil nightly  . IBS  (irritable bowel syndrome)   . Insomnia    takes Benadryl as needed  . 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 Nocturia   . Osteoporosis    takes Fosamax weekly  . Pneumonia    MRSA pneumonia in 2007  . Shortness of breath    with exertion daily  . Stroke [facility])    "they say I has a small stroke".No deficits  . Tingling    and pain in left leg  . Urinary frequency     Patient Active Problem List   Diagnosis Date Noted  . Anemia 02/20/2017  . History of colon polyps 02/20/2017  . Chronic diarrhea 02/20/2017  . Abnormal weight loss 02/20/2017  . Carotid stenosis, symptomatic w/o infarct 01/01/2016  . Carotid stenosis 11/27/2015  . Ganglion cyst of wrist   . Hematoma 01/23/2014  . Lumbar foraminal stenosis 10/17/2013  . Olecranon bursitis of left elbow 09/14/2013  . Left elbow pain 09/14/2013  . C. difficile diarrhea 06/20/2013  . Loose stools 03/23/2013  . 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 PALPITATIONS 05/04/2009  . CHEST PAIN UNSPECIFIED 05/04/2009  . HYPERTENSION 04/24/2009  .  ASTHMA 04/24/2009  . COPD 04/24/2009    Past Surgical History:  Procedure Laterality Date  . APPENDECTOMY    . BIOPSY  04/13/2017   Procedure: BIOPSY;  Surgeon: Daneil Dolin, MD;  Location: [facility];  Service: Endoscopy;;  right and left colon   . CARPAL TUNNEL RELEASE Left   . CARPAL TUNNEL RELEASE Right 09/21/2015   Procedure: CARPAL TUNNEL RELEASE;  Surgeon: Carole Civil, MD;  Location: [facility] ORS;  Service: Orthopedics;  Laterality: Right;  . cataract surgery Bilateral   . CHOLECYSTECTOMY    . COLONOSCOPY   09/24/2006   RMR: Normal rectum, left-sided diverticula  . COLONOSCOPY  Oct 2012   Dr. Henrene Pastor: tubular adenomas, moderative diverticulosis, internal hemorrhoids  . COLONOSCOPY WITH PROPOFOL N/A 04/13/2017   Procedure: COLONOSCOPY WITH PROPOFOL;  Surgeon: Daneil Dolin, MD;  Location: [facility];  Service: Endoscopy;  Laterality: N/A;  8:30am  .  CYSTOSCOPY    . ENDARTERECTOMY Right 12/12/2015   Procedure: ENDARTERECTOMY RIGHT CAROTID, RESECTION OF REDUNDANT RIGHT COMMON CAROTID ARTERY WITH PRIMARY REANASTOMOSIS;  Surgeon: Mal Misty, MD;  Location: [facility];  Service: Vascular;  Laterality: Right;  . ESOPHAGOGASTRODUODENOSCOPY  04/07/2006   PZW:CHENID esophagus s/p placement of Bravo pH probe, some surgical changes but not typical of fundoplication wrap, may have slipped  . GANGLION CYST EXCISION Right 09/21/2015   Procedure: RIGHT WRIST GANGLION CYST REMOVAL;  Surgeon: Carole Civil, MD;  Location: [facility] OR[facility];  Service: Orthopedics;  Laterality: Right;  . hemorrhoidal banding    . HERNIA REPAIR     umbilical  . KNEE ARTHROSCOPY Right   . LUMBAR LAMINECTOMY/DECOMPRESSION MICRODISCECTOMY Left 10/17/2013   Procedure: LUMBAR LAMINECTOMY/DECOMPRESSION MICRODISCECTOMY 1 LEVEL three/four;  Surgeon: Ophelia Charter, MD;  Location: [facility] NEURO ORS;  Service: Neurosurgery;  Laterality: Left;  . NECK SURGERY     fusion-   . NISSEN FUNDOPLICATION     x 2  . OLECRANON BURSECTOMY Left 01/11/2014   Procedure: OLECRANON BURSECTOMY;  Surgeon: Carole Civil, MD;  Location: [facility];  Service: Orthopedics;  Laterality: Left;  . OOPHORECTOMY Right   . PATCH ANGIOPLASTY Right 12/12/2015   Procedure: PATCH ANGIOPLASTY RIGHT CAROTID ARTERY USING HEMASHIELD PLATINUM FINESSE PATCH;  Surgeon: Mal Misty, MD;  Location: [facility];  Service: Vascular;  Laterality: Right;  . TONSILLECTOMY      OB History    No data available       Home Medications    Prior to Admission medications   Medication Sig Start Date End Date Taking? Authorizing Provider  albuterol (PROVENTIL HFA;VENTOLIN HFA) 108 (90 BASE) MCG/ACT inhaler Inhale 2 puffs into the lungs every 6 (six) hours as needed for wheezing.    [provider]  alendronate (FOSAMAX) 70 MG tablet Take 70 mg by mouth every Thursday. Take with a full glass of water on an empty stomach.    [provider]  Cholecalciferol (VITAMIN D-3) 5000 UNITS TABS Take 5,000 Units by mouth daily.    [provider]  FLUoxetine (PROZAC) 40 MG capsule Take 40 mg by mouth daily.      [provider]  ibuprofen (ADVIL,MOTRIN) 800 MG tablet Take 800 mg by mouth 2 (two) times daily as needed for moderate pain.     [provider]  lisinopril (PRINIVIL,ZESTRIL) 20 MG tablet Take 20 mg by mouth daily.    [provider]  mometasone-formoterol (DULERA) 200-5 MCG/ACT AERO Inhale 1 puff into the lungs 2 (  two) times daily.    [provider]  montelukast (SINGULAIR) 10 MG tablet Take 10 mg by mouth daily.     [provider]  polyethylene glycol-electrolytes (TRILYTE) 420 g solution Take 4,000 mLs by mouth as directed. 02/20/17   Rourk, Cristopher Estimable, MD  predniSONE (DELTASONE) 5 MG tablet Take 5 mg by mouth daily.  09/20/15   [provider]  TURMERIC CURCUMIN PO Take 1,500 mg by mouth daily.    [provider]    Family History Family History  Problem Relation Age of Onset  . Heart disease Father   . Colon cancer Neg Hx     Social History Social History  Substance Use Topics  . Smoking status: Never Smoker  . Smokeless tobacco: Never Used  . Alcohol use 6.0 oz/week    10 Cans of beer per week     Comment: couple of beers daily     Allergies   Cardura [doxazosin mesylate]; Strawberry leaves extract; Barbiturates; Levofloxacin; and Sulfa antibiotics   Review of Systems Review of Systems  Respiratory: Negative for shortness of breath.   Cardiovascular: Negative for chest pain.  Gastrointestinal: Negative for abdominal pain.  Musculoskeletal: Positive for neck pain.  Neurological: Positive for weakness, numbness and headaches.  All other systems reviewed and are negative.    Physical Exam Updated Vital Signs BP (!) 173/76   Pulse 76   Resp (!) 23   Ht 5\' 3"  (1.6 m)   Wt 56.7 kg (125 lb)   SpO2 100%   BMI 22.14  kg/m   Physical Exam  Constitutional: She is oriented to person, place, and time.  Elderly, ill-appearing, no acute distress, ABC's intact, generally disheveled  HENT:  Head: Normocephalic and atraumatic.  Dirt noted in the oropharynx, no bleeding  Eyes: Pupils are equal, round, and reactive to light.  Pupils 3 mm reactive bilaterally  Neck:  c-collar in place  Cardiovascular: Normal rate, regular rhythm and normal heart sounds.   No murmur heard. Pulmonary/Chest: Effort normal and breath sounds normal. No respiratory distress. She has no wheezes.  Abdominal: Soft. Bowel sounds are normal. There is no tenderness. There is no guarding.  Genitourinary:  Genitourinary Comments: Increased rectal tone, sensation intact  Musculoskeletal:  Contusion noted right shoulder and right hip, mild swelling noted of the right knee, no obvious deformities  No tenderness to palpation to the T or L-spine  Neurological: She is alert and oriented to person, place, and time.  Cranial nerves II through XII intact, patient with full paralysis of the right upper extremity and right lower extremity, difficult to assess sensory exam as it is inconsistent and no obvious level noted  Skin: Skin is warm and dry.  Pressure wounds bilateral knees, right hip, and right shoulder  Psychiatric: She has a normal mood and affect.  Nursing note and vitals reviewed.    ED Treatments / Results  Labs (all labs ordered are listed, but only abnormal results are displayed) Labs Reviewed  CBC - Abnormal; Notable for the following:       Result Value   WBC 14.9 (*)    RBC 2.93 (*)    Hemoglobin 9.3 (*)    HCT 27.1 (*)    All other components within normal limits  ETHANOL - Abnormal; Notable for the following:    Alcohol, Ethyl (B) 43 (*)    All other components within normal limits  I-STAT CHEM 8, ED - Abnormal; Notable for the following:  Sodium 132 (*)    Chloride 100 (*)    BUN 21 (*)    Creatinine, Ser 1.40  (*)    Glucose, Bld 108 (*)    TCO2 19 (*)    Hemoglobin 9.5 (*)    HCT 28.0 (*)    All other components within normal limits  I-STAT CG4 LACTIC ACID, ED - Abnormal; Notable for the following:    Lactic Acid, Venous 2.84 (*)    All other components within normal limits  CDS SEROLOGY  PROTIME-INR  COMPREHENSIVE METABOLIC PANEL  URINALYSIS, ROUTINE W REFLEX MICROSCOPIC  CK TOTAL AND CKMB (NOT AT [facility])  I-STAT CHEM 8, ED  SAMPLE TO BLOOD BANK    EKG  EKG Interpretation  Date/Time:  Tuesday May 05 2017 03:09:57 EDT Ventricular Rate:  95 PR Interval:    QRS Duration: 118 QT Interval:  390 QTC Calculation: 493 R Axis:   107 Text Interpretation:  Sinus rhythm Consider left atrial enlargement Incomplete right bundle branch block Nonspecific repol abnormality, lateral leads poor baseline Confirmed by Thayer Jew [phone]) on 05/05/2017 3:21:05 AM       Radiology Ct Angio Head W Or Wo Contrast  Result Date: 05/05/2017 CLINICAL DATA:  Fall.  C1 and CT cervical spine fractures. EXAM: CT ANGIOGRAPHY HEAD AND NECK TECHNIQUE: Multidetector CT imaging of the head and neck was performed using the standard protocol during bolus administration of intravenous contrast. Multiplanar CT image reconstructions and MIPs were obtained to evaluate the vascular anatomy. Carotid stenosis measurements (when applicable) are obtained utilizing NASCET criteria, using the distal internal carotid diameter as the denominator. CONTRAST:  50 mL Isovue 370 COMPARISON:  CT cervical spine 05/05/2017 FINDINGS: CTA NECK FINDINGS Aortic arch: There is no aneurysm or dissection of the visualized ascending aorta or aortic arch. Normal 3 vessel aortic branching pattern. The visualized proximal subclavian arteries are normal. There is calcific aortic atherosclerosis. Right carotid system: The right common carotid origin is widely patent. There is no common carotid or internal carotid artery dissection or aneurysm. No  hemodynamically significant stenosis. Left carotid system: The left common carotid origin is widely patent. There is no common carotid or internal carotid artery dissection or aneurysm. Atherosclerotic calcification at the carotid bifurcation without hemodynamically significant stenosis. Vertebral arteries: The vertebral system is left dominant. There is calcification at the right vertebral artery origin, but the V1 segment is widely patent. Left vertebral artery origin is normal. The left vertebral artery is normal along its entire course to the confluence of the basilar artery. The right vertebral artery passes through the fractured right C2 transverse foramen. There is no focal narrowing or other evidence of acute intimal injury. Each distal right V4 segment is markedly narrowed just proximal to the origin of the right posterior inferior cerebellar artery. Skeleton: As described on the cervical spine CT, there are fractures of the anterior aspect of the C1 ring and a comminuted fracture of the right C2 transverse process. There is again suspected to be hematoma dorsal to the dens which results in attenuation of the thecal sac, narrowing the caliber to 7 mm. Other neck: The nasopharynx is clear. The oropharynx and hypopharynx are normal. The epiglottis is normal. The supraglottic larynx, glottis and subglottic larynx are normal. No retropharyngeal collection. The parapharyngeal spaces are preserved. The parotid and submandibular glands are normal. No sialolithiasis or salivary ductal dilatation. The thyroid gland is normal. There is no cervical lymphadenopathy. Upper chest: No pneumothorax or pleural effusion. No nodules or masses.  Review of the MIP images confirms the above findings CTA HEAD FINDINGS Anterior circulation: --Intracranial internal carotid arteries: Normal. --Anterior cerebral arteries: Normal. --Middle cerebral arteries: Normal. --Posterior communicating arteries: Present bilaterally. Posterior  circulation: --Posterior cerebral arteries: Normal. --Superior cerebellar arteries: Normal. --Basilar artery: Normal. --Anterior inferior cerebellar arteries: Normal. --Posterior inferior cerebellar arteries: Normal. Venous sinuses: As permitted by contrast timing, patent. Anatomic variants: Fetal origin of the left posterior cerebral artery. Hypoplastic left anterior cerebral artery A1 segment. These are both normal congenital variants. Delayed phase: No parenchymal contrast enhancement. Review of the MIP images confirms the above findings IMPRESSION: 1. No vertebral artery dissection. No evidence of intimal injury at the site of the right C2 transverse process fracture. Redemonstration of C1 anterior arch fracture. 2. Left carotid bifurcation atherosclerotic calcification without hemodynamically significant stenosis. 3. Hyperdense material dorsal to the dens with bowing of the tectorial membrane concerning for epidural hematoma with associated moderate to severe narrowing of the thecal sac. This could be further characterized with MRI. 4.  Aortic Atherosclerosis (ICD10-I70.0). Electronically Signed   By: Ulyses Jarred M.D.   On: 05/05/2017 04:45   Ct Head Wo Contrast  Result Date: 05/05/2017 CLINICAL DATA:  Fall EXAM: CT HEAD WITHOUT CONTRAST CT CERVICAL SPINE WITHOUT CONTRAST TECHNIQUE: Multidetector CT imaging of the head and cervical spine was performed following the standard protocol without intravenous contrast. Multiplanar CT image reconstructions of the cervical spine were also generated. COMPARISON:  None. FINDINGS: CT HEAD FINDINGS Brain: No mass lesion, intraparenchymal hemorrhage or extra-axial collection. No evidence of acute cortical infarct. There is periventricular hypoattenuation compatible with chronic microvascular disease. Vascular: No hyperdense vessel or unexpected calcification. Skull: Normal visualized skull base, calvarium and extracranial soft tissues. Sinuses/Orbits: Postsurgical  changes of the paranasal sinuses with mild-to-moderate mucosal thickening, worst in the anterior ethmoid air cells. No mastoid or middle ear effusion. Normal orbits. CT CERVICAL SPINE FINDINGS Alignment: No static subluxation. Facets are aligned. Occipital condyles are normally positioned. Skull base and vertebrae: There is a minimally displaced fracture of the anterior C1 ring. There is a posterior lucency that appears chronic, but could provide a secondary break in the C1 ring. There is a comminuted fracture of the right C2 transverse process involving the transverse foramen. There is ACDF hardware at C4-C5 with fusion also at C5-C6. No other acute fractures identified. Moderate chronic height loss at C7. Soft tissues and spinal canal: Hyperattenuation surrounding the proximal cervical spinal cord, posterior to the dens may indicate epidural hematoma underlying the tectorial membrane. This markedly narrows the thecal sac. Disc levels: The left C4 and C5 facets are fused. There is multilevel severe facet arthrosis. Upper chest: No pneumothorax, pulmonary nodule or pleural effusion. Other: Normal visualized paraspinal cervical soft tissues. IMPRESSION: 1. Comminuted fracture through the right transverse foramen at C2. Dislocation may cause injury to the vertebral artery. CTA of the neck is recommended. 2. Minimally displaced fracture of the anterior C1 ring. 3. Hyperattenuating material posterior to the dens is concerning for epidural hematoma posteriorly displacing the tectorial membrane and narrowing the spinal canal. There may be a non-displaced fracture of the dens. This could be further assessed with MRI. 4. 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 Critical Value/emergent results were called by telephone at the time of interpretation on 05/05/2017 at 3:53 am to Dr. Thayer Jew , who verbally acknowledged these results. Electronically Signed   By: Ulyses Jarred M.D.   On:  05/05/2017 03:57   Ct Angio Neck W And/or Wo Contrast  Result Date: 05/05/2017 CLINICAL DATA:  Fall.  C1 and CT cervical spine fractures. EXAM: CT ANGIOGRAPHY HEAD AND NECK TECHNIQUE: Multidetector CT imaging of the head and neck was performed using the standard protocol during bolus administration of intravenous contrast. Multiplanar CT image reconstructions and MIPs were obtained to evaluate the vascular anatomy. Carotid stenosis measurements (when applicable) are obtained utilizing NASCET criteria, using the distal internal carotid diameter as the denominator. CONTRAST:  50 mL Isovue 370 COMPARISON:  CT cervical spine 05/05/2017 FINDINGS: CTA NECK FINDINGS Aortic arch: There is no aneurysm or dissection of the visualized ascending aorta or aortic arch. Normal 3 vessel aortic branching pattern. The visualized proximal subclavian arteries are normal. There is calcific aortic atherosclerosis. Right carotid system: The right common carotid origin is widely patent. There is no common carotid or internal carotid artery dissection or aneurysm. No hemodynamically significant stenosis. Left carotid system: The left common carotid origin is widely patent. There is no common carotid or internal carotid artery dissection or aneurysm. Atherosclerotic calcification at the carotid bifurcation without hemodynamically significant stenosis. Vertebral arteries: The vertebral system is left dominant. There is calcification at the right vertebral artery origin, but the V1 segment is widely patent. Left vertebral artery origin is normal. The left vertebral artery is normal along its entire course to the confluence of the basilar artery. The right vertebral artery passes through the fractured right C2 transverse foramen. There is no focal narrowing or other evidence of acute intimal injury. Each distal right V4 segment is markedly narrowed just proximal to the origin of the right posterior inferior cerebellar artery. Skeleton: As  described on the cervical spine CT, there are fractures of the anterior aspect of the C1 ring and a comminuted fracture of the right C2 transverse process. There is again suspected to be hematoma dorsal to the dens which results in attenuation of the thecal sac, narrowing the caliber to 7 mm. Other neck: The nasopharynx is clear. The oropharynx and hypopharynx are normal. The epiglottis is normal. The supraglottic larynx, glottis and subglottic larynx are normal. No retropharyngeal collection. The parapharyngeal spaces are preserved. The parotid and submandibular glands are normal. No sialolithiasis or salivary ductal dilatation. The thyroid gland is normal. There is no cervical lymphadenopathy. Upper chest: No pneumothorax or pleural effusion. No nodules or masses. Review of the MIP images confirms the above findings CTA HEAD FINDINGS Anterior circulation: --Intracranial internal carotid arteries: Normal. --Anterior cerebral arteries: Normal. --Middle cerebral arteries: Normal. --Posterior communicating arteries: Present bilaterally. Posterior circulation: --Posterior cerebral arteries: Normal. --Superior cerebellar arteries: Normal. --Basilar artery: Normal. --Anterior inferior cerebellar arteries: Normal. --Posterior inferior cerebellar arteries: Normal. Venous sinuses: As permitted by contrast timing, patent. Anatomic variants: Fetal origin of the left posterior cerebral artery. Hypoplastic left anterior cerebral artery A1 segment. These are both normal congenital variants. Delayed phase: No parenchymal contrast enhancement. Review of the MIP images confirms the above findings IMPRESSION: 1. No vertebral artery dissection. No evidence of intimal injury at the site of the right C2 transverse process fracture. Redemonstration of C1 anterior arch fracture. 2. Left carotid bifurcation atherosclerotic calcification without hemodynamically significant stenosis. 3. Hyperdense material dorsal to the dens with bowing of  the tectorial membrane concerning for epidural hematoma with associated moderate to severe narrowing of the thecal sac. This could be further characterized with MRI. 4.  Aortic Atherosclerosis (ICD10-I70.0). Electronically Signed   By: Ulyses Jarred M.D.   On: 05/05/2017 04:45   Ct Chest W Contrast  Result Date: 05/05/2017 CLINICAL DATA:  Fell outside around 23:00.  Found at 02:30 a.m. EXAM: CT CHEST, ABDOMEN, AND PELVIS WITH CONTRAST TECHNIQUE: Multidetector CT imaging of the chest, abdomen and pelvis was performed following the standard protocol during bolus administration of intravenous contrast. CONTRAST:  35mL ISOVUE-300 IOPAMIDOL (ISOVUE-300) INJECTION 61% COMPARISON:  12/07/2015, 10/17/2014. FINDINGS: CT CHEST FINDINGS Cardiovascular: No intrathoracic vascular injury. Moderate aortic atherosclerotic calcification. Normal heart size. No pericardial effusion. Mediastinum/Nodes: No enlarged mediastinal, hilar, or axillary lymph nodes. Thyroid gland, trachea, and esophagus demonstrate no significant findings. No mediastinal hematoma. Lungs/Pleura: No pneumothorax. No pleural effusion. Stable apical scarring without significant change from 01/25/2004. Airways are patent and normal in caliber. Musculoskeletal: Multiple old healed rib fracture deformities. Mild to moderate anterior wedging of T12, new from 10/17/2014 and probably new from 12/07/2015. This most likely is subacute to chronic, given the degree of reactive sclerosis. Pedicles and facet articulations are intact. The other thoracic vertebrae are normal in height. Sternum is intact. CT ABDOMEN PELVIS FINDINGS Hepatobiliary: Mild hepatic fatty infiltration without focal lesion. Cholecystectomy. Normal bile ducts. Pancreas: Unremarkable. No pancreatic ductal dilatation or surrounding inflammatory changes. Spleen: No splenic injury or perisplenic hematoma. Adrenals/Urinary Tract: No adrenal hemorrhage or renal injury identified. Bladder is unremarkable.  Stomach/Bowel: Stomach, small bowel and colon are unremarkable. Vascular/Lymphatic: No intra-abdominal vascular injury. Mild aortic atherosclerotic calcification. Normal aortic caliber. Reproductive: Uterus and bilateral adnexa are unremarkable. Other: No peritoneal blood or free air. Musculoskeletal: Unchanged chronic mild anterior wedging of L1. Moderately severe lumbar degenerative disc changes at L2-3 and L3-4. No evidence of acute fracture. IMPRESSION: 1. Mild to moderate anterior compression of T12, probably subacute to chronic. Multiple old healed rib fracture deformities. No conclusive acute fracture. 2. No evidence of vascular injury in the chest, abdomen or pelvis. No evidence of parenchymal organ injury. No pneumothorax, hemothorax, peritoneal blood or free air. 3. Moderate aortic atherosclerosis. 4. Mild hepatic steatosis. Electronically Signed   By: Andreas Newport M.D.   On: 05/05/2017 04:34   Ct Cervical Spine Wo Contrast  Result Date: 05/05/2017 CLINICAL DATA:  Fall EXAM: CT HEAD WITHOUT CONTRAST CT CERVICAL SPINE WITHOUT CONTRAST TECHNIQUE: Multidetector CT imaging of the head and cervical spine was performed following the standard protocol without intravenous contrast. Multiplanar CT image reconstructions of the cervical spine were also generated. COMPARISON:  None. FINDINGS: CT HEAD FINDINGS Brain: No mass lesion, intraparenchymal hemorrhage or extra-axial collection. No evidence of acute cortical infarct. There is periventricular hypoattenuation compatible with chronic microvascular disease. Vascular: No hyperdense vessel or unexpected calcification. Skull: Normal visualized skull base, calvarium and extracranial soft tissues. Sinuses/Orbits: Postsurgical changes of the paranasal sinuses with mild-to-moderate mucosal thickening, worst in the anterior ethmoid air cells. No mastoid or middle ear effusion. Normal orbits. CT CERVICAL SPINE FINDINGS Alignment: No static subluxation. Facets are  aligned. Occipital condyles are normally positioned. Skull base and vertebrae: There is a minimally displaced fracture of the anterior C1 ring. There is a posterior lucency that appears chronic, but could provide a secondary break in the C1 ring. There is a comminuted fracture of the right C2 transverse process involving the transverse foramen. There is ACDF hardware at C4-C5 with fusion also at C5-C6. No other acute fractures identified. Moderate chronic height loss at C7. Soft tissues and spinal canal: Hyperattenuation surrounding the proximal cervical spinal cord, posterior to the dens may indicate epidural hematoma underlying the tectorial membrane. This markedly narrows the thecal sac. Disc levels: The left C4 and C5 facets are fused. There is multilevel severe facet arthrosis.  Upper chest: No pneumothorax, pulmonary nodule or pleural effusion. Other: Normal visualized paraspinal cervical soft tissues. IMPRESSION: 1. Comminuted fracture through the right transverse foramen at C2. Dislocation may cause injury to the vertebral artery. CTA of the neck is recommended. 2. Minimally displaced fracture of the anterior C1 ring. 3. Hyperattenuating material posterior to the dens is concerning for epidural hematoma posteriorly displacing the tectorial membrane and narrowing the spinal canal. There may be a non-displaced fracture of the dens. This could be further assessed with MRI. 4. 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 Critical Value/emergent results were called by telephone at the time of interpretation on 05/05/2017 at 3:53 am to Dr. Thayer Jew , who verbally acknowledged these results. Electronically Signed   By: Ulyses Jarred M.D.   On: 05/05/2017 03:57   Ct Abdomen Pelvis W Contrast  Result Date: 05/05/2017 CLINICAL DATA:  [place] outside around 23:00.  Found at 02:30 a.m. EXAM: CT CHEST, ABDOMEN, AND PELVIS WITH CONTRAST TECHNIQUE: Multidetector CT imaging of the  chest, abdomen and pelvis was performed following the standard protocol during bolus administration of intravenous contrast. CONTRAST:  89mL ISOVUE-300 IOPAMIDOL (ISOVUE-300) INJECTION 61% COMPARISON:  12/07/2015, 10/17/2014. FINDINGS: CT CHEST FINDINGS Cardiovascular: No intrathoracic vascular injury. Moderate aortic atherosclerotic calcification. Normal heart size. No pericardial effusion. Mediastinum/Nodes: No enlarged mediastinal, hilar, or axillary lymph nodes. Thyroid gland, trachea, and esophagus demonstrate no significant findings. No mediastinal hematoma. Lungs/Pleura: No pneumothorax. No pleural effusion. Stable apical scarring without significant change from 01/25/2004. Airways are patent and normal in caliber. Musculoskeletal: Multiple old healed rib fracture deformities. Mild to moderate anterior wedging of T12, new from 10/17/2014 and probably new from 12/07/2015. This most likely is subacute to chronic, given the degree of reactive sclerosis. Pedicles and facet articulations are intact. The other thoracic vertebrae are normal in height. Sternum is intact. CT ABDOMEN PELVIS FINDINGS Hepatobiliary: Mild hepatic fatty infiltration without focal lesion. Cholecystectomy. Normal bile ducts. Pancreas: Unremarkable. No pancreatic ductal dilatation or surrounding inflammatory changes. Spleen: No splenic injury or perisplenic hematoma. Adrenals/Urinary Tract: No adrenal hemorrhage or renal injury identified. Bladder is unremarkable. Stomach/Bowel: Stomach, small bowel and colon are unremarkable. Vascular/Lymphatic: No intra-abdominal vascular injury. Mild aortic atherosclerotic calcification. Normal aortic caliber. Reproductive: Uterus and bilateral adnexa are unremarkable. Other: No peritoneal blood or free air. Musculoskeletal: Unchanged chronic mild anterior wedging of L1. Moderately severe lumbar degenerative disc changes at L2-3 and L3-4. No evidence of acute fracture. IMPRESSION: 1. Mild to moderate  anterior compression of T12, probably subacute to chronic. Multiple old healed rib fracture deformities. No conclusive acute fracture. 2. No evidence of vascular injury in the chest, abdomen or pelvis. No evidence of parenchymal organ injury. No pneumothorax, hemothorax, peritoneal blood or free air. 3. Moderate aortic atherosclerosis. 4. Mild hepatic steatosis. Electronically Signed   By: Andreas Newport M.D.   On: 05/05/2017 04:34   Dg Chest Port 1 View  Result Date: 05/05/2017 CLINICAL DATA:  Fall, history of asthma EXAM: PORTABLE CHEST 1 VIEW COMPARISON:  12/07/2015 FINDINGS: Postsurgical changes of the cervical spine. Mild hyperinflation. No acute consolidation or effusion. Normal heart size. Aortic atherosclerosis. No pneumothorax. Multiple old appearing right rib fractures. Possible acute left sixth rib fracture IMPRESSION: 1. Negative for pneumothorax or pleural effusion 2. Possible acute left sixth rib fracture Electronically Signed   By: Donavan Foil M.D.   On: 05/05/2017 04:24   Dg Shoulder Right Portable  Result Date: 05/05/2017 CLINICAL DATA:  Fall with neck injury EXAM: PORTABLE RIGHT SHOULDER COMPARISON:  Radiograph 05/05/2017 FINDINGS: AC joint is intact. No definite acute displaced fracture or dislocation on single view. Old right rib fractures IMPRESSION: No definite acute osseous abnormality Electronically Signed   By: Donavan Foil M.D.   On: 05/05/2017 04:27   Dg Knee Right Port  Result Date: 05/05/2017 CLINICAL DATA:  Fall with pain EXAM: PORTABLE RIGHT KNEE - 1-2 VIEW COMPARISON:  04/10/2015 FINDINGS: Mild lateral deviation of the patella moderate patellofemoral degenerative changes. Possible joint space calcifications superiorly. Marked arthritis of the lateral compartment with mild arthritis of the medial compartment. No acute fracture is seen IMPRESSION: 1. Minimal lateral deviation of patella 2. Moderate severe arthritis of the knee.  No acute fracture is seen 3. Possible  superior joint space calcifications Electronically Signed   By: Donavan Foil M.D.   On: 05/05/2017 04:26    Procedures Procedures (including critical care time)  CRITICAL CARE Performed by: Merryl Hacker   Total critical care time: 65 minutes  Critical care time was exclusive of separately billable procedures and treating other patients.  Critical care was necessary to treat or prevent imminent or life-threatening deterioration.  Critical care was time spent personally by me on the following activities: development of treatment plan with patient and/or surrogate as well as nursing, discussions with consultants, evaluation of patient's response to treatment, examination of patient, obtaining history from patient or surrogate, ordering and performing treatments and interventions, ordering and review of laboratory studies, ordering and review of radiographic studies, pulse oximetry and re-evaluation of patient's condition.   Medications Ordered in ED Medications  Tdap (BOOSTRIX) injection 0.5 mL (0.5 mLs Intramuscular Given 05/05/17 0440)  morphine 4 MG/ML injection 4 mg (4 mg Intravenous Given 05/05/17 0440)  ondansetron (ZOFRAN) injection 4 mg (4 mg Intravenous Given 05/05/17 0440)  iopamidol (ISOVUE-300) 61 % injection 75 mL (75 mLs Intravenous Contrast Given 05/05/17 0326)  iopamidol (ISOVUE-370) 76 % injection 50 mL (50 mLs Intravenous Contrast Given 05/05/17 0412)  HYDROmorphone (DILAUDID) injection 1 mg (1 mg Intravenous Given 05/05/17 0529)     Initial Impression / Assessment and Plan / ED Course  I have reviewed the triage vital signs and the nursing notes.  Pertinent labs & imaging results that were available during my care of the patient were reviewed by me and considered in my medical decision making (see chart for details).  Clinical Course as of May 05 536  Tue May 05, 2017  0415 Discussed with Dr. Cyndy Freeze neurosurgery. He recommends MRI.    [CH]  0425 Discussed with  Dr. Betsey Holiday, Mrs. [facility] ED. Plan for EDD ED transfer for MRI. Lab work and additional CT scans pending at this time. This does not change disposition.  [CH]  0434 Repeat neurologic evaluation, patientseems to be more spastic now but continues to have no control of the right upper extremity. She now can wiggle her toes and has some resistance to gravity with hip flexion. Decreased sensation right lower extremity.  [CH]    Clinical Course User Index [CH] Kynlee Koenigsberg, Barbette Hair, MD    Patient presents following a fall. Concerning neurologic exam. Somewhat inconsistent without a definitive spinal level. Given neck pain would be concerned for high C-spine fracture. Currently her ABCs are intact. She has multiple contusions.  Unclear etiology of fall. No arrhythmia on EKG. Given that she is hemiparetic, question whether or not she could've also had a stroke causing the fall and weakness. Patient does not recall. Patient was sent emergently to the CT scanner for CT.  History of chronic kidney disease with baseline creatinine of 1.5-1.6. However, given severity of exam findings, feel CT scan with contrast is warranted. I discussed the CT neck findings with the radiologist. patient with C1 and C2 C-spine fractures and concern for epidural blood at the dens. She is not on anticoagulants.He recommends a CT angiogram to rule out dissection. This was performed. See repeat exam above. Patient was discussed with Dr. Cyndy Freeze, neurosurgery. He recommends MRI.Patient was additionally discussed with Dr. Betsey Holiday for ED to ED per transfer for MRI. Patient was somewhat improving neurologic exam and now can move her toes on the right with some hip flexion to gravity area.  She seems more spastic.  Patient was given pain medication.Additional lab work is pending. CT chest abdomen and pelvis without additional acute traumatic injuries. Multiple old rib fractures and age indeterminate T12 fracture.at this time, her injuries appear isolated  neurologic. Trauma consultation deferred.  Patient to be transferred to [facility] for definitive evaluation by neurosurgery. Complete spine precautions recommended for transport.  Final Clinical Impressions(s) / ED Diagnoses   Final diagnoses:  Other closed nondisplaced fracture of first cervical vertebra, initial encounter ([place])  Other closed displaced fracture of second cervical vertebra, initial encounter ([place])  Paralysis ([place])  Fall, initial encounter  Epidural hematoma [facility])    New Prescriptions New Prescriptions   No medications on file     Merryl Hacker, MD 05/05/17 3112    Merryl Hacker, MD 05/05/17 [phone]

## 2017-05-05 NOTE — Consult Note (Signed)
Chief Complaint   Chief Complaint  Patient presents with  . Fall    HPI   HPI: Jordan [name] is a 71 y.o. female who presented to [facility] ER early this morning after falling in driveway roughly [id number] yesterday evening. She was unable to move the right side of her body after the fall and laid in the driveway until a neighbor found her and called EMS. She does not remember the events surrounding the fall. Does endorse LOC but unsure how long. Does endorse drinking at least 7 alcoholic beverages (multiple shots of liquor and multiple cans of beer). Complains of bilateral neck pain, occipital headache. Continues to have difficulties moving RUE/RLE. Left side is unaffected. No changes in vision.    Patient Active Problem List   Diagnosis Date Noted  . Anemia 02/20/2017  . History of colon polyps 02/20/2017  . Chronic diarrhea 02/20/2017  . Abnormal weight loss 02/20/2017  . Carotid stenosis, symptomatic w/o infarct 01/01/2016  . Carotid stenosis 11/27/2015  . Ganglion cyst of wrist   . Hematoma 01/23/2014  . Lumbar foraminal stenosis 10/17/2013  . Olecranon bursitis of left elbow 09/14/2013  . Left elbow pain 09/14/2013  . C. difficile diarrhea 06/20/2013  . Loose stools 03/23/2013  . 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 PALPITATIONS 05/04/2009  . CHEST PAIN UNSPECIFIED 05/04/2009  . HYPERTENSION 04/24/2009  . ASTHMA 04/24/2009  . COPD 04/24/2009    PMH: Past Medical History:  Diagnosis Date  . Allergy    seasonal  . Anemia   . Arthritis   . Asthma    uses Advair and Spiriva daily,Albuterol prn.Takes Singulair at bedtime and Prednisone daily  . Back spasm    takes Zanaflex daily as needed  . Blood transfusion 2007   no abnormal reation noted   . Bruises easily    d/t being on Prednisone  . Bursitis    left elbow  . C. difficile colitis    2007: treated with Flagyl and Vanc, 2014: treated with vanc, Aug 2014: Vanc taper  . Chronic back pain   . Chronic kidney disease     "creatine" creeping up - followed at this time by PCP  . Complication of anesthesia 2000   woke up during one surgery- ovary surgery  . COPD (chronic obstructive pulmonary disease) ([place])   . Depression    takes Prozac daily  . H/O hiatal hernia   . Hemorrhoids   . History of bronchitis    last time a yr ago  . History of colon polyps   . History of kidney stones   . History of kidney stones   . Hyperlipidemia    hx of-was on meds but has been off x 1 1/2 yrs  . Hypertension    takes Verapamil nightly  . IBS (irritable bowel syndrome)   . Insomnia    takes Benadryl as needed  . 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 Nocturia   . Osteoporosis    takes Fosamax weekly  . Pneumonia    MRSA pneumonia in 2007  . Shortness of breath    with exertion daily  . Stroke [facility])    "they say I has a small stroke".No deficits  . Tingling    and pain in left leg  . Urinary frequency     PSH: Past Surgical History:  Procedure Laterality Date  . APPENDECTOMY    . BIOPSY  04/13/2017   Procedure: BIOPSY;  Surgeon: Daneil Dolin,  MD;  Location: [facility];  Service: Endoscopy;;  right and left colon   . CARPAL TUNNEL RELEASE Left   . CARPAL TUNNEL RELEASE Right 09/21/2015   Procedure: CARPAL TUNNEL RELEASE;  Surgeon: Carole Civil, MD;  Location: [facility] ORS;  Service: Orthopedics;  Laterality: Right;  . cataract surgery Bilateral   . CHOLECYSTECTOMY    . COLONOSCOPY   09/24/2006   RMR: Normal rectum, left-sided diverticula  . COLONOSCOPY  Oct 2012   Dr. Henrene Pastor: tubular adenomas, moderative diverticulosis, internal hemorrhoids  . COLONOSCOPY WITH PROPOFOL N/A 04/13/2017   Procedure: COLONOSCOPY WITH PROPOFOL;  Surgeon: Daneil Dolin, MD;  Location: [facility];  Service: Endoscopy;  Laterality: N/A;  8:30am  . CYSTOSCOPY    . ENDARTERECTOMY Right 12/12/2015   Procedure: ENDARTERECTOMY RIGHT CAROTID, RESECTION OF REDUNDANT RIGHT COMMON CAROTID ARTERY WITH  PRIMARY REANASTOMOSIS;  Surgeon: Mal Misty, MD;  Location: [place];  Service: Vascular;  Laterality: Right;  . ESOPHAGOGASTRODUODENOSCOPY  04/07/2006   XEN:MMHWKG esophagus s/p placement of Bravo pH probe, some surgical changes but not typical of fundoplication wrap, may have slipped  . GANGLION CYST EXCISION Right 09/21/2015   Procedure: RIGHT WRIST GANGLION CYST REMOVAL;  Surgeon: Carole Civil, MD;  Location: [facility];  Service: Orthopedics;  Laterality: Right;  . hemorrhoidal banding    . HERNIA REPAIR     umbilical  . KNEE ARTHROSCOPY Right   . LUMBAR LAMINECTOMY/DECOMPRESSION MICRODISCECTOMY Left 10/17/2013   Procedure: LUMBAR LAMINECTOMY/DECOMPRESSION MICRODISCECTOMY 1 LEVEL three/four;  Surgeon: Ophelia Charter, MD;  Location: [place] [facility];  Service: Neurosurgery;  Laterality: Left;  . NECK SURGERY     fusion-   . NISSEN FUNDOPLICATION     x 2  . OLECRANON BURSECTOMY Left 01/11/2014   Procedure: OLECRANON BURSECTOMY;  Surgeon: Carole Civil, MD;  Location: [facility];  Service: Orthopedics;  Laterality: Left;  . OOPHORECTOMY Right   . PATCH ANGIOPLASTY Right 12/12/2015   Procedure: PATCH ANGIOPLASTY RIGHT CAROTID ARTERY USING HEMASHIELD PLATINUM FINESSE PATCH;  Surgeon: Mal Misty, MD;  Location: [place];  Service: Vascular;  Laterality: Right;  . TONSILLECTOMY       (Not in a hospital admission)  SH: Social History  Substance Use Topics  . Smoking status: Never Smoker  . Smokeless tobacco: Never Used  . Alcohol use 6.0 oz/week    10 Cans of beer per week     Comment: couple of beers daily    MEDS: Prior to Admission medications   Medication Sig Start Date End Date Taking? Authorizing Provider  alendronate (FOSAMAX) 70 MG tablet Take 70 mg by mouth every Thursday. Take with a full glass of water on an empty stomach.   Yes [provider]  Cholecalciferol (VITAMIN D-3) 5000 UNITS TABS Take 5,000 Units by mouth daily.   Yes [provider]   FLUoxetine (PROZAC) 40 MG capsule Take 40 mg by mouth daily.     Yes [provider]  lisinopril (PRINIVIL,ZESTRIL) 20 MG tablet Take 20 mg by mouth daily.   Yes [provider]  mometasone-formoterol (DULERA) 200-5 MCG/ACT AERO Inhale 1 puff into the lungs 2 (two) times daily.   Yes [provider]  montelukast (SINGULAIR) 10 MG tablet Take 10 mg by mouth daily.    Yes [provider]  predniSONE (DELTASONE) 5 MG tablet Take 5 mg by mouth daily.  09/20/15  Yes [provider]  polyethylene glycol-electrolytes (TRILYTE) 420 g solution Take 4,000 mLs  by mouth as directed. Patient not taking: Reported on 05/05/2017 02/20/17   Rourk, Cristopher Estimable, MD    ALLERGY: Allergies  Allergen Reactions  . Cardura [Doxazosin Mesylate] Hives  . Strawberry Leaves Extract Hives  . Barbiturates Nausea And Vomiting and Rash  . Levofloxacin Rash  . Sulfa Antibiotics Nausea Only and Rash    Social History  Substance Use Topics  . Smoking status: Never Smoker  . Smokeless tobacco: Never Used  . Alcohol use 6.0 oz/week    10 Cans of beer per week     Comment: couple of beers daily     Family History  Problem Relation Age of Onset  . Heart disease Father   . Colon cancer Neg Hx      ROS   Review of Systems  HENT: Negative.   Eyes: Negative for blurred vision and double vision.  Gastrointestinal: Positive for nausea. Negative for vomiting.  Genitourinary: Negative.   Musculoskeletal: Positive for back pain, falls, myalgias and neck pain.  Neurological: Positive for tingling (RUE and RLE), sensory change (RUE & RLE), focal weakness (RLE and RUE), loss of consciousness and headaches. Negative for dizziness, tremors, speech change and seizures.    Exam   Vitals:   05/05/17 0845 05/05/17 1200  BP: (!) 149/76 (!) 137/59  Pulse: 86 84  Resp: 18 18  Temp:    SpO2: 100% 99%   General appearance: elderly, ill appearing, multiple bruises on face and right  side. Abrasions noted. Eyes: PERRL, Fundoscopic: normal Cardiovascular: Regular rate and rhythm without murmurs, rubs, gallops. No edema or variciosities. Distal pulses normal. Pulmonary: Clear to auscultation Musculoskeletal:     Muscle tone upper extremities: Normal    Muscle tone lower extremities: Normal    Motor exam: Upper Extremities Deltoid Bicep Tricep Grip  Right 5/5 5/5 5/5 5/5  Left 0/5 0/5 0/5 0/5   Lower Extremity IP Quad PF DF EHL  Right 5/5 5/5 5/5 5/5 5/5  Left 0/5 0/5 0/5 0/5 0/5   Neurological Awake, alert, oriented Memory and concentration grossly intact Speech fluent, appropriate CN grossly intact DTR: Normal Sensation exam inconsistent.  Results - Imaging/Labs   Results for orders placed or performed during the hospital encounter of 05/05/17 (from the past 48 hour(s))  CDS serology     Status: None   Collection Time: 05/05/17  4:40 AM  Result Value Ref Range   CDS serology specimen SAMPLE AVAILABLE FOR TESTING   Comprehensive metabolic panel     Status: Abnormal   Collection Time: 05/05/17  4:40 AM  Result Value Ref Range   Sodium 130 (L) 135 - 145 mmol/L   Potassium 4.6 3.5 - 5.1 mmol/L   Chloride 102 101 - 111 mmol/L   CO2 19 (L) 22 - 32 mmol/L   Glucose, Bld 113 (H) 65 - 99 mg/dL   BUN 22 (H) 6 - 20 mg/dL   Creatinine, Ser 1.45 (H) 0.44 - 1.00 mg/dL   Calcium 8.5 (L) 8.9 - 10.3 mg/dL   Total Protein 6.5 6.5 - 8.1 g/dL   Albumin 3.5 3.5 - 5.0 g/dL   AST 30 15 - 41 U/L   ALT 19 14 - 54 U/L   Alkaline Phosphatase 55 38 - 126 U/L   Total Bilirubin 0.4 0.3 - 1.2 mg/dL   GFR calc non Af Amer 35 (L) >60 mL/min   GFR calc Af Amer 41 (L) >60 mL/min    Comment: (NOTE) The eGFR has been calculated using the  CKD EPI equation. This calculation has not been validated in all clinical situations. eGFR's persistently <60 mL/min signify possible Chronic Kidney Disease.    Anion gap 9 5 - 15  CBC     Status: Abnormal   Collection Time: 05/05/17  4:40 AM   Result Value Ref Range   WBC 14.9 (H) 4.0 - 10.5 K/uL   RBC 2.93 (L) 3.87 - 5.11 MIL/uL   Hemoglobin 9.3 (L) 12.0 - 15.0 g/dL   HCT 27.1 (L) 36.0 - 46.0 %   MCV 92.5 78.0 - 100.0 fL   MCH 31.7 26.0 - 34.0 pg   MCHC 34.3 30.0 - 36.0 g/dL   RDW 13.0 11.5 - 15.5 %   Platelets 190 150 - 400 K/uL  Ethanol     Status: Abnormal   Collection Time: 05/05/17  4:40 AM  Result Value Ref Range   Alcohol, Ethyl (B) 43 (H) <5 mg/dL    Comment:        LOWEST DETECTABLE LIMIT FOR SERUM ALCOHOL IS 5 mg/dL FOR MEDICAL PURPOSES ONLY   Protime-INR     Status: None   Collection Time: 05/05/17  4:40 AM  Result Value Ref Range   Prothrombin Time 13.1 11.4 - 15.2 seconds   INR 0.99   CK total and CKMB     Status: Abnormal   Collection Time: 05/05/17  4:40 AM  Result Value Ref Range   Total CK 236 (H) 38 - 234 U/L   CK, MB 7.0 (H) 0.5 - 5.0 ng/mL   Relative Index 3.0 (H) 0.0 - 2.5    Comment: Performed at [facility], [address], [place], [place] [address]  I-Stat CG4 Lactic Acid, ED     Status: Abnormal   Collection Time: 05/05/17  4:50 AM  Result Value Ref Range   Lactic Acid, Venous 2.84 (HH) 0.5 - 1.9 mmol/L  I-Stat Chem 8, ED     Status: Abnormal   Collection Time: 05/05/17  4:51 AM  Result Value Ref Range   Sodium 132 (L) 135 - 145 mmol/L   Potassium 4.6 3.5 - 5.1 mmol/L   Chloride 100 (L) 101 - 111 mmol/L   BUN 21 (H) 6 - 20 mg/dL   Creatinine, Ser 1.40 (H) 0.44 - 1.00 mg/dL   Glucose, Bld 108 (H) 65 - 99 mg/dL   Calcium, Ion 1.17 1.15 - 1.40 mmol/L   TCO2 19 (L) 22 - 32 mmol/L   Hemoglobin 9.5 (L) 12.0 - 15.0 g/dL   HCT 28.0 (L) 36.0 - 46.0 %  Sample to Blood Bank     Status: None   Collection Time: 05/05/17  5:11 AM  Result Value Ref Range   Blood Bank Specimen SAMPLE AVAILABLE FOR TESTING    Sample Expiration 05/08/2017   Urinalysis, Routine w reflex microscopic     Status: Abnormal   Collection Time: 05/05/17  5:39 AM  Result Value Ref Range   Color, Urine YELLOW  YELLOW   APPearance CLEAR CLEAR   Specific Gravity, Urine 1.010 1.005 - 1.030   pH 6.0 5.0 - 8.0   Glucose, UA NEGATIVE NEGATIVE mg/dL   Hgb urine dipstick TRACE (A) NEGATIVE   Bilirubin Urine NEGATIVE NEGATIVE   Ketones, ur NEGATIVE NEGATIVE mg/dL   Protein, ur NEGATIVE NEGATIVE mg/dL   Nitrite NEGATIVE NEGATIVE   Leukocytes, UA NEGATIVE NEGATIVE  Urinalysis, Microscopic (reflex)     Status: None   Collection Time: 05/05/17  5:39 AM  Result Value  Ref Range   RBC / HPF 0-5 0 - 5 RBC/hpf   WBC, UA 0-5 0 - 5 WBC/hpf   Bacteria, UA NONE SEEN NONE SEEN   Squamous Epithelial / LPF NONE SEEN NONE SEEN    Ct Angio Head W Or Wo Contrast  Result Date: 05/05/2017 CLINICAL DATA:  Fall.  C1 and CT cervical spine fractures. EXAM: CT ANGIOGRAPHY HEAD AND NECK TECHNIQUE: Multidetector CT imaging of the head and neck was performed using the standard protocol during bolus administration of intravenous contrast. Multiplanar CT image reconstructions and MIPs were obtained to evaluate the vascular anatomy. Carotid stenosis measurements (when applicable) are obtained utilizing NASCET criteria, using the distal internal carotid diameter as the denominator. CONTRAST:  50 mL Isovue 370 COMPARISON:  CT cervical spine 05/05/2017 FINDINGS: CTA NECK FINDINGS Aortic arch: There is no aneurysm or dissection of the visualized ascending aorta or aortic arch. Normal 3 vessel aortic branching pattern. The visualized proximal subclavian arteries are normal. There is calcific aortic atherosclerosis. Right carotid system: The right common carotid origin is widely patent. There is no common carotid or internal carotid artery dissection or aneurysm. No hemodynamically significant stenosis. Left carotid system: The left common carotid origin is widely patent. There is no common carotid or internal carotid artery dissection or aneurysm. Atherosclerotic calcification at the carotid bifurcation without hemodynamically significant  stenosis. Vertebral arteries: The vertebral system is left dominant. There is calcification at the right vertebral artery origin, but the V1 segment is widely patent. Left vertebral artery origin is normal. The left vertebral artery is normal along its entire course to the confluence of the basilar artery. The right vertebral artery passes through the fractured right C2 transverse foramen. There is no focal narrowing or other evidence of acute intimal injury. Each distal right V4 segment is markedly narrowed just proximal to the origin of the right posterior inferior cerebellar artery. Skeleton: As described on the cervical spine CT, there are fractures of the anterior aspect of the C1 ring and a comminuted fracture of the right C2 transverse process. There is again suspected to be hematoma dorsal to the dens which results in attenuation of the thecal sac, narrowing the caliber to 7 mm. Other neck: The nasopharynx is clear. The oropharynx and hypopharynx are normal. The epiglottis is normal. The supraglottic larynx, glottis and subglottic larynx are normal. No retropharyngeal collection. The parapharyngeal spaces are preserved. The parotid and submandibular glands are normal. No sialolithiasis or salivary ductal dilatation. The thyroid gland is normal. There is no cervical lymphadenopathy. Upper chest: No pneumothorax or pleural effusion. No nodules or masses. Review of the MIP images confirms the above findings CTA HEAD FINDINGS Anterior circulation: --Intracranial internal carotid arteries: Normal. --Anterior cerebral arteries: Normal. --Middle cerebral arteries: Normal. --Posterior communicating arteries: Present bilaterally. Posterior circulation: --Posterior cerebral arteries: Normal. --Superior cerebellar arteries: Normal. --Basilar artery: Normal. --Anterior inferior cerebellar arteries: Normal. --Posterior inferior cerebellar arteries: Normal. Venous sinuses: As permitted by contrast timing, patent. Anatomic  variants: Fetal origin of the left posterior cerebral artery. Hypoplastic left anterior cerebral artery A1 segment. These are both normal congenital variants. Delayed phase: No parenchymal contrast enhancement. Review of the MIP images confirms the above findings IMPRESSION: 1. No vertebral artery dissection. No evidence of intimal injury at the site of the right C2 transverse process fracture. Redemonstration of C1 anterior arch fracture. 2. Left carotid bifurcation atherosclerotic calcification without hemodynamically significant stenosis. 3. Hyperdense material dorsal to the dens with bowing of the tectorial membrane concerning for  epidural hematoma with associated moderate to severe narrowing of the thecal sac. This could be further characterized with MRI. 4.  Aortic Atherosclerosis (ICD10-I70.0). Electronically Signed   By: Ulyses Jarred M.D.   On: 05/05/2017 04:45   Ct Head Wo Contrast  Result Date: 05/05/2017 CLINICAL DATA:  Fall EXAM: CT HEAD WITHOUT CONTRAST CT CERVICAL SPINE WITHOUT CONTRAST TECHNIQUE: Multidetector CT imaging of the head and cervical spine was performed following the standard protocol without intravenous contrast. Multiplanar CT image reconstructions of the cervical spine were also generated. COMPARISON:  None. FINDINGS: CT HEAD FINDINGS Brain: No mass lesion, intraparenchymal hemorrhage or extra-axial collection. No evidence of acute cortical infarct. There is periventricular hypoattenuation compatible with chronic microvascular disease. Vascular: No hyperdense vessel or unexpected calcification. Skull: Normal visualized skull base, calvarium and extracranial soft tissues. Sinuses/Orbits: Postsurgical changes of the paranasal sinuses with mild-to-moderate mucosal thickening, worst in the anterior ethmoid air cells. No mastoid or middle ear effusion. Normal orbits. CT CERVICAL SPINE FINDINGS Alignment: No static subluxation. Facets are aligned. Occipital condyles are normally  positioned. Skull base and vertebrae: There is a minimally displaced fracture of the anterior C1 ring. There is a posterior lucency that appears chronic, but could provide a secondary break in the C1 ring. There is a comminuted fracture of the right C2 transverse process involving the transverse foramen. There is ACDF hardware at C4-C5 with fusion also at C5-C6. No other acute fractures identified. Moderate chronic height loss at C7. Soft tissues and spinal canal: Hyperattenuation surrounding the proximal cervical spinal cord, posterior to the dens may indicate epidural hematoma underlying the tectorial membrane. This markedly narrows the thecal sac. Disc levels: The left C4 and C5 facets are fused. There is multilevel severe facet arthrosis. Upper chest: No pneumothorax, pulmonary nodule or pleural effusion. Other: Normal visualized paraspinal cervical soft tissues. IMPRESSION: 1. Comminuted fracture through the right transverse foramen at C2. Dislocation may cause injury to the vertebral artery. CTA of the neck is recommended. 2. Minimally displaced fracture of the anterior C1 ring. 3. Hyperattenuating material posterior to the dens is concerning for epidural hematoma posteriorly displacing the tectorial membrane and narrowing the spinal canal. There may be a non-displaced fracture of the dens. This could be further assessed with MRI. 4. 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 Critical Value/emergent results were called by telephone at the time of interpretation on 05/05/2017 at 3:53 am to Dr. Thayer Jew , who verbally acknowledged these results. Electronically Signed   By: Ulyses Jarred M.D.   On: 05/05/2017 03:57   Ct Angio Neck W And/or Wo Contrast  Result Date: 05/05/2017 CLINICAL DATA:  Fall.  C1 and CT cervical spine fractures. EXAM: CT ANGIOGRAPHY HEAD AND NECK TECHNIQUE: Multidetector CT imaging of the head and neck was performed using the standard protocol  during bolus administration of intravenous contrast. Multiplanar CT image reconstructions and MIPs were obtained to evaluate the vascular anatomy. Carotid stenosis measurements (when applicable) are obtained utilizing NASCET criteria, using the distal internal carotid diameter as the denominator. CONTRAST:  50 mL Isovue 370 COMPARISON:  CT cervical spine 05/05/2017 FINDINGS: CTA NECK FINDINGS Aortic arch: There is no aneurysm or dissection of the visualized ascending aorta or aortic arch. Normal 3 vessel aortic branching pattern. The visualized proximal subclavian arteries are normal. There is calcific aortic atherosclerosis. Right carotid system: The right common carotid origin is widely patent. There is no common carotid or internal carotid artery dissection or aneurysm. No hemodynamically significant stenosis. Left carotid system:  The left common carotid origin is widely patent. There is no common carotid or internal carotid artery dissection or aneurysm. Atherosclerotic calcification at the carotid bifurcation without hemodynamically significant stenosis. Vertebral arteries: The vertebral system is left dominant. There is calcification at the right vertebral artery origin, but the V1 segment is widely patent. Left vertebral artery origin is normal. The left vertebral artery is normal along its entire course to the confluence of the basilar artery. The right vertebral artery passes through the fractured right C2 transverse foramen. There is no focal narrowing or other evidence of acute intimal injury. Each distal right V4 segment is markedly narrowed just proximal to the origin of the right posterior inferior cerebellar artery. Skeleton: As described on the cervical spine CT, there are fractures of the anterior aspect of the C1 ring and a comminuted fracture of the right C2 transverse process. There is again suspected to be hematoma dorsal to the dens which results in attenuation of the thecal sac, narrowing the  caliber to 7 mm. Other neck: The nasopharynx is clear. The oropharynx and hypopharynx are normal. The epiglottis is normal. The supraglottic larynx, glottis and subglottic larynx are normal. No retropharyngeal collection. The parapharyngeal spaces are preserved. The parotid and submandibular glands are normal. No sialolithiasis or salivary ductal dilatation. The thyroid gland is normal. There is no cervical lymphadenopathy. Upper chest: No pneumothorax or pleural effusion. No nodules or masses. Review of the MIP images confirms the above findings CTA HEAD FINDINGS Anterior circulation: --Intracranial internal carotid arteries: Normal. --Anterior cerebral arteries: Normal. --Middle cerebral arteries: Normal. --Posterior communicating arteries: Present bilaterally. Posterior circulation: --Posterior cerebral arteries: Normal. --Superior cerebellar arteries: Normal. --Basilar artery: Normal. --Anterior inferior cerebellar arteries: Normal. --Posterior inferior cerebellar arteries: Normal. Venous sinuses: As permitted by contrast timing, patent. Anatomic variants: Fetal origin of the left posterior cerebral artery. Hypoplastic left anterior cerebral artery A1 segment. These are both normal congenital variants. Delayed phase: No parenchymal contrast enhancement. Review of the MIP images confirms the above findings IMPRESSION: 1. No vertebral artery dissection. No evidence of intimal injury at the site of the right C2 transverse process fracture. Redemonstration of C1 anterior arch fracture. 2. Left carotid bifurcation atherosclerotic calcification without hemodynamically significant stenosis. 3. Hyperdense material dorsal to the dens with bowing of the tectorial membrane concerning for epidural hematoma with associated moderate to severe narrowing of the thecal sac. This could be further characterized with MRI. 4.  Aortic Atherosclerosis (ICD10-I70.0). Electronically Signed   By: Ulyses Jarred M.D.   On: 05/05/2017 04:45    Ct Chest W Contrast  Result Date: 05/05/2017 CLINICAL DATA:  [place] outside around 23:00.  Found at 02:30 a.m. EXAM: CT CHEST, ABDOMEN, AND PELVIS WITH CONTRAST TECHNIQUE: Multidetector CT imaging of the chest, abdomen and pelvis was performed following the standard protocol during bolus administration of intravenous contrast. CONTRAST:  4m ISOVUE-300 IOPAMIDOL (ISOVUE-300) INJECTION 61% COMPARISON:  12/07/2015, 10/17/2014. FINDINGS: CT CHEST FINDINGS Cardiovascular: No intrathoracic vascular injury. Moderate aortic atherosclerotic calcification. Normal heart size. No pericardial effusion. Mediastinum/Nodes: No enlarged mediastinal, hilar, or axillary lymph nodes. Thyroid gland, trachea, and esophagus demonstrate no significant findings. No mediastinal hematoma. Lungs/Pleura: No pneumothorax. No pleural effusion. Stable apical scarring without significant change from 01/25/2004. Airways are patent and normal in caliber. Musculoskeletal: Multiple old healed rib fracture deformities. Mild to moderate anterior wedging of T12, new from 10/17/2014 and probably new from 12/07/2015. This most likely is subacute to chronic, given the degree of reactive sclerosis. Pedicles and facet articulations are  intact. The other thoracic vertebrae are normal in height. Sternum is intact. CT ABDOMEN PELVIS FINDINGS Hepatobiliary: Mild hepatic fatty infiltration without focal lesion. Cholecystectomy. Normal bile ducts. Pancreas: Unremarkable. No pancreatic ductal dilatation or surrounding inflammatory changes. Spleen: No splenic injury or perisplenic hematoma. Adrenals/Urinary Tract: No adrenal hemorrhage or renal injury identified. Bladder is unremarkable. Stomach/Bowel: Stomach, small bowel and colon are unremarkable. Vascular/Lymphatic: No intra-abdominal vascular injury. Mild aortic atherosclerotic calcification. Normal aortic caliber. Reproductive: Uterus and bilateral adnexa are unremarkable. Other: No peritoneal blood or  free air. Musculoskeletal: Unchanged chronic mild anterior wedging of L1. Moderately severe lumbar degenerative disc changes at L2-3 and L3-4. No evidence of acute fracture. IMPRESSION: 1. Mild to moderate anterior compression of T12, probably subacute to chronic. Multiple old healed rib fracture deformities. No conclusive acute fracture. 2. No evidence of vascular injury in the chest, abdomen or pelvis. No evidence of parenchymal organ injury. No pneumothorax, hemothorax, peritoneal blood or free air. 3. Moderate aortic atherosclerosis. 4. Mild hepatic steatosis. Electronically Signed   By: Andreas Newport M.D.   On: 05/05/2017 04:34   Ct Cervical Spine Wo Contrast  Result Date: 05/05/2017 CLINICAL DATA:  Fall EXAM: CT HEAD WITHOUT CONTRAST CT CERVICAL SPINE WITHOUT CONTRAST TECHNIQUE: Multidetector CT imaging of the head and cervical spine was performed following the standard protocol without intravenous contrast. Multiplanar CT image reconstructions of the cervical spine were also generated. COMPARISON:  None. FINDINGS: CT HEAD FINDINGS Brain: No mass lesion, intraparenchymal hemorrhage or extra-axial collection. No evidence of acute cortical infarct. There is periventricular hypoattenuation compatible with chronic microvascular disease. Vascular: No hyperdense vessel or unexpected calcification. Skull: Normal visualized skull base, calvarium and extracranial soft tissues. Sinuses/Orbits: Postsurgical changes of the paranasal sinuses with mild-to-moderate mucosal thickening, worst in the anterior ethmoid air cells. No mastoid or middle ear effusion. Normal orbits. CT CERVICAL SPINE FINDINGS Alignment: No static subluxation. Facets are aligned. Occipital condyles are normally positioned. Skull base and vertebrae: There is a minimally displaced fracture of the anterior C1 ring. There is a posterior lucency that appears chronic, but could provide a secondary break in the C1 ring. There is a comminuted fracture  of the right C2 transverse process involving the transverse foramen. There is ACDF hardware at C4-C5 with fusion also at C5-C6. No other acute fractures identified. Moderate chronic height loss at C7. Soft tissues and spinal canal: Hyperattenuation surrounding the proximal cervical spinal cord, posterior to the dens may indicate epidural hematoma underlying the tectorial membrane. This markedly narrows the thecal sac. Disc levels: The left C4 and C5 facets are fused. There is multilevel severe facet arthrosis. Upper chest: No pneumothorax, pulmonary nodule or pleural effusion. Other: Normal visualized paraspinal cervical soft tissues. IMPRESSION: 1. Comminuted fracture through the right transverse foramen at C2. Dislocation may cause injury to the vertebral artery. CTA of the neck is recommended. 2. Minimally displaced fracture of the anterior C1 ring. 3. Hyperattenuating material posterior to the dens is concerning for epidural hematoma posteriorly displacing the tectorial membrane and narrowing the spinal canal. There may be a non-displaced fracture of the dens. This could be further assessed with MRI. 4. 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 Critical Value/emergent results were called by telephone at the time of interpretation on 05/05/2017 at 3:53 am to Dr. Thayer Jew , who verbally acknowledged these results. Electronically Signed   By: Ulyses Jarred M.D.   On: 05/05/2017 03:57   Mr Brain Wo Contrast  Result Date: 05/05/2017 CLINICAL DATA:  [address] and  driveway at 11 p.m. last night. Patient is diagnostic to the fall. She was drinking alcohol. Right-sided paralysis. Right C2 foramen fracture. Anterior C1 fracture. EXAM: MRI HEAD WITHOUT CONTRAST MRI CERVICAL SPINE WITHOUT CONTRAST TECHNIQUE: Multiplanar, multiecho pulse sequences of the brain and surrounding structures, and cervical spine, to include the craniocervical junction and cervicothoracic junction, were  obtained without intravenous contrast. COMPARISON:  CT of the head and cervical spine from the same day at 3:18 a.m. FINDINGS: MRI HEAD FINDINGS Brain: No acute infarct, hemorrhage, or mass lesion is present. Advanced atrophy and diffuse white matter disease is present. White matter changes extend into the central pons. The cerebellum is unremarkable. There is significant focal edema in the inferior brainstem associated with the fracture and hemorrhage at C1 and C2. Please see the cervical spine report comments below. Vascular: Flow is present in the major intracranial artery is. Skull and upper cervical spine: C1 anterior arch fracture and right transverse process fracture at C2 are again noted. There is abnormal signal of the tip of the dens suggesting fracture. Significant soft tissue surrounds the dens likely representing a component hemorrhage, worse on the left. This creates mass effect on the upper cervical spine at the cervicomedullary junction. No abnormal T2 signal is present in the lower brainstem. Soft tissue swelling edema is present within the parietal scalp bilaterally. This could be related to the acute injury. Sinuses/Orbits: Circumferential mucosal thickening is present throughout the residual paranasal sinuses. Bilateral maxillary antrostomies and partial ethmoidectomies are noted. There are no fluid levels. Fluid is present in the right mastoid air cells. Bilateral lens replacements are present. The globes and orbits are otherwise within normal limits. MRI CERVICAL SPINE FINDINGS Alignment: Anterior cervical fusion is evident at C4-5 and C5-6. Grade 1 degenerative anterolisthesis is present at C3-4 and at C7-T1. Vertebrae: Anterior arch C1 and tip of dense acute fractures are again noted. There is fluid and soft tissue surrounding the dens compatible with hemorrhage. A small portion of this may be related to chronic degenerative change. There is fluid, likely hemorrhage, at the C1-2 articulations  bilaterally, left more prominent than right. Cord: Abnormal signal is present in the lower medulla and upper spinal cord over 4 cm extending to the C2-3 level. There is no definite hemorrhage in the cord. Posterior Fossa, vertebral arteries, paraspinal tissues: Diffuse edema is present about the posterior elements C1-2 and within the suboccipital ligaments. There is prevertebral edema extending from the skullbase through C3-4. Disc levels: Severe central canal stenosis is present at the craniocervical junction. C2-3: Asymmetric left-sided facet hypertrophy and mild left foraminal narrowing is present. C3-4: Asymmetric left-sided facet hypertrophy is present. Mild central canal narrowing is present. Moderate foraminal narrowing is worse on the left. C4-5: Moderate foraminal narrowing is worse on the left. Left foraminotomy is present. Mild foraminal narrowing is present bilaterally. C5-6: Uncovertebral spurring leads to mild foraminal narrowing bilaterally, worse on the right. C6-7: Uncovertebral and facet spurring lead to moderate right foraminal stenosis. C7-T1: Facet hypertrophy is worse on the right. Mild foraminal narrowing is worse on the right. IMPRESSION: 1. Anterior C1 and tip of dens fracture. 2. Right C2 transverse process fracture. 3. Extensive soft tissue and fluid surrounding the dens and C1 fractures compatible with hematoma compressing the spinal cord at the craniocervical junction. 4. Edema within the lower brainstem and upper spinal cord spanning 4 cm and extending to the C2-3 level. 5. Prevertebral edema from the skullbase through C3-4. 6. Fluid and edema involving the lateral mass  and posterior elements at C1-2. 7. Diffuse suboccipital soft tissue edema likely reflecting ligamentous injury. 8. Advanced atrophy and diffuse white matter disease without other intracranial injury. 9. Posterior scalp soft tissue swelling bilaterally. 10. Anterior cervical fusion at C4-5 and C5-6. 11. Degenerative  anterolisthesis at C3-4 and C7-T1. These results were called by telephone at the time of interpretation on 05/05/2017 at 12:06 pm to Dr. Vanita Panda, who verbally acknowledged these results. Electronically Signed   By: San Morelle M.D.   On: 05/05/2017 12:27   Mr Cervical Spine Wo Contrast  Result Date: 05/05/2017 CLINICAL DATA:  [address] at 11 p.m. last night. Patient is diagnostic to the fall. She was drinking alcohol. Right-sided paralysis. Right C2 foramen fracture. Anterior C1 fracture. EXAM: MRI HEAD WITHOUT CONTRAST MRI CERVICAL SPINE WITHOUT CONTRAST TECHNIQUE: Multiplanar, multiecho pulse sequences of the brain and surrounding structures, and cervical spine, to include the craniocervical junction and cervicothoracic junction, were obtained without intravenous contrast. COMPARISON:  CT of the head and cervical spine from the same day at 3:18 a.m. FINDINGS: MRI HEAD FINDINGS Brain: No acute infarct, hemorrhage, or mass lesion is present. Advanced atrophy and diffuse white matter disease is present. White matter changes extend into the central pons. The cerebellum is unremarkable. There is significant focal edema in the inferior brainstem associated with the fracture and hemorrhage at C1 and C2. Please see the cervical spine report comments below. Vascular: Flow is present in the major intracranial artery is. Skull and upper cervical spine: C1 anterior arch fracture and right transverse process fracture at C2 are again noted. There is abnormal signal of the tip of the dens suggesting fracture. Significant soft tissue surrounds the dens likely representing a component hemorrhage, worse on the left. This creates mass effect on the upper cervical spine at the cervicomedullary junction. No abnormal T2 signal is present in the lower brainstem. Soft tissue swelling edema is present within the parietal scalp bilaterally. This could be related to the acute injury. Sinuses/Orbits: Circumferential  mucosal thickening is present throughout the residual paranasal sinuses. Bilateral maxillary antrostomies and partial ethmoidectomies are noted. There are no fluid levels. Fluid is present in the right mastoid air cells. Bilateral lens replacements are present. The globes and orbits are otherwise within normal limits. MRI CERVICAL SPINE FINDINGS Alignment: Anterior cervical fusion is evident at C4-5 and C5-6. Grade 1 degenerative anterolisthesis is present at C3-4 and at C7-T1. Vertebrae: Anterior arch C1 and tip of dense acute fractures are again noted. There is fluid and soft tissue surrounding the dens compatible with hemorrhage. A small portion of this may be related to chronic degenerative change. There is fluid, likely hemorrhage, at the C1-2 articulations bilaterally, left more prominent than right. Cord: Abnormal signal is present in the lower medulla and upper spinal cord over 4 cm extending to the C2-3 level. There is no definite hemorrhage in the cord. Posterior Fossa, vertebral arteries, paraspinal tissues: Diffuse edema is present about the posterior elements C1-2 and within the suboccipital ligaments. There is prevertebral edema extending from the skullbase through C3-4. Disc levels: Severe central canal stenosis is present at the craniocervical junction. C2-3: Asymmetric left-sided facet hypertrophy and mild left foraminal narrowing is present. C3-4: Asymmetric left-sided facet hypertrophy is present. Mild central canal narrowing is present. Moderate foraminal narrowing is worse on the left. C4-5: Moderate foraminal narrowing is worse on the left. Left foraminotomy is present. Mild foraminal narrowing is present bilaterally. C5-6: Uncovertebral spurring leads to mild foraminal narrowing bilaterally, worse  on the right. C6-7: Uncovertebral and facet spurring lead to moderate right foraminal stenosis. C7-T1: Facet hypertrophy is worse on the right. Mild foraminal narrowing is worse on the right.  IMPRESSION: 1. Anterior C1 and tip of dens fracture. 2. Right C2 transverse process fracture. 3. Extensive soft tissue and fluid surrounding the dens and C1 fractures compatible with hematoma compressing the spinal cord at the craniocervical junction. 4. Edema within the lower brainstem and upper spinal cord spanning 4 cm and extending to the C2-3 level. 5. Prevertebral edema from the skullbase through C3-4. 6. Fluid and edema involving the lateral mass and posterior elements at C1-2. 7. Diffuse suboccipital soft tissue edema likely reflecting ligamentous injury. 8. Advanced atrophy and diffuse white matter disease without other intracranial injury. 9. Posterior scalp soft tissue swelling bilaterally. 10. Anterior cervical fusion at C4-5 and C5-6. 11. Degenerative anterolisthesis at C3-4 and C7-T1. These results were called by telephone at the time of interpretation on 05/05/2017 at 12:06 pm to Dr. Vanita Panda, who verbally acknowledged these results. Electronically Signed   By: San Morelle M.D.   On: 05/05/2017 12:27   Ct Abdomen Pelvis W Contrast  Result Date: 05/05/2017 CLINICAL DATA:  [address] outside around 23:00.  Found at 02:30 a.m. EXAM: CT CHEST, ABDOMEN, AND PELVIS WITH CONTRAST TECHNIQUE: Multidetector CT imaging of the chest, abdomen and pelvis was performed following the standard protocol during bolus administration of intravenous contrast. CONTRAST:  64m ISOVUE-300 IOPAMIDOL (ISOVUE-300) INJECTION 61% COMPARISON:  12/07/2015, 10/17/2014. FINDINGS: CT CHEST FINDINGS Cardiovascular: No intrathoracic vascular injury. Moderate aortic atherosclerotic calcification. Normal heart size. No pericardial effusion. Mediastinum/Nodes: No enlarged mediastinal, hilar, or axillary lymph nodes. Thyroid gland, trachea, and esophagus demonstrate no significant findings. No mediastinal hematoma. Lungs/Pleura: No pneumothorax. No pleural effusion. Stable apical scarring without significant change from 01/25/2004.  Airways are patent and normal in caliber. Musculoskeletal: Multiple old healed rib fracture deformities. Mild to moderate anterior wedging of T12, new from 10/17/2014 and probably new from 12/07/2015. This most likely is subacute to chronic, given the degree of reactive sclerosis. Pedicles and facet articulations are intact. The other thoracic vertebrae are normal in height. Sternum is intact. CT ABDOMEN PELVIS FINDINGS Hepatobiliary: Mild hepatic fatty infiltration without focal lesion. Cholecystectomy. Normal bile ducts. Pancreas: Unremarkable. No pancreatic ductal dilatation or surrounding inflammatory changes. Spleen: No splenic injury or perisplenic hematoma. Adrenals/Urinary Tract: No adrenal hemorrhage or renal injury identified. Bladder is unremarkable. Stomach/Bowel: Stomach, small bowel and colon are unremarkable. Vascular/Lymphatic: No intra-abdominal vascular injury. Mild aortic atherosclerotic calcification. Normal aortic caliber. Reproductive: Uterus and bilateral adnexa are unremarkable. Other: No peritoneal blood or free air. Musculoskeletal: Unchanged chronic mild anterior wedging of L1. Moderately severe lumbar degenerative disc changes at L2-3 and L3-4. No evidence of acute fracture. IMPRESSION: 1. Mild to moderate anterior compression of T12, probably subacute to chronic. Multiple old healed rib fracture deformities. No conclusive acute fracture. 2. No evidence of vascular injury in the chest, abdomen or pelvis. No evidence of parenchymal organ injury. No pneumothorax, hemothorax, peritoneal blood or free air. 3. Moderate aortic atherosclerosis. 4. Mild hepatic steatosis. Electronically Signed   By: DAndreas NewportM.D.   On: 05/05/2017 04:34   Dg Chest Port 1 View  Result Date: 05/05/2017 CLINICAL DATA:  Fall, history of asthma EXAM: PORTABLE CHEST 1 VIEW COMPARISON:  12/07/2015 FINDINGS: Postsurgical changes of the cervical spine. Mild hyperinflation. No acute consolidation or effusion.  Normal heart size. Aortic atherosclerosis. No pneumothorax. Multiple old appearing right rib fractures. Possible acute left  sixth rib fracture IMPRESSION: 1. Negative for pneumothorax or pleural effusion 2. Possible acute left sixth rib fracture Electronically Signed   By: Donavan Foil M.D.   On: 05/05/2017 04:24   Dg Shoulder Right Portable  Result Date: 05/05/2017 CLINICAL DATA:  Fall with neck injury EXAM: PORTABLE RIGHT SHOULDER COMPARISON:  Radiograph 05/05/2017 FINDINGS: AC joint is intact. No definite acute displaced fracture or dislocation on single view. Old right rib fractures IMPRESSION: No definite acute osseous abnormality Electronically Signed   By: Donavan Foil M.D.   On: 05/05/2017 04:27   Dg Knee Right Port  Result Date: 05/05/2017 CLINICAL DATA:  Fall with pain EXAM: PORTABLE RIGHT KNEE - 1-2 VIEW COMPARISON:  04/10/2015 FINDINGS: Mild lateral deviation of the patella moderate patellofemoral degenerative changes. Possible joint space calcifications superiorly. Marked arthritis of the lateral compartment with mild arthritis of the medial compartment. No acute fracture is seen IMPRESSION: 1. Minimal lateral deviation of patella 2. Moderate severe arthritis of the knee.  No acute fracture is seen 3. Possible superior joint space calcifications Electronically Signed   By: Donavan Foil M.D.   On: 05/05/2017 04:26    Impression/Plan   71 y.o. female with cervical spine injury with fractures of C1 and 2. The most significant finding is an epidural hematoma from skullbase to C3-4 with significant spinal compression. Right sided paralysis. I have reviewed the case with Dr Cyndy Freeze who has also reviewed the imaging. Patient likely needs a trans-oral trans-odontoid exposure for evacuation of hematoma. Unfortunately, we do not have the capabilities of performing there procedure here at [facility]. Dr Cyndy Freeze has discussed the case with Dr Rogers Blocker (neurosurgery [facility]) who has agreed to accept the  patient. EDP notified.

## 2017-05-05 NOTE — ED Notes (Signed)
Mouth swabbed with wet sponge.  Pt was c/o dry mouth.
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
## 2017-05-05 NOTE — ED Notes (Signed)
Neuro PA at bedside.  
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
## 2017-05-05 NOTE — ED Provider Notes (Addendum)
On return from MRI I discussed the patient with Dr. Christella Noa.  Per chart he was the consulting physician and aware of the patient after [facility] initial presentation [facility].  Patient continues to have R UE / LE deficits.   Carmin Muskrat, MD 05/05/17 1231 2:53 PM Our neurosurgery team has arranged transfer to [facility] (Dr. Rogers Blocker).  Husband is aware of plans.  Patient remains in similar condition.     Carmin Muskrat, MD 05/05/17 1455

## 2017-06-02 DIAGNOSIS — K592 Neurogenic bowel, not elsewhere classified: Secondary | ICD-10-CM | POA: Insufficient documentation

## 2017-06-02 DIAGNOSIS — R29818 Other symptoms and signs involving the nervous system: Secondary | ICD-10-CM | POA: Insufficient documentation

## 2017-06-02 DIAGNOSIS — Z7409 Other reduced mobility: Secondary | ICD-10-CM | POA: Insufficient documentation

## 2017-06-02 DIAGNOSIS — F32A Depression, unspecified: Secondary | ICD-10-CM | POA: Insufficient documentation

## 2017-06-02 DIAGNOSIS — R1310 Dysphagia, unspecified: Secondary | ICD-10-CM | POA: Insufficient documentation

## 2017-06-02 DIAGNOSIS — R4189 Other symptoms and signs involving cognitive functions and awareness: Secondary | ICD-10-CM | POA: Insufficient documentation

## 2017-06-02 DIAGNOSIS — N319 Neuromuscular dysfunction of bladder, unspecified: Secondary | ICD-10-CM | POA: Insufficient documentation

## 2017-06-05 ENCOUNTER — Ambulatory Visit: Payer: Medicare Other | Admitting: Gastroenterology

## 2017-07-09 DIAGNOSIS — R197 Diarrhea, unspecified: Secondary | ICD-10-CM | POA: Diagnosis not present

## 2017-07-09 DIAGNOSIS — J449 Chronic obstructive pulmonary disease, unspecified: Secondary | ICD-10-CM | POA: Diagnosis not present

## 2017-07-09 DIAGNOSIS — S12000S Unspecified displaced fracture of first cervical vertebra, sequela: Secondary | ICD-10-CM | POA: Diagnosis not present

## 2017-07-09 DIAGNOSIS — K592 Neurogenic bowel, not elsewhere classified: Secondary | ICD-10-CM | POA: Diagnosis not present

## 2017-07-09 DIAGNOSIS — N189 Chronic kidney disease, unspecified: Secondary | ICD-10-CM | POA: Diagnosis not present

## 2017-07-09 DIAGNOSIS — I10 Essential (primary) hypertension: Secondary | ICD-10-CM | POA: Diagnosis not present

## 2017-07-09 DIAGNOSIS — R0602 Shortness of breath: Secondary | ICD-10-CM | POA: Diagnosis not present

## 2017-07-09 DIAGNOSIS — R52 Pain, unspecified: Secondary | ICD-10-CM | POA: Diagnosis not present

## 2017-07-09 DIAGNOSIS — A0472 Enterocolitis due to Clostridium difficile, not specified as recurrent: Secondary | ICD-10-CM | POA: Diagnosis not present

## 2017-07-09 DIAGNOSIS — M6281 Muscle weakness (generalized): Secondary | ICD-10-CM | POA: Diagnosis not present

## 2017-07-09 DIAGNOSIS — D649 Anemia, unspecified: Secondary | ICD-10-CM | POA: Diagnosis not present

## 2017-07-09 DIAGNOSIS — S12100S Unspecified displaced fracture of second cervical vertebra, sequela: Secondary | ICD-10-CM | POA: Diagnosis not present

## 2017-07-09 DIAGNOSIS — R1312 Dysphagia, oropharyngeal phase: Secondary | ICD-10-CM | POA: Diagnosis not present

## 2017-07-18 DIAGNOSIS — D649 Anemia, unspecified: Secondary | ICD-10-CM | POA: Diagnosis not present

## 2017-07-18 DIAGNOSIS — R0602 Shortness of breath: Secondary | ICD-10-CM | POA: Diagnosis not present

## 2017-07-21 DIAGNOSIS — K592 Neurogenic bowel, not elsewhere classified: Secondary | ICD-10-CM | POA: Diagnosis not present

## 2017-07-22 DIAGNOSIS — R197 Diarrhea, unspecified: Secondary | ICD-10-CM | POA: Diagnosis not present

## 2017-07-27 DIAGNOSIS — R197 Diarrhea, unspecified: Secondary | ICD-10-CM | POA: Diagnosis not present

## 2017-08-04 DIAGNOSIS — J449 Chronic obstructive pulmonary disease, unspecified: Secondary | ICD-10-CM | POA: Diagnosis not present

## 2017-08-04 DIAGNOSIS — R52 Pain, unspecified: Secondary | ICD-10-CM | POA: Diagnosis not present

## 2017-08-21 DIAGNOSIS — S12100S Unspecified displaced fracture of second cervical vertebra, sequela: Secondary | ICD-10-CM | POA: Diagnosis not present

## 2017-08-21 DIAGNOSIS — M6281 Muscle weakness (generalized): Secondary | ICD-10-CM | POA: Diagnosis not present

## 2017-08-21 DIAGNOSIS — R1312 Dysphagia, oropharyngeal phase: Secondary | ICD-10-CM | POA: Diagnosis not present

## 2017-08-21 DIAGNOSIS — S12000S Unspecified displaced fracture of first cervical vertebra, sequela: Secondary | ICD-10-CM | POA: Diagnosis not present

## 2017-08-24 DIAGNOSIS — S12000S Unspecified displaced fracture of first cervical vertebra, sequela: Secondary | ICD-10-CM | POA: Diagnosis not present

## 2017-08-24 DIAGNOSIS — R1312 Dysphagia, oropharyngeal phase: Secondary | ICD-10-CM | POA: Diagnosis not present

## 2017-08-24 DIAGNOSIS — S12100S Unspecified displaced fracture of second cervical vertebra, sequela: Secondary | ICD-10-CM | POA: Diagnosis not present

## 2017-08-24 DIAGNOSIS — M6281 Muscle weakness (generalized): Secondary | ICD-10-CM | POA: Diagnosis not present

## 2017-08-25 DIAGNOSIS — R1312 Dysphagia, oropharyngeal phase: Secondary | ICD-10-CM | POA: Diagnosis not present

## 2017-08-25 DIAGNOSIS — S12000S Unspecified displaced fracture of first cervical vertebra, sequela: Secondary | ICD-10-CM | POA: Diagnosis not present

## 2017-08-25 DIAGNOSIS — M6281 Muscle weakness (generalized): Secondary | ICD-10-CM | POA: Diagnosis not present

## 2017-08-25 DIAGNOSIS — S12100S Unspecified displaced fracture of second cervical vertebra, sequela: Secondary | ICD-10-CM | POA: Diagnosis not present

## 2017-08-26 DIAGNOSIS — M6281 Muscle weakness (generalized): Secondary | ICD-10-CM | POA: Diagnosis not present

## 2017-08-26 DIAGNOSIS — N183 Chronic kidney disease, stage 3 (moderate): Secondary | ICD-10-CM | POA: Diagnosis not present

## 2017-08-26 DIAGNOSIS — R197 Diarrhea, unspecified: Secondary | ICD-10-CM | POA: Diagnosis not present

## 2017-08-26 DIAGNOSIS — J449 Chronic obstructive pulmonary disease, unspecified: Secondary | ICD-10-CM | POA: Diagnosis not present

## 2017-08-26 DIAGNOSIS — S12000S Unspecified displaced fracture of first cervical vertebra, sequela: Secondary | ICD-10-CM | POA: Diagnosis not present

## 2017-08-26 DIAGNOSIS — I10 Essential (primary) hypertension: Secondary | ICD-10-CM | POA: Diagnosis not present

## 2017-08-26 DIAGNOSIS — S12100S Unspecified displaced fracture of second cervical vertebra, sequela: Secondary | ICD-10-CM | POA: Diagnosis not present

## 2017-08-26 DIAGNOSIS — R1312 Dysphagia, oropharyngeal phase: Secondary | ICD-10-CM | POA: Diagnosis not present

## 2017-08-27 DIAGNOSIS — M4312 Spondylolisthesis, cervical region: Secondary | ICD-10-CM | POA: Diagnosis not present

## 2017-08-27 DIAGNOSIS — S12000S Unspecified displaced fracture of first cervical vertebra, sequela: Secondary | ICD-10-CM | POA: Diagnosis not present

## 2017-08-27 DIAGNOSIS — R1312 Dysphagia, oropharyngeal phase: Secondary | ICD-10-CM | POA: Diagnosis not present

## 2017-08-27 DIAGNOSIS — M6281 Muscle weakness (generalized): Secondary | ICD-10-CM | POA: Diagnosis not present

## 2017-08-27 DIAGNOSIS — Z981 Arthrodesis status: Secondary | ICD-10-CM | POA: Diagnosis not present

## 2017-08-27 DIAGNOSIS — S12100S Unspecified displaced fracture of second cervical vertebra, sequela: Secondary | ICD-10-CM | POA: Diagnosis not present

## 2017-08-27 DIAGNOSIS — M50323 Other cervical disc degeneration at C6-C7 level: Secondary | ICD-10-CM | POA: Diagnosis not present

## 2017-08-28 DIAGNOSIS — S12100S Unspecified displaced fracture of second cervical vertebra, sequela: Secondary | ICD-10-CM | POA: Diagnosis not present

## 2017-08-28 DIAGNOSIS — S12000S Unspecified displaced fracture of first cervical vertebra, sequela: Secondary | ICD-10-CM | POA: Diagnosis not present

## 2017-08-28 DIAGNOSIS — M6281 Muscle weakness (generalized): Secondary | ICD-10-CM | POA: Diagnosis not present

## 2017-08-28 DIAGNOSIS — R1312 Dysphagia, oropharyngeal phase: Secondary | ICD-10-CM | POA: Diagnosis not present

## 2017-08-30 DIAGNOSIS — S12000S Unspecified displaced fracture of first cervical vertebra, sequela: Secondary | ICD-10-CM | POA: Diagnosis not present

## 2017-08-30 DIAGNOSIS — M6281 Muscle weakness (generalized): Secondary | ICD-10-CM | POA: Diagnosis not present

## 2017-08-30 DIAGNOSIS — S12100S Unspecified displaced fracture of second cervical vertebra, sequela: Secondary | ICD-10-CM | POA: Diagnosis not present

## 2017-08-30 DIAGNOSIS — R1312 Dysphagia, oropharyngeal phase: Secondary | ICD-10-CM | POA: Diagnosis not present

## 2017-08-31 DIAGNOSIS — S12100S Unspecified displaced fracture of second cervical vertebra, sequela: Secondary | ICD-10-CM | POA: Diagnosis not present

## 2017-08-31 DIAGNOSIS — R1312 Dysphagia, oropharyngeal phase: Secondary | ICD-10-CM | POA: Diagnosis not present

## 2017-08-31 DIAGNOSIS — S12000S Unspecified displaced fracture of first cervical vertebra, sequela: Secondary | ICD-10-CM | POA: Diagnosis not present

## 2017-08-31 DIAGNOSIS — M6281 Muscle weakness (generalized): Secondary | ICD-10-CM | POA: Diagnosis not present

## 2017-09-02 DIAGNOSIS — M6281 Muscle weakness (generalized): Secondary | ICD-10-CM | POA: Diagnosis not present

## 2017-09-02 DIAGNOSIS — S12100S Unspecified displaced fracture of second cervical vertebra, sequela: Secondary | ICD-10-CM | POA: Diagnosis not present

## 2017-09-02 DIAGNOSIS — R1312 Dysphagia, oropharyngeal phase: Secondary | ICD-10-CM | POA: Diagnosis not present

## 2017-09-02 DIAGNOSIS — S12000S Unspecified displaced fracture of first cervical vertebra, sequela: Secondary | ICD-10-CM | POA: Diagnosis not present

## 2017-09-03 DIAGNOSIS — M6281 Muscle weakness (generalized): Secondary | ICD-10-CM | POA: Diagnosis not present

## 2017-09-03 DIAGNOSIS — S12000S Unspecified displaced fracture of first cervical vertebra, sequela: Secondary | ICD-10-CM | POA: Diagnosis not present

## 2017-09-03 DIAGNOSIS — S12100S Unspecified displaced fracture of second cervical vertebra, sequela: Secondary | ICD-10-CM | POA: Diagnosis not present

## 2017-09-03 DIAGNOSIS — R1312 Dysphagia, oropharyngeal phase: Secondary | ICD-10-CM | POA: Diagnosis not present

## 2017-09-04 DIAGNOSIS — S12000S Unspecified displaced fracture of first cervical vertebra, sequela: Secondary | ICD-10-CM | POA: Diagnosis not present

## 2017-09-04 DIAGNOSIS — M6281 Muscle weakness (generalized): Secondary | ICD-10-CM | POA: Diagnosis not present

## 2017-09-04 DIAGNOSIS — R1312 Dysphagia, oropharyngeal phase: Secondary | ICD-10-CM | POA: Diagnosis not present

## 2017-09-04 DIAGNOSIS — S12100S Unspecified displaced fracture of second cervical vertebra, sequela: Secondary | ICD-10-CM | POA: Diagnosis not present

## 2017-09-07 DIAGNOSIS — R1312 Dysphagia, oropharyngeal phase: Secondary | ICD-10-CM | POA: Diagnosis not present

## 2017-09-07 DIAGNOSIS — S12000S Unspecified displaced fracture of first cervical vertebra, sequela: Secondary | ICD-10-CM | POA: Diagnosis not present

## 2017-09-07 DIAGNOSIS — M6281 Muscle weakness (generalized): Secondary | ICD-10-CM | POA: Diagnosis not present

## 2017-09-07 DIAGNOSIS — S12100S Unspecified displaced fracture of second cervical vertebra, sequela: Secondary | ICD-10-CM | POA: Diagnosis not present

## 2017-09-08 DIAGNOSIS — R1312 Dysphagia, oropharyngeal phase: Secondary | ICD-10-CM | POA: Diagnosis not present

## 2017-09-08 DIAGNOSIS — M6281 Muscle weakness (generalized): Secondary | ICD-10-CM | POA: Diagnosis not present

## 2017-09-08 DIAGNOSIS — S12000S Unspecified displaced fracture of first cervical vertebra, sequela: Secondary | ICD-10-CM | POA: Diagnosis not present

## 2017-09-08 DIAGNOSIS — S12100S Unspecified displaced fracture of second cervical vertebra, sequela: Secondary | ICD-10-CM | POA: Diagnosis not present

## 2017-09-09 DIAGNOSIS — R197 Diarrhea, unspecified: Secondary | ICD-10-CM | POA: Diagnosis not present

## 2017-09-09 DIAGNOSIS — M6281 Muscle weakness (generalized): Secondary | ICD-10-CM | POA: Diagnosis not present

## 2017-09-09 DIAGNOSIS — S12000S Unspecified displaced fracture of first cervical vertebra, sequela: Secondary | ICD-10-CM | POA: Diagnosis not present

## 2017-09-09 DIAGNOSIS — R1312 Dysphagia, oropharyngeal phase: Secondary | ICD-10-CM | POA: Diagnosis not present

## 2017-09-09 DIAGNOSIS — S12100S Unspecified displaced fracture of second cervical vertebra, sequela: Secondary | ICD-10-CM | POA: Diagnosis not present

## 2017-09-10 DIAGNOSIS — R1312 Dysphagia, oropharyngeal phase: Secondary | ICD-10-CM | POA: Diagnosis not present

## 2017-09-10 DIAGNOSIS — M6281 Muscle weakness (generalized): Secondary | ICD-10-CM | POA: Diagnosis not present

## 2017-09-10 DIAGNOSIS — S12100S Unspecified displaced fracture of second cervical vertebra, sequela: Secondary | ICD-10-CM | POA: Diagnosis not present

## 2017-09-10 DIAGNOSIS — S12000S Unspecified displaced fracture of first cervical vertebra, sequela: Secondary | ICD-10-CM | POA: Diagnosis not present

## 2017-09-11 ENCOUNTER — Encounter: Payer: Self-pay | Admitting: Orthopedic Surgery

## 2017-09-11 ENCOUNTER — Ambulatory Visit (INDEPENDENT_AMBULATORY_CARE_PROVIDER_SITE_OTHER): Payer: Medicare Other | Admitting: Orthopedic Surgery

## 2017-09-11 VITALS — BP 160/99 | HR 75 | Ht 63.0 in | Wt 102.0 lb

## 2017-09-11 DIAGNOSIS — M25561 Pain in right knee: Secondary | ICD-10-CM | POA: Diagnosis not present

## 2017-09-11 DIAGNOSIS — M1711 Unilateral primary osteoarthritis, right knee: Secondary | ICD-10-CM

## 2017-09-11 DIAGNOSIS — G8929 Other chronic pain: Secondary | ICD-10-CM | POA: Diagnosis not present

## 2017-09-11 DIAGNOSIS — S12000S Unspecified displaced fracture of first cervical vertebra, sequela: Secondary | ICD-10-CM | POA: Diagnosis not present

## 2017-09-11 DIAGNOSIS — S12100S Unspecified displaced fracture of second cervical vertebra, sequela: Secondary | ICD-10-CM | POA: Diagnosis not present

## 2017-09-11 DIAGNOSIS — M6281 Muscle weakness (generalized): Secondary | ICD-10-CM | POA: Diagnosis not present

## 2017-09-11 DIAGNOSIS — R1312 Dysphagia, oropharyngeal phase: Secondary | ICD-10-CM | POA: Diagnosis not present

## 2017-09-11 NOTE — Addendum Note (Signed)
Addended byCandice Camp on: 09/11/2017 09:46 AM   Modules accepted: Orders

## 2017-09-11 NOTE — Progress Notes (Signed)
Chief Complaint  Patient presents with  . Knee Pain    right, can not WB      72 year old female seen by Jordan [name] previously recommended to have a right total knee she declined at the time then fractured her cervical spine she is status post C1-C2 fusion presents now with difficulty walking wants an injection in the right knee and to discuss possible knee replacement  Exam reveals that she has intact motor function in her right lower extremity has a severe flexion contracture of about 20-5 degrees  We gave her the injection and I will make the referral for her to have knee replacement at [facility] if possible.  Procedure note right knee injection verbal consent was obtained to inject right knee joint  Timeout was completed to confirm the site of injection  The medications used were 40 mg of Depo-Medrol and 1% lidocaine 3 cc  Anesthesia was provided by ethyl chloride and the skin was prepped with alcohol.  After cleaning the skin with alcohol a 20-gauge needle was used to inject the right knee joint. There were no complications. A sterile bandage was applied.

## 2017-09-11 NOTE — Patient Instructions (Signed)

## 2017-09-12 DIAGNOSIS — M6281 Muscle weakness (generalized): Secondary | ICD-10-CM | POA: Diagnosis not present

## 2017-09-12 DIAGNOSIS — R1312 Dysphagia, oropharyngeal phase: Secondary | ICD-10-CM | POA: Diagnosis not present

## 2017-09-12 DIAGNOSIS — S12100S Unspecified displaced fracture of second cervical vertebra, sequela: Secondary | ICD-10-CM | POA: Diagnosis not present

## 2017-09-12 DIAGNOSIS — S12000S Unspecified displaced fracture of first cervical vertebra, sequela: Secondary | ICD-10-CM | POA: Diagnosis not present

## 2017-09-14 DIAGNOSIS — S12100S Unspecified displaced fracture of second cervical vertebra, sequela: Secondary | ICD-10-CM | POA: Diagnosis not present

## 2017-09-14 DIAGNOSIS — M6281 Muscle weakness (generalized): Secondary | ICD-10-CM | POA: Diagnosis not present

## 2017-09-14 DIAGNOSIS — R197 Diarrhea, unspecified: Secondary | ICD-10-CM | POA: Diagnosis not present

## 2017-09-14 DIAGNOSIS — S12000S Unspecified displaced fracture of first cervical vertebra, sequela: Secondary | ICD-10-CM | POA: Diagnosis not present

## 2017-09-14 DIAGNOSIS — R1312 Dysphagia, oropharyngeal phase: Secondary | ICD-10-CM | POA: Diagnosis not present

## 2017-09-15 DIAGNOSIS — M6281 Muscle weakness (generalized): Secondary | ICD-10-CM | POA: Diagnosis not present

## 2017-09-15 DIAGNOSIS — R1312 Dysphagia, oropharyngeal phase: Secondary | ICD-10-CM | POA: Diagnosis not present

## 2017-09-15 DIAGNOSIS — S12000S Unspecified displaced fracture of first cervical vertebra, sequela: Secondary | ICD-10-CM | POA: Diagnosis not present

## 2017-09-15 DIAGNOSIS — S12100S Unspecified displaced fracture of second cervical vertebra, sequela: Secondary | ICD-10-CM | POA: Diagnosis not present

## 2017-09-16 DIAGNOSIS — M6281 Muscle weakness (generalized): Secondary | ICD-10-CM | POA: Diagnosis not present

## 2017-09-16 DIAGNOSIS — R1312 Dysphagia, oropharyngeal phase: Secondary | ICD-10-CM | POA: Diagnosis not present

## 2017-09-16 DIAGNOSIS — S12100S Unspecified displaced fracture of second cervical vertebra, sequela: Secondary | ICD-10-CM | POA: Diagnosis not present

## 2017-09-16 DIAGNOSIS — S12000S Unspecified displaced fracture of first cervical vertebra, sequela: Secondary | ICD-10-CM | POA: Diagnosis not present

## 2017-09-17 DIAGNOSIS — S12100S Unspecified displaced fracture of second cervical vertebra, sequela: Secondary | ICD-10-CM | POA: Diagnosis not present

## 2017-09-17 DIAGNOSIS — M6281 Muscle weakness (generalized): Secondary | ICD-10-CM | POA: Diagnosis not present

## 2017-09-17 DIAGNOSIS — R1312 Dysphagia, oropharyngeal phase: Secondary | ICD-10-CM | POA: Diagnosis not present

## 2017-09-17 DIAGNOSIS — S12000S Unspecified displaced fracture of first cervical vertebra, sequela: Secondary | ICD-10-CM | POA: Diagnosis not present

## 2017-09-18 DIAGNOSIS — S12000S Unspecified displaced fracture of first cervical vertebra, sequela: Secondary | ICD-10-CM | POA: Diagnosis not present

## 2017-09-18 DIAGNOSIS — M6281 Muscle weakness (generalized): Secondary | ICD-10-CM | POA: Diagnosis not present

## 2017-09-18 DIAGNOSIS — R1312 Dysphagia, oropharyngeal phase: Secondary | ICD-10-CM | POA: Diagnosis not present

## 2017-09-18 DIAGNOSIS — S12100S Unspecified displaced fracture of second cervical vertebra, sequela: Secondary | ICD-10-CM | POA: Diagnosis not present

## 2017-09-21 DIAGNOSIS — S12000S Unspecified displaced fracture of first cervical vertebra, sequela: Secondary | ICD-10-CM | POA: Diagnosis not present

## 2017-09-21 DIAGNOSIS — M6281 Muscle weakness (generalized): Secondary | ICD-10-CM | POA: Diagnosis not present

## 2017-09-21 DIAGNOSIS — S12100S Unspecified displaced fracture of second cervical vertebra, sequela: Secondary | ICD-10-CM | POA: Diagnosis not present

## 2017-09-21 DIAGNOSIS — R1312 Dysphagia, oropharyngeal phase: Secondary | ICD-10-CM | POA: Diagnosis not present

## 2017-09-22 DIAGNOSIS — M6281 Muscle weakness (generalized): Secondary | ICD-10-CM | POA: Diagnosis not present

## 2017-09-22 DIAGNOSIS — S12100S Unspecified displaced fracture of second cervical vertebra, sequela: Secondary | ICD-10-CM | POA: Diagnosis not present

## 2017-09-22 DIAGNOSIS — R1312 Dysphagia, oropharyngeal phase: Secondary | ICD-10-CM | POA: Diagnosis not present

## 2017-09-22 DIAGNOSIS — S12000S Unspecified displaced fracture of first cervical vertebra, sequela: Secondary | ICD-10-CM | POA: Diagnosis not present

## 2017-09-22 DIAGNOSIS — J449 Chronic obstructive pulmonary disease, unspecified: Secondary | ICD-10-CM | POA: Diagnosis not present

## 2017-09-22 DIAGNOSIS — R52 Pain, unspecified: Secondary | ICD-10-CM | POA: Diagnosis not present

## 2017-09-23 DIAGNOSIS — R1312 Dysphagia, oropharyngeal phase: Secondary | ICD-10-CM | POA: Diagnosis not present

## 2017-09-23 DIAGNOSIS — M6281 Muscle weakness (generalized): Secondary | ICD-10-CM | POA: Diagnosis not present

## 2017-09-23 DIAGNOSIS — S12100S Unspecified displaced fracture of second cervical vertebra, sequela: Secondary | ICD-10-CM | POA: Diagnosis not present

## 2017-09-23 DIAGNOSIS — S12000S Unspecified displaced fracture of first cervical vertebra, sequela: Secondary | ICD-10-CM | POA: Diagnosis not present

## 2017-09-24 DIAGNOSIS — M6281 Muscle weakness (generalized): Secondary | ICD-10-CM | POA: Diagnosis not present

## 2017-09-24 DIAGNOSIS — R1312 Dysphagia, oropharyngeal phase: Secondary | ICD-10-CM | POA: Diagnosis not present

## 2017-09-24 DIAGNOSIS — S12100S Unspecified displaced fracture of second cervical vertebra, sequela: Secondary | ICD-10-CM | POA: Diagnosis not present

## 2017-09-24 DIAGNOSIS — S12000S Unspecified displaced fracture of first cervical vertebra, sequela: Secondary | ICD-10-CM | POA: Diagnosis not present

## 2017-09-25 DIAGNOSIS — S12100S Unspecified displaced fracture of second cervical vertebra, sequela: Secondary | ICD-10-CM | POA: Diagnosis not present

## 2017-09-25 DIAGNOSIS — S14109S Unspecified injury at unspecified level of cervical spinal cord, sequela: Secondary | ICD-10-CM | POA: Diagnosis not present

## 2017-09-25 DIAGNOSIS — S12000S Unspecified displaced fracture of first cervical vertebra, sequela: Secondary | ICD-10-CM | POA: Diagnosis not present

## 2017-09-25 DIAGNOSIS — R262 Difficulty in walking, not elsewhere classified: Secondary | ICD-10-CM | POA: Diagnosis not present

## 2017-09-25 DIAGNOSIS — R1312 Dysphagia, oropharyngeal phase: Secondary | ICD-10-CM | POA: Diagnosis not present

## 2017-09-25 DIAGNOSIS — M6281 Muscle weakness (generalized): Secondary | ICD-10-CM | POA: Diagnosis not present

## 2017-09-28 DIAGNOSIS — R197 Diarrhea, unspecified: Secondary | ICD-10-CM | POA: Diagnosis not present

## 2017-09-28 DIAGNOSIS — M6281 Muscle weakness (generalized): Secondary | ICD-10-CM | POA: Diagnosis not present

## 2017-09-28 DIAGNOSIS — S12100S Unspecified displaced fracture of second cervical vertebra, sequela: Secondary | ICD-10-CM | POA: Diagnosis not present

## 2017-09-28 DIAGNOSIS — R1312 Dysphagia, oropharyngeal phase: Secondary | ICD-10-CM | POA: Diagnosis not present

## 2017-09-28 DIAGNOSIS — S12000S Unspecified displaced fracture of first cervical vertebra, sequela: Secondary | ICD-10-CM | POA: Diagnosis not present

## 2017-09-28 DIAGNOSIS — J449 Chronic obstructive pulmonary disease, unspecified: Secondary | ICD-10-CM | POA: Diagnosis not present

## 2017-09-29 DIAGNOSIS — S12100S Unspecified displaced fracture of second cervical vertebra, sequela: Secondary | ICD-10-CM | POA: Diagnosis not present

## 2017-09-29 DIAGNOSIS — S12000S Unspecified displaced fracture of first cervical vertebra, sequela: Secondary | ICD-10-CM | POA: Diagnosis not present

## 2017-09-29 DIAGNOSIS — M6281 Muscle weakness (generalized): Secondary | ICD-10-CM | POA: Diagnosis not present

## 2017-09-29 DIAGNOSIS — R1312 Dysphagia, oropharyngeal phase: Secondary | ICD-10-CM | POA: Diagnosis not present

## 2017-09-30 DIAGNOSIS — M25561 Pain in right knee: Secondary | ICD-10-CM | POA: Diagnosis not present

## 2017-09-30 DIAGNOSIS — R1312 Dysphagia, oropharyngeal phase: Secondary | ICD-10-CM | POA: Diagnosis not present

## 2017-09-30 DIAGNOSIS — R2689 Other abnormalities of gait and mobility: Secondary | ICD-10-CM | POA: Diagnosis not present

## 2017-09-30 DIAGNOSIS — S12000S Unspecified displaced fracture of first cervical vertebra, sequela: Secondary | ICD-10-CM | POA: Diagnosis not present

## 2017-09-30 DIAGNOSIS — M6281 Muscle weakness (generalized): Secondary | ICD-10-CM | POA: Diagnosis not present

## 2017-09-30 DIAGNOSIS — S12100S Unspecified displaced fracture of second cervical vertebra, sequela: Secondary | ICD-10-CM | POA: Diagnosis not present

## 2017-09-30 DIAGNOSIS — M542 Cervicalgia: Secondary | ICD-10-CM | POA: Diagnosis not present

## 2017-10-01 DIAGNOSIS — M6281 Muscle weakness (generalized): Secondary | ICD-10-CM | POA: Diagnosis not present

## 2017-10-01 DIAGNOSIS — R1312 Dysphagia, oropharyngeal phase: Secondary | ICD-10-CM | POA: Diagnosis not present

## 2017-10-01 DIAGNOSIS — S12100S Unspecified displaced fracture of second cervical vertebra, sequela: Secondary | ICD-10-CM | POA: Diagnosis not present

## 2017-10-01 DIAGNOSIS — S12000S Unspecified displaced fracture of first cervical vertebra, sequela: Secondary | ICD-10-CM | POA: Diagnosis not present

## 2017-10-02 DIAGNOSIS — S12000S Unspecified displaced fracture of first cervical vertebra, sequela: Secondary | ICD-10-CM | POA: Diagnosis not present

## 2017-10-02 DIAGNOSIS — R2689 Other abnormalities of gait and mobility: Secondary | ICD-10-CM | POA: Diagnosis not present

## 2017-10-02 DIAGNOSIS — M25561 Pain in right knee: Secondary | ICD-10-CM | POA: Diagnosis not present

## 2017-10-02 DIAGNOSIS — M6281 Muscle weakness (generalized): Secondary | ICD-10-CM | POA: Diagnosis not present

## 2017-10-02 DIAGNOSIS — M542 Cervicalgia: Secondary | ICD-10-CM | POA: Diagnosis not present

## 2017-10-02 DIAGNOSIS — S12100S Unspecified displaced fracture of second cervical vertebra, sequela: Secondary | ICD-10-CM | POA: Diagnosis not present

## 2017-10-02 DIAGNOSIS — R1312 Dysphagia, oropharyngeal phase: Secondary | ICD-10-CM | POA: Diagnosis not present

## 2017-10-05 DIAGNOSIS — M6281 Muscle weakness (generalized): Secondary | ICD-10-CM | POA: Diagnosis not present

## 2017-10-05 DIAGNOSIS — S12000S Unspecified displaced fracture of first cervical vertebra, sequela: Secondary | ICD-10-CM | POA: Diagnosis not present

## 2017-10-05 DIAGNOSIS — S12100S Unspecified displaced fracture of second cervical vertebra, sequela: Secondary | ICD-10-CM | POA: Diagnosis not present

## 2017-10-05 DIAGNOSIS — R1312 Dysphagia, oropharyngeal phase: Secondary | ICD-10-CM | POA: Diagnosis not present

## 2017-10-06 DIAGNOSIS — M25561 Pain in right knee: Secondary | ICD-10-CM | POA: Diagnosis not present

## 2017-10-06 DIAGNOSIS — R2689 Other abnormalities of gait and mobility: Secondary | ICD-10-CM | POA: Diagnosis not present

## 2017-10-06 DIAGNOSIS — R1312 Dysphagia, oropharyngeal phase: Secondary | ICD-10-CM | POA: Diagnosis not present

## 2017-10-06 DIAGNOSIS — S12100S Unspecified displaced fracture of second cervical vertebra, sequela: Secondary | ICD-10-CM | POA: Diagnosis not present

## 2017-10-06 DIAGNOSIS — M542 Cervicalgia: Secondary | ICD-10-CM | POA: Diagnosis not present

## 2017-10-06 DIAGNOSIS — M6281 Muscle weakness (generalized): Secondary | ICD-10-CM | POA: Diagnosis not present

## 2017-10-06 DIAGNOSIS — S12000S Unspecified displaced fracture of first cervical vertebra, sequela: Secondary | ICD-10-CM | POA: Diagnosis not present

## 2017-10-07 DIAGNOSIS — R1312 Dysphagia, oropharyngeal phase: Secondary | ICD-10-CM | POA: Diagnosis not present

## 2017-10-07 DIAGNOSIS — S12100S Unspecified displaced fracture of second cervical vertebra, sequela: Secondary | ICD-10-CM | POA: Diagnosis not present

## 2017-10-07 DIAGNOSIS — S12000S Unspecified displaced fracture of first cervical vertebra, sequela: Secondary | ICD-10-CM | POA: Diagnosis not present

## 2017-10-07 DIAGNOSIS — M6281 Muscle weakness (generalized): Secondary | ICD-10-CM | POA: Diagnosis not present

## 2017-10-08 DIAGNOSIS — M6281 Muscle weakness (generalized): Secondary | ICD-10-CM | POA: Diagnosis not present

## 2017-10-08 DIAGNOSIS — S12100S Unspecified displaced fracture of second cervical vertebra, sequela: Secondary | ICD-10-CM | POA: Diagnosis not present

## 2017-10-08 DIAGNOSIS — S12000S Unspecified displaced fracture of first cervical vertebra, sequela: Secondary | ICD-10-CM | POA: Diagnosis not present

## 2017-10-08 DIAGNOSIS — R1312 Dysphagia, oropharyngeal phase: Secondary | ICD-10-CM | POA: Diagnosis not present

## 2017-10-09 DIAGNOSIS — S12100S Unspecified displaced fracture of second cervical vertebra, sequela: Secondary | ICD-10-CM | POA: Diagnosis not present

## 2017-10-09 DIAGNOSIS — S12000S Unspecified displaced fracture of first cervical vertebra, sequela: Secondary | ICD-10-CM | POA: Diagnosis not present

## 2017-10-09 DIAGNOSIS — R1312 Dysphagia, oropharyngeal phase: Secondary | ICD-10-CM | POA: Diagnosis not present

## 2017-10-09 DIAGNOSIS — M6281 Muscle weakness (generalized): Secondary | ICD-10-CM | POA: Diagnosis not present

## 2017-10-12 DIAGNOSIS — R1312 Dysphagia, oropharyngeal phase: Secondary | ICD-10-CM | POA: Diagnosis not present

## 2017-10-12 DIAGNOSIS — S12000S Unspecified displaced fracture of first cervical vertebra, sequela: Secondary | ICD-10-CM | POA: Diagnosis not present

## 2017-10-12 DIAGNOSIS — M6281 Muscle weakness (generalized): Secondary | ICD-10-CM | POA: Diagnosis not present

## 2017-10-12 DIAGNOSIS — S12100S Unspecified displaced fracture of second cervical vertebra, sequela: Secondary | ICD-10-CM | POA: Diagnosis not present

## 2017-10-13 DIAGNOSIS — S12100S Unspecified displaced fracture of second cervical vertebra, sequela: Secondary | ICD-10-CM | POA: Diagnosis not present

## 2017-10-13 DIAGNOSIS — M6281 Muscle weakness (generalized): Secondary | ICD-10-CM | POA: Diagnosis not present

## 2017-10-13 DIAGNOSIS — S12000S Unspecified displaced fracture of first cervical vertebra, sequela: Secondary | ICD-10-CM | POA: Diagnosis not present

## 2017-10-13 DIAGNOSIS — R1312 Dysphagia, oropharyngeal phase: Secondary | ICD-10-CM | POA: Diagnosis not present

## 2017-10-14 DIAGNOSIS — M6281 Muscle weakness (generalized): Secondary | ICD-10-CM | POA: Diagnosis not present

## 2017-10-14 DIAGNOSIS — S12000S Unspecified displaced fracture of first cervical vertebra, sequela: Secondary | ICD-10-CM | POA: Diagnosis not present

## 2017-10-14 DIAGNOSIS — R1312 Dysphagia, oropharyngeal phase: Secondary | ICD-10-CM | POA: Diagnosis not present

## 2017-10-14 DIAGNOSIS — S12100S Unspecified displaced fracture of second cervical vertebra, sequela: Secondary | ICD-10-CM | POA: Diagnosis not present

## 2017-10-15 DIAGNOSIS — M6281 Muscle weakness (generalized): Secondary | ICD-10-CM | POA: Diagnosis not present

## 2017-10-15 DIAGNOSIS — S12000S Unspecified displaced fracture of first cervical vertebra, sequela: Secondary | ICD-10-CM | POA: Diagnosis not present

## 2017-10-15 DIAGNOSIS — R1312 Dysphagia, oropharyngeal phase: Secondary | ICD-10-CM | POA: Diagnosis not present

## 2017-10-15 DIAGNOSIS — M25561 Pain in right knee: Secondary | ICD-10-CM | POA: Diagnosis not present

## 2017-10-15 DIAGNOSIS — S12100S Unspecified displaced fracture of second cervical vertebra, sequela: Secondary | ICD-10-CM | POA: Diagnosis not present

## 2017-10-15 DIAGNOSIS — R2689 Other abnormalities of gait and mobility: Secondary | ICD-10-CM | POA: Diagnosis not present

## 2017-10-15 DIAGNOSIS — M542 Cervicalgia: Secondary | ICD-10-CM | POA: Diagnosis not present

## 2017-10-16 DIAGNOSIS — M6281 Muscle weakness (generalized): Secondary | ICD-10-CM | POA: Diagnosis not present

## 2017-10-16 DIAGNOSIS — R1312 Dysphagia, oropharyngeal phase: Secondary | ICD-10-CM | POA: Diagnosis not present

## 2017-10-16 DIAGNOSIS — S12000S Unspecified displaced fracture of first cervical vertebra, sequela: Secondary | ICD-10-CM | POA: Diagnosis not present

## 2017-10-16 DIAGNOSIS — S12100S Unspecified displaced fracture of second cervical vertebra, sequela: Secondary | ICD-10-CM | POA: Diagnosis not present

## 2017-10-18 DIAGNOSIS — R1312 Dysphagia, oropharyngeal phase: Secondary | ICD-10-CM | POA: Diagnosis not present

## 2017-10-18 DIAGNOSIS — M6281 Muscle weakness (generalized): Secondary | ICD-10-CM | POA: Diagnosis not present

## 2017-10-18 DIAGNOSIS — S12000S Unspecified displaced fracture of first cervical vertebra, sequela: Secondary | ICD-10-CM | POA: Diagnosis not present

## 2017-10-18 DIAGNOSIS — S12100S Unspecified displaced fracture of second cervical vertebra, sequela: Secondary | ICD-10-CM | POA: Diagnosis not present

## 2017-10-19 DIAGNOSIS — M6281 Muscle weakness (generalized): Secondary | ICD-10-CM | POA: Diagnosis not present

## 2017-10-19 DIAGNOSIS — R1312 Dysphagia, oropharyngeal phase: Secondary | ICD-10-CM | POA: Diagnosis not present

## 2017-10-19 DIAGNOSIS — S12000S Unspecified displaced fracture of first cervical vertebra, sequela: Secondary | ICD-10-CM | POA: Diagnosis not present

## 2017-10-19 DIAGNOSIS — S12100S Unspecified displaced fracture of second cervical vertebra, sequela: Secondary | ICD-10-CM | POA: Diagnosis not present

## 2017-10-21 DIAGNOSIS — S12000S Unspecified displaced fracture of first cervical vertebra, sequela: Secondary | ICD-10-CM | POA: Diagnosis not present

## 2017-10-21 DIAGNOSIS — M1711 Unilateral primary osteoarthritis, right knee: Secondary | ICD-10-CM | POA: Diagnosis not present

## 2017-10-21 DIAGNOSIS — R1312 Dysphagia, oropharyngeal phase: Secondary | ICD-10-CM | POA: Diagnosis not present

## 2017-10-21 DIAGNOSIS — J45998 Other asthma: Secondary | ICD-10-CM | POA: Diagnosis not present

## 2017-10-21 DIAGNOSIS — M6281 Muscle weakness (generalized): Secondary | ICD-10-CM | POA: Diagnosis not present

## 2017-10-21 DIAGNOSIS — M25561 Pain in right knee: Secondary | ICD-10-CM | POA: Diagnosis not present

## 2017-10-21 DIAGNOSIS — S12100S Unspecified displaced fracture of second cervical vertebra, sequela: Secondary | ICD-10-CM | POA: Diagnosis not present

## 2017-10-21 DIAGNOSIS — Z7952 Long term (current) use of systemic steroids: Secondary | ICD-10-CM | POA: Diagnosis not present

## 2017-10-21 DIAGNOSIS — Z79899 Other long term (current) drug therapy: Secondary | ICD-10-CM | POA: Diagnosis not present

## 2017-10-22 DIAGNOSIS — S12000S Unspecified displaced fracture of first cervical vertebra, sequela: Secondary | ICD-10-CM | POA: Diagnosis not present

## 2017-10-22 DIAGNOSIS — S12100S Unspecified displaced fracture of second cervical vertebra, sequela: Secondary | ICD-10-CM | POA: Diagnosis not present

## 2017-10-22 DIAGNOSIS — M6281 Muscle weakness (generalized): Secondary | ICD-10-CM | POA: Diagnosis not present

## 2017-10-22 DIAGNOSIS — R1312 Dysphagia, oropharyngeal phase: Secondary | ICD-10-CM | POA: Diagnosis not present

## 2017-10-23 DIAGNOSIS — M6281 Muscle weakness (generalized): Secondary | ICD-10-CM | POA: Diagnosis not present

## 2017-10-23 DIAGNOSIS — S12000S Unspecified displaced fracture of first cervical vertebra, sequela: Secondary | ICD-10-CM | POA: Diagnosis not present

## 2017-10-23 DIAGNOSIS — S12100S Unspecified displaced fracture of second cervical vertebra, sequela: Secondary | ICD-10-CM | POA: Diagnosis not present

## 2017-10-23 DIAGNOSIS — R1312 Dysphagia, oropharyngeal phase: Secondary | ICD-10-CM | POA: Diagnosis not present

## 2017-10-26 DIAGNOSIS — M6281 Muscle weakness (generalized): Secondary | ICD-10-CM | POA: Diagnosis not present

## 2017-10-26 DIAGNOSIS — S12100S Unspecified displaced fracture of second cervical vertebra, sequela: Secondary | ICD-10-CM | POA: Diagnosis not present

## 2017-10-26 DIAGNOSIS — J449 Chronic obstructive pulmonary disease, unspecified: Secondary | ICD-10-CM | POA: Diagnosis not present

## 2017-10-26 DIAGNOSIS — R1312 Dysphagia, oropharyngeal phase: Secondary | ICD-10-CM | POA: Diagnosis not present

## 2017-10-26 DIAGNOSIS — S12000S Unspecified displaced fracture of first cervical vertebra, sequela: Secondary | ICD-10-CM | POA: Diagnosis not present

## 2017-10-27 DIAGNOSIS — S12100S Unspecified displaced fracture of second cervical vertebra, sequela: Secondary | ICD-10-CM | POA: Diagnosis not present

## 2017-10-27 DIAGNOSIS — R1312 Dysphagia, oropharyngeal phase: Secondary | ICD-10-CM | POA: Diagnosis not present

## 2017-10-27 DIAGNOSIS — S12000S Unspecified displaced fracture of first cervical vertebra, sequela: Secondary | ICD-10-CM | POA: Diagnosis not present

## 2017-10-27 DIAGNOSIS — M6281 Muscle weakness (generalized): Secondary | ICD-10-CM | POA: Diagnosis not present

## 2017-10-28 DIAGNOSIS — S12100S Unspecified displaced fracture of second cervical vertebra, sequela: Secondary | ICD-10-CM | POA: Diagnosis not present

## 2017-10-28 DIAGNOSIS — M6281 Muscle weakness (generalized): Secondary | ICD-10-CM | POA: Diagnosis not present

## 2017-10-28 DIAGNOSIS — S12000S Unspecified displaced fracture of first cervical vertebra, sequela: Secondary | ICD-10-CM | POA: Diagnosis not present

## 2017-10-28 DIAGNOSIS — R1312 Dysphagia, oropharyngeal phase: Secondary | ICD-10-CM | POA: Diagnosis not present

## 2017-10-29 DIAGNOSIS — M6281 Muscle weakness (generalized): Secondary | ICD-10-CM | POA: Diagnosis not present

## 2017-10-29 DIAGNOSIS — S12000S Unspecified displaced fracture of first cervical vertebra, sequela: Secondary | ICD-10-CM | POA: Diagnosis not present

## 2017-10-29 DIAGNOSIS — S12100S Unspecified displaced fracture of second cervical vertebra, sequela: Secondary | ICD-10-CM | POA: Diagnosis not present

## 2017-10-29 DIAGNOSIS — R1312 Dysphagia, oropharyngeal phase: Secondary | ICD-10-CM | POA: Diagnosis not present

## 2017-10-30 DIAGNOSIS — R1312 Dysphagia, oropharyngeal phase: Secondary | ICD-10-CM | POA: Diagnosis not present

## 2017-10-30 DIAGNOSIS — S12100S Unspecified displaced fracture of second cervical vertebra, sequela: Secondary | ICD-10-CM | POA: Diagnosis not present

## 2017-10-30 DIAGNOSIS — S12000S Unspecified displaced fracture of first cervical vertebra, sequela: Secondary | ICD-10-CM | POA: Diagnosis not present

## 2017-10-30 DIAGNOSIS — M6281 Muscle weakness (generalized): Secondary | ICD-10-CM | POA: Diagnosis not present

## 2017-11-02 DIAGNOSIS — R1312 Dysphagia, oropharyngeal phase: Secondary | ICD-10-CM | POA: Diagnosis not present

## 2017-11-02 DIAGNOSIS — S12000S Unspecified displaced fracture of first cervical vertebra, sequela: Secondary | ICD-10-CM | POA: Diagnosis not present

## 2017-11-02 DIAGNOSIS — M6281 Muscle weakness (generalized): Secondary | ICD-10-CM | POA: Diagnosis not present

## 2017-11-02 DIAGNOSIS — S12100S Unspecified displaced fracture of second cervical vertebra, sequela: Secondary | ICD-10-CM | POA: Diagnosis not present

## 2017-11-03 DIAGNOSIS — M6281 Muscle weakness (generalized): Secondary | ICD-10-CM | POA: Diagnosis not present

## 2017-11-03 DIAGNOSIS — R1312 Dysphagia, oropharyngeal phase: Secondary | ICD-10-CM | POA: Diagnosis not present

## 2017-11-03 DIAGNOSIS — S12000S Unspecified displaced fracture of first cervical vertebra, sequela: Secondary | ICD-10-CM | POA: Diagnosis not present

## 2017-11-03 DIAGNOSIS — S12100S Unspecified displaced fracture of second cervical vertebra, sequela: Secondary | ICD-10-CM | POA: Diagnosis not present

## 2017-11-04 DIAGNOSIS — S12000S Unspecified displaced fracture of first cervical vertebra, sequela: Secondary | ICD-10-CM | POA: Diagnosis not present

## 2017-11-04 DIAGNOSIS — M6281 Muscle weakness (generalized): Secondary | ICD-10-CM | POA: Diagnosis not present

## 2017-11-04 DIAGNOSIS — S12100S Unspecified displaced fracture of second cervical vertebra, sequela: Secondary | ICD-10-CM | POA: Diagnosis not present

## 2017-11-04 DIAGNOSIS — R1312 Dysphagia, oropharyngeal phase: Secondary | ICD-10-CM | POA: Diagnosis not present

## 2017-11-05 DIAGNOSIS — S12000S Unspecified displaced fracture of first cervical vertebra, sequela: Secondary | ICD-10-CM | POA: Diagnosis not present

## 2017-11-05 DIAGNOSIS — M6281 Muscle weakness (generalized): Secondary | ICD-10-CM | POA: Diagnosis not present

## 2017-11-05 DIAGNOSIS — R1312 Dysphagia, oropharyngeal phase: Secondary | ICD-10-CM | POA: Diagnosis not present

## 2017-11-05 DIAGNOSIS — S12100S Unspecified displaced fracture of second cervical vertebra, sequela: Secondary | ICD-10-CM | POA: Diagnosis not present

## 2017-11-06 DIAGNOSIS — S12000S Unspecified displaced fracture of first cervical vertebra, sequela: Secondary | ICD-10-CM | POA: Diagnosis not present

## 2017-11-06 DIAGNOSIS — M6281 Muscle weakness (generalized): Secondary | ICD-10-CM | POA: Diagnosis not present

## 2017-11-06 DIAGNOSIS — S12100S Unspecified displaced fracture of second cervical vertebra, sequela: Secondary | ICD-10-CM | POA: Diagnosis not present

## 2017-11-06 DIAGNOSIS — R1312 Dysphagia, oropharyngeal phase: Secondary | ICD-10-CM | POA: Diagnosis not present

## 2017-11-09 DIAGNOSIS — R1312 Dysphagia, oropharyngeal phase: Secondary | ICD-10-CM | POA: Diagnosis not present

## 2017-11-09 DIAGNOSIS — M6281 Muscle weakness (generalized): Secondary | ICD-10-CM | POA: Diagnosis not present

## 2017-11-09 DIAGNOSIS — S12100S Unspecified displaced fracture of second cervical vertebra, sequela: Secondary | ICD-10-CM | POA: Diagnosis not present

## 2017-11-09 DIAGNOSIS — S12000S Unspecified displaced fracture of first cervical vertebra, sequela: Secondary | ICD-10-CM | POA: Diagnosis not present

## 2017-11-10 DIAGNOSIS — S12100S Unspecified displaced fracture of second cervical vertebra, sequela: Secondary | ICD-10-CM | POA: Diagnosis not present

## 2017-11-10 DIAGNOSIS — R1312 Dysphagia, oropharyngeal phase: Secondary | ICD-10-CM | POA: Diagnosis not present

## 2017-11-10 DIAGNOSIS — S12000S Unspecified displaced fracture of first cervical vertebra, sequela: Secondary | ICD-10-CM | POA: Diagnosis not present

## 2017-11-10 DIAGNOSIS — M6281 Muscle weakness (generalized): Secondary | ICD-10-CM | POA: Diagnosis not present

## 2017-11-11 DIAGNOSIS — S12100S Unspecified displaced fracture of second cervical vertebra, sequela: Secondary | ICD-10-CM | POA: Diagnosis not present

## 2017-11-11 DIAGNOSIS — S12000S Unspecified displaced fracture of first cervical vertebra, sequela: Secondary | ICD-10-CM | POA: Diagnosis not present

## 2017-11-11 DIAGNOSIS — M6281 Muscle weakness (generalized): Secondary | ICD-10-CM | POA: Diagnosis not present

## 2017-11-11 DIAGNOSIS — R1312 Dysphagia, oropharyngeal phase: Secondary | ICD-10-CM | POA: Diagnosis not present

## 2017-11-12 DIAGNOSIS — S12000S Unspecified displaced fracture of first cervical vertebra, sequela: Secondary | ICD-10-CM | POA: Diagnosis not present

## 2017-11-12 DIAGNOSIS — M6281 Muscle weakness (generalized): Secondary | ICD-10-CM | POA: Diagnosis not present

## 2017-11-12 DIAGNOSIS — R1312 Dysphagia, oropharyngeal phase: Secondary | ICD-10-CM | POA: Diagnosis not present

## 2017-11-12 DIAGNOSIS — S12100S Unspecified displaced fracture of second cervical vertebra, sequela: Secondary | ICD-10-CM | POA: Diagnosis not present

## 2017-11-13 DIAGNOSIS — R1312 Dysphagia, oropharyngeal phase: Secondary | ICD-10-CM | POA: Diagnosis not present

## 2017-11-13 DIAGNOSIS — S12100S Unspecified displaced fracture of second cervical vertebra, sequela: Secondary | ICD-10-CM | POA: Diagnosis not present

## 2017-11-13 DIAGNOSIS — M6281 Muscle weakness (generalized): Secondary | ICD-10-CM | POA: Diagnosis not present

## 2017-11-13 DIAGNOSIS — S12000S Unspecified displaced fracture of first cervical vertebra, sequela: Secondary | ICD-10-CM | POA: Diagnosis not present

## 2017-11-15 DIAGNOSIS — R1312 Dysphagia, oropharyngeal phase: Secondary | ICD-10-CM | POA: Diagnosis not present

## 2017-11-15 DIAGNOSIS — M6281 Muscle weakness (generalized): Secondary | ICD-10-CM | POA: Diagnosis not present

## 2017-11-15 DIAGNOSIS — S12100S Unspecified displaced fracture of second cervical vertebra, sequela: Secondary | ICD-10-CM | POA: Diagnosis not present

## 2017-11-15 DIAGNOSIS — S12000S Unspecified displaced fracture of first cervical vertebra, sequela: Secondary | ICD-10-CM | POA: Diagnosis not present

## 2017-11-16 DIAGNOSIS — S12000S Unspecified displaced fracture of first cervical vertebra, sequela: Secondary | ICD-10-CM | POA: Diagnosis not present

## 2017-11-16 DIAGNOSIS — R1312 Dysphagia, oropharyngeal phase: Secondary | ICD-10-CM | POA: Diagnosis not present

## 2017-11-16 DIAGNOSIS — S12100S Unspecified displaced fracture of second cervical vertebra, sequela: Secondary | ICD-10-CM | POA: Diagnosis not present

## 2017-11-16 DIAGNOSIS — I10 Essential (primary) hypertension: Secondary | ICD-10-CM | POA: Diagnosis not present

## 2017-11-16 DIAGNOSIS — J449 Chronic obstructive pulmonary disease, unspecified: Secondary | ICD-10-CM | POA: Diagnosis not present

## 2017-11-16 DIAGNOSIS — R05 Cough: Secondary | ICD-10-CM | POA: Diagnosis not present

## 2017-11-16 DIAGNOSIS — M6281 Muscle weakness (generalized): Secondary | ICD-10-CM | POA: Diagnosis not present

## 2017-11-17 DIAGNOSIS — S12100S Unspecified displaced fracture of second cervical vertebra, sequela: Secondary | ICD-10-CM | POA: Diagnosis not present

## 2017-11-17 DIAGNOSIS — M6281 Muscle weakness (generalized): Secondary | ICD-10-CM | POA: Diagnosis not present

## 2017-11-17 DIAGNOSIS — R1312 Dysphagia, oropharyngeal phase: Secondary | ICD-10-CM | POA: Diagnosis not present

## 2017-11-17 DIAGNOSIS — S12000S Unspecified displaced fracture of first cervical vertebra, sequela: Secondary | ICD-10-CM | POA: Diagnosis not present

## 2017-11-19 DIAGNOSIS — S12000S Unspecified displaced fracture of first cervical vertebra, sequela: Secondary | ICD-10-CM | POA: Diagnosis not present

## 2017-11-19 DIAGNOSIS — S12100S Unspecified displaced fracture of second cervical vertebra, sequela: Secondary | ICD-10-CM | POA: Diagnosis not present

## 2017-11-19 DIAGNOSIS — M6281 Muscle weakness (generalized): Secondary | ICD-10-CM | POA: Diagnosis not present

## 2017-11-19 DIAGNOSIS — R1312 Dysphagia, oropharyngeal phase: Secondary | ICD-10-CM | POA: Diagnosis not present

## 2017-11-20 DIAGNOSIS — R1312 Dysphagia, oropharyngeal phase: Secondary | ICD-10-CM | POA: Diagnosis not present

## 2017-11-20 DIAGNOSIS — S12100S Unspecified displaced fracture of second cervical vertebra, sequela: Secondary | ICD-10-CM | POA: Diagnosis not present

## 2017-11-20 DIAGNOSIS — M6281 Muscle weakness (generalized): Secondary | ICD-10-CM | POA: Diagnosis not present

## 2017-11-20 DIAGNOSIS — S12000S Unspecified displaced fracture of first cervical vertebra, sequela: Secondary | ICD-10-CM | POA: Diagnosis not present

## 2017-11-23 DIAGNOSIS — R1312 Dysphagia, oropharyngeal phase: Secondary | ICD-10-CM | POA: Diagnosis not present

## 2017-11-23 DIAGNOSIS — S12000S Unspecified displaced fracture of first cervical vertebra, sequela: Secondary | ICD-10-CM | POA: Diagnosis not present

## 2017-11-23 DIAGNOSIS — M6281 Muscle weakness (generalized): Secondary | ICD-10-CM | POA: Diagnosis not present

## 2017-11-23 DIAGNOSIS — S12100S Unspecified displaced fracture of second cervical vertebra, sequela: Secondary | ICD-10-CM | POA: Diagnosis not present

## 2017-11-24 DIAGNOSIS — M6281 Muscle weakness (generalized): Secondary | ICD-10-CM | POA: Diagnosis not present

## 2017-11-24 DIAGNOSIS — S12000S Unspecified displaced fracture of first cervical vertebra, sequela: Secondary | ICD-10-CM | POA: Diagnosis not present

## 2017-11-24 DIAGNOSIS — D649 Anemia, unspecified: Secondary | ICD-10-CM | POA: Diagnosis not present

## 2017-11-24 DIAGNOSIS — R1312 Dysphagia, oropharyngeal phase: Secondary | ICD-10-CM | POA: Diagnosis not present

## 2017-11-24 DIAGNOSIS — S12100S Unspecified displaced fracture of second cervical vertebra, sequela: Secondary | ICD-10-CM | POA: Diagnosis not present

## 2017-11-25 DIAGNOSIS — R1312 Dysphagia, oropharyngeal phase: Secondary | ICD-10-CM | POA: Diagnosis not present

## 2017-11-25 DIAGNOSIS — M6281 Muscle weakness (generalized): Secondary | ICD-10-CM | POA: Diagnosis not present

## 2017-11-25 DIAGNOSIS — S12000S Unspecified displaced fracture of first cervical vertebra, sequela: Secondary | ICD-10-CM | POA: Diagnosis not present

## 2017-11-25 DIAGNOSIS — S12100S Unspecified displaced fracture of second cervical vertebra, sequela: Secondary | ICD-10-CM | POA: Diagnosis not present

## 2017-11-26 DIAGNOSIS — M6281 Muscle weakness (generalized): Secondary | ICD-10-CM | POA: Diagnosis not present

## 2017-11-26 DIAGNOSIS — S12000S Unspecified displaced fracture of first cervical vertebra, sequela: Secondary | ICD-10-CM | POA: Diagnosis not present

## 2017-11-26 DIAGNOSIS — R1312 Dysphagia, oropharyngeal phase: Secondary | ICD-10-CM | POA: Diagnosis not present

## 2017-11-26 DIAGNOSIS — S12100S Unspecified displaced fracture of second cervical vertebra, sequela: Secondary | ICD-10-CM | POA: Diagnosis not present

## 2017-11-27 DIAGNOSIS — M6281 Muscle weakness (generalized): Secondary | ICD-10-CM | POA: Diagnosis not present

## 2017-11-27 DIAGNOSIS — S12000S Unspecified displaced fracture of first cervical vertebra, sequela: Secondary | ICD-10-CM | POA: Diagnosis not present

## 2017-11-27 DIAGNOSIS — R1312 Dysphagia, oropharyngeal phase: Secondary | ICD-10-CM | POA: Diagnosis not present

## 2017-11-27 DIAGNOSIS — S12100S Unspecified displaced fracture of second cervical vertebra, sequela: Secondary | ICD-10-CM | POA: Diagnosis not present

## 2017-12-04 DIAGNOSIS — R2689 Other abnormalities of gait and mobility: Secondary | ICD-10-CM | POA: Diagnosis not present

## 2017-12-04 DIAGNOSIS — M25561 Pain in right knee: Secondary | ICD-10-CM | POA: Diagnosis not present

## 2017-12-04 DIAGNOSIS — M542 Cervicalgia: Secondary | ICD-10-CM | POA: Diagnosis not present

## 2017-12-21 DIAGNOSIS — J441 Chronic obstructive pulmonary disease with (acute) exacerbation: Secondary | ICD-10-CM | POA: Diagnosis not present

## 2017-12-22 DIAGNOSIS — J449 Chronic obstructive pulmonary disease, unspecified: Secondary | ICD-10-CM | POA: Diagnosis not present

## 2017-12-22 DIAGNOSIS — J45909 Unspecified asthma, uncomplicated: Secondary | ICD-10-CM | POA: Diagnosis not present

## 2017-12-23 DIAGNOSIS — J45909 Unspecified asthma, uncomplicated: Secondary | ICD-10-CM | POA: Diagnosis not present

## 2017-12-23 DIAGNOSIS — J449 Chronic obstructive pulmonary disease, unspecified: Secondary | ICD-10-CM | POA: Diagnosis not present

## 2017-12-23 DIAGNOSIS — R918 Other nonspecific abnormal finding of lung field: Secondary | ICD-10-CM | POA: Diagnosis not present

## 2017-12-28 DIAGNOSIS — J441 Chronic obstructive pulmonary disease with (acute) exacerbation: Secondary | ICD-10-CM | POA: Diagnosis not present

## 2018-01-05 DIAGNOSIS — D649 Anemia, unspecified: Secondary | ICD-10-CM | POA: Diagnosis not present

## 2018-01-05 DIAGNOSIS — Z882 Allergy status to sulfonamides status: Secondary | ICD-10-CM | POA: Diagnosis not present

## 2018-01-05 DIAGNOSIS — Z888 Allergy status to other drugs, medicaments and biological substances status: Secondary | ICD-10-CM | POA: Diagnosis not present

## 2018-01-06 DIAGNOSIS — M79605 Pain in left leg: Secondary | ICD-10-CM | POA: Diagnosis not present

## 2018-01-11 DIAGNOSIS — M79605 Pain in left leg: Secondary | ICD-10-CM | POA: Diagnosis not present

## 2018-01-12 DIAGNOSIS — J45909 Unspecified asthma, uncomplicated: Secondary | ICD-10-CM | POA: Diagnosis not present

## 2018-01-12 DIAGNOSIS — J441 Chronic obstructive pulmonary disease with (acute) exacerbation: Secondary | ICD-10-CM | POA: Diagnosis not present

## 2018-01-12 DIAGNOSIS — M79605 Pain in left leg: Secondary | ICD-10-CM | POA: Diagnosis not present

## 2018-01-13 DIAGNOSIS — M79605 Pain in left leg: Secondary | ICD-10-CM | POA: Diagnosis not present

## 2018-01-15 ENCOUNTER — Ambulatory Visit: Payer: Medicare Other | Admitting: Family

## 2018-01-15 ENCOUNTER — Encounter (HOSPITAL_COMMUNITY): Payer: Medicare Other

## 2018-01-15 ENCOUNTER — Other Ambulatory Visit (HOSPITAL_COMMUNITY): Payer: Self-pay | Admitting: Internal Medicine

## 2018-01-15 DIAGNOSIS — M5440 Lumbago with sciatica, unspecified side: Secondary | ICD-10-CM

## 2018-01-15 DIAGNOSIS — M5416 Radiculopathy, lumbar region: Secondary | ICD-10-CM | POA: Diagnosis not present

## 2018-01-18 DIAGNOSIS — M79605 Pain in left leg: Secondary | ICD-10-CM | POA: Diagnosis not present

## 2018-01-20 DIAGNOSIS — M79605 Pain in left leg: Secondary | ICD-10-CM | POA: Diagnosis not present

## 2018-01-21 ENCOUNTER — Encounter (HOSPITAL_COMMUNITY): Payer: Self-pay | Admitting: Emergency Medicine

## 2018-01-21 ENCOUNTER — Other Ambulatory Visit: Payer: Self-pay

## 2018-01-21 ENCOUNTER — Emergency Department (HOSPITAL_COMMUNITY)
Admission: EM | Admit: 2018-01-21 | Discharge: 2018-01-21 | Disposition: A | Discharge status: Home / Self Care | Payer: Medicare Other | Attending: Emergency Medicine | Admitting: Emergency Medicine

## 2018-01-21 ENCOUNTER — Ambulatory Visit (HOSPITAL_COMMUNITY)
Admission: RE | Admit: 2018-01-21 | Discharge: 2018-01-21 | Disposition: A | Discharge status: Home / Self Care | Payer: Medicare Other | Source: Ambulatory Visit | Attending: Internal Medicine | Admitting: Internal Medicine

## 2018-01-21 DIAGNOSIS — X509XXA Other and unspecified overexertion or strenuous movements or postures, initial encounter: Secondary | ICD-10-CM | POA: Diagnosis not present

## 2018-01-21 DIAGNOSIS — J45909 Unspecified asthma, uncomplicated: Secondary | ICD-10-CM | POA: Insufficient documentation

## 2018-01-21 DIAGNOSIS — M5416 Radiculopathy, lumbar region: Secondary | ICD-10-CM | POA: Diagnosis not present

## 2018-01-21 DIAGNOSIS — N189 Chronic kidney disease, unspecified: Secondary | ICD-10-CM | POA: Insufficient documentation

## 2018-01-21 DIAGNOSIS — Y92129 Unspecified place in nursing home as the place of occurrence of the external cause: Secondary | ICD-10-CM | POA: Diagnosis not present

## 2018-01-21 DIAGNOSIS — M546 Pain in thoracic spine: Secondary | ICD-10-CM | POA: Diagnosis not present

## 2018-01-21 DIAGNOSIS — M5440 Lumbago with sciatica, unspecified side: Secondary | ICD-10-CM

## 2018-01-21 DIAGNOSIS — M48061 Spinal stenosis, lumbar region without neurogenic claudication: Secondary | ICD-10-CM | POA: Insufficient documentation

## 2018-01-21 DIAGNOSIS — S32000A Wedge compression fracture of unspecified lumbar vertebra, initial encounter for closed fracture: Secondary | ICD-10-CM | POA: Insufficient documentation

## 2018-01-21 DIAGNOSIS — Z955 Presence of coronary angioplasty implant and graft: Secondary | ICD-10-CM | POA: Insufficient documentation

## 2018-01-21 DIAGNOSIS — I129 Hypertensive chronic kidney disease with stage 1 through stage 4 chronic kidney disease, or unspecified chronic kidney disease: Secondary | ICD-10-CM | POA: Diagnosis not present

## 2018-01-21 DIAGNOSIS — Y939 Activity, unspecified: Secondary | ICD-10-CM | POA: Insufficient documentation

## 2018-01-21 DIAGNOSIS — M4856XA Collapsed vertebra, not elsewhere classified, lumbar region, initial encounter for fracture: Secondary | ICD-10-CM | POA: Insufficient documentation

## 2018-01-21 DIAGNOSIS — R531 Weakness: Secondary | ICD-10-CM | POA: Diagnosis not present

## 2018-01-21 DIAGNOSIS — S32040A Wedge compression fracture of fourth lumbar vertebra, initial encounter for closed fracture: Secondary | ICD-10-CM | POA: Diagnosis not present

## 2018-01-21 DIAGNOSIS — Y999 Unspecified external cause status: Secondary | ICD-10-CM | POA: Insufficient documentation

## 2018-01-21 DIAGNOSIS — S3992XA Unspecified injury of lower back, initial encounter: Secondary | ICD-10-CM | POA: Diagnosis present

## 2018-01-21 DIAGNOSIS — M4854XA Collapsed vertebra, not elsewhere classified, thoracic region, initial encounter for fracture: Secondary | ICD-10-CM

## 2018-01-21 DIAGNOSIS — M5136 Other intervertebral disc degeneration, lumbar region: Secondary | ICD-10-CM

## 2018-01-21 DIAGNOSIS — R404 Transient alteration of awareness: Secondary | ICD-10-CM | POA: Diagnosis not present

## 2018-01-21 DIAGNOSIS — M5116 Intervertebral disc disorders with radiculopathy, lumbar region: Secondary | ICD-10-CM | POA: Diagnosis not present

## 2018-01-21 LAB — POCT I-STAT CREATININE: Creatinine, Ser: 1.5 mg/dL — ABNORMAL HIGH (ref 0.44–1.00)

## 2018-01-21 MED ORDER — GABAPENTIN 100 MG PO CAPS: 100.0000 mg | ORAL_CAPSULE | Freq: Three times a day (TID) | ORAL | 0 refills | Status: DC

## 2018-01-21 MED ORDER — GADOBENATE DIMEGLUMINE 529 MG/ML IV SOLN
10.0000 mL | Freq: Once | INTRAVENOUS | Status: AC | PRN
  Administered 2018-01-21: 10 mL via INTRAVENOUS

## 2018-01-21 MED ORDER — DOCUSATE SODIUM 100 MG PO CAPS: 100.0000 mg | ORAL_CAPSULE | Freq: Two times a day (BID) | ORAL | 0 refills | Status: DC

## 2018-01-21 MED ORDER — OXYCODONE HCL 5 MG PO TABS: 5.0000 mg | ORAL_TABLET | Freq: Four times a day (QID) | ORAL | 0 refills | Status: DC | PRN

## 2018-01-21 MED ORDER — MORPHINE SULFATE (PF) 4 MG/ML IV SOLN
4.0000 mg | Freq: Once | INTRAVENOUS | Status: AC
  Administered 2018-01-21: 4 mg via INTRAMUSCULAR
  Filled 2018-01-21: qty 1

## 2018-01-21 MED ORDER — PREDNISONE 20 MG PO TABS: ORAL_TABLET | ORAL | 0 refills | Status: DC

## 2018-01-21 MED ORDER — METHYLPREDNISOLONE SODIUM SUCC 125 MG IJ SOLR
125.0000 mg | Freq: Once | INTRAMUSCULAR | Status: AC
  Administered 2018-01-21: 125 mg via INTRAMUSCULAR
  Filled 2018-01-21: qty 2

## 2018-01-21 NOTE — ED Notes (Signed)
Report given to [place], LPN at [facility]. Awaiting EMS for transport back to nursing home.

## 2018-01-21 NOTE — ED Provider Notes (Signed)
Emergency Department Provider Note   I have reviewed the triage vital signs and the nursing notes.   HISTORY  Chief Complaint Back Pain   HPI Jordan [name] is a 72 y.o. female with multiple medical problems as documented below the presents to the emergency department today from a skilled nursing facility secondary to a little over a week's worth of back pain that has been associated with sharp pain that radiates down her left leg.  She would get MRI today showed a new compression fracture was sent here for evaluation.  Patient has numbness in her right leg but this is old because of her knee pain.  She also has some numbness and weakness in her right upper extremity from a fall back in August.  She has no other neurologic complaints.  She states that she has not had any recent trauma the last one being that fall in August.  No fevers.  Not sure how this fracture could have happened when the pain started. No other associated or modifying symptoms.    Past Medical History:  Diagnosis Date  . Allergy    seasonal  . Anemia   . Arthritis   . Asthma    uses Advair and Spiriva daily,Albuterol prn.Takes Singulair at bedtime and Prednisone daily  . Back spasm    takes Zanaflex daily as needed  . Blood transfusion 2007   no abnormal reation noted   . Bruises easily    d/t being on Prednisone  . Bursitis    left elbow  . C. difficile colitis    2007: treated with Flagyl and Vanc, 2014: treated with vanc, Aug 2014: Vanc taper  . Chronic back pain   . Chronic kidney disease    "creatine" creeping up - followed at this time by PCP  . Complication of anesthesia 2000   woke up during one surgery- ovary surgery  . COPD (chronic obstructive pulmonary disease) ([place])   . Depression    takes Prozac daily  . H/O hiatal hernia   . Hemorrhoids   . History of bronchitis    last time a yr ago  . History of colon polyps   . History of kidney stones   . History of kidney stones   .  Hyperlipidemia    hx of-was on meds but has been off x 1 1/2 yrs  . Hypertension    takes Verapamil nightly  . IBS (irritable bowel syndrome)   . Insomnia    takes Benadryl as needed  . 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 Nocturia   . Osteoporosis    takes Fosamax weekly  . Pneumonia    MRSA pneumonia in 2007  . Shortness of breath    with exertion daily  . Stroke [facility])    "they say I has a small stroke".No deficits  . Tingling    and pain in left leg  . Urinary frequency     Patient Active Problem List   Diagnosis Date Noted  . Anemia 02/20/2017  . History of colon polyps 02/20/2017  . Chronic diarrhea 02/20/2017  . Abnormal weight loss 02/20/2017  . Carotid stenosis, symptomatic w/o infarct 01/01/2016  . Carotid stenosis 11/27/2015  . Ganglion cyst of wrist   . Hematoma 01/23/2014  . Lumbar foraminal stenosis 10/17/2013  . Olecranon bursitis of left elbow 09/14/2013  . Left elbow pain 09/14/2013  . C. difficile diarrhea 06/20/2013  . Loose stools 03/23/2013  . TAKOTSUBO  SYNDROME 05/04/2009  . PALPITATIONS 05/04/2009  . CHEST PAIN UNSPECIFIED 05/04/2009  . HYPERTENSION 04/24/2009  . ASTHMA 04/24/2009  . COPD 04/24/2009    Past Surgical History:  Procedure Laterality Date  . APPENDECTOMY    . BIOPSY  04/13/2017   Procedure: BIOPSY;  Surgeon: Daneil Dolin, MD;  [facility];  Service: Endoscopy;;  right and left colon   . CARPAL TUNNEL RELEASE Left   . CARPAL TUNNEL RELEASE Right 09/21/2015   Procedure: CARPAL TUNNEL RELEASE;  Surgeon: Carole Civil, MD;  Location: [facility] OR[facility];  Service: Orthopedics;  Laterality: Right;  . cataract surgery Bilateral   . CHOLECYSTECTOMY    . COLONOSCOPY   09/24/2006   RMR: Normal rectum, left-sided diverticula  . COLONOSCOPY  Oct 2012   Dr. Henrene Pastor: tubular adenomas, moderative diverticulosis, internal hemorrhoids  . COLONOSCOPY WITH PROPOFOL N/A 04/13/2017   Procedure: COLONOSCOPY WITH  PROPOFOL;  Surgeon: Daneil Dolin, MD;  [facility];  Service: Endoscopy;  Laterality: N/A;  8:30am  . CYSTOSCOPY    . ENDARTERECTOMY Right 12/12/2015   Procedure: ENDARTERECTOMY RIGHT CAROTID, RESECTION OF REDUNDANT RIGHT COMMON CAROTID ARTERY WITH PRIMARY REANASTOMOSIS;  Surgeon: Mal Misty, MD;  Location: [place];  Service: Vascular;  Laterality: Right;  . ESOPHAGOGASTRODUODENOSCOPY  04/07/2006   DTO:IZTIWP esophagus s/p placement of Bravo pH probe, some surgical changes but not typical of fundoplication wrap, may have slipped  . GANGLION CYST EXCISION Right 09/21/2015   Procedure: RIGHT WRIST GANGLION CYST REMOVAL;  Surgeon: Carole Civil, MD;  Location: [facility];  Service: Orthopedics;  Laterality: Right;  . hemorrhoidal banding    . HERNIA REPAIR     umbilical  . KNEE ARTHROSCOPY Right   . LUMBAR LAMINECTOMY/DECOMPRESSION MICRODISCECTOMY Left 10/17/2013   Procedure: LUMBAR LAMINECTOMY/DECOMPRESSION MICRODISCECTOMY 1 LEVEL three/four;  Surgeon: Ophelia Charter, MD;  Location: [place] [facility];  Service: Neurosurgery;  Laterality: Left;  . NECK SURGERY     fusion-   . NISSEN FUNDOPLICATION     x 2  . OLECRANON BURSECTOMY Left 01/11/2014   Procedure: OLECRANON BURSECTOMY;  Surgeon: Carole Civil, MD;  Location: [facility];  Service: Orthopedics;  Laterality: Left;  . OOPHORECTOMY Right   . PATCH ANGIOPLASTY Right 12/12/2015   Procedure: PATCH ANGIOPLASTY RIGHT CAROTID ARTERY USING HEMASHIELD PLATINUM FINESSE PATCH;  Surgeon: Mal Misty, MD;  Location: [place];  Service: Vascular;  Laterality: Right;  . TONSILLECTOMY      Current Outpatient Rx  . Order #: [id number] Class: Historical Med  . Order #: [id number] Class: Historical Med  . Order #: [id number] Class: Print  . Order #: [id number] Class: Historical Med  . Order #: [id number] Class: Print  . Order #: [id number] Class: Historical Med  . Order #: [id number] Class: Historical Med  . Order #: [id number] Class: Historical Med  . Order  #: [id number] Class: Historical Med  . Order #: [id number] Class: Print  . Order #: [id number] Class: Print    Allergies Cardura [doxazosin mesylate]; Strawberry leaves extract; Barbiturates; Levofloxacin; and Sulfa antibiotics  Family History  Problem Relation Age of Onset  . Heart disease Father   . Colon cancer Neg Hx     Social History Social History   Tobacco Use  . Smoking status: Never Smoker  . Smokeless tobacco: Never Used  Substance Use Topics  . Alcohol use: Not Currently    Alcohol/week: 6.0 oz    Types: 10 Cans of beer per week    Comment: couple of beers daily  . Drug  use: No    Review of Systems  All other systems negative except as documented in the HPI. All pertinent positives and negatives as reviewed in the HPI. ____________________________________________   PHYSICAL EXAM:  VITAL SIGNS: ED Triage Vitals  Enc Vitals Group     BP 01/21/18 1436 139/65     Pulse Rate 01/21/18 1436 81     Resp 01/21/18 1436 18     Temp 01/21/18 1436 98.5 F (36.9 C)     Temp Source 01/21/18 1436 Oral     SpO2 01/21/18 1436 95 %     Weight 01/21/18 1439 103 lb (46.7 kg)     Height 01/21/18 1439 5\' 2"  (1.575 m)     Head Circumference --      Peak Flow --      Pain Score 01/21/18 1438 5     Pain Loc --      Pain Edu? --      Excl. in [place]? --     Constitutional: Alert and oriented. Well appearing and in no acute distress. Eyes: Conjunctivae are normal. PERRL. EOMI. Head: Atraumatic. Nose: No congestion/rhinnorhea. Mouth/Throat: Mucous membranes are moist.  Oropharynx non-erythematous. Neck: No stridor.  No meningeal signs.   Cardiovascular: Normal rate, regular rhythm. Good peripheral circulation. Grossly normal heart sounds.   Respiratory: Normal respiratory effort.  No retractions. Lungs CTAB. Gastrointestinal: Soft and nontender. No distention.  Musculoskeletal: No lower extremity tenderness nor edema. No gross deformities of extremities. Mid-line lower back  TTP Neurologic:  Normal speech and language. Decreased sensation in LLE around knee. Decreased sensation and strength in L arm. R arm and leg seem to be 5/5 in strength. Normal sensation. No perineal anesthesia.  Skin:  Skin is warm, dry and intact. No rash noted.   ____________________________________________    RADIOLOGY  Mr Lumbar Spine W Wo Contrast  Result Date: 01/21/2018 CLINICAL DATA:  72 year old female with lumbar back pain radiating to both legs for 3 months with difficulty walking. No known injury. EXAM: MRI LUMBAR SPINE WITHOUT AND WITH CONTRAST TECHNIQUE: Multiplanar and multiecho pulse sequences of the lumbar spine were obtained without and with intravenous contrast. CONTRAST:  32mL MULTIHANCE GADOBENATE DIMEGLUMINE 529 MG/ML IV SOLN COMPARISON:  CT chest abdomen and pelvis 05/05/2017. FINDINGS: Segmentation:  Normal as demonstrated on the comparison CT. Alignment: Stable overall size vertebral alignment since 2018. Chronic straightening and mild reversal of lumbar lordosis in the setting of mild to moderate dextroconvex lumbar scoliosis. Chronic mild grade 1 anterolisthesis at L3-L4 and L4-L5. Chronic mild retrolisthesis at L1-L2. Vertebrae: Chronic T12 and L1 compression fractures. However, there is trace marrow edema and enhancement along the superior T12 endplate (series 4, image 11). There is a mild chronic inferior endplate deformity at L3. Chronic L4 superior endplate compression, but new L4 inferior endplate compression since 2018 with confluent marrow edema and enhancement throughout most of the L4 vertebral body. The L4 pedicles and posterior elements remain intact. Minimal retropulsion of the posterior inferior endplate. The L2 and L5 levels are intact. Intact visible sacrum and SI joints. Underlying normal bone marrow signal. Conus medullaris and cauda equina: Conus extends to the L1-L2 level. No lower spinal cord or conus signal abnormality. No abnormal intradural enhancement.  Paraspinal and other soft tissues: The visible paraspinal soft tissues are within normal limits. Negative visible abdominal viscera. Disc levels: No lower thoracic spinal stenosis. T12-L1: Disc space loss and vacuum disc. Mild right T12 foraminal stenosis. L1-L2: Disc space loss with circumferential disc bulge.  Broad-based posterior and biforaminal involvement with borderline to mild spinal stenosis, and mild to moderate bilateral L1 foraminal stenosis. L2-L3: Disc space loss with chronic vacuum disc. Circumferential disc bulge with broad-based posterior and biforaminal involvement. Mild facet and ligament flavum hypertrophy. Borderline to mild spinal stenosis. Moderate left and mild right L2 neural foraminal stenosis. L3-L4: Severe disc space loss with vacuum disc. Grade 1 anterolisthesis. Circumferential disc osteophyte complex with broad-based posterior and biforaminal involvement. Severe left and mild to moderate right L3 neural foraminal stenosis. Borderline to mild spinal and left lateral recess stenosis (left L4 nerve level). L4-L5: Circumferential disc bulge. Moderate to severe facet and mild to moderate ligament flavum hypertrophy. Moderate left greater than right L4 neural foraminal stenosis. Borderline to mild spinal stenosis. L5-S1: Circumferential disc bulge with broad-based and biforaminal involvement. Moderate facet and ligament flavum hypertrophy. No spinal or lateral recess stenosis. Mild to moderate left and moderate right L5 foraminal stenosis. IMPRESSION: 1. Acute to subacute L4 compression fracture with mild deformity of the inferior endplate. No complicating features. 2. Underlying chronic lower thoracic and lumbar compression fractures. Suspicion of mild acute on chronic compression of the T12 superior endplate. 3. Widespread advanced lumbar disc and endplate degeneration. Multilevel mild spinal stenosis. Up to severe left neural foraminal stenosis at the L3 nerve level. Multilevel moderate  neural foraminal stenosis elsewhere. Electronically Signed   By: Genevie Ann M.D.   On: 01/21/2018 12:27    ____________________________________________   PROCEDURES  Procedure(s) performed:   Procedures   ____________________________________________   INITIAL IMPRESSION / ASSESSMENT AND PLAN / ED COURSE  As her previous back surgeon was Dr. Earle Gell from [facility] in [place] so we will contact them for recommendations.  I will start with steroids, pain medicine and a TLSO brace pending their recommendations.  Discussed with Dr. Arnoldo Morale, agrees with TLSO brace, neurontin, steroids and breakthrough pain meds. Agrees to see in the office in follow up, expecting patient to call to make appointment.   Pertinent labs & imaging results that were available during my care of the patient were reviewed by me and considered in my medical decision making (see chart for details).  ____________________________________________  FINAL CLINICAL IMPRESSION(S) / ED DIAGNOSES  Final diagnoses:  Closed compression fracture of body of lumbar vertebra (HCC)     MEDICATIONS GIVEN DURING THIS VISIT:  Medications  methylPREDNISolone sodium succinate (SOLU-MEDROL) 125 mg/2 mL injection 125 mg (125 mg Intramuscular Given 01/21/18 1614)  morphine 4 MG/ML injection 4 mg (4 mg Intramuscular Given 01/21/18 1614)     NEW OUTPATIENT MEDICATIONS STARTED DURING THIS VISIT:  New Prescriptions   DOCUSATE SODIUM (COLACE) 100 MG CAPSULE    Take 1 capsule (100 mg total) by mouth every 12 (twelve) hours. While taking oxycodone   GABAPENTIN (NEURONTIN) 100 MG CAPSULE    Take 1 capsule (100 mg total) by mouth 3 (three) times daily.   OXYCODONE (ROXICODONE) 5 MG IMMEDIATE RELEASE TABLET    Take 1-2 tablets (5-10 mg total) by mouth every 6 (six) hours as needed for breakthrough pain.   PREDNISONE (DELTASONE) 20 MG TABLET    3 tabs po daily x 3 days, then 2 tabs x 3 days, then 1.5 tabs x 3 days, then 1  tab x 3 days, then 0.5 tabs x 3 days    Note:  This note was prepared with assistance of Dragon voice recognition software. Occasional wrong-word or sound-a-like substitutions may have occurred due to the inherent limitations of voice recognition software.  Merrily Pew, MD 01/21/18 [phone]

## 2018-01-21 NOTE — ED Notes (Signed)
PT had outpatient MRI done this morning and director at [facility]) gave orders to send to ED for eval due to new L4 compression fracture. PT states relief from pain from 2 oxycodone 5mg  given at 1130 today prior to ED arrival.

## 2018-01-21 NOTE — ED Notes (Signed)
[employer] called to come from [place] to fit pt for the TLSO brace.

## 2018-01-21 NOTE — ED Triage Notes (Signed)
From Dr Judi Cong Office for L4 com presser fracture.  C/o pain.  Given 2 Oxycodone 5 mg at 11 am.

## 2018-01-21 NOTE — ED Notes (Signed)
[employer] rep in room with pt.

## 2018-01-26 DIAGNOSIS — I10 Essential (primary) hypertension: Secondary | ICD-10-CM | POA: Diagnosis not present

## 2018-01-26 DIAGNOSIS — E039 Hypothyroidism, unspecified: Secondary | ICD-10-CM | POA: Diagnosis not present

## 2018-01-26 DIAGNOSIS — D509 Iron deficiency anemia, unspecified: Secondary | ICD-10-CM | POA: Diagnosis not present

## 2018-01-26 DIAGNOSIS — D649 Anemia, unspecified: Secondary | ICD-10-CM | POA: Diagnosis not present

## 2018-01-26 DIAGNOSIS — M6281 Muscle weakness (generalized): Secondary | ICD-10-CM | POA: Diagnosis not present

## 2018-01-26 DIAGNOSIS — R1312 Dysphagia, oropharyngeal phase: Secondary | ICD-10-CM | POA: Diagnosis not present

## 2018-01-26 DIAGNOSIS — S12000S Unspecified displaced fracture of first cervical vertebra, sequela: Secondary | ICD-10-CM | POA: Diagnosis not present

## 2018-01-26 DIAGNOSIS — S12100S Unspecified displaced fracture of second cervical vertebra, sequela: Secondary | ICD-10-CM | POA: Diagnosis not present

## 2018-01-27 DIAGNOSIS — S12100S Unspecified displaced fracture of second cervical vertebra, sequela: Secondary | ICD-10-CM | POA: Diagnosis not present

## 2018-01-27 DIAGNOSIS — M6281 Muscle weakness (generalized): Secondary | ICD-10-CM | POA: Diagnosis not present

## 2018-01-27 DIAGNOSIS — R1312 Dysphagia, oropharyngeal phase: Secondary | ICD-10-CM | POA: Diagnosis not present

## 2018-01-27 DIAGNOSIS — S12000S Unspecified displaced fracture of first cervical vertebra, sequela: Secondary | ICD-10-CM | POA: Diagnosis not present

## 2018-01-28 DIAGNOSIS — S12000S Unspecified displaced fracture of first cervical vertebra, sequela: Secondary | ICD-10-CM | POA: Diagnosis not present

## 2018-01-28 DIAGNOSIS — M6281 Muscle weakness (generalized): Secondary | ICD-10-CM | POA: Diagnosis not present

## 2018-01-28 DIAGNOSIS — R1312 Dysphagia, oropharyngeal phase: Secondary | ICD-10-CM | POA: Diagnosis not present

## 2018-01-28 DIAGNOSIS — S12100S Unspecified displaced fracture of second cervical vertebra, sequela: Secondary | ICD-10-CM | POA: Diagnosis not present

## 2018-01-29 DIAGNOSIS — R1312 Dysphagia, oropharyngeal phase: Secondary | ICD-10-CM | POA: Diagnosis not present

## 2018-01-29 DIAGNOSIS — S12000S Unspecified displaced fracture of first cervical vertebra, sequela: Secondary | ICD-10-CM | POA: Diagnosis not present

## 2018-01-29 DIAGNOSIS — S12100S Unspecified displaced fracture of second cervical vertebra, sequela: Secondary | ICD-10-CM | POA: Diagnosis not present

## 2018-01-29 DIAGNOSIS — M6281 Muscle weakness (generalized): Secondary | ICD-10-CM | POA: Diagnosis not present

## 2018-02-01 DIAGNOSIS — M6281 Muscle weakness (generalized): Secondary | ICD-10-CM | POA: Diagnosis not present

## 2018-02-01 DIAGNOSIS — S12000S Unspecified displaced fracture of first cervical vertebra, sequela: Secondary | ICD-10-CM | POA: Diagnosis not present

## 2018-02-01 DIAGNOSIS — R1312 Dysphagia, oropharyngeal phase: Secondary | ICD-10-CM | POA: Diagnosis not present

## 2018-02-01 DIAGNOSIS — S12100S Unspecified displaced fracture of second cervical vertebra, sequela: Secondary | ICD-10-CM | POA: Diagnosis not present

## 2018-02-02 DIAGNOSIS — S12000S Unspecified displaced fracture of first cervical vertebra, sequela: Secondary | ICD-10-CM | POA: Diagnosis not present

## 2018-02-02 DIAGNOSIS — S12100S Unspecified displaced fracture of second cervical vertebra, sequela: Secondary | ICD-10-CM | POA: Diagnosis not present

## 2018-02-02 DIAGNOSIS — M6281 Muscle weakness (generalized): Secondary | ICD-10-CM | POA: Diagnosis not present

## 2018-02-02 DIAGNOSIS — R1312 Dysphagia, oropharyngeal phase: Secondary | ICD-10-CM | POA: Diagnosis not present

## 2018-02-03 DIAGNOSIS — S12100S Unspecified displaced fracture of second cervical vertebra, sequela: Secondary | ICD-10-CM | POA: Diagnosis not present

## 2018-02-03 DIAGNOSIS — S12000S Unspecified displaced fracture of first cervical vertebra, sequela: Secondary | ICD-10-CM | POA: Diagnosis not present

## 2018-02-03 DIAGNOSIS — M6281 Muscle weakness (generalized): Secondary | ICD-10-CM | POA: Diagnosis not present

## 2018-02-03 DIAGNOSIS — L750 Bromhidrosis: Secondary | ICD-10-CM | POA: Diagnosis not present

## 2018-02-03 DIAGNOSIS — R1312 Dysphagia, oropharyngeal phase: Secondary | ICD-10-CM | POA: Diagnosis not present

## 2018-02-04 DIAGNOSIS — S12100S Unspecified displaced fracture of second cervical vertebra, sequela: Secondary | ICD-10-CM | POA: Diagnosis not present

## 2018-02-04 DIAGNOSIS — M6281 Muscle weakness (generalized): Secondary | ICD-10-CM | POA: Diagnosis not present

## 2018-02-04 DIAGNOSIS — M545 Low back pain: Secondary | ICD-10-CM | POA: Diagnosis not present

## 2018-02-04 DIAGNOSIS — S12000S Unspecified displaced fracture of first cervical vertebra, sequela: Secondary | ICD-10-CM | POA: Diagnosis not present

## 2018-02-04 DIAGNOSIS — N390 Urinary tract infection, site not specified: Secondary | ICD-10-CM | POA: Diagnosis not present

## 2018-02-04 DIAGNOSIS — Z79899 Other long term (current) drug therapy: Secondary | ICD-10-CM | POA: Diagnosis not present

## 2018-02-04 DIAGNOSIS — R319 Hematuria, unspecified: Secondary | ICD-10-CM | POA: Diagnosis not present

## 2018-02-04 DIAGNOSIS — S32040A Wedge compression fracture of fourth lumbar vertebra, initial encounter for closed fracture: Secondary | ICD-10-CM | POA: Diagnosis not present

## 2018-02-04 DIAGNOSIS — I10 Essential (primary) hypertension: Secondary | ICD-10-CM | POA: Diagnosis not present

## 2018-02-04 DIAGNOSIS — R1312 Dysphagia, oropharyngeal phase: Secondary | ICD-10-CM | POA: Diagnosis not present

## 2018-02-05 DIAGNOSIS — R1312 Dysphagia, oropharyngeal phase: Secondary | ICD-10-CM | POA: Diagnosis not present

## 2018-02-05 DIAGNOSIS — S12100S Unspecified displaced fracture of second cervical vertebra, sequela: Secondary | ICD-10-CM | POA: Diagnosis not present

## 2018-02-05 DIAGNOSIS — S12000S Unspecified displaced fracture of first cervical vertebra, sequela: Secondary | ICD-10-CM | POA: Diagnosis not present

## 2018-02-05 DIAGNOSIS — M6281 Muscle weakness (generalized): Secondary | ICD-10-CM | POA: Diagnosis not present

## 2018-02-08 DIAGNOSIS — S12100S Unspecified displaced fracture of second cervical vertebra, sequela: Secondary | ICD-10-CM | POA: Diagnosis not present

## 2018-02-08 DIAGNOSIS — S12000S Unspecified displaced fracture of first cervical vertebra, sequela: Secondary | ICD-10-CM | POA: Diagnosis not present

## 2018-02-08 DIAGNOSIS — R2681 Unsteadiness on feet: Secondary | ICD-10-CM | POA: Diagnosis not present

## 2018-02-08 DIAGNOSIS — R1312 Dysphagia, oropharyngeal phase: Secondary | ICD-10-CM | POA: Diagnosis not present

## 2018-02-08 DIAGNOSIS — R262 Difficulty in walking, not elsewhere classified: Secondary | ICD-10-CM | POA: Diagnosis not present

## 2018-02-08 DIAGNOSIS — M6281 Muscle weakness (generalized): Secondary | ICD-10-CM | POA: Diagnosis not present

## 2018-02-08 DIAGNOSIS — S32049S Unspecified fracture of fourth lumbar vertebra, sequela: Secondary | ICD-10-CM | POA: Diagnosis not present

## 2018-02-08 DIAGNOSIS — S14109S Unspecified injury at unspecified level of cervical spinal cord, sequela: Secondary | ICD-10-CM | POA: Diagnosis not present

## 2018-02-09 DIAGNOSIS — S12100S Unspecified displaced fracture of second cervical vertebra, sequela: Secondary | ICD-10-CM | POA: Diagnosis not present

## 2018-02-09 DIAGNOSIS — R262 Difficulty in walking, not elsewhere classified: Secondary | ICD-10-CM | POA: Diagnosis not present

## 2018-02-09 DIAGNOSIS — R2681 Unsteadiness on feet: Secondary | ICD-10-CM | POA: Diagnosis not present

## 2018-02-09 DIAGNOSIS — S14109S Unspecified injury at unspecified level of cervical spinal cord, sequela: Secondary | ICD-10-CM | POA: Diagnosis not present

## 2018-02-09 DIAGNOSIS — M6281 Muscle weakness (generalized): Secondary | ICD-10-CM | POA: Diagnosis not present

## 2018-02-09 DIAGNOSIS — R1312 Dysphagia, oropharyngeal phase: Secondary | ICD-10-CM | POA: Diagnosis not present

## 2018-02-09 DIAGNOSIS — S12000S Unspecified displaced fracture of first cervical vertebra, sequela: Secondary | ICD-10-CM | POA: Diagnosis not present

## 2018-02-09 DIAGNOSIS — S32049S Unspecified fracture of fourth lumbar vertebra, sequela: Secondary | ICD-10-CM | POA: Diagnosis not present

## 2018-02-10 ENCOUNTER — Other Ambulatory Visit: Payer: Self-pay | Admitting: Neurosurgery

## 2018-02-10 DIAGNOSIS — M6281 Muscle weakness (generalized): Secondary | ICD-10-CM | POA: Diagnosis not present

## 2018-02-10 DIAGNOSIS — R262 Difficulty in walking, not elsewhere classified: Secondary | ICD-10-CM | POA: Diagnosis not present

## 2018-02-10 DIAGNOSIS — S32049S Unspecified fracture of fourth lumbar vertebra, sequela: Secondary | ICD-10-CM | POA: Diagnosis not present

## 2018-02-10 DIAGNOSIS — S12000S Unspecified displaced fracture of first cervical vertebra, sequela: Secondary | ICD-10-CM | POA: Diagnosis not present

## 2018-02-10 DIAGNOSIS — S14109S Unspecified injury at unspecified level of cervical spinal cord, sequela: Secondary | ICD-10-CM | POA: Diagnosis not present

## 2018-02-10 DIAGNOSIS — R2681 Unsteadiness on feet: Secondary | ICD-10-CM | POA: Diagnosis not present

## 2018-02-10 DIAGNOSIS — S12100S Unspecified displaced fracture of second cervical vertebra, sequela: Secondary | ICD-10-CM | POA: Diagnosis not present

## 2018-02-10 DIAGNOSIS — R1312 Dysphagia, oropharyngeal phase: Secondary | ICD-10-CM | POA: Diagnosis not present

## 2018-02-12 ENCOUNTER — Other Ambulatory Visit: Payer: Self-pay | Admitting: Neurosurgery

## 2018-02-12 DIAGNOSIS — R2681 Unsteadiness on feet: Secondary | ICD-10-CM | POA: Diagnosis not present

## 2018-02-12 DIAGNOSIS — M6281 Muscle weakness (generalized): Secondary | ICD-10-CM | POA: Diagnosis not present

## 2018-02-12 DIAGNOSIS — S14109S Unspecified injury at unspecified level of cervical spinal cord, sequela: Secondary | ICD-10-CM | POA: Diagnosis not present

## 2018-02-12 DIAGNOSIS — S12000S Unspecified displaced fracture of first cervical vertebra, sequela: Secondary | ICD-10-CM | POA: Diagnosis not present

## 2018-02-12 DIAGNOSIS — S12100S Unspecified displaced fracture of second cervical vertebra, sequela: Secondary | ICD-10-CM | POA: Diagnosis not present

## 2018-02-12 DIAGNOSIS — R1312 Dysphagia, oropharyngeal phase: Secondary | ICD-10-CM | POA: Diagnosis not present

## 2018-02-12 DIAGNOSIS — R262 Difficulty in walking, not elsewhere classified: Secondary | ICD-10-CM | POA: Diagnosis not present

## 2018-02-12 DIAGNOSIS — S32049S Unspecified fracture of fourth lumbar vertebra, sequela: Secondary | ICD-10-CM | POA: Diagnosis not present

## 2018-02-15 DIAGNOSIS — S32049S Unspecified fracture of fourth lumbar vertebra, sequela: Secondary | ICD-10-CM | POA: Diagnosis not present

## 2018-02-15 DIAGNOSIS — M6281 Muscle weakness (generalized): Secondary | ICD-10-CM | POA: Diagnosis not present

## 2018-02-15 DIAGNOSIS — R1312 Dysphagia, oropharyngeal phase: Secondary | ICD-10-CM | POA: Diagnosis not present

## 2018-02-15 DIAGNOSIS — S14109S Unspecified injury at unspecified level of cervical spinal cord, sequela: Secondary | ICD-10-CM | POA: Diagnosis not present

## 2018-02-15 DIAGNOSIS — R2681 Unsteadiness on feet: Secondary | ICD-10-CM | POA: Diagnosis not present

## 2018-02-15 DIAGNOSIS — S12100S Unspecified displaced fracture of second cervical vertebra, sequela: Secondary | ICD-10-CM | POA: Diagnosis not present

## 2018-02-15 DIAGNOSIS — S12000S Unspecified displaced fracture of first cervical vertebra, sequela: Secondary | ICD-10-CM | POA: Diagnosis not present

## 2018-02-15 DIAGNOSIS — R262 Difficulty in walking, not elsewhere classified: Secondary | ICD-10-CM | POA: Diagnosis not present

## 2018-02-16 DIAGNOSIS — S12000S Unspecified displaced fracture of first cervical vertebra, sequela: Secondary | ICD-10-CM | POA: Diagnosis not present

## 2018-02-16 DIAGNOSIS — S32049S Unspecified fracture of fourth lumbar vertebra, sequela: Secondary | ICD-10-CM | POA: Diagnosis not present

## 2018-02-16 DIAGNOSIS — S14109S Unspecified injury at unspecified level of cervical spinal cord, sequela: Secondary | ICD-10-CM | POA: Diagnosis not present

## 2018-02-16 DIAGNOSIS — R1312 Dysphagia, oropharyngeal phase: Secondary | ICD-10-CM | POA: Diagnosis not present

## 2018-02-16 DIAGNOSIS — R262 Difficulty in walking, not elsewhere classified: Secondary | ICD-10-CM | POA: Diagnosis not present

## 2018-02-16 DIAGNOSIS — S12100S Unspecified displaced fracture of second cervical vertebra, sequela: Secondary | ICD-10-CM | POA: Diagnosis not present

## 2018-02-16 DIAGNOSIS — R2681 Unsteadiness on feet: Secondary | ICD-10-CM | POA: Diagnosis not present

## 2018-02-16 DIAGNOSIS — M6281 Muscle weakness (generalized): Secondary | ICD-10-CM | POA: Diagnosis not present

## 2018-02-17 DIAGNOSIS — S12100S Unspecified displaced fracture of second cervical vertebra, sequela: Secondary | ICD-10-CM | POA: Diagnosis not present

## 2018-02-17 DIAGNOSIS — M6281 Muscle weakness (generalized): Secondary | ICD-10-CM | POA: Diagnosis not present

## 2018-02-17 DIAGNOSIS — B351 Tinea unguium: Secondary | ICD-10-CM | POA: Diagnosis not present

## 2018-02-17 DIAGNOSIS — R262 Difficulty in walking, not elsewhere classified: Secondary | ICD-10-CM | POA: Diagnosis not present

## 2018-02-17 DIAGNOSIS — R1312 Dysphagia, oropharyngeal phase: Secondary | ICD-10-CM | POA: Diagnosis not present

## 2018-02-17 DIAGNOSIS — M79674 Pain in right toe(s): Secondary | ICD-10-CM | POA: Diagnosis not present

## 2018-02-17 DIAGNOSIS — R2681 Unsteadiness on feet: Secondary | ICD-10-CM | POA: Diagnosis not present

## 2018-02-17 DIAGNOSIS — S12000S Unspecified displaced fracture of first cervical vertebra, sequela: Secondary | ICD-10-CM | POA: Diagnosis not present

## 2018-02-17 DIAGNOSIS — S14109S Unspecified injury at unspecified level of cervical spinal cord, sequela: Secondary | ICD-10-CM | POA: Diagnosis not present

## 2018-02-17 DIAGNOSIS — S32049S Unspecified fracture of fourth lumbar vertebra, sequela: Secondary | ICD-10-CM | POA: Diagnosis not present

## 2018-02-18 DIAGNOSIS — S14109S Unspecified injury at unspecified level of cervical spinal cord, sequela: Secondary | ICD-10-CM | POA: Diagnosis not present

## 2018-02-18 DIAGNOSIS — M6281 Muscle weakness (generalized): Secondary | ICD-10-CM | POA: Diagnosis not present

## 2018-02-18 DIAGNOSIS — S12000S Unspecified displaced fracture of first cervical vertebra, sequela: Secondary | ICD-10-CM | POA: Diagnosis not present

## 2018-02-18 DIAGNOSIS — R1312 Dysphagia, oropharyngeal phase: Secondary | ICD-10-CM | POA: Diagnosis not present

## 2018-02-18 DIAGNOSIS — S12100S Unspecified displaced fracture of second cervical vertebra, sequela: Secondary | ICD-10-CM | POA: Diagnosis not present

## 2018-02-18 DIAGNOSIS — S32049S Unspecified fracture of fourth lumbar vertebra, sequela: Secondary | ICD-10-CM | POA: Diagnosis not present

## 2018-02-18 DIAGNOSIS — R262 Difficulty in walking, not elsewhere classified: Secondary | ICD-10-CM | POA: Diagnosis not present

## 2018-02-18 DIAGNOSIS — R2681 Unsteadiness on feet: Secondary | ICD-10-CM | POA: Diagnosis not present

## 2018-02-18 NOTE — Pre-Procedure Instructions (Signed)
Jordan [name]  02/18/2018      [facility] - [place], [place] [address] Phone: [phone] Fax: [phone]    Your procedure is scheduled on   Tuesday 02/23/18  Report to [facility] Admitting at 530 A.M.  Call this number if you have problems the morning of surgery:  [phone]   Remember:  Do not eat or drink after midnight.     Take these medicines the morning of surgery with A SIP OF WATER-  TYLENOL OR HYDROCODONE, IF NEEDED, SYMBICORT INHALER (BRING WITH YOU) , FLUOXETINE, GABAPENTIN, DUONEB, SINGULAIR, PANTOPRAZOLE, PREDNISONE   7 days prior to surgery STOP taking any Aspirin(unless otherwise instructed by your surgeon), Aleve, Naproxen, DICLOFENAC/ VOLTAREN,  Ibuprofen, Motrin, Advil, Goody's, BC's, all herbal medications, fish oil, and all vitamins   Do not wear jewelry, make-up or nail polish.  Do not wear lotions, powders, or perfumes, or deodorant.  Do not shave 48 hours prior to surgery.  Men may shave face and neck.  Do not bring valuables to the hospital.  [facility] is not responsible for any belongings or valuables.  Contacts, dentures or bridgework may not be worn into surgery.  Leave your suitcase in the car.  After surgery it may be brought to your room.  For patients admitted to the hospital, discharge time will be determined by your treatment team.  Patients discharged the day of surgery will not be allowed to drive home.   Name and phone number of your driver:    Special instructions:  [place] - Preparing for Surgery  Before surgery, you can play an important role.  Because skin is not sterile, your skin needs to be as free of germs as possible.  You can reduce the number of germs on you skin by washing with CHG (chlorahexidine gluconate) soap before surgery.  CHG is an antiseptic cleaner which kills germs and bonds with the skin to continue killing germs even after washing.  Oral  Hygiene is also important in reducing the risk of infection.  Remember to brush your teeth with your regular toothpaste the morning of surgery.  Please DO NOT use if you have an allergy to CHG or antibacterial soaps.  If your skin becomes reddened/irritated stop using the CHG and inform your nurse when you arrive at Short Stay.  Do not shave (including legs and underarms) for at least 48 hours prior to the first CHG shower.  You may shave your face.  Please follow these instructions carefully:   1.  Shower with CHG Soap the night before surgery and the morning of Surgery.  2.  If you choose to wash your hair, wash your hair first as usual with your normal shampoo.  3.  After you shampoo, rinse your hair and body thoroughly to remove the shampoo. 4.  Use CHG as you would any other liquid soap.  You can apply chg directly to the skin and wash gently with a      scrungie or washcloth.           5.  Apply the CHG Soap to your body ONLY FROM THE NECK DOWN.   Do not use on open wounds or open sores. Avoid contact with your eyes, ears, mouth and genitals (private parts).  Wash genitals (private parts) with your normal soap.  6.  Wash thoroughly, paying special attention to the area where your surgery will be  performed.  7.  Thoroughly rinse your body with warm water from the neck down.  8.  DO NOT shower/wash with your normal soap after using and rinsing off the CHG Soap.  9.  Pat yourself dry with a clean towel.            10.  Wear clean pajamas.            11.  Place clean sheets on your bed the night of your first shower and do not sleep with pets.  Day of Surgery  Do not apply any lotions/deoderants the morning of surgery.   Please wear clean clothes to the hospital/surgery center. Remember to brush your teeth with toothpaste.     Please read over the following fact sheets that you were given. MRSA Information and Surgical Site Infection Prevention

## 2018-02-19 ENCOUNTER — Other Ambulatory Visit: Payer: Self-pay

## 2018-02-19 ENCOUNTER — Encounter (HOSPITAL_COMMUNITY)
Admission: RE | Admit: 2018-02-19 | Discharge: 2018-02-19 | Disposition: A | Discharge status: Home / Self Care | Payer: Medicare Other | Source: Ambulatory Visit | Attending: Neurosurgery | Admitting: Neurosurgery

## 2018-02-19 ENCOUNTER — Encounter (HOSPITAL_COMMUNITY): Payer: Self-pay

## 2018-02-19 DIAGNOSIS — R262 Difficulty in walking, not elsewhere classified: Secondary | ICD-10-CM | POA: Diagnosis not present

## 2018-02-19 DIAGNOSIS — I700 Atherosclerosis of aorta: Secondary | ICD-10-CM | POA: Insufficient documentation

## 2018-02-19 DIAGNOSIS — S12100S Unspecified displaced fracture of second cervical vertebra, sequela: Secondary | ICD-10-CM | POA: Diagnosis not present

## 2018-02-19 DIAGNOSIS — R1312 Dysphagia, oropharyngeal phase: Secondary | ICD-10-CM | POA: Diagnosis not present

## 2018-02-19 DIAGNOSIS — R2681 Unsteadiness on feet: Secondary | ICD-10-CM | POA: Diagnosis not present

## 2018-02-19 DIAGNOSIS — S32049A Unspecified fracture of fourth lumbar vertebra, initial encounter for closed fracture: Secondary | ICD-10-CM | POA: Insufficient documentation

## 2018-02-19 DIAGNOSIS — S14109S Unspecified injury at unspecified level of cervical spinal cord, sequela: Secondary | ICD-10-CM | POA: Diagnosis not present

## 2018-02-19 DIAGNOSIS — Z01812 Encounter for preprocedural laboratory examination: Secondary | ICD-10-CM | POA: Insufficient documentation

## 2018-02-19 DIAGNOSIS — M6281 Muscle weakness (generalized): Secondary | ICD-10-CM | POA: Diagnosis not present

## 2018-02-19 DIAGNOSIS — S32049S Unspecified fracture of fourth lumbar vertebra, sequela: Secondary | ICD-10-CM | POA: Diagnosis not present

## 2018-02-19 DIAGNOSIS — S12000S Unspecified displaced fracture of first cervical vertebra, sequela: Secondary | ICD-10-CM | POA: Diagnosis not present

## 2018-02-19 HISTORY — DX: Neuromuscular dysfunction of bladder, unspecified: N31.9

## 2018-02-19 HISTORY — DX: Dysphasia: R47.02

## 2018-02-19 HISTORY — DX: Repeated falls: R29.6

## 2018-02-19 HISTORY — DX: Unspecified fall, initial encounter: W19.XXXA

## 2018-02-19 HISTORY — DX: Neurogenic bowel, not elsewhere classified: K59.2

## 2018-02-19 LAB — BASIC METABOLIC PANEL
Anion gap: 6 (ref 5–15)
BUN: 27 mg/dL — ABNORMAL HIGH (ref 6–20)
CO2: 22 mmol/L (ref 22–32)
Calcium: 9.4 mg/dL (ref 8.9–10.3)
Chloride: 113 mmol/L — ABNORMAL HIGH (ref 101–111)
Creatinine, Ser: 1.67 mg/dL — ABNORMAL HIGH (ref 0.44–1.00)
GFR calc Af Amer: 34 mL/min — ABNORMAL LOW (ref >60)
GFR calc non Af Amer: 30 mL/min — ABNORMAL LOW (ref >60)
Glucose, Bld: 106 mg/dL — ABNORMAL HIGH (ref 65–99)
Potassium: 4.9 mmol/L (ref 3.5–5.1)
Sodium: 141 mmol/L (ref 135–145)

## 2018-02-19 LAB — CBC
HCT: 30.3 % — ABNORMAL LOW (ref 36.0–46.0)
Hemoglobin: 9.1 g/dL — ABNORMAL LOW (ref 12.0–15.0)
MCH: 30.3 pg (ref 26.0–34.0)
MCHC: 30 g/dL (ref 30.0–36.0)
MCV: 101 fL — ABNORMAL HIGH (ref 78.0–100.0)
Platelets: 250 10*3/uL (ref 150–400)
RBC: 3 MIL/uL — ABNORMAL LOW (ref 3.87–5.11)
RDW: 13.5 % (ref 11.5–15.5)
WBC: 9.6 10*3/uL (ref 4.0–10.5)

## 2018-02-19 LAB — SURGICAL PCR SCREEN
MRSA, PCR: NEGATIVE
Staphylococcus aureus: NEGATIVE

## 2018-02-19 NOTE — Progress Notes (Signed)
PCP -Dr. Sinda Du   Cardiologist - patient denies  Chest x-ray - N/A EKG - 05/05/17 Stress Test - 2017 ECHO - 2010 Cardiac Cath - 10+ years ago  Sleep Study - pt reports sleep study done in past; results negative.    Blood Thinner Instructions: N/A Aspirin Instructions:N/A   Anesthesia review: Yes.  Patient denies shortness of breath, fever, cough and chest pain at PAT appointment   Patient verbalized understanding of instructions that were given to them at the PAT appointment. Patient was also instructed that they will need to review over the PAT instructions again at home before surgery.   Jacqlyn Larsen, RN

## 2018-02-22 DIAGNOSIS — M6281 Muscle weakness (generalized): Secondary | ICD-10-CM | POA: Diagnosis not present

## 2018-02-22 DIAGNOSIS — R1312 Dysphagia, oropharyngeal phase: Secondary | ICD-10-CM | POA: Diagnosis not present

## 2018-02-22 DIAGNOSIS — S14109S Unspecified injury at unspecified level of cervical spinal cord, sequela: Secondary | ICD-10-CM | POA: Diagnosis not present

## 2018-02-22 DIAGNOSIS — S12100S Unspecified displaced fracture of second cervical vertebra, sequela: Secondary | ICD-10-CM | POA: Diagnosis not present

## 2018-02-22 DIAGNOSIS — R262 Difficulty in walking, not elsewhere classified: Secondary | ICD-10-CM | POA: Diagnosis not present

## 2018-02-22 DIAGNOSIS — R2681 Unsteadiness on feet: Secondary | ICD-10-CM | POA: Diagnosis not present

## 2018-02-22 DIAGNOSIS — S12000S Unspecified displaced fracture of first cervical vertebra, sequela: Secondary | ICD-10-CM | POA: Diagnosis not present

## 2018-02-22 DIAGNOSIS — S32049S Unspecified fracture of fourth lumbar vertebra, sequela: Secondary | ICD-10-CM | POA: Diagnosis not present

## 2018-02-22 NOTE — Anesthesia Preprocedure Evaluation (Addendum)
Anesthesia Evaluation  Patient identified by MRN, date of birth, ID band Patient awake    Reviewed: Allergy & Precautions, H&P , NPO status , Patient's Chart, lab work & pertinent test results  Airway Mallampati: II  TM Distance: >3 FB Neck ROM: Full    Dental no notable dental hx. (+) Teeth Intact, Dental Advisory Given   Pulmonary asthma , COPD,  COPD inhaler,    Pulmonary exam normal breath sounds clear to auscultation       Cardiovascular Exercise Tolerance: Good hypertension, Pt. on medications + Past MI and + Peripheral Vascular Disease   Rhythm:Regular Rate:Normal     Neuro/Psych Depression CVA, No Residual Symptoms    GI/Hepatic Neg liver ROS, hiatal hernia,   Endo/Other  negative endocrine ROS  Renal/GU negative Renal ROS  negative genitourinary   Musculoskeletal  (+) Arthritis , Osteoarthritis,    Abdominal   Peds  Hematology  (+) anemia ,   Anesthesia Other Findings   Reproductive/Obstetrics negative OB ROS                            Anesthesia Physical Anesthesia Plan  ASA: III  Anesthesia Plan: General   Post-op Pain Management:    Induction: Intravenous  PONV Risk Score and Plan: 4 or greater and Ondansetron, Dexamethasone and Treatment may vary due to age or medical condition  Airway Management Planned: Oral ETT  Additional Equipment:   Intra-op Plan:   Post-operative Plan: Extubation in OR  Informed Consent: I have reviewed the patients History and Physical, chart, labs and discussed the procedure including the risks, benefits and alternatives for the proposed anesthesia with the patient or authorized representative who has indicated his/her understanding and acceptance.   Dental advisory given  Plan Discussed with: CRNA  Anesthesia Plan Comments:         Anesthesia Quick Evaluation

## 2018-02-22 NOTE — Progress Notes (Signed)
Anesthesia Chart Review:   Case:  [id number] Date/Time:  02/23/18 0715   Procedure:  KYPHOPLASTY LUMBAR 4 (N/A ) - KYPHOPLASTY LUMBAR 4   Anesthesia type:  General   Pre-op diagnosis:  COMPRESSION FRACTURE OF LUMBAR 4 VERTEBRA, INITIAL ENCOUNTER   Location:  [facility] OR ROOM 20 / [place] [facility]   Surgeon:  Newman Pies, MD      DISCUSSION: - Pt is a 72 year old female with hx Takotsubo cardiomyopathy (2007), HTN, COPD, asthma, CKD, anemia  - Hospitalized 8/28/-05/20/17 at [facility] for fall resulting in displaced fracture of first cervical vertebra, C2 cervical fracture, C1-C4 level spinal cord injury. Underwent C1-C4 fusion with C1 laminectomy 05/06/17.  Discharged to rehab   - Hgb 9.1; this is consistent with prior results.  Pt has hx anemia, sees hematology at [facility]. Anemia is likely due to anemia of chronic disease/renal insufficiency.  Treatment options have been discussed but I do not see that any have been initiated yet as pt is asymptomatic.    VS: BP (!) 128/55   Pulse 77   Temp 37 C (Oral)   Resp 18   Ht 5\' 3"  (1.6 m)   Wt 112 lb (50.8 kg)   SpO2 99%   BMI 19.84 kg/m    PROVIDERS: PCP is  Sinda Du, MD Hematologist is Hart Robinsons, MD (notes in care everywhere)  Has seen cardiology in the past for Takotsubo cardiomyopathy; last office visit 05/23/09 with Rozann Lesches, MD  LABS:  - H/H 9.1/30.3. This is consistent with prior results. Will get T&S day of surgery - Cr 1.67, BUN 27. Pt has CKD.  Baseline Cr ~1.4  (all labs ordered are listed, but only abnormal results are displayed)  Labs Reviewed  CBC - Abnormal; Notable for the following components:      Result Value   RBC 3.00 (*)    Hemoglobin 9.1 (*)    HCT 30.3 (*)    MCV 101.0 (*)    All other components within normal limits  BASIC METABOLIC PANEL - Abnormal; Notable for the following components:   Chloride 113 (*)    Glucose, Bld 106 (*)    BUN 27 (*)    Creatinine, Ser 1.67 (*)    GFR calc non Af Amer 30 (*)     GFR calc Af Amer 34 (*)    All other components within normal limits  SURGICAL PCR SCREEN     IMAGES:  CXR 05/21/17 (care everywhere): no significant radiographic evidence of acute cardiopulmonary disease  CT chest 05/05/17:  1. Mild to moderate anterior compression of T12, probably subacute to chronic. Multiple old healed rib fracture deformities. No conclusive acute fracture. 2. No evidence of vascular injury in the chest, abdomen or pelvis. No evidence of parenchymal organ injury. No pneumothorax, hemothorax, peritoneal blood or free air. 3. Moderate aortic atherosclerosis. 4. Mild hepatic steatosis.   EKG 05/05/18: Sinus rhythm. Consider LA enlargement. Incomplete RBBB. Nonspecific repol abnormality, lateral leads   CV:  Carotid duplex 01/09/17:  - Patent R CEA site with evidence of mild hyperplasia in the surgical site.  - L ICA velocities are consistent with a 1-39% stenosis (high end of range).  - No significant changes noted when compared to previous exam.   Nuclear stress test 12/04/15:   Nuclear stress EF: 72%.  There was no ST segment deviation noted during stress.  The study is normal.  This is a low risk study.  The left ventricular ejection fraction is hyperdynamic (>  65%).  Echo 05/11/09:  - Left ventricle: The cavity size was normal. Wall thickness was normal. Systolic function was normal. The estimated ejection fraction was in the range of 60% to 65%. Wall motion was normal; there were no regional wall motion abnormalities.  By notes, she had normal coronaries by 2007 cath in the setting of Takotsubo cardiomyopathy   Past Medical History:  Diagnosis Date  . Allergy    seasonal  . Anemia   . Arthritis   . Asthma    uses Advair and Spiriva daily,Albuterol prn.Takes Singulair at bedtime and Prednisone daily  . Back spasm    takes Zanaflex daily as needed  . Blood transfusion 2007   no abnormal reation noted   . Bruises easily    d/t being on Prednisone   . Bursitis    left elbow  . C. difficile colitis    2007: treated with Flagyl and Vanc, 2014: treated with vanc, Aug 2014: Vanc taper  . Chronic back pain   . Chronic kidney disease    "creatine" creeping up - followed at this time by PCP  . Complication of anesthesia 2000   woke up during one surgery- ovary surgery  . COPD (chronic obstructive pulmonary disease) ([place])   . Depression    takes Prozac daily  . Dysphasia   . Falls   . H/O hiatal hernia   . Hemorrhoids   . History of bronchitis    last time a yr ago  . History of colon polyps   . History of kidney stones   . History of kidney stones   . Hyperlipidemia    hx of-was on meds but has been off x 1 1/2 yrs  . Hypertension    takes Verapamil nightly  . IBS (irritable bowel syndrome)   . Insomnia    takes Benadryl as needed  . Myocardial infarction [facility]) 2007   Takotsubo cardiomyopathy  . Neurogenic bowel   . Neuromuscular dysfunction of bladder, unspecified   . Nocturia   . Osteoporosis    takes Fosamax weekly  . Pneumonia    MRSA pneumonia in 2007  . Shortness of breath    with exertion daily  . Stroke [facility])    "they say I has a small stroke".No deficits  . Tingling    and pain in left leg  . Urinary frequency     Past Surgical History:  Procedure Laterality Date  . APPENDECTOMY    . BIOPSY  04/13/2017   Procedure: BIOPSY;  Surgeon: Daneil Dolin, MD;  Location: [facility] SU[facility];  Service: Endoscopy;;  right and left colon   . CARPAL TUNNEL RELEASE Left   . CARPAL TUNNEL RELEASE Right 09/21/2015   Procedure: CARPAL TUNNEL RELEASE;  Surgeon: Carole Civil, MD;  Location: [facility] ORS;  Service: Orthopedics;  Laterality: Right;  . cataract surgery Bilateral   . CHOLECYSTECTOMY    . COLONOSCOPY   09/24/2006   RMR: Normal rectum, left-sided diverticula  . COLONOSCOPY  Oct 2012   Dr. Henrene Pastor: tubular adenomas, moderative diverticulosis, internal hemorrhoids  . COLONOSCOPY WITH PROPOFOL N/A 04/13/2017    Procedure: COLONOSCOPY WITH PROPOFOL;  Surgeon: Daneil Dolin, MD;  Location: [facility];  Service: Endoscopy;  Laterality: N/A;  8:30am  . CYSTOSCOPY    . ENDARTERECTOMY Right 12/12/2015   Procedure: ENDARTERECTOMY RIGHT CAROTID, RESECTION OF REDUNDANT RIGHT COMMON CAROTID ARTERY WITH PRIMARY REANASTOMOSIS;  Surgeon: Mal Misty, MD;  Location: [place];  Service: Vascular;  Laterality: Right;  . ESOPHAGOGASTRODUODENOSCOPY  04/07/2006   EXM:DYJWLK esophagus s/p placement of Bravo pH probe, some surgical changes but not typical of fundoplication wrap, may have slipped  . GANGLION CYST EXCISION Right 09/21/2015   Procedure: RIGHT WRIST GANGLION CYST REMOVAL;  Surgeon: Carole Civil, MD;  Location: [facility] ORS;  Service: Orthopedics;  Laterality: Right;  . hemorrhoidal banding    . HERNIA REPAIR     umbilical  . KNEE ARTHROSCOPY Right   . LUMBAR LAMINECTOMY/DECOMPRESSION MICRODISCECTOMY Left 10/17/2013   Procedure: LUMBAR LAMINECTOMY/DECOMPRESSION MICRODISCECTOMY 1 LEVEL three/four;  Surgeon: Ophelia Charter, MD;  Location: [place] NEURO ORS;  Service: Neurosurgery;  Laterality: Left;  . NECK SURGERY     fusion-   . NISSEN FUNDOPLICATION     x 2  . OLECRANON BURSECTOMY Left 01/11/2014   Procedure: OLECRANON BURSECTOMY;  Surgeon: Carole Civil, MD;  Location: [facility] ORS;  Service: Orthopedics;  Laterality: Left;  . OOPHORECTOMY Right   . PATCH ANGIOPLASTY Right 12/12/2015   Procedure: PATCH ANGIOPLASTY RIGHT CAROTID ARTERY USING HEMASHIELD PLATINUM FINESSE PATCH;  Surgeon: Mal Misty, MD;  Location: [place];  Service: Vascular;  Laterality: Right;  . TONSILLECTOMY      MEDICATIONS: . acetaminophen (TYLENOL) 325 MG tablet  . budesonide-formoterol (SYMBICORT) 160-4.5 MCG/ACT inhaler  . diclofenac sodium (VOLTAREN) 1 % GEL  . dicyclomine (BENTYL) 20 MG tablet  . docusate sodium (COLACE) 100 MG capsule  . ferrous sulfate 325 (65 FE) MG tablet  . FLUoxetine (PROZAC) 40 MG capsule  . folic acid  (FOLVITE) 1 MG tablet  . gabapentin (NEURONTIN) 400 MG capsule  . HYDROcodone-acetaminophen (NORCO/VICODIN) 5-325 MG tablet  . ipratropium-albuterol (DUONEB) 0.5-2.5 (3) MG/3ML SOLN  . Lidocaine 4 % PTCH  . lisinopril (PRINIVIL,ZESTRIL) 20 MG tablet  . Melatonin 3 MG TABS  . mirtazapine (REMERON) 15 MG tablet  . montelukast (SINGULAIR) 10 MG tablet  . ondansetron (ZOFRAN-ODT) 4 MG disintegrating tablet  . oxyCODONE (ROXICODONE) 5 MG immediate release tablet  . OXYGEN  . pantoprazole (PROTONIX) 40 MG tablet  . polycarbophil (FIBERCON) 625 MG tablet  . predniSONE (DELTASONE) 5 MG tablet  . Probiotic Product (ALIGN) 4 MG CAPS  . thiamine 100 MG tablet  . tiZANidine (ZANAFLEX) 2 MG tablet  . vitamin C (ASCORBIC ACID) 500 MG tablet   No current facility-administered medications for this encounter.     If no changes, I anticipate pt can proceed with surgery as scheduled.   Willeen Cass, FNP-BC [facility]/Anesthesiology Phone: [phone] 02/22/2018 9:52 AM

## 2018-02-23 ENCOUNTER — Ambulatory Visit (HOSPITAL_COMMUNITY)
Admission: RE | Admit: 2018-02-23 | Discharge: 2018-02-24 | Disposition: A | Discharge status: Skilled Nursing Facility | Payer: Medicare Other | Source: Ambulatory Visit | Attending: Neurosurgery | Admitting: Neurosurgery

## 2018-02-23 ENCOUNTER — Encounter (HOSPITAL_COMMUNITY)
Admission: RE | Disposition: A | Discharge status: Skilled Nursing Facility | Payer: Self-pay | Source: Ambulatory Visit | Attending: Neurosurgery

## 2018-02-23 ENCOUNTER — Ambulatory Visit (HOSPITAL_COMMUNITY): Payer: Medicare Other | Admitting: Emergency Medicine

## 2018-02-23 ENCOUNTER — Ambulatory Visit (HOSPITAL_COMMUNITY): Payer: Medicare Other

## 2018-02-23 DIAGNOSIS — I252 Old myocardial infarction: Secondary | ICD-10-CM | POA: Insufficient documentation

## 2018-02-23 DIAGNOSIS — M545 Low back pain: Secondary | ICD-10-CM | POA: Diagnosis not present

## 2018-02-23 DIAGNOSIS — S32000A Wedge compression fracture of unspecified lumbar vertebra, initial encounter for closed fracture: Secondary | ICD-10-CM | POA: Diagnosis present

## 2018-02-23 DIAGNOSIS — I739 Peripheral vascular disease, unspecified: Secondary | ICD-10-CM | POA: Diagnosis not present

## 2018-02-23 DIAGNOSIS — Z419 Encounter for procedure for purposes other than remedying health state, unspecified: Secondary | ICD-10-CM

## 2018-02-23 DIAGNOSIS — Z7983 Long term (current) use of bisphosphonates: Secondary | ICD-10-CM | POA: Insufficient documentation

## 2018-02-23 DIAGNOSIS — Z7951 Long term (current) use of inhaled steroids: Secondary | ICD-10-CM | POA: Diagnosis not present

## 2018-02-23 DIAGNOSIS — Z7952 Long term (current) use of systemic steroids: Secondary | ICD-10-CM | POA: Diagnosis not present

## 2018-02-23 DIAGNOSIS — M4856XA Collapsed vertebra, not elsewhere classified, lumbar region, initial encounter for fracture: Secondary | ICD-10-CM | POA: Insufficient documentation

## 2018-02-23 DIAGNOSIS — D649 Anemia, unspecified: Secondary | ICD-10-CM | POA: Diagnosis not present

## 2018-02-23 DIAGNOSIS — Z79899 Other long term (current) drug therapy: Secondary | ICD-10-CM | POA: Insufficient documentation

## 2018-02-23 DIAGNOSIS — M810 Age-related osteoporosis without current pathological fracture: Secondary | ICD-10-CM | POA: Insufficient documentation

## 2018-02-23 DIAGNOSIS — I10 Essential (primary) hypertension: Secondary | ICD-10-CM | POA: Insufficient documentation

## 2018-02-23 DIAGNOSIS — F329 Major depressive disorder, single episode, unspecified: Secondary | ICD-10-CM | POA: Insufficient documentation

## 2018-02-23 DIAGNOSIS — Z8673 Personal history of transient ischemic attack (TIA), and cerebral infarction without residual deficits: Secondary | ICD-10-CM | POA: Diagnosis not present

## 2018-02-23 DIAGNOSIS — Z981 Arthrodesis status: Secondary | ICD-10-CM | POA: Diagnosis not present

## 2018-02-23 DIAGNOSIS — J449 Chronic obstructive pulmonary disease, unspecified: Secondary | ICD-10-CM | POA: Diagnosis not present

## 2018-02-23 DIAGNOSIS — S32040A Wedge compression fracture of fourth lumbar vertebra, initial encounter for closed fracture: Secondary | ICD-10-CM | POA: Diagnosis not present

## 2018-02-23 HISTORY — PX: KYPHOPLASTY: SHX5884

## 2018-02-23 LAB — TYPE AND SCREEN
ABO/RH(D): A POS
Antibody Screen: NEGATIVE

## 2018-02-23 SURGERY — KYPHOPLASTY
Anesthesia: General | Site: Spine Lumbar

## 2018-02-23 MED ORDER — ONDANSETRON HCL 4 MG PO TABS: 4.0000 mg | ORAL_TABLET | Freq: Four times a day (QID) | ORAL | Status: DC | PRN

## 2018-02-23 MED ORDER — ACETAMINOPHEN 650 MG RE SUPP: 650.0000 mg | RECTAL | Status: DC | PRN

## 2018-02-23 MED ORDER — ACETAMINOPHEN 325 MG PO TABS: 650.0000 mg | ORAL_TABLET | Freq: Four times a day (QID) | ORAL | Status: DC

## 2018-02-23 MED ORDER — SODIUM CHLORIDE 0.9% FLUSH
3.0000 mL | Freq: Two times a day (BID) | INTRAVENOUS | Status: DC
  Administered 2018-02-23: 3 mL via INTRAVENOUS

## 2018-02-23 MED ORDER — MORPHINE SULFATE (PF) 4 MG/ML IV SOLN: 4.0000 mg | INTRAVENOUS | Status: DC | PRN

## 2018-02-23 MED ORDER — DICYCLOMINE HCL 20 MG PO TABS
20.0000 mg | ORAL_TABLET | Freq: Four times a day (QID) | ORAL | Status: DC
  Administered 2018-02-23 – 2018-02-24 (×4): 20 mg via ORAL
  Filled 2018-02-23 (×5): qty 1

## 2018-02-23 MED ORDER — GABAPENTIN 400 MG PO CAPS
400.0000 mg | ORAL_CAPSULE | Freq: Two times a day (BID) | ORAL | Status: DC
  Administered 2018-02-23 – 2018-02-24 (×2): 400 mg via ORAL
  Filled 2018-02-23 (×2): qty 1

## 2018-02-23 MED ORDER — FOLIC ACID 1 MG PO TABS
1.0000 mg | ORAL_TABLET | Freq: Every day | ORAL | Status: DC
  Administered 2018-02-23 – 2018-02-24 (×2): 1 mg via ORAL
  Filled 2018-02-23 (×2): qty 1

## 2018-02-23 MED ORDER — MENTHOL 3 MG MT LOZG: 1.0000 | LOZENGE | OROMUCOSAL | Status: DC | PRN

## 2018-02-23 MED ORDER — MONTELUKAST SODIUM 10 MG PO TABS
10.0000 mg | ORAL_TABLET | Freq: Every day | ORAL | Status: DC
  Administered 2018-02-24: 10 mg via ORAL
  Filled 2018-02-23 (×2): qty 1

## 2018-02-23 MED ORDER — DEXAMETHASONE SODIUM PHOSPHATE 10 MG/ML IJ SOLN
INTRAMUSCULAR | Status: DC | PRN
  Administered 2018-02-23: 10 mg via INTRAVENOUS

## 2018-02-23 MED ORDER — LIDOCAINE 5 % EX PTCH
1.0000 | MEDICATED_PATCH | Freq: Every day | CUTANEOUS | Status: DC
  Administered 2018-02-23 – 2018-02-24 (×2): 1 via TRANSDERMAL
  Filled 2018-02-23 (×2): qty 1

## 2018-02-23 MED ORDER — FENTANYL CITRATE (PF) 250 MCG/5ML IJ SOLN
INTRAMUSCULAR | Status: AC
  Filled 2018-02-23: qty 5

## 2018-02-23 MED ORDER — SODIUM CHLORIDE 0.9 % IV SOLN
250.0000 mL | INTRAVENOUS | Status: DC
  Administered 2018-02-23: 250 mL via INTRAVENOUS

## 2018-02-23 MED ORDER — OXYCODONE HCL 5 MG PO TABS: 5.0000 mg | ORAL_TABLET | Freq: Four times a day (QID) | ORAL | Status: DC | PRN

## 2018-02-23 MED ORDER — BACITRACIN ZINC 500 UNIT/GM EX OINT
TOPICAL_OINTMENT | CUTANEOUS | Status: DC | PRN
  Administered 2018-02-23: 1 via TOPICAL

## 2018-02-23 MED ORDER — SUGAMMADEX SODIUM 200 MG/2ML IV SOLN
INTRAVENOUS | Status: DC | PRN
  Administered 2018-02-23: 100 mg via INTRAVENOUS

## 2018-02-23 MED ORDER — BACITRACIN ZINC 500 UNIT/GM EX OINT
TOPICAL_OINTMENT | CUTANEOUS | Status: AC
  Filled 2018-02-23: qty 28.35

## 2018-02-23 MED ORDER — FENTANYL CITRATE (PF) 100 MCG/2ML IJ SOLN
INTRAMUSCULAR | Status: AC
  Filled 2018-02-23: qty 2

## 2018-02-23 MED ORDER — PHENOL 1.4 % MT LIQD: 1.0000 | OROMUCOSAL | Status: DC | PRN

## 2018-02-23 MED ORDER — FENTANYL CITRATE (PF) 100 MCG/2ML IJ SOLN
25.0000 ug | INTRAMUSCULAR | Status: DC | PRN
  Administered 2018-02-23 (×2): 25 ug via INTRAVENOUS
  Administered 2018-02-23: 50 ug via INTRAVENOUS

## 2018-02-23 MED ORDER — TIZANIDINE HCL 2 MG PO TABS
2.0000 mg | ORAL_TABLET | Freq: Three times a day (TID) | ORAL | Status: DC | PRN
  Filled 2018-02-23: qty 1

## 2018-02-23 MED ORDER — CEFAZOLIN SODIUM-DEXTROSE 2-4 GM/100ML-% IV SOLN
2.0000 g | Freq: Three times a day (TID) | INTRAVENOUS | Status: AC
  Administered 2018-02-23 – 2018-02-24 (×2): 2 g via INTRAVENOUS
  Filled 2018-02-23 (×2): qty 100

## 2018-02-23 MED ORDER — ZOLPIDEM TARTRATE 5 MG PO TABS: 5.0000 mg | ORAL_TABLET | Freq: Every evening | ORAL | Status: DC | PRN

## 2018-02-23 MED ORDER — 0.9 % SODIUM CHLORIDE (POUR BTL) OPTIME
TOPICAL | Status: DC | PRN
  Administered 2018-02-23: 1000 mL

## 2018-02-23 MED ORDER — ONDANSETRON HCL 4 MG/2ML IJ SOLN: 4.0000 mg | Freq: Four times a day (QID) | INTRAMUSCULAR | Status: DC | PRN

## 2018-02-23 MED ORDER — OXYCODONE HCL 5 MG PO TABS: 5.0000 mg | ORAL_TABLET | ORAL | Status: DC | PRN

## 2018-02-23 MED ORDER — ACETAMINOPHEN 325 MG PO TABS: 650.0000 mg | ORAL_TABLET | ORAL | Status: DC | PRN

## 2018-02-23 MED ORDER — CEFAZOLIN SODIUM-DEXTROSE 2-4 GM/100ML-% IV SOLN
INTRAVENOUS | Status: AC
  Filled 2018-02-23: qty 100

## 2018-02-23 MED ORDER — ONDANSETRON 4 MG PO TBDP: 4.0000 mg | ORAL_TABLET | ORAL | Status: DC | PRN

## 2018-02-23 MED ORDER — BUPIVACAINE-EPINEPHRINE (PF) 0.5% -1:200000 IJ SOLN
INTRAMUSCULAR | Status: AC
  Filled 2018-02-23: qty 30

## 2018-02-23 MED ORDER — CEFAZOLIN SODIUM-DEXTROSE 2-4 GM/100ML-% IV SOLN
2.0000 g | INTRAVENOUS | Status: AC
  Administered 2018-02-23: 2 g via INTRAVENOUS

## 2018-02-23 MED ORDER — PROPOFOL 10 MG/ML IV BOLUS
INTRAVENOUS | Status: DC | PRN
  Administered 2018-02-23: 50 mg via INTRAVENOUS

## 2018-02-23 MED ORDER — FERROUS SULFATE 325 (65 FE) MG PO TABS
325.0000 mg | ORAL_TABLET | Freq: Two times a day (BID) | ORAL | Status: DC
  Administered 2018-02-24: 325 mg via ORAL
  Filled 2018-02-23 (×2): qty 1

## 2018-02-23 MED ORDER — IOPAMIDOL (ISOVUE-300) INJECTION 61%
INTRAVENOUS | Status: DC | PRN
  Administered 2018-02-23: 50 mL

## 2018-02-23 MED ORDER — LISINOPRIL 20 MG PO TABS
20.0000 mg | ORAL_TABLET | Freq: Every day | ORAL | Status: DC
  Administered 2018-02-23 – 2018-02-24 (×2): 20 mg via ORAL
  Filled 2018-02-23 (×2): qty 1

## 2018-02-23 MED ORDER — FENTANYL CITRATE (PF) 100 MCG/2ML IJ SOLN
INTRAMUSCULAR | Status: DC | PRN
  Administered 2018-02-23 (×2): 50 ug via INTRAVENOUS

## 2018-02-23 MED ORDER — DOCUSATE SODIUM 100 MG PO CAPS: 100.0000 mg | ORAL_CAPSULE | Freq: Two times a day (BID) | ORAL | Status: DC

## 2018-02-23 MED ORDER — LIDOCAINE HCL (CARDIAC) PF 100 MG/5ML IV SOSY
PREFILLED_SYRINGE | INTRAVENOUS | Status: DC | PRN
  Administered 2018-02-23: 60 mg via INTRAVENOUS

## 2018-02-23 MED ORDER — VITAMIN B-1 100 MG PO TABS
100.0000 mg | ORAL_TABLET | Freq: Every day | ORAL | Status: DC
  Administered 2018-02-23 – 2018-02-24 (×2): 100 mg via ORAL
  Filled 2018-02-23 (×5): qty 1

## 2018-02-23 MED ORDER — HYDROCODONE-ACETAMINOPHEN 5-325 MG PO TABS
1.0000 | ORAL_TABLET | ORAL | Status: DC | PRN
  Administered 2018-02-23 – 2018-02-24 (×3): 2 via ORAL
  Filled 2018-02-23 (×3): qty 2

## 2018-02-23 MED ORDER — ROCURONIUM BROMIDE 100 MG/10ML IV SOLN
INTRAVENOUS | Status: DC | PRN
  Administered 2018-02-23: 40 mg via INTRAVENOUS

## 2018-02-23 MED ORDER — IOPAMIDOL (ISOVUE-300) INJECTION 61%
INTRAVENOUS | Status: AC
  Filled 2018-02-23: qty 50

## 2018-02-23 MED ORDER — MOMETASONE FURO-FORMOTEROL FUM 200-5 MCG/ACT IN AERO
2.0000 | INHALATION_SPRAY | Freq: Two times a day (BID) | RESPIRATORY_TRACT | Status: DC
  Administered 2018-02-23 – 2018-02-24 (×2): 2 via RESPIRATORY_TRACT
  Filled 2018-02-23: qty 8.8

## 2018-02-23 MED ORDER — BISACODYL 10 MG RE SUPP: 10.0000 mg | Freq: Every day | RECTAL | Status: DC | PRN

## 2018-02-23 MED ORDER — BUPIVACAINE-EPINEPHRINE 0.5% -1:200000 IJ SOLN
INTRAMUSCULAR | Status: DC | PRN
  Administered 2018-02-23: 10 mL

## 2018-02-23 MED ORDER — ACETAMINOPHEN 500 MG PO TABS
1000.0000 mg | ORAL_TABLET | Freq: Four times a day (QID) | ORAL | Status: AC
  Administered 2018-02-24: 1000 mg via ORAL
  Filled 2018-02-23 (×2): qty 2

## 2018-02-23 MED ORDER — MIRTAZAPINE 15 MG PO TABS
15.0000 mg | ORAL_TABLET | Freq: Every day | ORAL | Status: DC
  Administered 2018-02-23: 15 mg via ORAL
  Filled 2018-02-23 (×2): qty 1

## 2018-02-23 MED ORDER — IPRATROPIUM-ALBUTEROL 0.5-2.5 (3) MG/3ML IN SOLN: 3.0000 mL | Freq: Four times a day (QID) | RESPIRATORY_TRACT | Status: DC | PRN

## 2018-02-23 MED ORDER — PANTOPRAZOLE SODIUM 40 MG PO TBEC
40.0000 mg | DELAYED_RELEASE_TABLET | Freq: Every day | ORAL | Status: DC
  Administered 2018-02-24: 40 mg via ORAL
  Filled 2018-02-23 (×2): qty 1

## 2018-02-23 MED ORDER — CALCIUM POLYCARBOPHIL 625 MG PO TABS
625.0000 mg | ORAL_TABLET | Freq: Every day | ORAL | Status: DC
Start: 2018-02-23 — End: 2018-02-24
  Administered 2018-02-23 – 2018-02-24 (×2): 625 mg via ORAL
  Filled 2018-02-23 (×2): qty 1

## 2018-02-23 MED ORDER — PREDNISONE 5 MG PO TABS
5.0000 mg | ORAL_TABLET | Freq: Every day | ORAL | Status: DC
  Administered 2018-02-24: 5 mg via ORAL
  Filled 2018-02-23: qty 1

## 2018-02-23 MED ORDER — MIDAZOLAM HCL 2 MG/2ML IJ SOLN
INTRAMUSCULAR | Status: AC
  Filled 2018-02-23: qty 2

## 2018-02-23 MED ORDER — CHLORHEXIDINE GLUCONATE CLOTH 2 % EX PADS: 6.0000 | MEDICATED_PAD | Freq: Once | CUTANEOUS | Status: DC

## 2018-02-23 MED ORDER — PHENYLEPHRINE HCL 10 MG/ML IJ SOLN
INTRAVENOUS | Status: DC | PRN
  Administered 2018-02-23: 40 ug/min via INTRAVENOUS

## 2018-02-23 MED ORDER — SODIUM CHLORIDE 0.9% FLUSH: 3.0000 mL | INTRAVENOUS | Status: DC | PRN

## 2018-02-23 MED ORDER — LACTATED RINGERS IV SOLN
INTRAVENOUS | Status: DC | PRN
  Administered 2018-02-23: 07:00:00 via INTRAVENOUS

## 2018-02-23 MED ORDER — DOCUSATE SODIUM 100 MG PO CAPS
100.0000 mg | ORAL_CAPSULE | Freq: Two times a day (BID) | ORAL | Status: DC
  Administered 2018-02-24: 100 mg via ORAL
  Filled 2018-02-23 (×2): qty 1

## 2018-02-23 MED ORDER — ONDANSETRON HCL 4 MG/2ML IJ SOLN
INTRAMUSCULAR | Status: DC | PRN
  Administered 2018-02-23: 4 mg via INTRAVENOUS

## 2018-02-23 MED ORDER — PROPOFOL 10 MG/ML IV BOLUS
INTRAVENOUS | Status: AC
  Filled 2018-02-23: qty 20

## 2018-02-23 MED ORDER — PHENYLEPHRINE HCL 10 MG/ML IJ SOLN
INTRAMUSCULAR | Status: DC | PRN
  Administered 2018-02-23: 80 ug via INTRAVENOUS
  Administered 2018-02-23: 40 ug via INTRAVENOUS
  Administered 2018-02-23: 120 ug via INTRAVENOUS
  Administered 2018-02-23: 80 ug via INTRAVENOUS

## 2018-02-23 MED ORDER — FLUOXETINE HCL 20 MG PO CAPS
40.0000 mg | ORAL_CAPSULE | Freq: Every day | ORAL | Status: DC
  Administered 2018-02-24: 40 mg via ORAL
  Filled 2018-02-23 (×3): qty 2

## 2018-02-23 SURGICAL SUPPLY — 45 items
BENZOIN TINCTURE PRP APPL 2/3 (GAUZE/BANDAGES/DRESSINGS) ×2 IMPLANT
BLADE CLIPPER SURG (BLADE) IMPLANT
BLADE SURG 15 STRL LF DISP TIS (BLADE) ×1 IMPLANT
BLADE SURG 15 STRL SS (BLADE) ×1
CARTRIDGE OIL MAESTRO DRILL (MISCELLANEOUS) IMPLANT
CEMENT BONE KYPHX HV R (Orthopedic Implant) ×2 IMPLANT
CEMENT KYPHON C01A KIT/MIXER (Cement) ×2 IMPLANT
CONT SPEC STER OR (MISCELLANEOUS) ×2 IMPLANT
DEVICE BIOPSY BONE KYPHX (INSTRUMENTS) ×2 IMPLANT
DIFFUSER DRILL AIR PNEUMATIC (MISCELLANEOUS) IMPLANT
DRAPE C-ARM 42X72 X-RAY (DRAPES) ×2 IMPLANT
DRAPE HALF SHEET 40X57 (DRAPES) ×2 IMPLANT
DRAPE INCISE IOBAN 66X45 STRL (DRAPES) ×2 IMPLANT
DRAPE LAPAROTOMY 100X72X124 (DRAPES) ×2 IMPLANT
DRAPE SURG 17X23 STRL (DRAPES) ×8 IMPLANT
DRAPE WARM FLUID 44X44 (DRAPE) ×2 IMPLANT
DRSG OPSITE POSTOP 4X6 (GAUZE/BANDAGES/DRESSINGS) ×2 IMPLANT
GAUZE SPONGE 4X4 12PLY STRL (GAUZE/BANDAGES/DRESSINGS) IMPLANT
GAUZE SPONGE 4X4 16PLY XRAY LF (GAUZE/BANDAGES/DRESSINGS) ×2 IMPLANT
GLOVE BIO SURGEON STRL SZ8 (GLOVE) ×2 IMPLANT
GLOVE BIO SURGEON STRL SZ8.5 (GLOVE) ×2 IMPLANT
GLOVE BIOGEL PI IND STRL 7.5 (GLOVE) ×4 IMPLANT
GLOVE BIOGEL PI INDICATOR 7.5 (GLOVE) ×4
GLOVE ECLIPSE 7.0 STRL STRAW (GLOVE) ×2 IMPLANT
GLOVE EXAM NITRILE LRG STRL (GLOVE) IMPLANT
GLOVE EXAM NITRILE XL STR (GLOVE) IMPLANT
GLOVE EXAM NITRILE XS STR PU (GLOVE) IMPLANT
GOWN STRL REUS W/ TWL LRG LVL3 (GOWN DISPOSABLE) IMPLANT
GOWN STRL REUS W/ TWL XL LVL3 (GOWN DISPOSABLE) ×3 IMPLANT
GOWN STRL REUS W/TWL LRG LVL3 (GOWN DISPOSABLE)
GOWN STRL REUS W/TWL XL LVL3 (GOWN DISPOSABLE) ×3
KIT BASIN OR (CUSTOM PROCEDURE TRAY) ×2 IMPLANT
KIT TURNOVER KIT B (KITS) ×2 IMPLANT
NEEDLE HYPO 22GX1.5 SAFETY (NEEDLE) ×2 IMPLANT
NS IRRIG 1000ML POUR BTL (IV SOLUTION) ×2 IMPLANT
OIL CARTRIDGE MAESTRO DRILL (MISCELLANEOUS)
PACK EENT II TURBAN DRAPE (CUSTOM PROCEDURE TRAY) ×2 IMPLANT
PAD ARMBOARD 7.5X6 YLW CONV (MISCELLANEOUS) ×12 IMPLANT
SPECIMEN JAR SMALL (MISCELLANEOUS) IMPLANT
STRIP CLOSURE SKIN 1/2X4 (GAUZE/BANDAGES/DRESSINGS) ×2 IMPLANT
SUT VIC AB 3-0 SH 8-18 (SUTURE) ×2 IMPLANT
SYR CONTROL 10ML LL (SYRINGE) ×2 IMPLANT
TOWEL GREEN STERILE (TOWEL DISPOSABLE) ×2 IMPLANT
TOWEL GREEN STERILE FF (TOWEL DISPOSABLE) ×2 IMPLANT
TRAY KYPHOPAK 20/3 ONESTEP 1ST (MISCELLANEOUS) ×2 IMPLANT

## 2018-02-23 NOTE — Progress Notes (Signed)
Subjective: The patient is alert and pleasant.  She is in no apparent distress.  Objective: Vital signs in last 24 hours: Temp:  [97.7 F (36.5 C)-98.5 F (36.9 C)] 98.5 F (36.9 C) (06/18 1004) Pulse Rate:  [70-76] 71 (06/18 1004) Resp:  [13-20] 18 (06/18 1004) BP: (121-182)/(52-81) 156/80 (06/18 1004) SpO2:  [94 %-100 %] 95 % (06/18 1004) Weight:  [50.8 kg (112 lb)-53.2 kg (117 lb 4.6 oz)] 53.2 kg (117 lb 4.6 oz) (06/18 1000) Estimated body mass index is 20.78 kg/m as calculated from the following:   Height as of this encounter: 5\' 3"  (1.6 m).   Weight as of this encounter: 53.2 kg (117 lb 4.6 oz).   Intake/Output from previous day: No intake/output data recorded. Intake/Output this shift: Total I/O In: 1020 [P.O.:120; I.V.:900] Out: 25 [Blood:25]  Physical exam patient is alert and pleasant.  She is moving her lower extremities well.  Lab Results: No results for input(s): WBC, HGB, HCT, PLT in the last 72 hours. BMET No results for input(s): NA, K, CL, CO2, GLUCOSE, BUN, CREATININE, CALCIUM in the last 72 hours.  Studies/Results: Dg Lumbar Spine 2-3 Views  Result Date: 02/23/2018 CLINICAL DATA:  Kyphoplasty EXAM: DG C-ARM 61-120 MIN; LUMBAR SPINE - 2-3 VIEW COMPARISON:  None. FLUOROSCOPY TIME:  1 minutes 37 seconds FINDINGS: Interval L4 vertebral body augmentation with methylmethacrylate within the L4 vertebral body. 50% anterior height loss of the L4 vertebral body. IMPRESSION: Interval L4 kyphoplasty. Electronically Signed   By: Kathreen Devoid   On: 02/23/2018 09:00   Dg C-arm 1-60 Min  Result Date: 02/23/2018 CLINICAL DATA:  Kyphoplasty EXAM: DG C-ARM 61-120 MIN; LUMBAR SPINE - 2-3 VIEW COMPARISON:  None. FLUOROSCOPY TIME:  1 minutes 37 seconds FINDINGS: Interval L4 vertebral body augmentation with methylmethacrylate within the L4 vertebral body. 50% anterior height loss of the L4 vertebral body. IMPRESSION: Interval L4 kyphoplasty. Electronically Signed   By: Kathreen Devoid   On: 02/23/2018 09:00    Assessment/Plan: Status post kyphoplasty: The patient is doing well.  I spoke with her daughter-in-law.  I will plan to keep her overnight given her severe lung disease.  LOS: 0 days     Ophelia Charter 02/23/2018, 10:26 AM

## 2018-02-23 NOTE — Anesthesia Postprocedure Evaluation (Signed)
Anesthesia Post Note  Patient: Jordan [name]  Procedure(s) Performed: KYPHOPLASTY LUMBAR FOUR (N/A Spine Lumbar)     Patient location during evaluation: PACU Anesthesia Type: General Level of consciousness: awake and alert Pain management: pain level controlled Vital Signs Assessment: post-procedure vital signs reviewed and stable Respiratory status: spontaneous breathing, nonlabored ventilation and respiratory function stable Cardiovascular status: blood pressure returned to baseline and stable Postop Assessment: no apparent nausea or vomiting Anesthetic complications: no    Last Vitals:  Vitals:   02/23/18 0940 02/23/18 1004  BP: (!) 141/73 (!) 156/80  Pulse: 72 71  Resp: 19 18  Temp: 36.5 C 36.9 C  SpO2: 94% 95%    Last Pain:  Vitals:   02/23/18 1004  TempSrc: Oral  PainSc:                  Chene Kasinger,W. EDMOND

## 2018-02-23 NOTE — Anesthesia Procedure Notes (Signed)
Procedure Name: Intubation Date/Time: 02/23/2018 7:38 AM Performed by: Gaylene Brooks, CRNA Pre-anesthesia Checklist: Patient identified, Emergency Drugs available, Suction available and Patient being monitored Patient Re-evaluated:Patient Re-evaluated prior to induction Oxygen Delivery Method: Circle System Utilized Preoxygenation: Pre-oxygenation with 100% oxygen Induction Type: IV induction Ventilation: Mask ventilation without difficulty Laryngoscope Size: Glidescope and 3 Grade View: Grade I Tube type: Oral Tube size: 7.0 mm Number of attempts: 1 Airway Equipment and Method: Stylet and Oral airway Placement Confirmation: ETT inserted through vocal cords under direct vision,  positive ETCO2 and breath sounds checked- equal and bilateral Secured at: 22 cm Tube secured with: Tape Dental Injury: Teeth and Oropharynx as per pre-operative assessment  Comments: Pt with very limited ROM in neck due to prior cervical surgery. Glidescope was used without difficulty.

## 2018-02-23 NOTE — H&P (Signed)
Subjective: The patient is a 72 year old white female with osteoporosis who has complained of back pain.  She was worked up with lumbar x-rays and lumbar MRI which demonstrated an L4 compression fracture.  She failed medical management.  I discussed the various treatment option with her.  She has decided to proceed with a kyphoplasty.  Past Medical History:  Diagnosis Date  . Allergy    seasonal  . Anemia   . Arthritis   . Asthma    uses Advair and Spiriva daily,Albuterol prn.Takes Singulair at bedtime and Prednisone daily  . Back spasm    takes Zanaflex daily as needed  . Blood transfusion 2007   no abnormal reation noted   . Bruises easily    d/t being on Prednisone  . Bursitis    left elbow  . C. difficile colitis    2007: treated with Flagyl and Vanc, 2014: treated with vanc, Aug 2014: Vanc taper  . Chronic back pain   . Chronic kidney disease    "creatine" creeping up - followed at this time by PCP  . Complication of anesthesia 2000   woke up during one surgery- ovary surgery  . COPD (chronic obstructive pulmonary disease) ([place])   . Depression    takes Prozac daily  . Dysphasia   . Falls   . H/O hiatal hernia   . Hemorrhoids   . History of bronchitis    last time a yr ago  . History of colon polyps   . History of kidney stones   . History of kidney stones   . Hyperlipidemia    hx of-was on meds but has been off x 1 1/2 yrs  . Hypertension    takes Verapamil nightly  . IBS (irritable bowel syndrome)   . Insomnia    takes Benadryl as needed  . Myocardial infarction [facility]) 2007   Takotsubo cardiomyopathy  . Neurogenic bowel   . Neuromuscular dysfunction of bladder, unspecified   . Nocturia   . Osteoporosis    takes Fosamax weekly  . Pneumonia    MRSA pneumonia in 2007  . Shortness of breath    with exertion daily  . Stroke [facility])    "they say I has a small stroke".No deficits  . Tingling    and pain in left leg  . Urinary frequency     Past Surgical  History:  Procedure Laterality Date  . APPENDECTOMY    . BIOPSY  04/13/2017   Procedure: BIOPSY;  Surgeon: Daneil Dolin, MD;  [facility];  Service: Endoscopy;;  right and left colon   . CARPAL TUNNEL RELEASE Left   . CARPAL TUNNEL RELEASE Right 09/21/2015   Procedure: CARPAL TUNNEL RELEASE;  Surgeon: Carole Civil, MD;  Location: [facility]S;  Service: Orthopedics;  Laterality: Right;  . cataract surgery Bilateral   . CHOLECYSTECTOMY    . COLONOSCOPY   09/24/2006   RMR: Normal rectum, left-sided diverticula  . COLONOSCOPY  Oct 2012   Dr. Henrene Pastor: tubular adenomas, moderative diverticulosis, internal hemorrhoids  . COLONOSCOPY WITH PROPOFOL N/A 04/13/2017   Procedure: COLONOSCOPY WITH PROPOFOL;  Surgeon: Daneil Dolin, MD;  [facility];  Service: Endoscopy;  Laterality: N/A;  8:30am  . CYSTOSCOPY    . ENDARTERECTOMY Right 12/12/2015   Procedure: ENDARTERECTOMY RIGHT CAROTID, RESECTION OF REDUNDANT RIGHT COMMON CAROTID ARTERY WITH PRIMARY REANASTOMOSIS;  Surgeon: Mal Misty, MD;  Location: [place];  Service: Vascular;  Laterality: Right;  . ESOPHAGOGASTRODUODENOSCOPY  04/07/2006   IDP:OEUMPN esophagus s/p placement of Bravo pH probe, some surgical changes but not typical of fundoplication wrap, may have slipped  . GANGLION CYST EXCISION Right 09/21/2015   Procedure: RIGHT WRIST GANGLION CYST REMOVAL;  Surgeon: Carole Civil, MD;  Location: [facility];  Service: Orthopedics;  Laterality: Right;  . hemorrhoidal banding    . HERNIA REPAIR     umbilical  . KNEE ARTHROSCOPY Right   . LUMBAR LAMINECTOMY/DECOMPRESSION MICRODISCECTOMY Left 10/17/2013   Procedure: LUMBAR LAMINECTOMY/DECOMPRESSION MICRODISCECTOMY 1 LEVEL three/four;  Surgeon: Ophelia Charter, MD;  Location: [place] NEURO ORS;  Service: Neurosurgery;  Laterality: Left;  . NECK SURGERY     fusion-   . NISSEN FUNDOPLICATION     x 2  . OLECRANON BURSECTOMY Left 01/11/2014   Procedure: OLECRANON BURSECTOMY;   Surgeon: Carole Civil, MD;  Location: [facility];  Service: Orthopedics;  Laterality: Left;  . OOPHORECTOMY Right   . PATCH ANGIOPLASTY Right 12/12/2015   Procedure: PATCH ANGIOPLASTY RIGHT CAROTID ARTERY USING HEMASHIELD PLATINUM FINESSE PATCH;  Surgeon: Mal Misty, MD;  Location: [place];  Service: Vascular;  Laterality: Right;  . TONSILLECTOMY      Allergies  Allergen Reactions  . Cardura [Doxazosin Mesylate] Hives  . Barbiturates Nausea And Vomiting and Rash  . Levofloxacin Rash  . Sulfa Antibiotics Nausea Only and Rash    Social History   Tobacco Use  . Smoking status: Never Smoker  . Smokeless tobacco: Never Used  Substance Use Topics  . Alcohol use: Not Currently    Alcohol/week: 6.0 oz    Types: 10 Cans of beer per week    Comment: couple of beers daily    Family History  Problem Relation Age of Onset  . Heart disease Father   . Colon cancer Neg Hx    Prior to Admission medications   Medication Sig Start Date End Date Taking? Authorizing Provider  acetaminophen (TYLENOL) 325 MG tablet Take 650 mg by mouth 4 (four) times daily.   Yes [provider]  budesonide-formoterol (SYMBICORT) 160-4.5 MCG/ACT inhaler Inhale 2 puffs into the lungs 2 (two) times daily.   Yes [provider]  diclofenac sodium (VOLTAREN) 1 % GEL Apply 4 g topically 4 (four) times daily.   Yes [provider]  dicyclomine (BENTYL) 20 MG tablet Take 20 mg by mouth every 6 (six) hours.   Yes [provider]  docusate sodium (COLACE) 100 MG capsule Take 1 capsule (100 mg total) by mouth every 12 (twelve) hours. While taking oxycodone 01/21/18  Yes Mesner, Corene Cornea, MD  ferrous sulfate 325 (65 FE) MG tablet Take 325 mg by mouth 2 (two) times daily with a meal.   Yes [provider]  FLUoxetine (PROZAC) 40 MG capsule Take 40 mg by mouth daily.     Yes [provider]  folic acid (FOLVITE) 1 MG tablet Take 1 mg by mouth daily.   Yes [provider]   gabapentin (NEURONTIN) 400 MG capsule Take 400 mg by mouth 2 (two) times daily.   Yes [provider]  HYDROcodone-acetaminophen (NORCO/VICODIN) 5-325 MG tablet Take 2 tablets by mouth every 6 (six) hours as needed for moderate pain.    Yes [provider]  Lidocaine 4 % PTCH Apply 1 patch topically daily. Left hip   Yes [provider]  lisinopril (PRINIVIL,ZESTRIL) 20 MG tablet Take 20 mg by mouth daily.   Yes [provider]  Melatonin 3 MG TABS Take 6  mg by mouth at bedtime.   Yes [provider]  mirtazapine (REMERON) 15 MG tablet Take 15 mg by mouth at bedtime.   Yes [provider]  montelukast (SINGULAIR) 10 MG tablet Take 10 mg by mouth daily.    Yes [provider]  pantoprazole (PROTONIX) 40 MG tablet Take 40 mg by mouth daily.   Yes [provider]  polycarbophil (FIBERCON) 625 MG tablet Take 625 mg by mouth daily.   Yes [provider]  predniSONE (DELTASONE) 5 MG tablet Take 5 mg by mouth daily with breakfast.   Yes [provider]  Probiotic Product (ALIGN) 4 MG CAPS Take 1 capsule by mouth daily.   Yes [provider]  thiamine 100 MG tablet Take 100 mg by mouth daily.   Yes [provider]  tiZANidine (ZANAFLEX) 2 MG tablet Take 2 mg by mouth every 8 (eight) hours as needed for muscle spasms.   Yes [provider]  vitamin C (ASCORBIC ACID) 500 MG tablet Take 500 mg by mouth 2 (two) times daily.   Yes [provider]  ipratropium-albuterol (DUONEB) 0.5-2.5 (3) MG/3ML SOLN Take 3 mLs by nebulization every 6 (six) hours as needed (SOB).    [provider]  ondansetron (ZOFRAN-ODT) 4 MG disintegrating tablet Take 4 mg by mouth every 4 (four) hours as needed for nausea or vomiting.    [provider]  oxyCODONE (ROXICODONE) 5 MG immediate release tablet Take 1-2 tablets (5-10 mg total) by mouth every 6 (six) hours as needed for breakthrough  pain. 01/21/18   Mesner, Corene Cornea, MD  OXYGEN Inhale 2 L into the lungs as needed (TO MAINTAIN O2 LEVELS).    [provider]     Review of Systems  Positive ROS: As above  All other systems have been reviewed and were otherwise negative with the exception of those mentioned in the HPI and as above.  Objective: Vital signs in last 24 hours: Temp:  [98.4 F (36.9 C)] 98.4 F (36.9 C) (06/18 0637) Pulse Rate:  [70] 70 (06/18 0637) Resp:  [18] 18 (06/18 0637) BP: (121)/(52) 121/52 (06/18 0637) SpO2:  [98 %] 98 % (06/18 0637) Weight:  [50.8 kg (112 lb)] 50.8 kg (112 lb) (06/18 0637) Estimated body mass index is 19.84 kg/m as calculated from the following:   Height as of this encounter: 5\' 3"  (1.6 m).   Weight as of this encounter: 50.8 kg (112 lb).   General Appearance: Alert Head: Normocephalic, without obvious abnormality, atraumatic Eyes: PERRL, conjunctiva/corneas clear, EOM's intact,    Ears: Normal  Throat: Normal  Neck: Supple, Back: unremarkable Lungs: Clear to auscultation bilaterally, respirations unlabored Heart: Regular rate and rhythm, no murmur, rub or gallop Abdomen: Soft, non-tender Extremities: Extremities normal, atraumatic, no cyanosis or edema Skin: unremarkable  NEUROLOGIC:   Mental status: alert and oriented,Motor Exam - grossly normal Sensory Exam - grossly normal Reflexes:  Coordination - grossly normal Gait - grossly normal Balance - grossly normal Cranial Nerves: I: smell Not tested  II: visual acuity  OS: Normal  OD: Normal   II: visual fields Full to confrontation  II: pupils Equal, round, reactive to light  III,VII: ptosis None  III,IV,VI: extraocular muscles  Full ROM  V: mastication Normal  V: facial light touch sensation  Normal  V,VII: corneal reflex  Present  VII: facial muscle function - upper  Normal  VII: facial muscle function - lower Normal  VIII: hearing Not tested  IX: soft  palate elevation  Normal  IX,X: gag  reflex Present  XI: trapezius strength  5/5  XI: sternocleidomastoid strength 5/5  XI: neck flexion strength  5/5  XII: tongue strength  Normal    Data Review Lab Results  Component Value Date   WBC 9.6 02/19/2018   HGB 9.1 (L) 02/19/2018   HCT 30.3 (L) 02/19/2018   MCV 101.0 (H) 02/19/2018   PLT 250 02/19/2018   Lab Results  Component Value Date   NA 141 02/19/2018   K 4.9 02/19/2018   CL 113 (H) 02/19/2018   CO2 22 02/19/2018   BUN 27 (H) 02/19/2018   CREATININE 1.67 (H) 02/19/2018   GLUCOSE 106 (H) 02/19/2018   Lab Results  Component Value Date   INR 0.99 05/05/2017    Assessment/Plan: L4 fracture, lumbago: I have discussed the situation with the patient.  I reviewed her imaging studies with her.  We have discussed the various treatment options including an L4 kyphoplasty.  I have described the procedure to her.  I have given her surgical pamphlet.  We have discussed the risks, benefits, alternatives, expected postoperative course, and likelihood of achieving our goals with surgery.  I have answered all her questions.  She has decided to proceed with surgery.   Ophelia Charter 02/23/2018 7:20 AM

## 2018-02-23 NOTE — Transfer of Care (Signed)
Immediate Anesthesia Transfer of Care Note  Patient: Jordan [name]  Procedure(s) Performed: KYPHOPLASTY LUMBAR FOUR (N/A Spine Lumbar)  Patient Location: PACU  Anesthesia Type:General  Level of Consciousness: awake, alert , oriented and drowsy  Airway & Oxygen Therapy: Patient Spontanous Breathing and Patient connected to nasal cannula oxygen  Post-op Assessment: Report given to RN, Post -op Vital signs reviewed and stable and Patient moving all extremities X 4  Post vital signs: Reviewed and stable  Last Vitals:  Vitals Value Taken Time  BP 195/91 02/23/2018  8:57 AM  Temp    Pulse 76 02/23/2018  8:59 AM  Resp 26 02/23/2018  8:59 AM  SpO2 100 % 02/23/2018  8:59 AM  Vitals shown include unvalidated device data.  Last Pain:  Vitals:   02/23/18 0637  TempSrc: Oral  PainSc:          Complications: No apparent anesthesia complications

## 2018-02-23 NOTE — Op Note (Signed)
Brief history: The patient is a 72 year old white female who has complained of low back pain.  She was worked up with a lumbar MRI which demonstrated a L4 compression fracture.  I discussed the various treatment options.  The patient has decided to proceed with an L4 kyphoplasty after weighing the risks, benefits and alternatives.  Preop diagnosis: L4 compression fracture, lumbago  Postop diagnosis: The same  Procedure: L4 kyphoplasty and bone biopsy  Surgeon: Dr. Earle Gell  Assistant: None  Anesthesia: General endotracheal  Estimated loss: Minimal  Specimens: L4 vertebral body biopsy  Complications: None  Description of procedure: The patient was brought to the operating room by the anesthesia team.  General endotracheal anesthesia was induced.  The patient was turned to the prone position on the chest rolls.  Her lumbosacral region was then prepared with Betadine scrub and Betadine solution.  Sterile drapes were applied.  I then injected the area to be incised with Marcaine with epinephrine solution.  I made small incisions over the bilateral L4 pedicles with a 15 blade scalpel.  Under biplanar fluoroscopy I cannulated the bilateral L4 pedicles.  I took a left vertebral body biopsy.  Inserted the drill through the bilateral cannulas.  We removed the drill.  We inserted the balloon.  Under biplanar fluoroscopy we inflated the balloon.  I then deflated the balloon.  I then remove the balloon.  I then injected bone cement to the cannulized sequentially bilaterally under biplanar fluoroscopy.  After we were satisfied with thebcement filling we removed the cannulas.  I then reapproximated patient's subcutaneous tissue with interrupted 3-0 Vicryl suture.  I reapproximate the skin with Steri-Strips and benzoin.  A sterile dressing was applied.  The drapes were removed.  The patient was separately returned to the supine position.  By report all sponge, instrument, and needle counts were correct at  the end of this case.

## 2018-02-24 ENCOUNTER — Encounter (HOSPITAL_COMMUNITY): Payer: Self-pay | Admitting: Neurosurgery

## 2018-02-24 DIAGNOSIS — I739 Peripheral vascular disease, unspecified: Secondary | ICD-10-CM | POA: Diagnosis not present

## 2018-02-24 DIAGNOSIS — Z7401 Bed confinement status: Secondary | ICD-10-CM | POA: Diagnosis not present

## 2018-02-24 DIAGNOSIS — J449 Chronic obstructive pulmonary disease, unspecified: Secondary | ICD-10-CM | POA: Diagnosis not present

## 2018-02-24 DIAGNOSIS — M4856XA Collapsed vertebra, not elsewhere classified, lumbar region, initial encounter for fracture: Secondary | ICD-10-CM | POA: Diagnosis not present

## 2018-02-24 DIAGNOSIS — I10 Essential (primary) hypertension: Secondary | ICD-10-CM | POA: Diagnosis not present

## 2018-02-24 DIAGNOSIS — M2550 Pain in unspecified joint: Secondary | ICD-10-CM | POA: Diagnosis not present

## 2018-02-24 DIAGNOSIS — Z7983 Long term (current) use of bisphosphonates: Secondary | ICD-10-CM | POA: Diagnosis not present

## 2018-02-24 MED ORDER — HYDROCODONE-ACETAMINOPHEN 5-325 MG PO TABS: 2.0000 | ORAL_TABLET | Freq: Four times a day (QID) | ORAL | 0 refills | Status: DC | PRN

## 2018-02-24 NOTE — Progress Notes (Signed)
Patient returning to [facility] formally known as [facility] [place] [phone]. RN spoke with Jamelle Rushing, [place]. Patient's IV was discontinued, tip of catheter intact, pressure gauze applied, PTAR on site, RN awaiting the signature of MD for patient's narcotic, vital signs obtained and noted as follows: B/P-120/62, HR-70, SPO2-97% on RA.

## 2018-02-24 NOTE — Social Work (Signed)
Clinical Social Worker facilitated patient discharge including contacting patient family and facility to confirm patient discharge plans.  Clinical information faxed to facility and family agreeable with plan.  CSW arranged ambulance transport via [facility] to [facility] [place].  RN to call [phone] with report  prior to discharge.  Clinical Social Worker will sign off for now as social work intervention is no longer needed. Please consult [place] again if new need arises.  Alexander Mt, [place] Social Worker [phone]

## 2018-02-24 NOTE — NC FL2 (Signed)
[place] LEVEL OF CARE SCREENING TOOL     IDENTIFICATION  Patient Name: Jordan [name] Birthdate: 1946/07/21 Sex: female Admission Date (Current Location): 02/23/2018  [place] and [place] Number:  [place] [id number] [place] and Address:  [facility] [place], [place] [address] [place], [place] [address]      Provider Number: [id number]  Attending Physician Name and Address:  Newman Pies, MD  Relative Name and Phone Number:  Ala Kratz, brother, [phone]    Current Level of Care: Hospital Recommended Level of Care: [place] Prior Approval Number:    Date Approved/Denied:   PASRR Number: [id number]  Discharge Plan: SNF    Current Diagnoses: Patient Active Problem List   Diagnosis Date Noted  . Lumbar compression fracture ([place]) 02/23/2018  . Anemia 02/20/2017  . History of colon polyps 02/20/2017  . Chronic diarrhea 02/20/2017  . Abnormal weight loss 02/20/2017  . Carotid stenosis, symptomatic w/o infarct 01/01/2016  . Carotid stenosis 11/27/2015  . Ganglion cyst of wrist   . Hematoma 01/23/2014  . Lumbar foraminal stenosis 10/17/2013  . Olecranon bursitis of left elbow 09/14/2013  . Left elbow pain 09/14/2013  . C. difficile diarrhea 06/20/2013  . Loose stools 03/23/2013  . 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 PALPITATIONS 05/04/2009  . CHEST PAIN UNSPECIFIED 05/04/2009  . HYPERTENSION 04/24/2009  . ASTHMA 04/24/2009  . COPD 04/24/2009    Orientation RESPIRATION BLADDER Height & Weight     Self, Time, Situation, Place  Normal Continent, External catheter Weight: 117 lb 4.6 oz (53.2 kg) Height:  5\' 3"  (160 cm)  BEHAVIORAL SYMPTOMS/MOOD NEUROLOGICAL BOWEL NUTRITION STATUS      Continent Diet(see discharge summary)  AMBULATORY STATUS COMMUNICATION OF NEEDS Skin   Total Care Verbally Surgical wounds(incision on back and incision on wrist)                       Personal Care Assistance Level of  Assistance  Bathing, Feeding, Dressing Bathing Assistance: Maximum assistance Feeding assistance: Limited assistance Dressing Assistance: Maximum assistance     Functional Limitations Info  Sight, Hearing, Speech Sight Info: Adequate Hearing Info: Adequate Speech Info: Adequate    SPECIAL CARE FACTORS FREQUENCY  PT (By licensed PT), OT (By licensed OT)     PT Frequency: 3x week OT Frequency: 2x week            Contractures Contractures Info: Present(r leg contracted )    Additional Factors Info  Code Status, Allergies, Psychotropic Code Status Info: Full Code Allergies Info: CARDURA DOXAZOSIN MESYLATE, BARBITURATES, LEVOFLOXACIN, SULFA ANTIBIOTICS  Psychotropic Info: FLUoxetine (PROZAC) capsule 40 mg daily PO; mirtazapine (REMERON) tablet 15 mg daily at bedtime PO         Current Medications (02/24/2018):  This is the current hospital active medication list Current Facility-Administered Medications  Medication Dose Route Frequency Provider Last Rate Last Dose  . 0.9 %  sodium chloride infusion  250 mL Intravenous Continuous Newman Pies, MD 1 mL/hr at 02/23/18 1629 250 mL at 02/23/18 1629  . acetaminophen (TYLENOL) tablet 650 mg  650 mg Oral Q4H PRN Newman Pies, MD       Or  . acetaminophen (TYLENOL) suppository 650 mg  650 mg Rectal Q4H PRN Newman Pies, MD      . bisacodyl (DULCOLAX) suppository 10 mg  10 mg Rectal Daily PRN Newman Pies, MD      . dicyclomine (BENTYL) tablet 20 mg  20 mg Oral Q6H  Newman Pies, MD   20 mg at 02/24/18 1004  . docusate sodium (COLACE) capsule 100 mg  100 mg Oral BID Newman Pies, MD   100 mg at 02/24/18 1005  . ferrous sulfate tablet 325 mg  325 mg Oral BID WC Newman Pies, MD   325 mg at 02/24/18 1003  . FLUoxetine (PROZAC) capsule 40 mg  40 mg Oral Daily Newman Pies, MD   40 mg at 02/24/18 1005  . folic acid (FOLVITE) tablet 1 mg  1 mg Oral Daily Newman Pies, MD   1 mg at 02/24/18 1006  .  gabapentin (NEURONTIN) capsule 400 mg  400 mg Oral BID Newman Pies, MD   400 mg at 02/24/18 1007  . HYDROcodone-acetaminophen (NORCO/VICODIN) 5-325 MG per tablet 1-2 tablet  1-2 tablet Oral Q4H PRN Newman Pies, MD   2 tablet at 02/24/18 1024  . ipratropium-albuterol (DUONEB) 0.5-2.5 (3) MG/3ML nebulizer solution 3 mL  3 mL Nebulization Q6H PRN Newman Pies, MD      . lidocaine (LIDODERM) 5 % 1 patch  1 patch Transdermal Daily Newman Pies, MD   1 patch at 02/24/18 1007  . lisinopril (PRINIVIL,ZESTRIL) tablet 20 mg  20 mg Oral Daily Newman Pies, MD   20 mg at 02/24/18 1008  . menthol-cetylpyridinium (CEPACOL) lozenge 3 mg  1 lozenge Oral PRN Newman Pies, MD       Or  . phenol [facility]) mouth spray 1 spray  1 spray Mouth/Throat PRN Newman Pies, MD      . mirtazapine (REMERON) tablet 15 mg  15 mg Oral QHS Newman Pies, MD   15 mg at 02/23/18 2113  . mometasone-formoterol (DULERA) 200-5 MCG/ACT inhaler 2 puff  2 puff Inhalation BID Newman Pies, MD   2 puff at 02/24/18 0836  . montelukast (SINGULAIR) tablet 10 mg  10 mg Oral Daily Newman Pies, MD   10 mg at 02/24/18 1009  . morphine 4 MG/ML injection 4 mg  4 mg Intravenous Q2H PRN Newman Pies, MD      . ondansetron [facility]) tablet 4 mg  4 mg Oral Q6H PRN Newman Pies, MD       Or  . ondansetron [facility]) injection 4 mg  4 mg Intravenous Q6H PRN Newman Pies, MD      . oxyCODONE (Oxy IR/ROXICODONE) immediate release tablet 5 mg  5 mg Oral Q3H PRN Newman Pies, MD      . pantoprazole (PROTONIX) EC tablet 40 mg  40 mg Oral Daily Newman Pies, MD   40 mg at 02/24/18 1009  . polycarbophil (FIBERCON) tablet 625 mg  625 mg Oral Daily Newman Pies, MD   625 mg at 02/24/18 6294  . predniSONE (DELTASONE) tablet 5 mg  5 mg Oral Q breakfast Newman Pies, MD   5 mg at 02/24/18 1010  . sodium chloride flush (NS) 0.9 % injection 3 mL  3 mL Intravenous Q12H Newman Pies, MD   3 mL at  02/23/18 2113  . sodium chloride flush (NS) 0.9 % injection 3 mL  3 mL Intravenous PRN Newman Pies, MD      . thiamine (VITAMIN B-1) tablet 100 mg  100 mg Oral Daily Newman Pies, MD   100 mg at 02/24/18 1010  . tiZANidine (ZANAFLEX) tablet 2 mg  2 mg Oral Q8H PRN Newman Pies, MD      . zolpidem [facility]) tablet 5 mg  5 mg Oral QHS PRN Newman Pies, MD  Discharge Medications: Please see discharge summary for a list of discharge medications.  Relevant Imaging Results:  Relevant Lab Results:   Additional Information SS# [id number]; MRSA infection  Alexander Mt, LCSWA

## 2018-02-24 NOTE — Evaluation (Signed)
Physical Therapy Evaluation Patient Details Name: Jordan [name] MRN: [id number] DOB: 03-02-1946 Today's Date: 02/24/2018   History of Present Illness  Pt is a 72 y.o. female s/p L4 kyphoplasty. 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   Clinical Impression  Pt admitted with/for L4 compression fx, s/p Kyphoplasty.  Pt continues to need significant assist for basic transfers..  Pt currently limited functionally due to the problems listed. ( See problems list.)   Pt will benefit from PT to maximize function and safety in order to get ready for next venue listed below.     Follow Up Recommendations SNF    Equipment Recommendations  None recommended by PT    Recommendations for Other Services       Precautions / Restrictions Precautions Precautions: Fall;Back Precaution Booklet Issued: No Precaution Comments: Educated pt on back precautions Required Braces or Orthoses: Spinal Brace Spinal Brace: Thoracolumbosacral orthotic;Applied in sitting position Restrictions Weight Bearing Restrictions: No      Mobility  Bed Mobility Overal bed mobility: Needs Assistance Bed Mobility: Rolling;Sidelying to Sit Rolling: Supervision Sidelying to sit: Min assist       General bed mobility comments: cues for safest technique, use of the rail  Transfers Overall transfer level: Needs assistance Equipment used: 1 person hand held assist Transfers: Squat Pivot Transfers     Squat pivot transfers: Mod assist     General transfer comment: Mod HHA for squat pivot transfer EOB to chair going to L side, times 3  Ambulation/Gait             General Gait Details: pt not ambulatory  Stairs            Wheelchair Mobility    Modified Rankin (Stroke Patients Only)       Balance Overall balance assessment: Needs  assistance Sitting-balance support: Feet unsupported;No upper extremity supported Sitting balance-Leahy Scale: Good     Standing balance support: Bilateral upper extremity supported Standing balance-Leahy Scale: Poor                               Pertinent Vitals/Pain Pain Assessment: Faces Faces Pain Scale: Hurts whole lot Pain Location: back and L leg Pain Descriptors / Indicators: Aching;Grimacing;Guarding Pain Intervention(s): Monitored during session;Premedicated before session    Home Living Family/patient expects to be discharged to:: Skilled nursing facility Living Arrangements: Other (Comment)([facility])                    Prior Function Level of Independence: Needs assistance   Gait / Transfers Assistance Needed: Over the past 2 months pt requiring increased assist with squat pivot transfers to w/c  ADL's / Homemaking Assistance Needed: Needs assist with ADL from RN staff; prior to 2 months ago pt able to complete bathing/dressing with supervision         Hand Dominance        Extremity/Trunk Assessment   Upper Extremity Assessment Upper Extremity Assessment: Defer to OT evaluation RUE Deficits / Details: Residual weakness from prior C spine injury    Lower Extremity Assessment Lower Extremity Assessment: Generalized weakness    Cervical / Trunk Assessment Cervical / Trunk Assessment: Other exceptions Cervical / Trunk Exceptions: L4 compression fx  Communication   Communication: No difficulties  Cognition Arousal/Alertness: Awake/alert Behavior During  Therapy: WFL for tasks assessed/performed Overall Cognitive Status: Within Functional Limits for tasks assessed                                        General Comments General comments (skin integrity, edema, etc.): Reinforced back care/precautions, donning the brace, progression of activity    Exercises     Assessment/Plan    PT Assessment Patient  needs continued PT services  PT Problem List Decreased strength;Decreased activity tolerance;Decreased mobility;Decreased knowledge of use of DME;Decreased knowledge of precautions;Pain       PT Treatment Interventions Gait training;Functional mobility training;Therapeutic activities;DME instruction;Patient/family education    PT Goals (Current goals can be found in the Care Plan section)  Acute Rehab PT Goals Patient Stated Goal: decrease pain PT Goal Formulation: With patient Time For Goal Achievement: 03/10/18 Potential to Achieve Goals: Fair    Frequency Min 5X/week   Barriers to discharge        Co-evaluation               AM-PAC PT "6 Clicks" Daily Activity  Outcome Measure Difficulty turning over in bed (including adjusting bedclothes, sheets and blankets)?: A Little Difficulty moving from lying on back to sitting on the side of the bed? : Unable Difficulty sitting down on and standing up from a chair with arms (e.g., wheelchair, bedside commode, etc,.)?: Unable Help needed moving to and from a bed to chair (including a wheelchair)?: A Lot Help needed walking in hospital room?: Total Help needed climbing 3-5 steps with a railing? : Total 6 Click Score: 9    End of Session Equipment Utilized During Treatment: Back brace Activity Tolerance: Patient tolerated treatment well Patient left: in chair;with call bell/phone within reach Nurse Communication: Mobility status PT Visit Diagnosis: Unsteadiness on feet (R26.81);Muscle weakness (generalized) (M62.81);Pain Pain - part of body: (back)    Time: 3154-0086 PT Time Calculation (min) (ACUTE ONLY): 16 min   Charges:   PT Evaluation $PT Eval Moderate Complexity: 1 Mod     PT G Codes:        Mar 13, 2018  Jordan [name], PT [phone]  (pager)  Tessie Fass Nahuel Wilbert 03/13/18, 3:24 PM

## 2018-02-24 NOTE — Discharge Summary (Signed)
Physician Discharge Summary  Patient ID: Jordan [name] MRN: [id number] DOB/AGE: 73-10-1945 73 y.o.  Admit date: 02/23/2018 Discharge date: 02/24/2018  Admission Diagnoses: compression fx    Discharge Diagnoses: same   Discharged Condition: stable  Hospital Course: The patient was admitted on 02/23/2018 and taken to the operating room where the patient underwent kyphoplasty. The patient tolerated the procedure well and was taken to the recovery room and then to the floor in stable condition. The hospital course was routine. There were no complications. The wound remained clean dry and intact. Pt had appropriate back soreness. No complaints of leg pain or new N/T/W. The patient remained afebrile with stable vital signs, and tolerated a regular diet. The patient continued to increase activities, and pain was well controlled with oral pain medications.   Consults: None  Significant Diagnostic Studies:  Results for orders placed or performed during the hospital encounter of 02/23/18  Type and screen [place]  Result Value Ref Range   ABO/RH(D) A POS    Antibody Screen NEG    Sample Expiration      02/26/2018 Performed at [facility], [place] [address] [place], [place] [address]     Dg Lumbar Spine 2-3 Views  Result Date: 02/23/2018 CLINICAL DATA:  Kyphoplasty EXAM: DG C-ARM 61-120 MIN; LUMBAR SPINE - 2-3 VIEW COMPARISON:  None. FLUOROSCOPY TIME:  1 minutes 37 seconds FINDINGS: Interval L4 vertebral body augmentation with methylmethacrylate within the L4 vertebral body. 50% anterior height loss of the L4 vertebral body. IMPRESSION: Interval L4 kyphoplasty. Electronically Signed   By: Kathreen Devoid   On: 02/23/2018 09:00   Dg C-arm 1-60 Min  Result Date: 02/23/2018 CLINICAL DATA:  Kyphoplasty EXAM: DG C-ARM 61-120 MIN; LUMBAR SPINE - 2-3 VIEW COMPARISON:  None. FLUOROSCOPY TIME:  1 minutes 37 seconds FINDINGS: Interval L4 vertebral body augmentation with  methylmethacrylate within the L4 vertebral body. 50% anterior height loss of the L4 vertebral body. IMPRESSION: Interval L4 kyphoplasty. Electronically Signed   By: Kathreen Devoid   On: 02/23/2018 09:00    Antibiotics:  Anti-infectives (From admission, onward)   Start     Dose/Rate Route Frequency Ordered Stop   04/12/18 0600  ceFAZolin (ANCEF) IVPB 2g/100 mL premix     2 g 200 mL/hr over 30 Minutes Intravenous On call to O.R. 02/23/18 0554 02/23/18 0750   02/23/18 1600  ceFAZolin (ANCEF) IVPB 2g/100 mL premix     2 g 200 mL/hr over 30 Minutes Intravenous Every 8 hours 02/23/18 1017 02/24/18 0139   02/23/18 0604  ceFAZolin (ANCEF) 2-4 GM/100ML-% IVPB    Note to Pharmacy:  Maryjean Ka   : cabinet override      02/23/18 0604 02/23/18 0750      Discharge Exam: Blood pressure (!) 152/88, pulse 70, temperature 98 F (36.7 C), temperature source Oral, resp. rate 16, height 5\' 3"  (1.6 m), weight 53.2 kg (117 lb 4.6 oz), SpO2 98 %. Neurologic: Grossly normal, stable from pre-op   Discharge Medications:   Allergies as of 02/24/2018      Reactions   Cardura [doxazosin Mesylate] Hives   Barbiturates Nausea And Vomiting, Rash   Levofloxacin Rash   Sulfa Antibiotics Nausea Only, Rash      Medication List    STOP taking these medications   oxyCODONE 5 MG immediate release tablet Commonly known as:  ROXICODONE     TAKE these medications   acetaminophen 325 MG tablet Commonly known as:  TYLENOL  Take 650 mg by mouth 4 (four) times daily.   ALIGN 4 MG Caps Take 1 capsule by mouth daily.   budesonide-formoterol 160-4.5 MCG/ACT inhaler Commonly known as:  SYMBICORT Inhale 2 puffs into the lungs 2 (two) times daily.   diclofenac sodium 1 % Gel Commonly known as:  VOLTAREN Apply 4 g topically 4 (four) times daily.   dicyclomine 20 MG tablet Commonly known as:  BENTYL Take 20 mg by mouth every 6 (six) hours.   docusate sodium 100 MG capsule Commonly known as:  COLACE Take 1  capsule (100 mg total) by mouth every 12 (twelve) hours. While taking oxycodone   ferrous sulfate 325 (65 FE) MG tablet Take 325 mg by mouth 2 (two) times daily with a meal.   FLUoxetine 40 MG capsule Commonly known as:  PROZAC Take 40 mg by mouth daily.   folic acid 1 MG tablet Commonly known as:  FOLVITE Take 1 mg by mouth daily.   gabapentin 400 MG capsule Commonly known as:  NEURONTIN Take 400 mg by mouth 2 (two) times daily.   HYDROcodone-acetaminophen 5-325 MG tablet Commonly known as:  NORCO/VICODIN Take 2 tablets by mouth every 6 (six) hours as needed for moderate pain.   ipratropium-albuterol 0.5-2.5 (3) MG/3ML Soln Commonly known as:  DUONEB Take 3 mLs by nebulization every 6 (six) hours as needed (SOB).   Lidocaine 4 % Ptch Apply 1 patch topically daily. Left hip   lisinopril 20 MG tablet Commonly known as:  PRINIVIL,ZESTRIL Take 20 mg by mouth daily.   Melatonin 3 MG Tabs Take 6 mg by mouth at bedtime.   mirtazapine 15 MG tablet Commonly known as:  REMERON Take 15 mg by mouth at bedtime.   montelukast 10 MG tablet Commonly known as:  SINGULAIR Take 10 mg by mouth daily.   ondansetron 4 MG disintegrating tablet Commonly known as:  ZOFRAN-ODT Take 4 mg by mouth every 4 (four) hours as needed for nausea or vomiting.   OXYGEN Inhale 2 L into the lungs as needed (TO MAINTAIN O2 LEVELS).   pantoprazole 40 MG tablet Commonly known as:  PROTONIX Take 40 mg by mouth daily.   polycarbophil 625 MG tablet Commonly known as:  FIBERCON Take 625 mg by mouth daily.   predniSONE 5 MG tablet Commonly known as:  DELTASONE Take 5 mg by mouth daily with breakfast.   thiamine 100 MG tablet Take 100 mg by mouth daily.   tiZANidine 2 MG tablet Commonly known as:  ZANAFLEX Take 2 mg by mouth every 8 (eight) hours as needed for muscle spasms.   vitamin C 500 MG tablet Commonly known as:  ASCORBIC ACID Take 500 mg by mouth 2 (two) times daily.        Disposition: SNF   Final Dx: kyphoplasty  Discharge Instructions    Call MD for:  difficulty breathing, headache or visual disturbances   Complete by:  As directed    Call MD for:  persistant nausea and vomiting   Complete by:  As directed    Call MD for:  redness, tenderness, or signs of infection (pain, swelling, redness, odor or green/yellow discharge around incision site)   Complete by:  As directed    Call MD for:  severe uncontrolled pain   Complete by:  As directed    Call MD for:  temperature >100.4   Complete by:  As directed    Diet - low sodium heart healthy   Complete by:  As  directed    Increase activity slowly   Complete by:  As directed       Follow-up Information    Newman Pies, MD. Schedule an appointment as soon as possible for a visit in 2 week(s).   Specialty:  Neurosurgery Contact information: [address] [place] [address] [phone]            Signed: DONNAH, LEVERT 02/24/2018, 8:06 AM

## 2018-02-24 NOTE — Clinical Social Work Placement (Signed)
   CLINICAL SOCIAL WORK PLACEMENT  NOTE [facility] (formerly [facility]) RN to call report to [phone]  Date:  02/24/2018  Patient Details  Name: Jordan [name] MRN: [id number] Date of Birth: 1945-09-23  Clinical Social Work is seeking post-discharge placement for this patient at the [place] level of care (*CSW will initial, date and re-position this form in  chart as items are completed):      Patient/family provided with [facility] Work Department's list of facilities offering this level of care within the geographic area requested by the patient (or if unable, by the patient's family).  Yes   Patient/family informed of their freedom to choose among providers that offer the needed level of care, that participate in Medicare, Medicaid or managed care program needed by the patient, have an available bed and are willing to accept the patient.      Patient/family informed of [facility]'s ownership interest in [facility] and [facility], as well as of the fact that they are under no obligation to receive care at these facilities.  PASRR submitted to EDS on       PASRR number received on       Existing PASRR number confirmed on 02/24/18     FL2 transmitted to all facilities in geographic area requested by pt/family on 02/24/18     FL2 transmitted to all facilities within larger geographic area on       Patient informed that his/her managed care company has contracts with or will negotiate with certain facilities, including the following:        Yes   Patient/family informed of bed offers received.  Patient chooses bed at [facility]     Physician recommends and patient chooses bed at      Patient to be transferred to [facility] on 02/24/18.  Patient to be transferred to facility by [facility]     Patient family notified on 02/24/18 of transfer.  Name of family member notified:  pt responsible for  self     PHYSICIAN       Additional Comment:    _______________________________________________ Alexander Mt, [place] 02/24/2018, 3:11 PM

## 2018-02-24 NOTE — Clinical Social Work Note (Signed)
Clinical Social Work Assessment  Patient Details  Name: Jordan [name] MRN: [id number] Date of Birth: 20-Sep-1945  Date of referral:  02/24/18               Reason for consult:  Facility Placement, Discharge Planning                Permission sought to share information with:  Facility Sport and exercise psychologist, Family Supports Permission granted to share information::  Yes, Verbal Permission Granted  Name::        Agency::  [facility]  Relationship::     Contact Information:     Housing/Transportation Living arrangements for the past 2 months:  [place] of Information:  Patient Patient Interpreter Needed:  None Criminal Activity/Legal Involvement Pertinent to Current Situation/Hospitalization:  No - Comment as needed Significant Relationships:  Warehouse manager, [place], Friend Lives with:  Facility Resident, Roommate Do you feel safe going back to the place where you live?  Yes Need for family participation in patient care:  Yes (Comment)  Care giving concerns:  Pt LTC resident at SNF, no concerns with care there.    Social Worker assessment / plan: CSW met with pt at bedside, pt states that she has been at [facility] since 2018 following hospitalization at [facility] post fall in her driveway ("I broke my neck in 3 places). Pt states she does not mind staying at [facility], her roommate she knew prior to going there from church. Pt is from [place] and raised") and is glad she is close to people she knows and is able to have them visit.   Pt had no further questions, was aware that she had been discharged and was returning to [facility] after lunch.  Employment status:  Retired Nurse, adult PT Recommendations:  [place], [place] / Referral to community resources:  [place]  Patient/Family's Response to care:  Pt pleasantly accepted visit by CSW, had good  understanding of process to return.   Patient/Family's Understanding of and Emotional Response to Diagnosis, Current Treatment, and Prognosis:  Pt states understanding of diagnosis, current treatment and prognosis. Pt has a positive attitude about being LTC in a facility, and has pleasant/positive affect when talking about her care and future hopes.   Emotional Assessment Appearance:  Appears stated age Attitude/Demeanor/Rapport:  Engaged, Gracious Affect (typically observed):  Adaptable, Accepting, Appropriate, Pleasant Orientation:  Oriented to Self, Oriented to Place, Oriented to  Time, Oriented to Situation Alcohol / Substance use:  Not Applicable Psych involvement (Current and /or in the community):  No (Comment)  Discharge Needs  Concerns to be addressed:  Care Coordination Readmission within the last 30 days:  No Current discharge risk:  Dependent with Mobility, Physical Impairment Barriers to Discharge:  Barriers Resolved   [place] 02/24/2018, 3:07 PM

## 2018-02-24 NOTE — Evaluation (Signed)
Occupational Therapy Evaluation and Discharge Patient Details Name: Jordan [name] MRN: [id number] DOB: 1946/06/07 Today's Date: 02/24/2018    History of Present Illness Pt is a 72 y.o. female s/p L4 kyphoplasty. 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    Clinical Impression   Pt reports she has required assist with ADL and transfers recently PTA due to L leg pain. Currently pt requires min-max assist for ADL and mod assist for squat pivot transfer. Pt from SNF with plans to return to SNF for continued rehab; agree with SNF placement to maximize independence and safety with ADL and functional mobility. All further OT needs can be met at the next venue of care--will defer OT to SNF. Please re-consult if needs change. Thank you for this referral.    Follow Up Recommendations  SNF;Supervision/Assistance - 24 hour    Equipment Recommendations  None recommended by OT    Recommendations for Other Services       Precautions / Restrictions Precautions Precautions: Fall;Back Precaution Booklet Issued: No Precaution Comments: Educated pt on back precautions Required Braces or Orthoses: Spinal Brace Spinal Brace: Thoracolumbosacral orthotic;Applied in sitting position Restrictions Weight Bearing Restrictions: No      Mobility Bed Mobility Overal bed mobility: Needs Assistance Bed Mobility: Rolling;Sidelying to Sit Rolling: Supervision Sidelying to sit: Min guard       General bed mobility comments: Cues for log roll tehcnique. HOB flat with use of bed rail  Transfers Overall transfer level: Needs assistance Equipment used: 1 person hand held assist Transfers: Squat Pivot Transfers     Squat pivot transfers: Mod assist     General transfer comment: Mod HHA for squat pivot transfer EOB to chair going to L side    Balance  Overall balance assessment: Needs assistance Sitting-balance support: Feet unsupported;No upper extremity supported Sitting balance-Leahy Scale: Good     Standing balance support: Bilateral upper extremity supported Standing balance-Leahy Scale: Poor                             ADL either performed or assessed with clinical judgement   ADL Overall ADL's : Needs assistance/impaired Eating/Feeding: Set up;Sitting   Grooming: Set up;Supervision/safety;Sitting   Upper Body Bathing: Minimal assistance;Sitting   Lower Body Bathing: Maximal assistance;Sitting/lateral leans   Upper Body Dressing : Moderate assistance;Sitting   Lower Body Dressing: Maximal assistance;Sitting/lateral leans Lower Body Dressing Details (indicate cue type and reason): for brace management Toilet Transfer: Moderate assistance;Squat-pivot Toilet Transfer Details (indicate cue type and reason): Mod HHA transfering to L side; simulated by bed to chair         Functional mobility during ADLs: Moderate assistance       Vision         Perception     Praxis      Pertinent Vitals/Pain Pain Assessment: Faces Faces Pain Scale: Hurts whole lot Pain Location: back and L leg Pain Descriptors / Indicators: Aching;Grimacing;Guarding Pain Intervention(s): Monitored during session;Repositioned;Patient requesting pain meds-RN notified     Hand Dominance     Extremity/Trunk Assessment Upper Extremity Assessment Upper Extremity Assessment: RUE deficits/detail RUE Deficits / Details: Residual weakness from prior C spine injury   Lower Extremity Assessment Lower Extremity Assessment: Defer to PT evaluation   Cervical / Trunk Assessment Cervical / Trunk Assessment: Other exceptions Cervical /  Trunk Exceptions: L4 compression fx   Communication Communication Communication: No difficulties   Cognition Arousal/Alertness: Awake/alert Behavior During Therapy: WFL for tasks  assessed/performed Overall Cognitive Status: Within Functional Limits for tasks assessed                                     General Comments       Exercises     Shoulder Instructions      Home Living Family/patient expects to be discharged to:: Skilled nursing facility Living Arrangements: Other (Comment)([facility])                                      Prior Functioning/Environment Level of Independence: Needs assistance  Gait / Transfers Assistance Needed: Over the past 2 months pt requiring increased assist with squat pivot transfers to w/c ADL's / Homemaking Assistance Needed: Needs assist with ADL from RN staff; prior to 2 months ago pt able to complete bathing/dressing with supervision             OT Problem List: Decreased strength;Decreased range of motion;Decreased activity tolerance;Impaired balance (sitting and/or standing);Decreased knowledge of precautions;Pain      OT Treatment/Interventions:      OT Goals(Current goals can be found in the care plan section) Acute Rehab OT Goals Patient Stated Goal: decrease pain OT Goal Formulation: All assessment and education complete, DC therapy  OT Frequency:     Barriers to D/C:            Co-evaluation              AM-PAC PT "6 Clicks" Daily Activity     Outcome Measure Help from another person eating meals?: A Little Help from another person taking care of personal grooming?: A Little Help from another person toileting, which includes using toliet, bedpan, or urinal?: A Lot Help from another person bathing (including washing, rinsing, drying)?: A Lot Help from another person to put on and taking off regular upper body clothing?: A Lot Help from another person to put on and taking off regular lower body clothing?: A Lot 6 Click Score: 14   End of Session Equipment Utilized During Treatment: Back brace Nurse Communication: Mobility status;Patient requests pain  meds  Activity Tolerance: Patient limited by pain Patient left: in chair;with call bell/phone within reach  OT Visit Diagnosis: Other abnormalities of gait and mobility (R26.89);Unsteadiness on feet (R26.81);Pain Pain - Right/Left: Left Pain - part of body: Leg(back)                Time: 9937-1696 OT Time Calculation (min): 16 min Charges:  OT General Charges $OT Visit: 1 Visit OT Evaluation $OT Eval Moderate Complexity: 1 Mod G-Codes:      A. Ulice Brilliant, M.S., OTR/L Acute Rehab Department: [phone]  Binnie Kand 02/24/2018, 11:00 AM

## 2018-02-24 NOTE — Social Work (Signed)
CSW aware pt is here at hospital from [facility] ([facility] [facility]), pt facility requesting clinicals.   CSW to send clinicals via hub. Will follow to support discharge of pt today. Alexander Mt, [place] Work [phone]

## 2018-02-25 DIAGNOSIS — S12100S Unspecified displaced fracture of second cervical vertebra, sequela: Secondary | ICD-10-CM | POA: Diagnosis not present

## 2018-02-25 DIAGNOSIS — R2681 Unsteadiness on feet: Secondary | ICD-10-CM | POA: Diagnosis not present

## 2018-02-25 DIAGNOSIS — S12000S Unspecified displaced fracture of first cervical vertebra, sequela: Secondary | ICD-10-CM | POA: Diagnosis not present

## 2018-02-25 DIAGNOSIS — R262 Difficulty in walking, not elsewhere classified: Secondary | ICD-10-CM | POA: Diagnosis not present

## 2018-02-25 DIAGNOSIS — R1312 Dysphagia, oropharyngeal phase: Secondary | ICD-10-CM | POA: Diagnosis not present

## 2018-02-25 DIAGNOSIS — S32049S Unspecified fracture of fourth lumbar vertebra, sequela: Secondary | ICD-10-CM | POA: Diagnosis not present

## 2018-02-25 DIAGNOSIS — S14109S Unspecified injury at unspecified level of cervical spinal cord, sequela: Secondary | ICD-10-CM | POA: Diagnosis not present

## 2018-02-25 DIAGNOSIS — M6281 Muscle weakness (generalized): Secondary | ICD-10-CM | POA: Diagnosis not present

## 2018-02-26 DIAGNOSIS — M6281 Muscle weakness (generalized): Secondary | ICD-10-CM | POA: Diagnosis not present

## 2018-02-26 DIAGNOSIS — S12100S Unspecified displaced fracture of second cervical vertebra, sequela: Secondary | ICD-10-CM | POA: Diagnosis not present

## 2018-02-26 DIAGNOSIS — S12000S Unspecified displaced fracture of first cervical vertebra, sequela: Secondary | ICD-10-CM | POA: Diagnosis not present

## 2018-02-26 DIAGNOSIS — R2681 Unsteadiness on feet: Secondary | ICD-10-CM | POA: Diagnosis not present

## 2018-02-26 DIAGNOSIS — R262 Difficulty in walking, not elsewhere classified: Secondary | ICD-10-CM | POA: Diagnosis not present

## 2018-02-26 DIAGNOSIS — S32049S Unspecified fracture of fourth lumbar vertebra, sequela: Secondary | ICD-10-CM | POA: Diagnosis not present

## 2018-02-26 DIAGNOSIS — S14109S Unspecified injury at unspecified level of cervical spinal cord, sequela: Secondary | ICD-10-CM | POA: Diagnosis not present

## 2018-02-26 DIAGNOSIS — R1312 Dysphagia, oropharyngeal phase: Secondary | ICD-10-CM | POA: Diagnosis not present

## 2018-03-01 DIAGNOSIS — R262 Difficulty in walking, not elsewhere classified: Secondary | ICD-10-CM | POA: Diagnosis not present

## 2018-03-01 DIAGNOSIS — J449 Chronic obstructive pulmonary disease, unspecified: Secondary | ICD-10-CM | POA: Diagnosis not present

## 2018-03-01 DIAGNOSIS — M6281 Muscle weakness (generalized): Secondary | ICD-10-CM | POA: Diagnosis not present

## 2018-03-01 DIAGNOSIS — R2681 Unsteadiness on feet: Secondary | ICD-10-CM | POA: Diagnosis not present

## 2018-03-01 DIAGNOSIS — I10 Essential (primary) hypertension: Secondary | ICD-10-CM | POA: Diagnosis not present

## 2018-03-01 DIAGNOSIS — S14109S Unspecified injury at unspecified level of cervical spinal cord, sequela: Secondary | ICD-10-CM | POA: Diagnosis not present

## 2018-03-01 DIAGNOSIS — S12100S Unspecified displaced fracture of second cervical vertebra, sequela: Secondary | ICD-10-CM | POA: Diagnosis not present

## 2018-03-01 DIAGNOSIS — S12000S Unspecified displaced fracture of first cervical vertebra, sequela: Secondary | ICD-10-CM | POA: Diagnosis not present

## 2018-03-01 DIAGNOSIS — R1312 Dysphagia, oropharyngeal phase: Secondary | ICD-10-CM | POA: Diagnosis not present

## 2018-03-01 DIAGNOSIS — S32049S Unspecified fracture of fourth lumbar vertebra, sequela: Secondary | ICD-10-CM | POA: Diagnosis not present

## 2018-03-02 DIAGNOSIS — S14109S Unspecified injury at unspecified level of cervical spinal cord, sequela: Secondary | ICD-10-CM | POA: Diagnosis not present

## 2018-03-02 DIAGNOSIS — R1312 Dysphagia, oropharyngeal phase: Secondary | ICD-10-CM | POA: Diagnosis not present

## 2018-03-02 DIAGNOSIS — M6281 Muscle weakness (generalized): Secondary | ICD-10-CM | POA: Diagnosis not present

## 2018-03-02 DIAGNOSIS — R262 Difficulty in walking, not elsewhere classified: Secondary | ICD-10-CM | POA: Diagnosis not present

## 2018-03-02 DIAGNOSIS — R2681 Unsteadiness on feet: Secondary | ICD-10-CM | POA: Diagnosis not present

## 2018-03-02 DIAGNOSIS — S12100S Unspecified displaced fracture of second cervical vertebra, sequela: Secondary | ICD-10-CM | POA: Diagnosis not present

## 2018-03-02 DIAGNOSIS — S32049S Unspecified fracture of fourth lumbar vertebra, sequela: Secondary | ICD-10-CM | POA: Diagnosis not present

## 2018-03-02 DIAGNOSIS — S12000S Unspecified displaced fracture of first cervical vertebra, sequela: Secondary | ICD-10-CM | POA: Diagnosis not present

## 2018-03-03 DIAGNOSIS — R262 Difficulty in walking, not elsewhere classified: Secondary | ICD-10-CM | POA: Diagnosis not present

## 2018-03-03 DIAGNOSIS — S32049S Unspecified fracture of fourth lumbar vertebra, sequela: Secondary | ICD-10-CM | POA: Diagnosis not present

## 2018-03-03 DIAGNOSIS — S12000S Unspecified displaced fracture of first cervical vertebra, sequela: Secondary | ICD-10-CM | POA: Diagnosis not present

## 2018-03-03 DIAGNOSIS — S12100S Unspecified displaced fracture of second cervical vertebra, sequela: Secondary | ICD-10-CM | POA: Diagnosis not present

## 2018-03-03 DIAGNOSIS — R1312 Dysphagia, oropharyngeal phase: Secondary | ICD-10-CM | POA: Diagnosis not present

## 2018-03-03 DIAGNOSIS — R2681 Unsteadiness on feet: Secondary | ICD-10-CM | POA: Diagnosis not present

## 2018-03-03 DIAGNOSIS — M6281 Muscle weakness (generalized): Secondary | ICD-10-CM | POA: Diagnosis not present

## 2018-03-03 DIAGNOSIS — S14109S Unspecified injury at unspecified level of cervical spinal cord, sequela: Secondary | ICD-10-CM | POA: Diagnosis not present

## 2018-03-04 DIAGNOSIS — R262 Difficulty in walking, not elsewhere classified: Secondary | ICD-10-CM | POA: Diagnosis not present

## 2018-03-04 DIAGNOSIS — R2681 Unsteadiness on feet: Secondary | ICD-10-CM | POA: Diagnosis not present

## 2018-03-04 DIAGNOSIS — S12000S Unspecified displaced fracture of first cervical vertebra, sequela: Secondary | ICD-10-CM | POA: Diagnosis not present

## 2018-03-04 DIAGNOSIS — S14109S Unspecified injury at unspecified level of cervical spinal cord, sequela: Secondary | ICD-10-CM | POA: Diagnosis not present

## 2018-03-04 DIAGNOSIS — M6281 Muscle weakness (generalized): Secondary | ICD-10-CM | POA: Diagnosis not present

## 2018-03-04 DIAGNOSIS — R1312 Dysphagia, oropharyngeal phase: Secondary | ICD-10-CM | POA: Diagnosis not present

## 2018-03-04 DIAGNOSIS — S32049S Unspecified fracture of fourth lumbar vertebra, sequela: Secondary | ICD-10-CM | POA: Diagnosis not present

## 2018-03-04 DIAGNOSIS — S12100S Unspecified displaced fracture of second cervical vertebra, sequela: Secondary | ICD-10-CM | POA: Diagnosis not present

## 2018-03-05 DIAGNOSIS — S12100S Unspecified displaced fracture of second cervical vertebra, sequela: Secondary | ICD-10-CM | POA: Diagnosis not present

## 2018-03-05 DIAGNOSIS — R1312 Dysphagia, oropharyngeal phase: Secondary | ICD-10-CM | POA: Diagnosis not present

## 2018-03-05 DIAGNOSIS — S12000S Unspecified displaced fracture of first cervical vertebra, sequela: Secondary | ICD-10-CM | POA: Diagnosis not present

## 2018-03-05 DIAGNOSIS — R2681 Unsteadiness on feet: Secondary | ICD-10-CM | POA: Diagnosis not present

## 2018-03-05 DIAGNOSIS — M6281 Muscle weakness (generalized): Secondary | ICD-10-CM | POA: Diagnosis not present

## 2018-03-05 DIAGNOSIS — S32049S Unspecified fracture of fourth lumbar vertebra, sequela: Secondary | ICD-10-CM | POA: Diagnosis not present

## 2018-03-05 DIAGNOSIS — S14109S Unspecified injury at unspecified level of cervical spinal cord, sequela: Secondary | ICD-10-CM | POA: Diagnosis not present

## 2018-03-05 DIAGNOSIS — R262 Difficulty in walking, not elsewhere classified: Secondary | ICD-10-CM | POA: Diagnosis not present

## 2018-03-08 DIAGNOSIS — S32049S Unspecified fracture of fourth lumbar vertebra, sequela: Secondary | ICD-10-CM | POA: Diagnosis not present

## 2018-03-08 DIAGNOSIS — R262 Difficulty in walking, not elsewhere classified: Secondary | ICD-10-CM | POA: Diagnosis not present

## 2018-03-08 DIAGNOSIS — M6281 Muscle weakness (generalized): Secondary | ICD-10-CM | POA: Diagnosis not present

## 2018-03-08 DIAGNOSIS — S14109S Unspecified injury at unspecified level of cervical spinal cord, sequela: Secondary | ICD-10-CM | POA: Diagnosis not present

## 2018-03-08 DIAGNOSIS — R2681 Unsteadiness on feet: Secondary | ICD-10-CM | POA: Diagnosis not present

## 2018-03-08 DIAGNOSIS — R1312 Dysphagia, oropharyngeal phase: Secondary | ICD-10-CM | POA: Diagnosis not present

## 2018-03-08 DIAGNOSIS — S12000S Unspecified displaced fracture of first cervical vertebra, sequela: Secondary | ICD-10-CM | POA: Diagnosis not present

## 2018-03-09 DIAGNOSIS — R262 Difficulty in walking, not elsewhere classified: Secondary | ICD-10-CM | POA: Diagnosis not present

## 2018-03-09 DIAGNOSIS — S14109S Unspecified injury at unspecified level of cervical spinal cord, sequela: Secondary | ICD-10-CM | POA: Diagnosis not present

## 2018-03-09 DIAGNOSIS — M6281 Muscle weakness (generalized): Secondary | ICD-10-CM | POA: Diagnosis not present

## 2018-03-09 DIAGNOSIS — R2681 Unsteadiness on feet: Secondary | ICD-10-CM | POA: Diagnosis not present

## 2018-03-09 DIAGNOSIS — R1312 Dysphagia, oropharyngeal phase: Secondary | ICD-10-CM | POA: Diagnosis not present

## 2018-03-09 DIAGNOSIS — S12000S Unspecified displaced fracture of first cervical vertebra, sequela: Secondary | ICD-10-CM | POA: Diagnosis not present

## 2018-03-09 DIAGNOSIS — S32049S Unspecified fracture of fourth lumbar vertebra, sequela: Secondary | ICD-10-CM | POA: Diagnosis not present

## 2018-03-10 DIAGNOSIS — R2681 Unsteadiness on feet: Secondary | ICD-10-CM | POA: Diagnosis not present

## 2018-03-10 DIAGNOSIS — R1312 Dysphagia, oropharyngeal phase: Secondary | ICD-10-CM | POA: Diagnosis not present

## 2018-03-10 DIAGNOSIS — S32049S Unspecified fracture of fourth lumbar vertebra, sequela: Secondary | ICD-10-CM | POA: Diagnosis not present

## 2018-03-10 DIAGNOSIS — S14109S Unspecified injury at unspecified level of cervical spinal cord, sequela: Secondary | ICD-10-CM | POA: Diagnosis not present

## 2018-03-10 DIAGNOSIS — M6281 Muscle weakness (generalized): Secondary | ICD-10-CM | POA: Diagnosis not present

## 2018-03-10 DIAGNOSIS — S12000S Unspecified displaced fracture of first cervical vertebra, sequela: Secondary | ICD-10-CM | POA: Diagnosis not present

## 2018-03-10 DIAGNOSIS — R262 Difficulty in walking, not elsewhere classified: Secondary | ICD-10-CM | POA: Diagnosis not present

## 2018-03-11 DIAGNOSIS — S14109S Unspecified injury at unspecified level of cervical spinal cord, sequela: Secondary | ICD-10-CM | POA: Diagnosis not present

## 2018-03-11 DIAGNOSIS — R2681 Unsteadiness on feet: Secondary | ICD-10-CM | POA: Diagnosis not present

## 2018-03-11 DIAGNOSIS — S32049S Unspecified fracture of fourth lumbar vertebra, sequela: Secondary | ICD-10-CM | POA: Diagnosis not present

## 2018-03-11 DIAGNOSIS — S12000S Unspecified displaced fracture of first cervical vertebra, sequela: Secondary | ICD-10-CM | POA: Diagnosis not present

## 2018-03-11 DIAGNOSIS — M6281 Muscle weakness (generalized): Secondary | ICD-10-CM | POA: Diagnosis not present

## 2018-03-11 DIAGNOSIS — R262 Difficulty in walking, not elsewhere classified: Secondary | ICD-10-CM | POA: Diagnosis not present

## 2018-03-11 DIAGNOSIS — R1312 Dysphagia, oropharyngeal phase: Secondary | ICD-10-CM | POA: Diagnosis not present

## 2018-03-12 DIAGNOSIS — R2681 Unsteadiness on feet: Secondary | ICD-10-CM | POA: Diagnosis not present

## 2018-03-12 DIAGNOSIS — M6281 Muscle weakness (generalized): Secondary | ICD-10-CM | POA: Diagnosis not present

## 2018-03-12 DIAGNOSIS — S12000S Unspecified displaced fracture of first cervical vertebra, sequela: Secondary | ICD-10-CM | POA: Diagnosis not present

## 2018-03-12 DIAGNOSIS — R262 Difficulty in walking, not elsewhere classified: Secondary | ICD-10-CM | POA: Diagnosis not present

## 2018-03-12 DIAGNOSIS — S32049S Unspecified fracture of fourth lumbar vertebra, sequela: Secondary | ICD-10-CM | POA: Diagnosis not present

## 2018-03-12 DIAGNOSIS — S14109S Unspecified injury at unspecified level of cervical spinal cord, sequela: Secondary | ICD-10-CM | POA: Diagnosis not present

## 2018-03-12 DIAGNOSIS — R1312 Dysphagia, oropharyngeal phase: Secondary | ICD-10-CM | POA: Diagnosis not present

## 2018-03-13 DIAGNOSIS — S12000S Unspecified displaced fracture of first cervical vertebra, sequela: Secondary | ICD-10-CM | POA: Diagnosis not present

## 2018-03-13 DIAGNOSIS — R262 Difficulty in walking, not elsewhere classified: Secondary | ICD-10-CM | POA: Diagnosis not present

## 2018-03-13 DIAGNOSIS — R2681 Unsteadiness on feet: Secondary | ICD-10-CM | POA: Diagnosis not present

## 2018-03-13 DIAGNOSIS — M6281 Muscle weakness (generalized): Secondary | ICD-10-CM | POA: Diagnosis not present

## 2018-03-13 DIAGNOSIS — R1312 Dysphagia, oropharyngeal phase: Secondary | ICD-10-CM | POA: Diagnosis not present

## 2018-03-13 DIAGNOSIS — S14109S Unspecified injury at unspecified level of cervical spinal cord, sequela: Secondary | ICD-10-CM | POA: Diagnosis not present

## 2018-03-13 DIAGNOSIS — S32049S Unspecified fracture of fourth lumbar vertebra, sequela: Secondary | ICD-10-CM | POA: Diagnosis not present

## 2018-03-15 DIAGNOSIS — R262 Difficulty in walking, not elsewhere classified: Secondary | ICD-10-CM | POA: Diagnosis not present

## 2018-03-15 DIAGNOSIS — R2681 Unsteadiness on feet: Secondary | ICD-10-CM | POA: Diagnosis not present

## 2018-03-15 DIAGNOSIS — M25511 Pain in right shoulder: Secondary | ICD-10-CM | POA: Diagnosis not present

## 2018-03-15 DIAGNOSIS — S12000S Unspecified displaced fracture of first cervical vertebra, sequela: Secondary | ICD-10-CM | POA: Diagnosis not present

## 2018-03-15 DIAGNOSIS — S32049S Unspecified fracture of fourth lumbar vertebra, sequela: Secondary | ICD-10-CM | POA: Diagnosis not present

## 2018-03-15 DIAGNOSIS — R1312 Dysphagia, oropharyngeal phase: Secondary | ICD-10-CM | POA: Diagnosis not present

## 2018-03-15 DIAGNOSIS — S14109S Unspecified injury at unspecified level of cervical spinal cord, sequela: Secondary | ICD-10-CM | POA: Diagnosis not present

## 2018-03-15 DIAGNOSIS — M6281 Muscle weakness (generalized): Secondary | ICD-10-CM | POA: Diagnosis not present

## 2018-03-16 DIAGNOSIS — S14109S Unspecified injury at unspecified level of cervical spinal cord, sequela: Secondary | ICD-10-CM | POA: Diagnosis not present

## 2018-03-16 DIAGNOSIS — S12000S Unspecified displaced fracture of first cervical vertebra, sequela: Secondary | ICD-10-CM | POA: Diagnosis not present

## 2018-03-16 DIAGNOSIS — M6281 Muscle weakness (generalized): Secondary | ICD-10-CM | POA: Diagnosis not present

## 2018-03-16 DIAGNOSIS — R1312 Dysphagia, oropharyngeal phase: Secondary | ICD-10-CM | POA: Diagnosis not present

## 2018-03-16 DIAGNOSIS — R262 Difficulty in walking, not elsewhere classified: Secondary | ICD-10-CM | POA: Diagnosis not present

## 2018-03-16 DIAGNOSIS — S32049S Unspecified fracture of fourth lumbar vertebra, sequela: Secondary | ICD-10-CM | POA: Diagnosis not present

## 2018-03-16 DIAGNOSIS — R2681 Unsteadiness on feet: Secondary | ICD-10-CM | POA: Diagnosis not present

## 2018-03-17 DIAGNOSIS — S14109S Unspecified injury at unspecified level of cervical spinal cord, sequela: Secondary | ICD-10-CM | POA: Diagnosis not present

## 2018-03-17 DIAGNOSIS — S32049S Unspecified fracture of fourth lumbar vertebra, sequela: Secondary | ICD-10-CM | POA: Diagnosis not present

## 2018-03-17 DIAGNOSIS — M6281 Muscle weakness (generalized): Secondary | ICD-10-CM | POA: Diagnosis not present

## 2018-03-17 DIAGNOSIS — S12000S Unspecified displaced fracture of first cervical vertebra, sequela: Secondary | ICD-10-CM | POA: Diagnosis not present

## 2018-03-17 DIAGNOSIS — M25561 Pain in right knee: Secondary | ICD-10-CM | POA: Diagnosis not present

## 2018-03-17 DIAGNOSIS — R2681 Unsteadiness on feet: Secondary | ICD-10-CM | POA: Diagnosis not present

## 2018-03-17 DIAGNOSIS — M25511 Pain in right shoulder: Secondary | ICD-10-CM | POA: Diagnosis not present

## 2018-03-17 DIAGNOSIS — R262 Difficulty in walking, not elsewhere classified: Secondary | ICD-10-CM | POA: Diagnosis not present

## 2018-03-17 DIAGNOSIS — R1312 Dysphagia, oropharyngeal phase: Secondary | ICD-10-CM | POA: Diagnosis not present

## 2018-03-18 DIAGNOSIS — S14109S Unspecified injury at unspecified level of cervical spinal cord, sequela: Secondary | ICD-10-CM | POA: Diagnosis not present

## 2018-03-18 DIAGNOSIS — S32049S Unspecified fracture of fourth lumbar vertebra, sequela: Secondary | ICD-10-CM | POA: Diagnosis not present

## 2018-03-18 DIAGNOSIS — R262 Difficulty in walking, not elsewhere classified: Secondary | ICD-10-CM | POA: Diagnosis not present

## 2018-03-18 DIAGNOSIS — S12000S Unspecified displaced fracture of first cervical vertebra, sequela: Secondary | ICD-10-CM | POA: Diagnosis not present

## 2018-03-18 DIAGNOSIS — M6281 Muscle weakness (generalized): Secondary | ICD-10-CM | POA: Diagnosis not present

## 2018-03-18 DIAGNOSIS — R2681 Unsteadiness on feet: Secondary | ICD-10-CM | POA: Diagnosis not present

## 2018-03-18 DIAGNOSIS — R1312 Dysphagia, oropharyngeal phase: Secondary | ICD-10-CM | POA: Diagnosis not present

## 2018-03-19 DIAGNOSIS — R2681 Unsteadiness on feet: Secondary | ICD-10-CM | POA: Diagnosis not present

## 2018-03-19 DIAGNOSIS — S32049S Unspecified fracture of fourth lumbar vertebra, sequela: Secondary | ICD-10-CM | POA: Diagnosis not present

## 2018-03-19 DIAGNOSIS — S12000S Unspecified displaced fracture of first cervical vertebra, sequela: Secondary | ICD-10-CM | POA: Diagnosis not present

## 2018-03-19 DIAGNOSIS — R262 Difficulty in walking, not elsewhere classified: Secondary | ICD-10-CM | POA: Diagnosis not present

## 2018-03-19 DIAGNOSIS — R1312 Dysphagia, oropharyngeal phase: Secondary | ICD-10-CM | POA: Diagnosis not present

## 2018-03-19 DIAGNOSIS — S14109S Unspecified injury at unspecified level of cervical spinal cord, sequela: Secondary | ICD-10-CM | POA: Diagnosis not present

## 2018-03-19 DIAGNOSIS — M6281 Muscle weakness (generalized): Secondary | ICD-10-CM | POA: Diagnosis not present

## 2018-03-22 DIAGNOSIS — S32049S Unspecified fracture of fourth lumbar vertebra, sequela: Secondary | ICD-10-CM | POA: Diagnosis not present

## 2018-03-22 DIAGNOSIS — M6281 Muscle weakness (generalized): Secondary | ICD-10-CM | POA: Diagnosis not present

## 2018-03-22 DIAGNOSIS — R1312 Dysphagia, oropharyngeal phase: Secondary | ICD-10-CM | POA: Diagnosis not present

## 2018-03-22 DIAGNOSIS — S14109S Unspecified injury at unspecified level of cervical spinal cord, sequela: Secondary | ICD-10-CM | POA: Diagnosis not present

## 2018-03-22 DIAGNOSIS — S12000S Unspecified displaced fracture of first cervical vertebra, sequela: Secondary | ICD-10-CM | POA: Diagnosis not present

## 2018-03-22 DIAGNOSIS — R2681 Unsteadiness on feet: Secondary | ICD-10-CM | POA: Diagnosis not present

## 2018-03-22 DIAGNOSIS — R262 Difficulty in walking, not elsewhere classified: Secondary | ICD-10-CM | POA: Diagnosis not present

## 2018-03-23 DIAGNOSIS — M6281 Muscle weakness (generalized): Secondary | ICD-10-CM | POA: Diagnosis not present

## 2018-03-23 DIAGNOSIS — S12000S Unspecified displaced fracture of first cervical vertebra, sequela: Secondary | ICD-10-CM | POA: Diagnosis not present

## 2018-03-23 DIAGNOSIS — S32049S Unspecified fracture of fourth lumbar vertebra, sequela: Secondary | ICD-10-CM | POA: Diagnosis not present

## 2018-03-23 DIAGNOSIS — S14109S Unspecified injury at unspecified level of cervical spinal cord, sequela: Secondary | ICD-10-CM | POA: Diagnosis not present

## 2018-03-23 DIAGNOSIS — R262 Difficulty in walking, not elsewhere classified: Secondary | ICD-10-CM | POA: Diagnosis not present

## 2018-03-23 DIAGNOSIS — R2681 Unsteadiness on feet: Secondary | ICD-10-CM | POA: Diagnosis not present

## 2018-03-23 DIAGNOSIS — R1312 Dysphagia, oropharyngeal phase: Secondary | ICD-10-CM | POA: Diagnosis not present

## 2018-03-24 DIAGNOSIS — R2681 Unsteadiness on feet: Secondary | ICD-10-CM | POA: Diagnosis not present

## 2018-03-24 DIAGNOSIS — M6281 Muscle weakness (generalized): Secondary | ICD-10-CM | POA: Diagnosis not present

## 2018-03-24 DIAGNOSIS — R1312 Dysphagia, oropharyngeal phase: Secondary | ICD-10-CM | POA: Diagnosis not present

## 2018-03-24 DIAGNOSIS — S12000S Unspecified displaced fracture of first cervical vertebra, sequela: Secondary | ICD-10-CM | POA: Diagnosis not present

## 2018-03-24 DIAGNOSIS — S32049S Unspecified fracture of fourth lumbar vertebra, sequela: Secondary | ICD-10-CM | POA: Diagnosis not present

## 2018-03-24 DIAGNOSIS — S14109S Unspecified injury at unspecified level of cervical spinal cord, sequela: Secondary | ICD-10-CM | POA: Diagnosis not present

## 2018-03-24 DIAGNOSIS — R262 Difficulty in walking, not elsewhere classified: Secondary | ICD-10-CM | POA: Diagnosis not present

## 2018-03-25 DIAGNOSIS — S32049S Unspecified fracture of fourth lumbar vertebra, sequela: Secondary | ICD-10-CM | POA: Diagnosis not present

## 2018-03-25 DIAGNOSIS — M6281 Muscle weakness (generalized): Secondary | ICD-10-CM | POA: Diagnosis not present

## 2018-03-25 DIAGNOSIS — R1312 Dysphagia, oropharyngeal phase: Secondary | ICD-10-CM | POA: Diagnosis not present

## 2018-03-25 DIAGNOSIS — R2681 Unsteadiness on feet: Secondary | ICD-10-CM | POA: Diagnosis not present

## 2018-03-25 DIAGNOSIS — S12000S Unspecified displaced fracture of first cervical vertebra, sequela: Secondary | ICD-10-CM | POA: Diagnosis not present

## 2018-03-25 DIAGNOSIS — S14109S Unspecified injury at unspecified level of cervical spinal cord, sequela: Secondary | ICD-10-CM | POA: Diagnosis not present

## 2018-03-25 DIAGNOSIS — R262 Difficulty in walking, not elsewhere classified: Secondary | ICD-10-CM | POA: Diagnosis not present

## 2018-03-26 DIAGNOSIS — S14109S Unspecified injury at unspecified level of cervical spinal cord, sequela: Secondary | ICD-10-CM | POA: Diagnosis not present

## 2018-03-26 DIAGNOSIS — R2681 Unsteadiness on feet: Secondary | ICD-10-CM | POA: Diagnosis not present

## 2018-03-26 DIAGNOSIS — R262 Difficulty in walking, not elsewhere classified: Secondary | ICD-10-CM | POA: Diagnosis not present

## 2018-03-26 DIAGNOSIS — S32040G Wedge compression fracture of fourth lumbar vertebra, subsequent encounter for fracture with delayed healing: Secondary | ICD-10-CM | POA: Diagnosis not present

## 2018-03-26 DIAGNOSIS — S12000S Unspecified displaced fracture of first cervical vertebra, sequela: Secondary | ICD-10-CM | POA: Diagnosis not present

## 2018-03-26 DIAGNOSIS — M6281 Muscle weakness (generalized): Secondary | ICD-10-CM | POA: Diagnosis not present

## 2018-03-26 DIAGNOSIS — S32049S Unspecified fracture of fourth lumbar vertebra, sequela: Secondary | ICD-10-CM | POA: Diagnosis not present

## 2018-03-26 DIAGNOSIS — R1312 Dysphagia, oropharyngeal phase: Secondary | ICD-10-CM | POA: Diagnosis not present

## 2018-03-29 DIAGNOSIS — S12000S Unspecified displaced fracture of first cervical vertebra, sequela: Secondary | ICD-10-CM | POA: Diagnosis not present

## 2018-03-29 DIAGNOSIS — R1312 Dysphagia, oropharyngeal phase: Secondary | ICD-10-CM | POA: Diagnosis not present

## 2018-03-29 DIAGNOSIS — R2681 Unsteadiness on feet: Secondary | ICD-10-CM | POA: Diagnosis not present

## 2018-03-29 DIAGNOSIS — S14109S Unspecified injury at unspecified level of cervical spinal cord, sequela: Secondary | ICD-10-CM | POA: Diagnosis not present

## 2018-03-29 DIAGNOSIS — R262 Difficulty in walking, not elsewhere classified: Secondary | ICD-10-CM | POA: Diagnosis not present

## 2018-03-29 DIAGNOSIS — M6281 Muscle weakness (generalized): Secondary | ICD-10-CM | POA: Diagnosis not present

## 2018-03-29 DIAGNOSIS — S32049S Unspecified fracture of fourth lumbar vertebra, sequela: Secondary | ICD-10-CM | POA: Diagnosis not present

## 2018-03-30 DIAGNOSIS — S12000S Unspecified displaced fracture of first cervical vertebra, sequela: Secondary | ICD-10-CM | POA: Diagnosis not present

## 2018-03-30 DIAGNOSIS — R1312 Dysphagia, oropharyngeal phase: Secondary | ICD-10-CM | POA: Diagnosis not present

## 2018-03-30 DIAGNOSIS — R262 Difficulty in walking, not elsewhere classified: Secondary | ICD-10-CM | POA: Diagnosis not present

## 2018-03-30 DIAGNOSIS — R2681 Unsteadiness on feet: Secondary | ICD-10-CM | POA: Diagnosis not present

## 2018-03-30 DIAGNOSIS — S14109S Unspecified injury at unspecified level of cervical spinal cord, sequela: Secondary | ICD-10-CM | POA: Diagnosis not present

## 2018-03-30 DIAGNOSIS — M6281 Muscle weakness (generalized): Secondary | ICD-10-CM | POA: Diagnosis not present

## 2018-03-30 DIAGNOSIS — S32049S Unspecified fracture of fourth lumbar vertebra, sequela: Secondary | ICD-10-CM | POA: Diagnosis not present

## 2018-03-31 DIAGNOSIS — R2681 Unsteadiness on feet: Secondary | ICD-10-CM | POA: Diagnosis not present

## 2018-03-31 DIAGNOSIS — S32049S Unspecified fracture of fourth lumbar vertebra, sequela: Secondary | ICD-10-CM | POA: Diagnosis not present

## 2018-03-31 DIAGNOSIS — R1312 Dysphagia, oropharyngeal phase: Secondary | ICD-10-CM | POA: Diagnosis not present

## 2018-03-31 DIAGNOSIS — S12000S Unspecified displaced fracture of first cervical vertebra, sequela: Secondary | ICD-10-CM | POA: Diagnosis not present

## 2018-03-31 DIAGNOSIS — M6281 Muscle weakness (generalized): Secondary | ICD-10-CM | POA: Diagnosis not present

## 2018-03-31 DIAGNOSIS — R262 Difficulty in walking, not elsewhere classified: Secondary | ICD-10-CM | POA: Diagnosis not present

## 2018-03-31 DIAGNOSIS — S14109S Unspecified injury at unspecified level of cervical spinal cord, sequela: Secondary | ICD-10-CM | POA: Diagnosis not present

## 2018-04-01 DIAGNOSIS — S14109S Unspecified injury at unspecified level of cervical spinal cord, sequela: Secondary | ICD-10-CM | POA: Diagnosis not present

## 2018-04-01 DIAGNOSIS — S32049S Unspecified fracture of fourth lumbar vertebra, sequela: Secondary | ICD-10-CM | POA: Diagnosis not present

## 2018-04-01 DIAGNOSIS — R1312 Dysphagia, oropharyngeal phase: Secondary | ICD-10-CM | POA: Diagnosis not present

## 2018-04-01 DIAGNOSIS — M6281 Muscle weakness (generalized): Secondary | ICD-10-CM | POA: Diagnosis not present

## 2018-04-01 DIAGNOSIS — R2681 Unsteadiness on feet: Secondary | ICD-10-CM | POA: Diagnosis not present

## 2018-04-01 DIAGNOSIS — R262 Difficulty in walking, not elsewhere classified: Secondary | ICD-10-CM | POA: Diagnosis not present

## 2018-04-01 DIAGNOSIS — S12000S Unspecified displaced fracture of first cervical vertebra, sequela: Secondary | ICD-10-CM | POA: Diagnosis not present

## 2018-04-02 DIAGNOSIS — R2681 Unsteadiness on feet: Secondary | ICD-10-CM | POA: Diagnosis not present

## 2018-04-02 DIAGNOSIS — R1312 Dysphagia, oropharyngeal phase: Secondary | ICD-10-CM | POA: Diagnosis not present

## 2018-04-02 DIAGNOSIS — S12000S Unspecified displaced fracture of first cervical vertebra, sequela: Secondary | ICD-10-CM | POA: Diagnosis not present

## 2018-04-02 DIAGNOSIS — S32049S Unspecified fracture of fourth lumbar vertebra, sequela: Secondary | ICD-10-CM | POA: Diagnosis not present

## 2018-04-02 DIAGNOSIS — M6281 Muscle weakness (generalized): Secondary | ICD-10-CM | POA: Diagnosis not present

## 2018-04-02 DIAGNOSIS — R262 Difficulty in walking, not elsewhere classified: Secondary | ICD-10-CM | POA: Diagnosis not present

## 2018-04-02 DIAGNOSIS — S14109S Unspecified injury at unspecified level of cervical spinal cord, sequela: Secondary | ICD-10-CM | POA: Diagnosis not present

## 2018-04-05 DIAGNOSIS — M6281 Muscle weakness (generalized): Secondary | ICD-10-CM | POA: Diagnosis not present

## 2018-04-05 DIAGNOSIS — S32049S Unspecified fracture of fourth lumbar vertebra, sequela: Secondary | ICD-10-CM | POA: Diagnosis not present

## 2018-04-05 DIAGNOSIS — S14109S Unspecified injury at unspecified level of cervical spinal cord, sequela: Secondary | ICD-10-CM | POA: Diagnosis not present

## 2018-04-05 DIAGNOSIS — R262 Difficulty in walking, not elsewhere classified: Secondary | ICD-10-CM | POA: Diagnosis not present

## 2018-04-05 DIAGNOSIS — S12000S Unspecified displaced fracture of first cervical vertebra, sequela: Secondary | ICD-10-CM | POA: Diagnosis not present

## 2018-04-05 DIAGNOSIS — R2681 Unsteadiness on feet: Secondary | ICD-10-CM | POA: Diagnosis not present

## 2018-04-05 DIAGNOSIS — R1312 Dysphagia, oropharyngeal phase: Secondary | ICD-10-CM | POA: Diagnosis not present

## 2018-04-06 DIAGNOSIS — D649 Anemia, unspecified: Secondary | ICD-10-CM | POA: Diagnosis not present

## 2018-04-06 DIAGNOSIS — M6281 Muscle weakness (generalized): Secondary | ICD-10-CM | POA: Diagnosis not present

## 2018-04-06 DIAGNOSIS — R262 Difficulty in walking, not elsewhere classified: Secondary | ICD-10-CM | POA: Diagnosis not present

## 2018-04-06 DIAGNOSIS — S14109S Unspecified injury at unspecified level of cervical spinal cord, sequela: Secondary | ICD-10-CM | POA: Diagnosis not present

## 2018-04-06 DIAGNOSIS — R1312 Dysphagia, oropharyngeal phase: Secondary | ICD-10-CM | POA: Diagnosis not present

## 2018-04-06 DIAGNOSIS — S12000S Unspecified displaced fracture of first cervical vertebra, sequela: Secondary | ICD-10-CM | POA: Diagnosis not present

## 2018-04-06 DIAGNOSIS — S32049S Unspecified fracture of fourth lumbar vertebra, sequela: Secondary | ICD-10-CM | POA: Diagnosis not present

## 2018-04-06 DIAGNOSIS — R2681 Unsteadiness on feet: Secondary | ICD-10-CM | POA: Diagnosis not present

## 2018-04-07 DIAGNOSIS — S14109S Unspecified injury at unspecified level of cervical spinal cord, sequela: Secondary | ICD-10-CM | POA: Diagnosis not present

## 2018-04-07 DIAGNOSIS — J449 Chronic obstructive pulmonary disease, unspecified: Secondary | ICD-10-CM | POA: Diagnosis not present

## 2018-04-07 DIAGNOSIS — S32049S Unspecified fracture of fourth lumbar vertebra, sequela: Secondary | ICD-10-CM | POA: Diagnosis not present

## 2018-04-07 DIAGNOSIS — S12000S Unspecified displaced fracture of first cervical vertebra, sequela: Secondary | ICD-10-CM | POA: Diagnosis not present

## 2018-04-07 DIAGNOSIS — R2681 Unsteadiness on feet: Secondary | ICD-10-CM | POA: Diagnosis not present

## 2018-04-07 DIAGNOSIS — M6281 Muscle weakness (generalized): Secondary | ICD-10-CM | POA: Diagnosis not present

## 2018-04-07 DIAGNOSIS — R262 Difficulty in walking, not elsewhere classified: Secondary | ICD-10-CM | POA: Diagnosis not present

## 2018-04-07 DIAGNOSIS — R1312 Dysphagia, oropharyngeal phase: Secondary | ICD-10-CM | POA: Diagnosis not present

## 2018-05-04 DIAGNOSIS — M25571 Pain in right ankle and joints of right foot: Secondary | ICD-10-CM | POA: Diagnosis not present

## 2018-05-04 DIAGNOSIS — M25572 Pain in left ankle and joints of left foot: Secondary | ICD-10-CM | POA: Diagnosis not present

## 2018-05-04 DIAGNOSIS — J449 Chronic obstructive pulmonary disease, unspecified: Secondary | ICD-10-CM | POA: Diagnosis not present

## 2018-05-04 DIAGNOSIS — F329 Major depressive disorder, single episode, unspecified: Secondary | ICD-10-CM | POA: Diagnosis not present

## 2018-05-11 DIAGNOSIS — J449 Chronic obstructive pulmonary disease, unspecified: Secondary | ICD-10-CM | POA: Diagnosis not present

## 2018-05-11 DIAGNOSIS — J441 Chronic obstructive pulmonary disease with (acute) exacerbation: Secondary | ICD-10-CM | POA: Diagnosis not present

## 2018-06-01 DIAGNOSIS — M25572 Pain in left ankle and joints of left foot: Secondary | ICD-10-CM | POA: Diagnosis not present

## 2018-06-01 DIAGNOSIS — M25571 Pain in right ankle and joints of right foot: Secondary | ICD-10-CM | POA: Diagnosis not present

## 2018-06-18 ENCOUNTER — Encounter: Payer: Self-pay | Admitting: Internal Medicine

## 2018-07-04 ENCOUNTER — Emergency Department (HOSPITAL_COMMUNITY): Payer: Medicare Other

## 2018-07-04 ENCOUNTER — Other Ambulatory Visit: Payer: Self-pay

## 2018-07-04 ENCOUNTER — Encounter (HOSPITAL_COMMUNITY): Payer: Self-pay | Admitting: Emergency Medicine

## 2018-07-04 ENCOUNTER — Inpatient Hospital Stay (HOSPITAL_COMMUNITY)
Admission: EM | Admit: 2018-07-04 | Discharge: 2018-07-19 | DRG: 371 | Disposition: A | Discharge status: Other Acute Inpatient Hospital | Payer: Medicare Other | Source: Skilled Nursing Facility | Attending: Family Medicine | Admitting: Family Medicine

## 2018-07-04 DIAGNOSIS — I10 Essential (primary) hypertension: Secondary | ICD-10-CM | POA: Diagnosis not present

## 2018-07-04 DIAGNOSIS — Z79899 Other long term (current) drug therapy: Secondary | ICD-10-CM

## 2018-07-04 DIAGNOSIS — J449 Chronic obstructive pulmonary disease, unspecified: Secondary | ICD-10-CM

## 2018-07-04 DIAGNOSIS — E785 Hyperlipidemia, unspecified: Secondary | ICD-10-CM | POA: Diagnosis present

## 2018-07-04 DIAGNOSIS — I471 Supraventricular tachycardia: Secondary | ICD-10-CM | POA: Diagnosis not present

## 2018-07-04 DIAGNOSIS — Z7951 Long term (current) use of inhaled steroids: Secondary | ICD-10-CM

## 2018-07-04 DIAGNOSIS — A0472 Enterocolitis due to Clostridium difficile, not specified as recurrent: Principal | ICD-10-CM | POA: Diagnosis present

## 2018-07-04 DIAGNOSIS — R9431 Abnormal electrocardiogram [ECG] [EKG]: Secondary | ICD-10-CM | POA: Diagnosis not present

## 2018-07-04 DIAGNOSIS — N139 Obstructive and reflux uropathy, unspecified: Secondary | ICD-10-CM | POA: Diagnosis present

## 2018-07-04 DIAGNOSIS — Z8673 Personal history of transient ischemic attack (TIA), and cerebral infarction without residual deficits: Secondary | ICD-10-CM

## 2018-07-04 DIAGNOSIS — N189 Chronic kidney disease, unspecified: Secondary | ICD-10-CM | POA: Diagnosis present

## 2018-07-04 DIAGNOSIS — N179 Acute kidney failure, unspecified: Secondary | ICD-10-CM

## 2018-07-04 DIAGNOSIS — R2681 Unsteadiness on feet: Secondary | ICD-10-CM | POA: Diagnosis present

## 2018-07-04 DIAGNOSIS — S32000A Wedge compression fracture of unspecified lumbar vertebra, initial encounter for closed fracture: Secondary | ICD-10-CM | POA: Diagnosis present

## 2018-07-04 DIAGNOSIS — E43 Unspecified severe protein-calorie malnutrition: Secondary | ICD-10-CM | POA: Diagnosis not present

## 2018-07-04 DIAGNOSIS — I252 Old myocardial infarction: Secondary | ICD-10-CM | POA: Diagnosis not present

## 2018-07-04 DIAGNOSIS — E872 Acidosis: Secondary | ICD-10-CM | POA: Diagnosis not present

## 2018-07-04 DIAGNOSIS — N319 Neuromuscular dysfunction of bladder, unspecified: Secondary | ICD-10-CM | POA: Diagnosis present

## 2018-07-04 DIAGNOSIS — A09 Infectious gastroenteritis and colitis, unspecified: Secondary | ICD-10-CM | POA: Diagnosis present

## 2018-07-04 DIAGNOSIS — S12000S Unspecified displaced fracture of first cervical vertebra, sequela: Secondary | ICD-10-CM | POA: Diagnosis not present

## 2018-07-04 DIAGNOSIS — I129 Hypertensive chronic kidney disease with stage 1 through stage 4 chronic kidney disease, or unspecified chronic kidney disease: Secondary | ICD-10-CM | POA: Diagnosis present

## 2018-07-04 DIAGNOSIS — K560 Paralytic ileus: Secondary | ICD-10-CM | POA: Diagnosis present

## 2018-07-04 DIAGNOSIS — I959 Hypotension, unspecified: Secondary | ICD-10-CM | POA: Diagnosis not present

## 2018-07-04 DIAGNOSIS — M6281 Muscle weakness (generalized): Secondary | ICD-10-CM | POA: Diagnosis present

## 2018-07-04 DIAGNOSIS — S12100S Unspecified displaced fracture of second cervical vertebra, sequela: Secondary | ICD-10-CM | POA: Diagnosis not present

## 2018-07-04 DIAGNOSIS — N183 Chronic kidney disease, stage 3 (moderate): Secondary | ICD-10-CM | POA: Diagnosis present

## 2018-07-04 DIAGNOSIS — K648 Other hemorrhoids: Secondary | ICD-10-CM | POA: Diagnosis present

## 2018-07-04 DIAGNOSIS — Z9981 Dependence on supplemental oxygen: Secondary | ICD-10-CM | POA: Diagnosis not present

## 2018-07-04 DIAGNOSIS — R140 Abdominal distension (gaseous): Secondary | ICD-10-CM | POA: Diagnosis not present

## 2018-07-04 DIAGNOSIS — Z6820 Body mass index (BMI) 20.0-20.9, adult: Secondary | ICD-10-CM

## 2018-07-04 DIAGNOSIS — R262 Difficulty in walking, not elsewhere classified: Secondary | ICD-10-CM | POA: Diagnosis present

## 2018-07-04 DIAGNOSIS — F339 Major depressive disorder, recurrent, unspecified: Secondary | ICD-10-CM | POA: Diagnosis present

## 2018-07-04 DIAGNOSIS — K5939 Other megacolon: Secondary | ICD-10-CM

## 2018-07-04 DIAGNOSIS — S32049S Unspecified fracture of fourth lumbar vertebra, sequela: Secondary | ICD-10-CM | POA: Diagnosis not present

## 2018-07-04 DIAGNOSIS — R279 Unspecified lack of coordination: Secondary | ICD-10-CM | POA: Diagnosis present

## 2018-07-04 DIAGNOSIS — E860 Dehydration: Secondary | ICD-10-CM | POA: Diagnosis present

## 2018-07-04 DIAGNOSIS — N170 Acute kidney failure with tubular necrosis: Secondary | ICD-10-CM | POA: Diagnosis not present

## 2018-07-04 DIAGNOSIS — Z791 Long term (current) use of non-steroidal anti-inflammatories (NSAID): Secondary | ICD-10-CM

## 2018-07-04 DIAGNOSIS — D649 Anemia, unspecified: Secondary | ICD-10-CM | POA: Diagnosis present

## 2018-07-04 DIAGNOSIS — E876 Hypokalemia: Secondary | ICD-10-CM | POA: Diagnosis present

## 2018-07-04 DIAGNOSIS — R269 Unspecified abnormalities of gait and mobility: Secondary | ICD-10-CM | POA: Diagnosis present

## 2018-07-04 DIAGNOSIS — Z7952 Long term (current) use of systemic steroids: Secondary | ICD-10-CM

## 2018-07-04 DIAGNOSIS — K589 Irritable bowel syndrome without diarrhea: Secondary | ICD-10-CM | POA: Diagnosis present

## 2018-07-04 DIAGNOSIS — J45909 Unspecified asthma, uncomplicated: Secondary | ICD-10-CM | POA: Diagnosis present

## 2018-07-04 DIAGNOSIS — Z9049 Acquired absence of other specified parts of digestive tract: Secondary | ICD-10-CM

## 2018-07-04 DIAGNOSIS — R5381 Other malaise: Secondary | ICD-10-CM | POA: Diagnosis not present

## 2018-07-04 DIAGNOSIS — Z743 Need for continuous supervision: Secondary | ICD-10-CM | POA: Diagnosis not present

## 2018-07-04 DIAGNOSIS — L8990 Pressure ulcer of unspecified site, unspecified stage: Secondary | ICD-10-CM

## 2018-07-04 DIAGNOSIS — K567 Ileus, unspecified: Secondary | ICD-10-CM

## 2018-07-04 DIAGNOSIS — R296 Repeated falls: Secondary | ICD-10-CM | POA: Diagnosis present

## 2018-07-04 DIAGNOSIS — R52 Pain, unspecified: Secondary | ICD-10-CM | POA: Diagnosis present

## 2018-07-04 DIAGNOSIS — S14109S Unspecified injury at unspecified level of cervical spinal cord, sequela: Secondary | ICD-10-CM | POA: Diagnosis not present

## 2018-07-04 DIAGNOSIS — K592 Neurogenic bowel, not elsewhere classified: Secondary | ICD-10-CM | POA: Diagnosis present

## 2018-07-04 LAB — LIPASE, BLOOD: Lipase: 24 U/L (ref 11–51)

## 2018-07-04 LAB — URINALYSIS, ROUTINE W REFLEX MICROSCOPIC
Bilirubin Urine: NEGATIVE
Glucose, UA: NEGATIVE mg/dL
Hgb urine dipstick: NEGATIVE
Ketones, ur: 5 mg/dL — AB
Leukocytes, UA: NEGATIVE
Nitrite: NEGATIVE
Protein, ur: NEGATIVE mg/dL
Specific Gravity, Urine: 1.016 (ref 1.005–1.030)
pH: 5 (ref 5.0–8.0)

## 2018-07-04 LAB — MAGNESIUM: Magnesium: 3.3 mg/dL — ABNORMAL HIGH (ref 1.7–2.4)

## 2018-07-04 LAB — COMPREHENSIVE METABOLIC PANEL
ALT: 15 U/L (ref 0–44)
AST: 12 U/L — ABNORMAL LOW (ref 15–41)
Albumin: 2.5 g/dL — ABNORMAL LOW (ref 3.5–5.0)
Alkaline Phosphatase: 69 U/L (ref 38–126)
Anion gap: 13 (ref 5–15)
BUN: 76 mg/dL — ABNORMAL HIGH (ref 8–23)
CO2: 17 mmol/L — ABNORMAL LOW (ref 22–32)
Calcium: 7.8 mg/dL — ABNORMAL LOW (ref 8.9–10.3)
Chloride: 105 mmol/L (ref 98–111)
Creatinine, Ser: 4.05 mg/dL — ABNORMAL HIGH (ref 0.44–1.00)
GFR calc Af Amer: 12 mL/min — ABNORMAL LOW (ref >60)
GFR calc non Af Amer: 10 mL/min — ABNORMAL LOW (ref >60)
Glucose, Bld: 165 mg/dL — ABNORMAL HIGH (ref 70–99)
Potassium: 2.8 mmol/L — ABNORMAL LOW (ref 3.5–5.1)
Sodium: 135 mmol/L (ref 135–145)
Total Bilirubin: 0.7 mg/dL (ref 0.3–1.2)
Total Protein: 6.2 g/dL — ABNORMAL LOW (ref 6.5–8.1)

## 2018-07-04 LAB — CBC
HCT: 37.4 % (ref 36.0–46.0)
Hemoglobin: 11.6 g/dL — ABNORMAL LOW (ref 12.0–15.0)
MCH: 31.1 pg (ref 26.0–34.0)
MCHC: 31 g/dL (ref 30.0–36.0)
MCV: 100.3 fL — ABNORMAL HIGH (ref 80.0–100.0)
Platelets: 277 10*3/uL (ref 150–400)
RBC: 3.73 MIL/uL — ABNORMAL LOW (ref 3.87–5.11)
RDW: 13.2 % (ref 11.5–15.5)
WBC: 9.5 10*3/uL (ref 4.0–10.5)
nRBC: 0 % (ref 0.0–0.2)

## 2018-07-04 LAB — MRSA PCR SCREENING: MRSA by PCR: NEGATIVE

## 2018-07-04 MED ORDER — POTASSIUM CHLORIDE 10 MEQ/100ML IV SOLN
10.0000 meq | Freq: Once | INTRAVENOUS | Status: AC
  Administered 2018-07-04: 10 meq via INTRAVENOUS
  Filled 2018-07-04: qty 100

## 2018-07-04 MED ORDER — LIDOCAINE 5 % EX PTCH
1.0000 | MEDICATED_PATCH | Freq: Every day | CUTANEOUS | Status: DC
  Administered 2018-07-06 – 2018-07-19 (×13): 1 via TRANSDERMAL
  Filled 2018-07-04 (×18): qty 1

## 2018-07-04 MED ORDER — ACETAMINOPHEN 650 MG RE SUPP: 650.0000 mg | Freq: Four times a day (QID) | RECTAL | Status: DC | PRN

## 2018-07-04 MED ORDER — IPRATROPIUM-ALBUTEROL 0.5-2.5 (3) MG/3ML IN SOLN: 3.0000 mL | Freq: Four times a day (QID) | RESPIRATORY_TRACT | Status: DC | PRN

## 2018-07-04 MED ORDER — POTASSIUM CHLORIDE 10 MEQ/100ML IV SOLN
10.0000 meq | INTRAVENOUS | Status: AC
  Administered 2018-07-04 (×4): 10 meq via INTRAVENOUS
  Filled 2018-07-04 (×4): qty 100

## 2018-07-04 MED ORDER — FENTANYL CITRATE (PF) 100 MCG/2ML IJ SOLN
25.0000 ug | Freq: Once | INTRAMUSCULAR | Status: AC
  Administered 2018-07-04: 25 ug via INTRAVENOUS
  Filled 2018-07-04: qty 2

## 2018-07-04 MED ORDER — FLUOXETINE HCL 20 MG PO CAPS
40.0000 mg | ORAL_CAPSULE | Freq: Every day | ORAL | Status: DC
  Administered 2018-07-05 – 2018-07-09 (×5): 40 mg via ORAL
  Filled 2018-07-04 (×5): qty 2

## 2018-07-04 MED ORDER — SODIUM CHLORIDE 0.9 % IV SOLN
INTRAVENOUS | Status: DC
  Administered 2018-07-04 – 2018-07-09 (×14): via INTRAVENOUS

## 2018-07-04 MED ORDER — ONDANSETRON HCL 4 MG/2ML IJ SOLN: 4.0000 mg | Freq: Four times a day (QID) | INTRAMUSCULAR | Status: DC | PRN

## 2018-07-04 MED ORDER — HEPARIN SODIUM (PORCINE) 5000 UNIT/ML IJ SOLN
5000.0000 [IU] | Freq: Three times a day (TID) | INTRAMUSCULAR | Status: DC
  Administered 2018-07-04 – 2018-07-05 (×2): 5000 [IU] via SUBCUTANEOUS
  Filled 2018-07-04 (×2): qty 1

## 2018-07-04 MED ORDER — IOPAMIDOL (ISOVUE-300) INJECTION 61%
30.0000 mL | Freq: Once | INTRAVENOUS | Status: AC | PRN
  Administered 2018-07-04: 30 mL via ORAL

## 2018-07-04 MED ORDER — POTASSIUM CHLORIDE 10 MEQ/100ML IV SOLN: 10.0000 meq | INTRAVENOUS | Status: DC

## 2018-07-04 MED ORDER — SODIUM CHLORIDE 0.9 % IV SOLN
250.0000 mL | INTRAVENOUS | Status: DC | PRN
  Administered 2018-07-12: 250 mL via INTRAVENOUS

## 2018-07-04 MED ORDER — PREDNISONE 10 MG PO TABS
5.0000 mg | ORAL_TABLET | Freq: Every day | ORAL | Status: DC
  Administered 2018-07-05 – 2018-07-19 (×15): 5 mg via ORAL
  Filled 2018-07-04 (×15): qty 1

## 2018-07-04 MED ORDER — SODIUM CHLORIDE 0.9% FLUSH
3.0000 mL | Freq: Two times a day (BID) | INTRAVENOUS | Status: DC
  Administered 2018-07-06 – 2018-07-19 (×18): 3 mL via INTRAVENOUS

## 2018-07-04 MED ORDER — MOMETASONE FURO-FORMOTEROL FUM 200-5 MCG/ACT IN AERO
2.0000 | INHALATION_SPRAY | Freq: Two times a day (BID) | RESPIRATORY_TRACT | Status: DC
  Administered 2018-07-05 – 2018-07-19 (×28): 2 via RESPIRATORY_TRACT
  Filled 2018-07-04 (×4): qty 8.8

## 2018-07-04 MED ORDER — ONDANSETRON HCL 4 MG/2ML IJ SOLN
4.0000 mg | Freq: Once | INTRAMUSCULAR | Status: AC
  Administered 2018-07-04: 4 mg via INTRAVENOUS
  Filled 2018-07-04: qty 2

## 2018-07-04 MED ORDER — ACETAMINOPHEN 325 MG PO TABS
650.0000 mg | ORAL_TABLET | Freq: Four times a day (QID) | ORAL | Status: DC | PRN
  Administered 2018-07-04 – 2018-07-12 (×7): 650 mg via ORAL
  Filled 2018-07-04 (×8): qty 2

## 2018-07-04 MED ORDER — SODIUM CHLORIDE 0.9% FLUSH: 3.0000 mL | INTRAVENOUS | Status: DC | PRN

## 2018-07-04 MED ORDER — DICLOFENAC SODIUM 1 % TD GEL
4.0000 g | Freq: Four times a day (QID) | TRANSDERMAL | Status: DC
  Administered 2018-07-04 – 2018-07-19 (×34): 4 g via TOPICAL
  Filled 2018-07-04 (×3): qty 100

## 2018-07-04 MED ORDER — MIRTAZAPINE 15 MG PO TABS
15.0000 mg | ORAL_TABLET | Freq: Every day | ORAL | Status: DC
  Administered 2018-07-04 – 2018-07-08 (×5): 15 mg via ORAL
  Filled 2018-07-04 (×6): qty 1

## 2018-07-04 MED ORDER — SODIUM CHLORIDE 0.9 % IV BOLUS
1000.0000 mL | Freq: Once | INTRAVENOUS | Status: AC
  Administered 2018-07-04: 1000 mL via INTRAVENOUS

## 2018-07-04 MED ORDER — ONDANSETRON HCL 4 MG PO TABS: 4.0000 mg | ORAL_TABLET | Freq: Four times a day (QID) | ORAL | Status: DC | PRN

## 2018-07-04 MED ORDER — SODIUM CHLORIDE 0.9 % IV BOLUS
500.0000 mL | Freq: Once | INTRAVENOUS | Status: AC
  Administered 2018-07-04: 500 mL via INTRAVENOUS

## 2018-07-04 NOTE — ED Notes (Signed)
Patient transported to X-ray 

## 2018-07-04 NOTE — ED Notes (Signed)
EDP at bedside  

## 2018-07-04 NOTE — ED Notes (Signed)
[facility] LPN updated on pt at this time.

## 2018-07-04 NOTE — ED Triage Notes (Signed)
Pt from [facility] SNF. Pt c/o abdominal distention and tenderness, especially in lower quadrants. Also endorses nausea. Pt reports no urination or bowel movement since Friday. Pt currently taking Flagyl for c.diff since 10/24.

## 2018-07-04 NOTE — ED Notes (Signed)
Pt has been speaking with family on the phone, appears in no distress at this time.

## 2018-07-04 NOTE — H&P (Signed)
History and Physical    Jordan [name] YWV:[id number] DOB: 05-20-1946 DOA: 07/04/2018  PCP: Sinda Du, MD   Patient coming from: [facility]  Chief Complaint: Distended abdomen with no bowel movement or urination for several days  HPI: Jordan [name] is a 72 y.o. female with medical history significant for lumbar compression fracture, neuromuscular dysfunction of bladder and colon, CKD stage III, hypertension, dyslipidemia, IBS, COPD, and questionable recent history of C. difficile colitis with initiation of Flagyl approximately 3 to 4 days ago, who presented to the ED with worsening abdominal distention and apparently no bowel movement or urination since Friday of last week.  According to SNF notes, she was started on Flagyl for C. difficile on 10/24 at the facility.  The patient is overall a poor historian, but appears to state that she did not have any significant diarrhea or abdominal pain at that time.  She is noted to have some ongoing nausea but no vomiting and has maintained a poor appetite with poor oral intake.  She states that she has been drinking plenty of water, but continues to remain very thirsty.  She denies any bloody bowel movements, fever, chills, chest pain, or shortness of breath.   ED Course: Vital signs are currently stable.  Laboratory data demonstrates stable hemoglobin of 11.6, however there is signs of AKI with BUN of 76 and creatinine of 4.05 with baseline creatinine of approximately 1.4-1.7 consistent with stage III CKD.  She is noted to have glucose of 165 and potassium of 2.8.  She has only been given 10 mEq of potassium IV as well as a 500 mL bolus of fluid in the ED.  Her magnesium is still pending.  Imaging studies demonstrate an adynamic colonic ileus on CT and abdominal film.  EKG with sinus rhythm at 77 bpm and right bundle branch block noted.  Review of Systems: Cannot be satisfactorily obtained given patient's condition.  Past Medical History:  Diagnosis  Date  . Allergy    seasonal  . Anemia   . Arthritis   . Asthma    uses Advair and Spiriva daily,Albuterol prn.Takes Singulair at bedtime and Prednisone daily  . Back spasm    takes Zanaflex daily as needed  . Blood transfusion 2007   no abnormal reation noted   . Bruises easily    d/t being on Prednisone  . Bursitis    left elbow  . C. difficile colitis    2007: treated with Flagyl and Vanc, 2014: treated with vanc, Aug 2014: Vanc taper  . Chronic back pain   . Chronic kidney disease    "creatine" creeping up - followed at this time by PCP  . Complication of anesthesia 2000   woke up during one surgery- ovary surgery  . COPD (chronic obstructive pulmonary disease) ([place])   . Depression    takes Prozac daily  . Dysphasia   . Falls   . H/O hiatal hernia   . Hemorrhoids   . History of bronchitis    last time a yr ago  . History of colon polyps   . History of kidney stones   . History of kidney stones   . Hyperlipidemia    hx of-was on meds but has been off x 1 1/2 yrs  . Hypertension    takes Verapamil nightly  . IBS (irritable bowel syndrome)   . Insomnia    takes Benadryl as needed  . Myocardial infarction [facility]) 2007   Takotsubo cardiomyopathy  .  Neurogenic bowel   . Neuromuscular dysfunction of bladder, unspecified   . Nocturia   . Osteoporosis    takes Fosamax weekly  . Pneumonia    MRSA pneumonia in 2007  . Shortness of breath    with exertion daily  . Stroke [facility])    "they say I has a small stroke".No deficits  . Tingling    and pain in left leg  . Urinary frequency     Past Surgical History:  Procedure Laterality Date  . APPENDECTOMY    . BIOPSY  04/13/2017   Procedure: BIOPSY;  Surgeon: Daneil Dolin, MD;  [facility];  Service: Endoscopy;;  right and left colon   . CARPAL TUNNEL RELEASE Left   . CARPAL TUNNEL RELEASE Right 09/21/2015   Procedure: CARPAL TUNNEL RELEASE;  Surgeon: Carole Civil, MD;  Location: [facility] OR[facility];  Service:  Orthopedics;  Laterality: Right;  . cataract surgery Bilateral   . CHOLECYSTECTOMY    . COLONOSCOPY   09/24/2006   RMR: Normal rectum, left-sided diverticula  . COLONOSCOPY  Oct 2012   Dr. Henrene Pastor: tubular adenomas, moderative diverticulosis, internal hemorrhoids  . COLONOSCOPY WITH PROPOFOL N/A 04/13/2017   Procedure: COLONOSCOPY WITH PROPOFOL;  Surgeon: Daneil Dolin, MD;  [facility];  Service: Endoscopy;  Laterality: N/A;  8:30am  . CYSTOSCOPY    . ENDARTERECTOMY Right 12/12/2015   Procedure: ENDARTERECTOMY RIGHT CAROTID, RESECTION OF REDUNDANT RIGHT COMMON CAROTID ARTERY WITH PRIMARY REANASTOMOSIS;  Surgeon: Mal Misty, MD;  Location: [place];  Service: Vascular;  Laterality: Right;  . ESOPHAGOGASTRODUODENOSCOPY  04/07/2006   HWE:XHBZJI esophagus s/p placement of Bravo pH probe, some surgical changes but not typical of fundoplication wrap, may have slipped  . GANGLION CYST EXCISION Right 09/21/2015   Procedure: RIGHT WRIST GANGLION CYST REMOVAL;  Surgeon: Carole Civil, MD;  Location: [facility];  Service: Orthopedics;  Laterality: Right;  . hemorrhoidal banding    . HERNIA REPAIR     umbilical  . KNEE ARTHROSCOPY Right   . KYPHOPLASTY N/A 02/23/2018   Procedure: KYPHOPLASTY LUMBAR FOUR;  Surgeon: Newman Pies, MD;  Location: [place];  Service: Neurosurgery;  Laterality: N/A;  . LUMBAR LAMINECTOMY/DECOMPRESSION MICRODISCECTOMY Left 10/17/2013   Procedure: LUMBAR LAMINECTOMY/DECOMPRESSION MICRODISCECTOMY 1 LEVEL three/four;  Surgeon: Ophelia Charter, MD;  Location: [place] [facility];  Service: Neurosurgery;  Laterality: Left;  . NECK SURGERY     fusion-   . NISSEN FUNDOPLICATION     x 2  . OLECRANON BURSECTOMY Left 01/11/2014   Procedure: OLECRANON BURSECTOMY;  Surgeon: Carole Civil, MD;  Location: [facility];  Service: Orthopedics;  Laterality: Left;  . OOPHORECTOMY Right   . PATCH ANGIOPLASTY Right 12/12/2015   Procedure: PATCH ANGIOPLASTY RIGHT CAROTID ARTERY USING  HEMASHIELD PLATINUM FINESSE PATCH;  Surgeon: Mal Misty, MD;  Location: [place];  Service: Vascular;  Laterality: Right;  . TONSILLECTOMY       reports that she has never smoked. She has never used smokeless tobacco. She reports that she drank about 10.0 standard drinks of alcohol per week. She reports that she does not use drugs.  Allergies  Allergen Reactions  . Cardura [Doxazosin Mesylate] Hives  . Barbiturates Nausea And Vomiting and Rash  . Levofloxacin Rash  . Sulfa Antibiotics Nausea Only and Rash    Family History  Problem Relation Age of Onset  . Heart disease Father   . Colon cancer Neg Hx     Prior to Admission  medications   Medication Sig Start Date End Date Taking? Authorizing Provider  AMINO ACIDS-PROTEIN HYDROLYS PO Take 30 mLs by mouth 2 (two) times daily.   Yes [provider]  budesonide-formoterol (SYMBICORT) 160-4.5 MCG/ACT inhaler Inhale 2 puffs into the lungs 2 (two) times daily.   Yes [provider]  dicyclomine (BENTYL) 20 MG tablet Take 20 mg by mouth every 6 (six) hours.   Yes [provider]  ferrous sulfate 325 (65 FE) MG tablet Take 325 mg by mouth 2 (two) times daily with a meal.   Yes [provider]  FLUoxetine (PROZAC) 40 MG capsule Take 40 mg by mouth daily.     Yes [provider]  folic acid (FOLVITE) 1 MG tablet Take 1 mg by mouth daily.   Yes [provider]  gabapentin (NEURONTIN) 400 MG capsule Take 400 mg by mouth 2 (two) times daily.   Yes [provider]  guaifenesin (HUMIBID E) 400 MG TABS tablet Take 400 mg by mouth 3 (three) times daily. For 10 days   Yes [provider]  HYDROcodone-acetaminophen (NORCO/VICODIN) 5-325 MG tablet Take 2 tablets by mouth every 6 (six) hours as needed for moderate pain. 02/24/18  Yes Eustace Moore, MD  lisinopril (PRINIVIL,ZESTRIL) 20 MG tablet Take 20 mg by mouth daily.   Yes [provider]  metroNIDAZOLE (FLAGYL) 500 MG  tablet Take 500 mg by mouth 3 (three) times daily.   Yes [provider]  mirtazapine (REMERON) 15 MG tablet Take 15 mg by mouth at bedtime.   Yes [provider]  montelukast (SINGULAIR) 10 MG tablet Take 10 mg by mouth daily.    Yes [provider]  OXYGEN Inhale 2 L into the lungs as needed (TO MAINTAIN O2 LEVELS).   Yes [provider]  pantoprazole (PROTONIX) 40 MG tablet Take 40 mg by mouth daily.   Yes [provider]  polycarbophil (FIBERCON) 625 MG tablet Take by mouth.   Yes [provider]  predniSONE (DELTASONE) 10 MG tablet Take 5 mg by mouth daily with breakfast.    Yes [provider]  Probiotic Product (ALIGN) 4 MG CAPS Take 1 capsule by mouth daily.   Yes [provider]  thiamine 100 MG tablet Take 100 mg by mouth daily.   Yes [provider]  vitamin C (ASCORBIC ACID) 500 MG tablet Take 500 mg by mouth 2 (two) times daily.   Yes [provider]  acetaminophen (TYLENOL) 325 MG tablet Take 650 mg by mouth 4 (four) times daily.    [provider]  amoxicillin-clavulanate (AUGMENTIN) 875-125 MG tablet Take 1 tablet by mouth 2 (two) times daily. For 10 days 06/28/18   [provider]  diclofenac sodium (VOLTAREN) 1 % GEL Apply 4 g topically 4 (four) times daily.    [provider]  docusate sodium (COLACE) 100 MG capsule Take 1 capsule (100 mg total) by mouth every 12 (twelve) hours. While taking oxycodone 01/21/18   Mesner, Corene Cornea, MD  ipratropium-albuterol (DUONEB) 0.5-2.5 (3) MG/3ML SOLN Take 3 mLs by nebulization every 6 (six) hours as needed (SOB).    [provider]  Lidocaine 4 % PTCH Apply 1 patch topically daily. Left hip    [provider]  Melatonin 3 MG TABS Take 6 mg by mouth at bedtime.    [provider]  ondansetron (ZOFRAN-ODT) 4 MG disintegrating tablet Take 4 mg by mouth every 4 (four) hours as needed for nausea  or vomiting.     [provider]  polycarbophil (FIBERCON) 625 MG tablet Take 625 mg by mouth daily.    [provider]  tiZANidine (ZANAFLEX) 2 MG tablet Take 2 mg by mouth every 8 (eight) hours as needed for muscle spasms.    [provider]    Physical Exam: Vitals:   07/04/18 1330 07/04/18 1400 07/04/18 1430 07/04/18 1500  BP: 135/65 128/68 130/65 101/66  Pulse: 78  78 77  Resp: 20 (!) 26 19 (!) 21  Temp:      TempSrc:      SpO2: 95% 97% 96% 97%  Weight:      Height:        Constitutional: NAD, calm, comfortable Vitals:   07/04/18 1330 07/04/18 1400 07/04/18 1430 07/04/18 1500  BP: 135/65 128/68 130/65 101/66  Pulse: 78  78 77  Resp: 20 (!) 26 19 (!) 21  Temp:      TempSrc:      SpO2: 95% 97% 96% 97%  Weight:      Height:       Eyes: lids and conjunctivae normal ENMT: Mucous membranes are dry with poor dentition. Neck: normal, supple Respiratory: clear to auscultation bilaterally. Normal respiratory effort. No accessory muscle use.  Currently on room air. Cardiovascular: Regular rate and rhythm, no murmurs. No extremity edema. Abdomen: Severe distention present throughout with tenderness to gentle palpation in all 4 quadrants.  No bowel sounds currently appreciated. Musculoskeletal:  No joint deformity upper and lower extremities.   Skin: no rashes, lesions, ulcers.  Psychiatric: Cannot be adequately assessed.  Labs on Admission: I have personally reviewed following labs and imaging studies  CBC: Recent Labs  Lab 07/04/18 0729  WBC 9.5  HGB 11.6*  HCT 37.4  MCV 100.3*  PLT 937   Basic Metabolic Panel: Recent Labs  Lab 07/04/18 0729  NA 135  K 2.8*  CL 105  CO2 17*  GLUCOSE 165*  BUN 76*  CREATININE 4.05*  CALCIUM 7.8*  MG 3.3*   GFR: Estimated Creatinine Clearance: 9.9 mL/min (A) (by C-G formula based on SCr of 4.05 mg/dL (H)). Liver Function Tests: Recent Labs  Lab 07/04/18 0729  AST 12*  ALT 15  ALKPHOS 69  BILITOT 0.7  PROT  6.2*  ALBUMIN 2.5*   Recent Labs  Lab 07/04/18 0729  LIPASE 24   No results for input(s): AMMONIA in the last 168 hours. Coagulation Profile: No results for input(s): INR, PROTIME in the last 168 hours. Cardiac Enzymes: No results for input(s): CKTOTAL, CKMB, CKMBINDEX, TROPONINI in the last 168 hours. BNP (last 3 results) No results for input(s): PROBNP in the last 8760 hours. HbA1C: No results for input(s): HGBA1C in the last 72 hours. CBG: No results for input(s): GLUCAP in the last 168 hours. Lipid Profile: No results for input(s): CHOL, HDL, LDLCALC, TRIG, CHOLHDL, LDLDIRECT in the last 72 hours. Thyroid Function Tests: No results for input(s): TSH, T4TOTAL, FREET4, T3FREE, THYROIDAB in the last 72 hours. Anemia Panel: No results for input(s): VITAMINB12, FOLATE, FERRITIN, TIBC, IRON, RETICCTPCT in the last 72 hours. Urine analysis:    Component Value Date/Time   COLORURINE AMBER (A) 07/04/2018 1014   APPEARANCEUR CLOUDY (A) 07/04/2018 1014   LABSPEC 1.016 07/04/2018 1014   PHURINE 5.0 07/04/2018 1014   GLUCOSEU NEGATIVE 07/04/2018 1014   HGBUR NEGATIVE 07/04/2018 1014   BILIRUBINUR NEGATIVE 07/04/2018 1014   KETONESUR 5 (A) 07/04/2018 [place] 07/04/2018 1014  UROBILINOGEN 0.2 05/20/2007 1717   NITRITE NEGATIVE 07/04/2018 1014   LEUKOCYTESUR NEGATIVE 07/04/2018 1014    Radiological Exams on Admission: Ct Abdomen Pelvis Wo Contrast  Result Date: 07/04/2018 CLINICAL DATA:  Abdominal distension, nausea, on Flagyl for C diff EXAM: CT ABDOMEN AND PELVIS WITHOUT CONTRAST TECHNIQUE: Multidetector CT imaging of the abdomen and pelvis was performed following the standard protocol without IV contrast. COMPARISON:  05/05/2017 FINDINGS: Lower chest: Small bilateral pleural effusions. Hepatobiliary: Unenhanced liver is unremarkable. Status post cholecystectomy. No intrahepatic or extrahepatic ductal dilatation. Pancreas: Within normal limits. Spleen: Within  normal limits. Adrenals/Urinary Tract: Adrenal glands within normal limits. Right renal cortical atrophy/scarring. 8 mm nonobstructing right lower pole renal calculus (series 2/image 29). No hydronephrosis. Bladder is mildly thick-walled although underdistended. Stomach/Bowel: Stomach is notable for a small hiatal hernia. No evidence of small bowel obstruction. Diffuse colonic dilatation, measuring up to 9.4 cm in the cecum, which is mobile and located in the left mid abdomen (series 2/image 44). However, there is no significant rectum may be mildly thick-walled (series 2/image 77), but this is likely exacerbated by underdistention. No pneumatosis or free air. Vascular/Lymphatic: No evidence of abdominal aortic aneurysm. Atherosclerotic calcifications of the abdominal aorta and branch vessels. No suspicious abdominopelvic lymphadenopathy. Reproductive: Uterus is unremarkable. No adnexal masses. Other: Small volume perihepatic ascites. Trace pelvic ascites. Musculoskeletal: Prior vertebral augmentation at L4. Moderate compression fracture deformity at T12. Mild compression fracture deformity at L1. These findings are unchanged. Mild degenerative changes of the lumbar spine with dextroscoliosis. IMPRESSION: Diffuse colonic dilatation, without convincing wall thickening or pericolonic inflammatory changes despite the known history of C difficile colitis. As such, the overall appearance favors adynamic/paralytic colonic ileus. No pneumatosis or free air.  No evidence of small bowel obstruction. Small volume abdominopelvic ascites. Small bilateral pleural effusions. Electronically Signed   By: Julian Hy M.D.   On: 07/04/2018 14:23   Dg Abd Acute W/chest  Result Date: 07/04/2018 CLINICAL DATA:  Abdominal pain, distension EXAM: DG ABDOMEN ACUTE W/ 1V CHEST COMPARISON:  Chest radiograph dated 04/27/2017 FINDINGS: Lungs are clear.  No pleural effusion or pneumothorax. The heart is normal in size. Colonic  distension, suggesting adynamic colonic ileus. No findings to suggest small bowel obstruction. No evidence of free air under the diaphragm on the upright view. Mild degenerative changes of the lumbar spine with lumbar dextroscoliosis. Vertebral augmentation at L4. Cholecystectomy clips. IMPRESSION: No evidence of acute cardiopulmonary disease. Suspected adynamic colonic ileus. No findings to suggest small bowel obstruction or free air. Electronically Signed   By: Julian Hy M.D.   On: 07/04/2018 09:20    EKG: Independently reviewed.  Sinus rhythm at 77 bpm with right bundle branch block.  Assessment/Plan Principal Problem:   Ileus (HCC) Active Problems:   Essential hypertension   COPD (chronic obstructive pulmonary disease) (HCC)   Lumbar compression fracture (HCC)   AKI (acute kidney injury) (HCC)   Hypokalemia    1. Adynamic colonic ileus in the setting of questionable recent C. difficile colitis.  Fever patient to be up in chair and will place suppository to help with management.  IV fluid hydration.  Check magnesium levels and replete as needed and treat hypokalemia aggressively.  Keep n.p.o. except for ice chips for now until situation further improves and patient has no further nausea at which point diet can be advanced.  GI consulted for further evaluation and appreciate assistance.  Avoid further Flagyl at this time.  Avoid narcotic pain medications currently. 2. AKI on  CKD stage III with oliguria.  Maintain on IV fluid hydration and avoid nephrotoxic agents.  Monitor strict I's and O's with Foley catheter placement due to patient condition and previously noted bladder neurological dysfunction.  Consult to nephrology due to presence of oliguria and severe electrolyte dysfunction. 3. Severe hypokalemia.  EKG with no significant changes, continue to monitor on telemetry.  She has only received 10 mEq IV in the ED thus far and will add another 40 mEq IV supplementation for now.   Continue to monitor repeat levels in a.m. and monitor magnesium. 4. Essential hypertension.  In the face of soft blood pressure readings currently we will hold lisinopril.  Continue on gentle IV fluid and monitor carefully. 5. History of COPD.  Maintain on Dulera while inpatient and continue Singulair as well as chronic prednisone 5 mg daily.   DVT prophylaxis: Heparin Code Status: Full Family Communication: None at bedside Disposition Plan: GI and nephrology evaluation for ileus and AKI respectively Consults called: GI and nephrology Admission status: Inpatient, telemetry   Pratik Darleen Crocker DO [facility] Pager [phone]  If 7PM-7AM, please contact night-coverage [email or web address] Password [email or web address]  07/04/2018, 3:30 PM

## 2018-07-04 NOTE — ED Notes (Signed)
Admitting provider at bedside.

## 2018-07-04 NOTE — ED Provider Notes (Signed)
[facility] EMERGENCY DEPARTMENT Provider Note   CSN: [id number] Arrival date & time: 07/04/18  6283     History   Chief Complaint Chief Complaint  Patient presents with  . Abdominal Pain    HPI Jordan [name] is a 72 y.o. female.  Patient sent in from [place].  Patient with complaint of abdominal distention and some tenderness.  Nausea but no vomiting.  Patient states she has not had a bowel movement or any urination since Friday.  Nursing facility seems to have notes to say the Flagyl was started on 1024 for C. difficile.  Not sure about the circumstances surrounding that.  Patient states she is very thirsty keeps wanting to drink something.  Clinically she appears very dehydrated.  Patient's primary care doctor is Dr. Luan Pulling.     Past Medical History:  Diagnosis Date  . Allergy    seasonal  . Anemia   . Arthritis   . Asthma    uses Advair and Spiriva daily,Albuterol prn.Takes Singulair at bedtime and Prednisone daily  . Back spasm    takes Zanaflex daily as needed  . Blood transfusion 2007   no abnormal reation noted   . Bruises easily    d/t being on Prednisone  . Bursitis    left elbow  . C. difficile colitis    2007: treated with Flagyl and Vanc, 2014: treated with vanc, Aug 2014: Vanc taper  . Chronic back pain   . Chronic kidney disease    "creatine" creeping up - followed at this time by PCP  . Complication of anesthesia 2000   woke up during one surgery- ovary surgery  . COPD (chronic obstructive pulmonary disease) ([place])   . Depression    takes Prozac daily  . Dysphasia   . Falls   . H/O hiatal hernia   . Hemorrhoids   . History of bronchitis    last time a yr ago  . History of colon polyps   . History of kidney stones   . History of kidney stones   . Hyperlipidemia    hx of-was on meds but has been off x 1 1/2 yrs  . Hypertension    takes Verapamil nightly  . IBS (irritable bowel syndrome)   . Insomnia    takes Benadryl as  needed  . 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 Neurogenic bowel   . Neuromuscular dysfunction of bladder, unspecified   . Nocturia   . Osteoporosis    takes Fosamax weekly  . Pneumonia    MRSA pneumonia in 2007  . Shortness of breath    with exertion daily  . Stroke [facility])    "they say I has a small stroke".No deficits  . Tingling    and pain in left leg  . Urinary frequency     Patient Active Problem List   Diagnosis Date Noted  . Ileus ([place]) 07/04/2018  . Lumbar compression fracture ([place]) 02/23/2018  . Anemia 02/20/2017  . History of colon polyps 02/20/2017  . Chronic diarrhea 02/20/2017  . Abnormal weight loss 02/20/2017  . Carotid stenosis, symptomatic w/o infarct 01/01/2016  . Carotid stenosis 11/27/2015  . Ganglion cyst of wrist   . Hematoma 01/23/2014  . Lumbar foraminal stenosis 10/17/2013  . Olecranon bursitis of left elbow 09/14/2013  . Left elbow pain 09/14/2013  . C. difficile diarrhea 06/20/2013  . Loose stools 03/23/2013  . 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 PALPITATIONS 05/04/2009  .  CHEST PAIN UNSPECIFIED 05/04/2009  . HYPERTENSION 04/24/2009  . ASTHMA 04/24/2009  . COPD 04/24/2009    Past Surgical History:  Procedure Laterality Date  . APPENDECTOMY    . BIOPSY  04/13/2017   Procedure: BIOPSY;  Surgeon: Daneil Dolin, MD;  Location: [facility] ENDO SU[facility];  Service: Endoscopy;;  right and left colon   . CARPAL TUNNEL RELEASE Left   . CARPAL TUNNEL RELEASE Right 09/21/2015   Procedure: CARPAL TUNNEL RELEASE;  Surgeon: Carole Civil, MD;  Location: [facility] ORS;  Service: Orthopedics;  Laterality: Right;  . cataract surgery Bilateral   . CHOLECYSTECTOMY    . COLONOSCOPY   09/24/2006   RMR: Normal rectum, left-sided diverticula  . COLONOSCOPY  Oct 2012   Dr. Henrene Pastor: tubular adenomas, moderative diverticulosis, internal hemorrhoids  . COLONOSCOPY WITH PROPOFOL N/A 04/13/2017   Procedure: COLONOSCOPY WITH PROPOFOL;  Surgeon: Daneil Dolin, MD;  Location: [facility] END[facility];  Service: Endoscopy;  Laterality: N/A;  8:30am  . CYSTOSCOPY    . ENDARTERECTOMY Right 12/12/2015   Procedure: ENDARTERECTOMY RIGHT CAROTID, RESECTION OF REDUNDANT RIGHT COMMON CAROTID ARTERY WITH PRIMARY REANASTOMOSIS;  Surgeon: Mal Misty, MD;  Location: [place];  Service: Vascular;  Laterality: Right;  . ESOPHAGOGASTRODUODENOSCOPY  04/07/2006   KDX:IPJASN esophagus s/p placement of Bravo pH probe, some surgical changes but not typical of fundoplication wrap, may have slipped  . GANGLION CYST EXCISION Right 09/21/2015   Procedure: RIGHT WRIST GANGLION CYST REMOVAL;  Surgeon: Carole Civil, MD;  Location: [facility] ORS;  Service: Orthopedics;  Laterality: Right;  . hemorrhoidal banding    . HERNIA REPAIR     umbilical  . KNEE ARTHROSCOPY Right   . KYPHOPLASTY N/A 02/23/2018   Procedure: KYPHOPLASTY LUMBAR FOUR;  Surgeon: Newman Pies, MD;  Location: [place];  Service: Neurosurgery;  Laterality: N/A;  . LUMBAR LAMINECTOMY/DECOMPRESSION MICRODISCECTOMY Left 10/17/2013   Procedure: LUMBAR LAMINECTOMY/DECOMPRESSION MICRODISCECTOMY 1 LEVEL three/four;  Surgeon: Ophelia Charter, MD;  Location: [place] NEURO ORS;  Service: Neurosurgery;  Laterality: Left;  . NECK SURGERY     fusion-   . NISSEN FUNDOPLICATION     x 2  . OLECRANON BURSECTOMY Left 01/11/2014   Procedure: OLECRANON BURSECTOMY;  Surgeon: Carole Civil, MD;  Location: [facility] ORS;  Service: Orthopedics;  Laterality: Left;  . OOPHORECTOMY Right   . PATCH ANGIOPLASTY Right 12/12/2015   Procedure: PATCH ANGIOPLASTY RIGHT CAROTID ARTERY USING HEMASHIELD PLATINUM FINESSE PATCH;  Surgeon: Mal Misty, MD;  Location: [place];  Service: Vascular;  Laterality: Right;  . TONSILLECTOMY       OB History   None      Home Medications    Prior to Admission medications   Medication Sig Start Date End Date Taking? Authorizing Provider  AMINO ACIDS-PROTEIN HYDROLYS PO Take 30 mLs by mouth 2 (two) times daily.    Yes [provider]  budesonide-formoterol (SYMBICORT) 160-4.5 MCG/ACT inhaler Inhale 2 puffs into the lungs 2 (two) times daily.   Yes [provider]  dicyclomine (BENTYL) 20 MG tablet Take 20 mg by mouth every 6 (six) hours.   Yes [provider]  ferrous sulfate 325 (65 FE) MG tablet Take 325 mg by mouth 2 (two) times daily with a meal.   Yes [provider]  FLUoxetine (PROZAC) 40 MG capsule Take 40 mg by mouth daily.     Yes [provider]  folic acid (FOLVITE) 1 MG tablet Take 1 mg by mouth daily.  Yes [provider]  gabapentin (NEURONTIN) 400 MG capsule Take 400 mg by mouth 2 (two) times daily.   Yes [provider]  guaifenesin (HUMIBID E) 400 MG TABS tablet Take 400 mg by mouth 3 (three) times daily. For 10 days   Yes [provider]  HYDROcodone-acetaminophen (NORCO/VICODIN) 5-325 MG tablet Take 2 tablets by mouth every 6 (six) hours as needed for moderate pain. 02/24/18  Yes Eustace Moore, MD  lisinopril (PRINIVIL,ZESTRIL) 20 MG tablet Take 20 mg by mouth daily.   Yes [provider]  metroNIDAZOLE (FLAGYL) 500 MG tablet Take 500 mg by mouth 3 (three) times daily.   Yes [provider]  mirtazapine (REMERON) 15 MG tablet Take 15 mg by mouth at bedtime.   Yes [provider]  montelukast (SINGULAIR) 10 MG tablet Take 10 mg by mouth daily.    Yes [provider]  OXYGEN Inhale 2 L into the lungs as needed (TO MAINTAIN O2 LEVELS).   Yes [provider]  pantoprazole (PROTONIX) 40 MG tablet Take 40 mg by mouth daily.   Yes [provider]  polycarbophil (FIBERCON) 625 MG tablet Take by mouth.   Yes [provider]  predniSONE (DELTASONE) 10 MG tablet Take 5 mg by mouth daily with breakfast.    Yes [provider]  Probiotic Product (ALIGN) 4 MG CAPS Take 1 capsule by mouth daily.   Yes [provider]  thiamine 100 MG tablet Take 100 mg  by mouth daily.   Yes [provider]  vitamin C (ASCORBIC ACID) 500 MG tablet Take 500 mg by mouth 2 (two) times daily.   Yes [provider]  acetaminophen (TYLENOL) 325 MG tablet Take 650 mg by mouth 4 (four) times daily.    [provider]  amoxicillin-clavulanate (AUGMENTIN) 875-125 MG tablet Take 1 tablet by mouth 2 (two) times daily. For 10 days 06/28/18   [provider]  diclofenac sodium (VOLTAREN) 1 % GEL Apply 4 g topically 4 (four) times daily.    [provider]  docusate sodium (COLACE) 100 MG capsule Take 1 capsule (100 mg total) by mouth every 12 (twelve) hours. While taking oxycodone 01/21/18   Mesner, Corene Cornea, MD  ipratropium-albuterol (DUONEB) 0.5-2.5 (3) MG/3ML SOLN Take 3 mLs by nebulization every 6 (six) hours as needed (SOB).    [provider]  Lidocaine 4 % PTCH Apply 1 patch topically daily. Left hip    [provider]  Melatonin 3 MG TABS Take 6 mg by mouth at bedtime.    [provider]  ondansetron (ZOFRAN-ODT) 4 MG disintegrating tablet Take 4 mg by mouth every 4 (four) hours as needed for nausea or vomiting.    [provider]  polycarbophil (FIBERCON) 625 MG tablet Take 625 mg by mouth daily.    [provider]  tiZANidine (ZANAFLEX) 2 MG tablet Take 2 mg by mouth every 8 (eight) hours as needed for muscle spasms.    [provider]    Family History Family History  Problem Relation Age of Onset  . Heart disease Father   . Colon cancer Neg Hx     Social History Social History   Tobacco Use  . Smoking status: Never Smoker  . Smokeless tobacco: Never Used  Substance Use Topics  . Alcohol use: Not Currently    Alcohol/week: 10.0 standard drinks    Types: 10 Cans of beer per week    Comment: couple of beers daily  .  Drug use: No     Allergies   Cardura [doxazosin mesylate]; Barbiturates; Levofloxacin; and Sulfa antibiotics   Review of Systems Review of  Systems  Constitutional: Negative for fever.  HENT: Negative for congestion.   Eyes: Negative for redness.  Respiratory: Negative for shortness of breath.   Cardiovascular: Negative for chest pain.  Gastrointestinal: Positive for abdominal distention, abdominal pain and nausea. Negative for diarrhea and vomiting.  Genitourinary: Positive for difficulty urinating.  Musculoskeletal: Negative for back pain.  Neurological: Negative for syncope.  Hematological: Does not bruise/bleed easily.  Psychiatric/Behavioral: Negative for confusion.     Physical Exam Updated Vital Signs BP 128/68   Pulse 78   Temp 97.8 F (36.6 C) (Oral)   Resp (!) 26   Ht 1.575 m (5\' 2" )   Wt 49.9 kg   SpO2 97%   BMI 20.12 kg/m   Physical Exam  Constitutional: She appears well-developed and well-nourished. No distress.  HENT:  Head: Normocephalic and atraumatic.  His membranes very dry.  Eyes: Pupils are equal, round, and reactive to light. EOM are normal.  Neck: Neck supple.  Cardiovascular: Normal rate and regular rhythm.  Pulmonary/Chest: Effort normal and breath sounds normal. No respiratory distress.  Abdominal: Soft. She exhibits distension.  No active bowel sounds.  Musculoskeletal: Normal range of motion. She exhibits no edema.  Neurological: She is alert. No cranial nerve deficit or sensory deficit. She exhibits normal muscle tone. Coordination normal.  Skin: Skin is warm.  Nursing note and vitals reviewed.    ED Treatments / Results  Labs (all labs ordered are listed, but only abnormal results are displayed) Labs Reviewed  COMPREHENSIVE METABOLIC PANEL - Abnormal; Notable for the following components:      Result Value   Potassium 2.8 (*)    CO2 17 (*)    Glucose, Bld 165 (*)    BUN 76 (*)    Creatinine, Ser 4.05 (*)    Calcium 7.8 (*)    Total Protein 6.2 (*)    Albumin 2.5 (*)    AST 12 (*)    GFR calc non Af Amer 10 (*)    GFR calc Af Amer 12 (*)    All other components  within normal limits  CBC - Abnormal; Notable for the following components:   RBC 3.73 (*)    Hemoglobin 11.6 (*)    MCV 100.3 (*)    All other components within normal limits  URINALYSIS, ROUTINE W REFLEX MICROSCOPIC - Abnormal; Notable for the following components:   Color, Urine AMBER (*)    APPearance CLOUDY (*)    Ketones, ur 5 (*)    All other components within normal limits  LIPASE, BLOOD  MAGNESIUM    EKG EKG Interpretation  Date/Time:  Sunday July 04 2018 08:46:20 EDT Ventricular Rate:  77 PR Interval:    QRS Duration: 134 QT Interval:  449 QTC Calculation: 509 R Axis:   113 Text Interpretation:  Sinus rhythm Right bundle branch block No significant change since last tracing Confirmed by Fredia Sorrow [phone]) on 07/04/2018 8:57:00 AM   Radiology Ct Abdomen Pelvis Wo Contrast  Result Date: 07/04/2018 CLINICAL DATA:  Abdominal distension, nausea, on Flagyl for C diff EXAM: CT ABDOMEN AND PELVIS WITHOUT CONTRAST TECHNIQUE: Multidetector CT imaging of the abdomen and pelvis was performed following the standard protocol without IV contrast. COMPARISON:  05/05/2017 FINDINGS: Lower chest: Small bilateral pleural effusions. Hepatobiliary: Unenhanced liver is unremarkable. Status post cholecystectomy. No intrahepatic or extrahepatic ductal  dilatation. Pancreas: Within normal limits. Spleen: Within normal limits. Adrenals/Urinary Tract: Adrenal glands within normal limits. Right renal cortical atrophy/scarring. 8 mm nonobstructing right lower pole renal calculus (series 2/image 29). No hydronephrosis. Bladder is mildly thick-walled although underdistended. Stomach/Bowel: Stomach is notable for a small hiatal hernia. No evidence of small bowel obstruction. Diffuse colonic dilatation, measuring up to 9.4 cm in the cecum, which is mobile and located in the left mid abdomen (series 2/image 44). However, there is no significant rectum may be mildly thick-walled (series 2/image 77),  but this is likely exacerbated by underdistention. No pneumatosis or free air. Vascular/Lymphatic: No evidence of abdominal aortic aneurysm. Atherosclerotic calcifications of the abdominal aorta and branch vessels. No suspicious abdominopelvic lymphadenopathy. Reproductive: Uterus is unremarkable. No adnexal masses. Other: Small volume perihepatic ascites. Trace pelvic ascites. Musculoskeletal: Prior vertebral augmentation at L4. Moderate compression fracture deformity at T12. Mild compression fracture deformity at L1. These findings are unchanged. Mild degenerative changes of the lumbar spine with dextroscoliosis. IMPRESSION: Diffuse colonic dilatation, without convincing wall thickening or pericolonic inflammatory changes despite the known history of C difficile colitis. As such, the overall appearance favors adynamic/paralytic colonic ileus. No pneumatosis or free air.  No evidence of small bowel obstruction. Small volume abdominopelvic ascites. Small bilateral pleural effusions. Electronically Signed   By: Julian Hy M.D.   On: 07/04/2018 14:23   Dg Abd Acute W/chest  Result Date: 07/04/2018 CLINICAL DATA:  Abdominal pain, distension EXAM: DG ABDOMEN ACUTE W/ 1V CHEST COMPARISON:  Chest radiograph dated 04/27/2017 FINDINGS: Lungs are clear.  No pleural effusion or pneumothorax. The heart is normal in size. Colonic distension, suggesting adynamic colonic ileus. No findings to suggest small bowel obstruction. No evidence of free air under the diaphragm on the upright view. Mild degenerative changes of the lumbar spine with lumbar dextroscoliosis. Vertebral augmentation at L4. Cholecystectomy clips. IMPRESSION: No evidence of acute cardiopulmonary disease. Suspected adynamic colonic ileus. No findings to suggest small bowel obstruction or free air. Electronically Signed   By: Julian Hy M.D.   On: 07/04/2018 09:20    Procedures Procedures (including critical care time)  CRITICAL  CARE Performed by: Fredia Sorrow Total critical care time: 30 minutes Critical care time was exclusive of separately billable procedures and treating other patients. Critical care was necessary to treat or prevent imminent or life-threatening deterioration. Critical care was time spent personally by me on the following activities: development of treatment plan with patient and/or surrogate as well as nursing, discussions with consultants, evaluation of patient's response to treatment, examination of patient, obtaining history from patient or surrogate, ordering and performing treatments and interventions, ordering and review of laboratory studies, ordering and review of radiographic studies, pulse oximetry and re-evaluation of patient's condition.   Medications Ordered in ED Medications  0.9 %  sodium chloride infusion ( Intravenous Stopped 07/04/18 0910)  sodium chloride 0.9 % bolus 500 mL (0 mLs Intravenous Stopped 07/04/18 0935)  ondansetron (ZOFRAN) injection 4 mg (4 mg Intravenous Given 07/04/18 0908)  potassium chloride 10 mEq in 100 mL IVPB ( Intravenous Stopped 07/04/18 1118)  fentaNYL (SUBLIMAZE) injection 25 mcg (25 mcg Intravenous Given 07/04/18 1242)  iopamidol (ISOVUE-300) 61 % injection 30 mL (30 mLs Oral Contrast Given 07/04/18 1324)     Initial Impression / Assessment and Plan / ED Course  I have reviewed the triage vital signs and the nursing notes.  Pertinent labs & imaging results that were available during my care of the patient were reviewed by me and  considered in my medical decision making (see chart for details).    Patient from nursing facility.  Apparently being treated for C. difficile.  Both had no evidence of any diarrhea here.  Patient reports no bowel movement or urination since Friday.  Supposedly started treatment with Flagyl for C. difficile on 1024.  Not clear exactly on this specifics of this.  Patient currently at [facility] [place].  Clearly has  abdominal distention significant abdominal distention.  Plain films consistent with an ileus CT was done without contrast but with oral contrast because of her acute kidney injury presentation which is probably prerenal.  CT scan consistent with a adynamic ileus.  Not sure there is any connection with the C. difficile diagnosis.  If she even has that.  Patient's electrolytes significant for potassium of 2.8.  As well as some metabolic acidosis and a marked change in her BUN and creatinine.  Creatinine here today is 4.05.  And her BUN is 76.  Again I think this is prerenal.  Patient clinically appears very dehydrated.  Gentle hydration started here shortly continue on that.  Receive 10 mEq of potassium IV she will probably need more of that.  Magnesium ordered for the hospitalist.  Patient alert.  Patient will require admission to the hospital.  Telemetry admission is probably appropriate.   Final Clinical Impressions(s) / ED Diagnoses   Final diagnoses:  Ileus of unspecified type ([place])  AKI (acute kidney injury) ([place])  Hypokalemia    ED Discharge Orders    None       Fredia Sorrow, MD 07/04/18 1502

## 2018-07-04 NOTE — ED Notes (Signed)
Pt bladder scanned.   411 ml.

## 2018-07-04 NOTE — ED Notes (Signed)
Sherwood Gambler, pt's brother. Call with dispo [phone]

## 2018-07-05 ENCOUNTER — Inpatient Hospital Stay (HOSPITAL_COMMUNITY): Payer: Medicare Other

## 2018-07-05 ENCOUNTER — Encounter (HOSPITAL_COMMUNITY): Payer: Self-pay | Admitting: Gastroenterology

## 2018-07-05 LAB — CBC
HCT: 34.5 % — ABNORMAL LOW (ref 36.0–46.0)
Hemoglobin: 10.3 g/dL — ABNORMAL LOW (ref 12.0–15.0)
MCH: 30.6 pg (ref 26.0–34.0)
MCHC: 29.9 g/dL — ABNORMAL LOW (ref 30.0–36.0)
MCV: 102.4 fL — ABNORMAL HIGH (ref 80.0–100.0)
Platelets: 237 10*3/uL (ref 150–400)
RBC: 3.37 MIL/uL — ABNORMAL LOW (ref 3.87–5.11)
RDW: 13.5 % (ref 11.5–15.5)
WBC: 6.5 10*3/uL (ref 4.0–10.5)
nRBC: 0 % (ref 0.0–0.2)

## 2018-07-05 LAB — BASIC METABOLIC PANEL
Anion gap: 11 (ref 5–15)
BUN: 77 mg/dL — ABNORMAL HIGH (ref 8–23)
CO2: 14 mmol/L — ABNORMAL LOW (ref 22–32)
Calcium: 7.3 mg/dL — ABNORMAL LOW (ref 8.9–10.3)
Chloride: 111 mmol/L (ref 98–111)
Creatinine, Ser: 4.24 mg/dL — ABNORMAL HIGH (ref 0.44–1.00)
GFR calc Af Amer: 11 mL/min — ABNORMAL LOW (ref >60)
GFR calc non Af Amer: 10 mL/min — ABNORMAL LOW (ref >60)
Glucose, Bld: 106 mg/dL — ABNORMAL HIGH (ref 70–99)
Potassium: 3.5 mmol/L (ref 3.5–5.1)
Sodium: 136 mmol/L (ref 135–145)

## 2018-07-05 LAB — TSH: TSH: 1.181 u[IU]/mL (ref 0.350–4.500)

## 2018-07-05 MED ORDER — TRAMADOL HCL 50 MG PO TABS
50.0000 mg | ORAL_TABLET | Freq: Once | ORAL | Status: AC
  Administered 2018-07-05: 50 mg via ORAL
  Filled 2018-07-05: qty 1

## 2018-07-05 MED ORDER — VANCOMYCIN 50 MG/ML ORAL SOLUTION
125.0000 mg | Freq: Four times a day (QID) | ORAL | Status: DC
  Administered 2018-07-05 – 2018-07-06 (×3): 125 mg via ORAL
  Filled 2018-07-05 (×8): qty 2.5

## 2018-07-05 MED ORDER — VANCOMYCIN HCL 125 MG PO CAPS: 125.0000 mg | ORAL_CAPSULE | Freq: Four times a day (QID) | ORAL | Status: DC

## 2018-07-05 NOTE — Consult Note (Addendum)
Referring Provider: Dr. Manuella Ghazi  Primary Care Physician:  Sinda Du, MD Primary Gastroenterologist:  Dr. Gala Romney   Date of Admission: 07/04/18 Date of Consultation: 07/05/18  Reason for Consultation:  Colonic ileus   HPI:  Jordan [name] is a 72 y.o. 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 She was last seen Aug 2018. History of chronic diarrhea. Colonoscopy last year with internal hemorrhoids, diverticulosis. Remote history of adenomas.   History of neuromuscular dysfunction of bladder and colon, with presentation to ED yesterday with worsening abdominal distension and no BM or urinating since Friday of last week. SNF started Flagyl on 10/24 at facility. CT yetserday with diffuse colonic dilatation, without convincing wall thickening or pericolonic inflammatory changes. Favoring adynamic/paralytic colonic ileus. No SBO. Worsening renal function, with nephrology consulted.   She states that she had been given Augmentin several weeks ago for sinus infection. Notes abdominal bloating, discomfort lower abdomen. No diarrhea, no flatus since admission. History of chronic diarrhea with worsening around the 24th. No N/V. Baseline bowel habits are 2-3 times per day. Fair appetite historically. Limited mobility, using wheelchair.   Past Medical History:  Diagnosis Date  . Allergy    seasonal  . Anemia   . Arthritis   . Asthma    uses Advair and Spiriva daily,Albuterol prn.Takes Singulair at bedtime and Prednisone daily  . Back spasm    takes Zanaflex daily as needed  . Blood transfusion 2007   no abnormal reation noted   . Bruises easily    d/t being on Prednisone  . Bursitis    left elbow  . C. difficile colitis    2007: treated with Flagyl and Vanc, 2014: treated with vanc, Aug 2014: Vanc taper  . Chronic back pain   . Chronic kidney disease     "creatine" creeping up - followed at this time by PCP  . Complication of anesthesia 2000   woke up during one surgery- ovary surgery  . COPD (chronic obstructive pulmonary disease) ([place])   . Depression    takes Prozac daily  . Dysphasia   . Falls   . H/O hiatal hernia   . Hemorrhoids   . History of bronchitis    last time a yr ago  . History of colon polyps   . History of kidney stones   . History of kidney stones   . Hyperlipidemia    hx of-was on meds but has been off x 1 1/2 yrs  . Hypertension    takes Verapamil nightly  . IBS (irritable bowel syndrome)   . Insomnia    takes Benadryl as needed  . 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 Neurogenic bowel   . Neuromuscular dysfunction of bladder, unspecified   . Nocturia   . Osteoporosis    takes Fosamax weekly  . Pneumonia    MRSA pneumonia in 2007  . Shortness of breath    with exertion daily  . Stroke [facility])    "they say I has a small stroke".No deficits  . Tingling    and pain in left leg  . Urinary frequency     Past Surgical History:  Procedure Laterality Date  . APPENDECTOMY    . BIOPSY  04/13/2017   Procedure: BIOPSY;  Surgeon: Daneil Dolin, MD;  [facility];  Service: Endoscopy;;  right and left colon   . CARPAL TUNNEL RELEASE Left   . CARPAL TUNNEL RELEASE Right 09/21/2015   Procedure: CARPAL TUNNEL RELEASE;  Surgeon: Carole Civil, MD;  Location: [facility];  Service: Orthopedics;  Laterality: Right;  . cataract surgery Bilateral   . CHOLECYSTECTOMY    . COLONOSCOPY   09/24/2006   RMR: Normal rectum, left-sided diverticula  . COLONOSCOPY  Oct 2012   Dr. Henrene Pastor: tubular adenomas, moderative diverticulosis, internal hemorrhoids  . COLONOSCOPY WITH PROPOFOL N/A 04/13/2017   Procedure: COLONOSCOPY WITH PROPOFOL;  Surgeon: Daneil Dolin, MD;  [facility];  Service: Endoscopy;  Laterality: N/A;  8:30am  . CYSTOSCOPY    . ENDARTERECTOMY Right 12/12/2015    Procedure: ENDARTERECTOMY RIGHT CAROTID, RESECTION OF REDUNDANT RIGHT COMMON CAROTID ARTERY WITH PRIMARY REANASTOMOSIS;  Surgeon: Mal Misty, MD;  Location: [place];  Service: Vascular;  Laterality: Right;  . ESOPHAGOGASTRODUODENOSCOPY  04/07/2006   WJX:BJYNWG esophagus s/p placement of Bravo pH probe, some surgical changes but not typical of fundoplication wrap, may have slipped  . GANGLION CYST EXCISION Right 09/21/2015   Procedure: RIGHT WRIST GANGLION CYST REMOVAL;  Surgeon: Carole Civil, MD;  Location: [facility];  Service: Orthopedics;  Laterality: Right;  . hemorrhoidal banding    . HERNIA REPAIR     umbilical  . KNEE ARTHROSCOPY Right   . KYPHOPLASTY N/A 02/23/2018   Procedure: KYPHOPLASTY LUMBAR FOUR;  Surgeon: Newman Pies, MD;  Location: [place];  Service: Neurosurgery;  Laterality: N/A;  . LUMBAR LAMINECTOMY/DECOMPRESSION MICRODISCECTOMY Left 10/17/2013   Procedure: LUMBAR LAMINECTOMY/DECOMPRESSION MICRODISCECTOMY 1 LEVEL three/four;  Surgeon: Ophelia Charter, MD;  Location: [place] [facility];  Service: Neurosurgery;  Laterality: Left;  . NECK SURGERY     fusion-   . NISSEN FUNDOPLICATION     x 2  . OLECRANON BURSECTOMY Left 01/11/2014   Procedure: OLECRANON BURSECTOMY;  Surgeon: Carole Civil, MD;  Location: [facility];  Service: Orthopedics;  Laterality: Left;  . OOPHORECTOMY Right   . PATCH ANGIOPLASTY Right 12/12/2015   Procedure: PATCH ANGIOPLASTY RIGHT CAROTID ARTERY USING HEMASHIELD PLATINUM FINESSE PATCH;  Surgeon: Mal Misty, MD;  Location: [place];  Service: Vascular;  Laterality: Right;  . TONSILLECTOMY      Prior to Admission medications   Medication Sig Start Date End Date Taking? Authorizing Provider  AMINO ACIDS-PROTEIN HYDROLYS PO Take 30 mLs by mouth 2 (two) times daily.   Yes [provider]  budesonide-formoterol (SYMBICORT) 160-4.5 MCG/ACT inhaler Inhale 2 puffs into the lungs 2 (two) times daily.   Yes [provider]  dicyclomine (BENTYL)  20 MG tablet Take 20 mg by mouth every 6 (six) hours.   Yes [provider]  ferrous sulfate 325 (65 FE) MG tablet Take 325 mg by mouth 2 (two) times daily with a meal.   Yes [provider]  FLUoxetine (PROZAC) 40 MG capsule Take 40 mg by mouth daily.     Yes [provider]  folic acid (FOLVITE) 1 MG tablet Take 1 mg by mouth daily.   Yes [provider]  gabapentin (NEURONTIN) 400 MG capsule Take 400 mg by mouth 2 (two) times daily.   Yes [provider]  guaifenesin (HUMIBID E) 400 MG TABS tablet Take 400 mg by mouth 3 (three) times daily. For 10 days   Yes [provider]  HYDROcodone-acetaminophen (NORCO/VICODIN) 5-325 MG tablet Take 2 tablets by mouth every 6 (six) hours as needed  for moderate pain. 02/24/18  Yes Eustace Moore, MD  lisinopril (PRINIVIL,ZESTRIL) 20 MG tablet Take 20 mg by mouth daily.   Yes [provider]  metroNIDAZOLE (FLAGYL) 500 MG tablet Take 500 mg by mouth 3 (three) times daily.   Yes [provider]  mirtazapine (REMERON) 15 MG tablet Take 15 mg by mouth at bedtime.   Yes [provider]  montelukast (SINGULAIR) 10 MG tablet Take 10 mg by mouth daily.    Yes [provider]  OXYGEN Inhale 2 L into the lungs as needed (TO MAINTAIN O2 LEVELS).   Yes [provider]  pantoprazole (PROTONIX) 40 MG tablet Take 40 mg by mouth daily.   Yes [provider]  polycarbophil (FIBERCON) 625 MG tablet Take by mouth.   Yes [provider]  predniSONE (DELTASONE) 10 MG tablet Take 5 mg by mouth daily with breakfast.    Yes [provider]  Probiotic Product (ALIGN) 4 MG CAPS Take 1 capsule by mouth daily.   Yes [provider]  thiamine 100 MG tablet Take 100 mg by mouth daily.   Yes [provider]  vitamin C (ASCORBIC ACID) 500 MG tablet Take 500 mg by mouth 2 (two) times daily.   Yes [provider]  acetaminophen (TYLENOL) 325  MG tablet Take 650 mg by mouth 4 (four) times daily.    [provider]  amoxicillin-clavulanate (AUGMENTIN) 875-125 MG tablet Take 1 tablet by mouth 2 (two) times daily. For 10 days 06/28/18   [provider]  diclofenac sodium (VOLTAREN) 1 % GEL Apply 4 g topically 4 (four) times daily.    [provider]  docusate sodium (COLACE) 100 MG capsule Take 1 capsule (100 mg total) by mouth every 12 (twelve) hours. While taking oxycodone 01/21/18   Mesner, Corene Cornea, MD  ipratropium-albuterol (DUONEB) 0.5-2.5 (3) MG/3ML SOLN Take 3 mLs by nebulization every 6 (six) hours as needed (SOB).    [provider]  Lidocaine 4 % PTCH Apply 1 patch topically daily. Left hip    [provider]  Melatonin 3 MG TABS Take 6 mg by mouth at bedtime.    [provider]  ondansetron (ZOFRAN-ODT) 4 MG disintegrating tablet Take 4 mg by mouth every 4 (four) hours as needed for nausea or vomiting.    [provider]  polycarbophil (FIBERCON) 625 MG tablet Take 625 mg by mouth daily.    [provider]  tiZANidine (ZANAFLEX) 2 MG tablet Take 2 mg by mouth every 8 (eight) hours as needed for muscle spasms.    [provider]    Current Facility-Administered Medications  Medication Dose Route Frequency Provider Last Rate Last Dose  . 0.9 %  sodium chloride infusion   Intravenous Continuous Heath Lark D, DO 75 mL/hr at 07/04/18 2130    . 0.9 %  sodium chloride infusion  250 mL Intravenous PRN Manuella Ghazi, Pratik D, DO      . acetaminophen (TYLENOL) tablet 650 mg  650 mg Oral Q6H PRN Heath Lark D, DO   650 mg at 07/04/18 2001   Or  . acetaminophen (TYLENOL) suppository 650 mg  650 mg Rectal Q6H PRN Manuella Ghazi, Pratik D, DO      . diclofenac sodium (VOLTAREN) 1 % transdermal gel 4 g  4 g Topical QID Manuella Ghazi, Pratik D, DO   4 g at 07/04/18 2239  . FLUoxetine (PROZAC) capsule 40 mg  40 mg Oral Daily Manuella Ghazi, Pratik D, DO      .  heparin injection 5,000 Units  5,000  Units Subcutaneous Q8H Shah, Pratik D, DO   5,000 Units at 07/05/18 0549  . ipratropium-albuterol (DUONEB) 0.5-2.5 (3) MG/3ML nebulizer solution 3 mL  3 mL Nebulization Q6H PRN Manuella Ghazi, Pratik D, DO      . lidocaine (LIDODERM) 5 % 1 patch  1 patch Transdermal Daily Shah, Pratik D, DO      . mirtazapine (REMERON) tablet 15 mg  15 mg Oral QHS Shah, Pratik D, DO   15 mg at 07/04/18 2134  . mometasone-formoterol (DULERA) 200-5 MCG/ACT inhaler 2 puff  2 puff Inhalation BID Manuella Ghazi, Pratik D, DO   2 puff at 07/05/18 0723  . ondansetron (ZOFRAN) tablet 4 mg  4 mg Oral Q6H PRN Manuella Ghazi, Pratik D, DO       Or  . ondansetron (ZOFRAN) injection 4 mg  4 mg Intravenous Q6H PRN Manuella Ghazi, Pratik D, DO      . predniSONE (DELTASONE) tablet 5 mg  5 mg Oral Q breakfast Manuella Ghazi, Pratik D, DO      . sodium chloride flush (NS) 0.9 % injection 3 mL  3 mL Intravenous Q12H Shah, Pratik D, DO      . sodium chloride flush (NS) 0.9 % injection 3 mL  3 mL Intravenous PRN Manuella Ghazi, Pratik D, DO        Allergies as of 07/04/2018 - Review Complete 07/04/2018  Allergen Reaction Noted  . Cardura [doxazosin mesylate] Hives 09/14/2013  . Barbiturates Nausea And Vomiting and Rash 05/04/2009  . Levofloxacin Rash 12/06/2015  . Sulfa antibiotics Nausea Only and Rash 07/07/2011    Family History  Problem Relation Age of Onset  . Heart disease Father   . Colon cancer Neg Hx     Social History   Socioeconomic History  . Marital status: Divorced    Spouse name: Not on file  . Number of children: Not on file  . Years of education: Not on file  . Highest education level: Not on file  Occupational History  . Not on file  Social Needs  . Financial resource strain: Not on file  . Food insecurity:    Worry: Not on file    Inability: Not on file  . Transportation needs:    Medical: Not on file    Non-medical: Not on file  Tobacco Use  . Smoking status: Never Smoker  . Smokeless tobacco: Never Used  Substance and Sexual Activity  . Alcohol  use: Not Currently    Alcohol/week: 10.0 standard drinks    Types: 10 Cans of beer per week    Comment: couple of beers daily  . Drug use: No  . Sexual activity: Never    Birth control/protection: Post-menopausal  Lifestyle  . Physical activity:    Days per week: Not on file    Minutes per session: Not on file  . Stress: Not on file  Relationships  . Social connections:    Talks on phone: Not on file    Gets together: Not on file    Attends religious service: Not on file    Active member of club or organization: Not on file    Attends meetings of clubs or organizations: Not on file    Relationship status: Not on file  . Intimate partner violence:    Fear of current or ex partner: Not on file    Emotionally abused: Not on file    Physically abused: Not on file    Forced sexual activity:  Not on file  Other Topics Concern  . Not on file  Social History Narrative  . Not on file    Review of Systems: Gen: Denies fever, chills, loss of appetite, change in weight or weight loss CV: Denies chest pain, heart palpitations, syncope, edema  Resp: Denies shortness of breath with rest, cough, wheezing GI: see HPI  GU : Denies urinary burning, urinary frequency, urinary incontinence.  MS: see HPI  Derm: Denies rash, itching, dry skin Psych: Denies depression, anxiety,confusion, or memory loss Heme: Denies bruising, bleeding, and enlarged lymph nodes.  Physical Exam: Vital signs in last 24 hours: Temp:  [97.8 F (36.6 C)-98.2 F (36.8 C)] 97.8 F (36.6 C) (10/28 0546) Pulse Rate:  [64-143] 64 (10/28 0558) Resp:  [16-26] 19 (10/28 0546) BP: (90-138)/(60-78) 115/60 (10/28 0558) SpO2:  [95 %-100 %] 99 % (10/28 0723) Last BM Date: 06/30/18  Last Weight  Most recent update: 07/04/2018  7:27 AM   Weight  49.9 kg (110 lb)             General:   Alert, thin, chronically-ill appearing Head:  Normocephalic and atraumatic. Eyes:  Sclera clear, no icterus.   Ears:  Normal auditory  acuity. Nose:  No deformity, discharge,  or lesions. Mouth:  No deformity or lesions, dentition normal. Lungs:  Clear throughout to auscultation.   Heart:  S1 S2 present without murmurs  Abdomen:  Distended, tight, TTP, hypoactive, tympanitic bowel sounds Rectal:  Deferred  Msk:  Symmetrical without gross deformities. Normal posture. Extremities:  Without  edema. Neurologic:  Alert and  oriented x 4 Psych:  Alert and cooperative. Normal mood and affect.  Intake/Output from previous day: 10/27 0701 - 10/28 0700 In: 2385.2 [I.V.:1788.4; IV Piggyback:596.8] Out: 1000 [Urine:1000] Intake/Output this shift: No intake/output data recorded.  Lab Results: Recent Labs    07/04/18 0729 07/05/18 0635  WBC 9.5 6.5  HGB 11.6* 10.3*  HCT 37.4 34.5*  PLT 277 237   BMET Recent Labs    07/04/18 0729 07/05/18 0635  NA 135 136  K 2.8* 3.5  CL 105 111  CO2 17* 14*  GLUCOSE 165* 106*  BUN 76* 77*  CREATININE 4.05* 4.24*  CALCIUM 7.8* 7.3*   LFT Recent Labs    07/04/18 0729  PROT 6.2*  ALBUMIN 2.5*  AST 12*  ALT 15  ALKPHOS 69  BILITOT 0.7    Studies/Results: Ct Abdomen Pelvis Wo Contrast  Result Date: 07/04/2018 CLINICAL DATA:  Abdominal distension, nausea, on Flagyl for C diff EXAM: CT ABDOMEN AND PELVIS WITHOUT CONTRAST TECHNIQUE: Multidetector CT imaging of the abdomen and pelvis was performed following the standard protocol without IV contrast. COMPARISON:  05/05/2017 FINDINGS: Lower chest: Small bilateral pleural effusions. Hepatobiliary: Unenhanced liver is unremarkable. Status post cholecystectomy. No intrahepatic or extrahepatic ductal dilatation. Pancreas: Within normal limits. Spleen: Within normal limits. Adrenals/Urinary Tract: Adrenal glands within normal limits. Right renal cortical atrophy/scarring. 8 mm nonobstructing right lower pole renal calculus (series 2/image 29). No hydronephrosis. Bladder is mildly thick-walled although underdistended. Stomach/Bowel:  Stomach is notable for a small hiatal hernia. No evidence of small bowel obstruction. Diffuse colonic dilatation, measuring up to 9.4 cm in the cecum, which is mobile and located in the left mid abdomen (series 2/image 44). However, there is no significant rectum may be mildly thick-walled (series 2/image 77), but this is likely exacerbated by underdistention. No pneumatosis or free air. Vascular/Lymphatic: No evidence of abdominal aortic aneurysm. Atherosclerotic calcifications of the abdominal aorta and branch  vessels. No suspicious abdominopelvic lymphadenopathy. Reproductive: Uterus is unremarkable. No adnexal masses. Other: Small volume perihepatic ascites. Trace pelvic ascites. Musculoskeletal: Prior vertebral augmentation at L4. Moderate compression fracture deformity at T12. Mild compression fracture deformity at L1. These findings are unchanged. Mild degenerative changes of the lumbar spine with dextroscoliosis. IMPRESSION: Diffuse colonic dilatation, without convincing wall thickening or pericolonic inflammatory changes despite the known history of C difficile colitis. As such, the overall appearance favors adynamic/paralytic colonic ileus. No pneumatosis or free air.  No evidence of small bowel obstruction. Small volume abdominopelvic ascites. Small bilateral pleural effusions. Electronically Signed   By: Julian Hy M.D.   On: 07/04/2018 14:23   Dg Abd Acute W/chest  Result Date: 07/04/2018 CLINICAL DATA:  Abdominal pain, distension EXAM: DG ABDOMEN ACUTE W/ 1V CHEST COMPARISON:  Chest radiograph dated 04/27/2017 FINDINGS: Lungs are clear.  No pleural effusion or pneumothorax. The heart is normal in size. Colonic distension, suggesting adynamic colonic ileus. No findings to suggest small bowel obstruction. No evidence of free air under the diaphragm on the upright view. Mild degenerative changes of the lumbar spine with lumbar dextroscoliosis. Vertebral augmentation at L4. Cholecystectomy  clips. IMPRESSION: No evidence of acute cardiopulmonary disease. Suspected adynamic colonic ileus. No findings to suggest small bowel obstruction or free air. Electronically Signed   By: Julian Hy M.D.   On: 07/04/2018 09:20    Impression: 72 year old female with history of CDI in 2014 and refractory CDI in 2014, presenting with colonic ileus. Limited historian, but she reports exposure to Augmentin about a week or two ago, with notes in epic stating concern for CDI and started on Flagyl 10/24. Baseline bowel habits 2-3 per day, with no BM since Friday. CT without convincing wall thickening or pericolonic changes. Colonic dilatation measuring up to 9.4 cm in cecum. Unclear if ileus is related to possible CDI at this time; I have contacted [facility] to request reports, as none are available in chart. No leukocytosis noted, but she could have suppressed immune response. Ultimately, continue with supportive measures, NPO except meds, hydration, avoid anti-spasmodics (was possibly on Bentyl as outpatient?) and obtain reports from SNF. She is at high risk for CDI with multiple risk factors.   Plan: NPO except meds  turn every 2 hours  Abd xray 2 view today to assess for interval changes Requesting records from [facility]: if documented CDI, needs to start oral vancomycin QID and Flagyl IV TID. Would not pursue vanc enemas due to risk of colonic perforation Check TSH Monitor for worsening pain, signs of acute abdomen    Annitta Needs, PhD, ANP-BC [facility]        LOS: 1 day    07/05/2018, 8:52 AM

## 2018-07-05 NOTE — Clinical Social Work Note (Signed)
Clinical Social Work Assessment  Patient Details  Name: Jordan [name] MRN: [id number] Date of Birth: 08-Jul-1946  Date of referral:  07/05/18               Reason for consult:  Discharge Planning                Permission sought to share information with:    Permission granted to share information::     Name::        Agency::     Relationship::     Contact Information:     Housing/Transportation Living arrangements for the past 2 months:  [place] of Information:  Facility Patient Interpreter Needed:  None Criminal Activity/Legal Involvement Pertinent to Current Situation/Hospitalization:  No - Comment as needed Significant Relationships:  Siblings, Friend Lives with:  Facility Resident Do you feel safe going back to the place where you live?  Yes Need for family participation in patient care:  No (Coment)  Care giving concerns: Pt resides in LTC at [facility].   Social Worker assessment / plan: Pt is a 72 year old female admitted from [facility] and she will return there at dc.   Employment status:  Retired Nurse, adult PT Recommendations:  Not assessed at this time Information / Referral to community resources:     Patient/Family's Response to care: Pt accepting of care.  Patient/Family's Understanding of and Emotional Response to Diagnosis, Current Treatment, and Prognosis: Pt appears to have a good understanding of diagnosis and treatment recommendations. No emotional distress identified.  Emotional Assessment Appearance:  Appears stated age Attitude/Demeanor/Rapport:  Engaged Affect (typically observed):  Calm, Pleasant Orientation:  Oriented to Self, Oriented to Place, Oriented to  Time, Oriented to Situation Alcohol / Substance use:  Not Applicable Psych involvement (Current and /or in the community):  No (Comment)  Discharge Needs  Concerns to be addressed:  Discharge Planning Concerns Readmission within the  last 30 days:  No Current discharge risk:  None Barriers to Discharge:  No Barriers Identified   Shade Flood, LCSW 07/05/2018, 12:19 PM

## 2018-07-05 NOTE — Consult Note (Signed)
Reason for Consult: Worsening of renal failure Referring Physician: Dr. Elesa Massed is an 72 y.o. female.  HPI: She is a patient who has history of lumbar compression fracture, hypertension, irritable bowel syndrome and chronic renal failure presently came with complaints of abdominal distention, no bowel movement oliguria.  Patient was initially treated for possible C. difficile colitis with Flagyl.  However patient developed constipation and abdominal distention.  Presently she states that she is not moving her bowel or gas.  She denies any nausea or vomiting.  Complains of still abdominal pain.  Patient denies any previous history of renal failure or kidney stone.  Past Medical History:  Diagnosis Date  . Allergy    seasonal  . Anemia   . Arthritis   . Asthma    uses Advair and Spiriva daily,Albuterol prn.Takes Singulair at bedtime and Prednisone daily  . Back spasm    takes Zanaflex daily as needed  . Blood transfusion 2007   no abnormal reation noted   . Bruises easily    d/t being on Prednisone  . Bursitis    left elbow  . C. difficile colitis    2007: treated with Flagyl and Vanc, 2014: treated with vanc, Aug 2014: Vanc taper  . Chronic back pain   . Chronic kidney disease    "creatine" creeping up - followed at this time by PCP  . Complication of anesthesia 2000   woke up during one surgery- ovary surgery  . COPD (chronic obstructive pulmonary disease) ([place])   . Depression    takes Prozac daily  . Dysphasia   . Falls   . H/O hiatal hernia   . Hemorrhoids   . History of bronchitis    last time a yr ago  . History of colon polyps   . History of kidney stones   . History of kidney stones   . Hyperlipidemia    hx of-was on meds but has been off x 1 1/2 yrs  . Hypertension    takes Verapamil nightly  . IBS (irritable bowel syndrome)   . Insomnia    takes Benadryl as needed  . Myocardial infarction [facility]) 2007   Takotsubo cardiomyopathy  . Neurogenic bowel    . Neuromuscular dysfunction of bladder, unspecified   . Nocturia   . Osteoporosis    takes Fosamax weekly  . Pneumonia    MRSA pneumonia in 2007  . Shortness of breath    with exertion daily  . Stroke [facility])    "they say I has a small stroke".No deficits  . Tingling    and pain in left leg  . Urinary frequency     Past Surgical History:  Procedure Laterality Date  . APPENDECTOMY    . BIOPSY  04/13/2017   Procedure: BIOPSY;  Surgeon: Daneil Dolin, MD;  [facility]: [facility];  Service: Endoscopy;;  right and left colon   . CARPAL TUNNEL RELEASE Left   . CARPAL TUNNEL RELEASE Right 09/21/2015   Procedure: CARPAL TUNNEL RELEASE;  Surgeon: Carole Civil, MD;  Location: [facility] ORS;  Service: Orthopedics;  Laterality: Right;  . cataract surgery Bilateral   . CHOLECYSTECTOMY    . COLONOSCOPY   09/24/2006   RMR: Normal rectum, left-sided diverticula  . COLONOSCOPY  Oct 2012   Dr. Henrene Pastor: tubular adenomas, moderative diverticulosis, internal hemorrhoids  . COLONOSCOPY WITH PROPOFOL N/A 04/13/2017   Dr. Gala Romney: internal hemorrhoids, diverticulosis  . CYSTOSCOPY    . ENDARTERECTOMY Right  12/12/2015   Procedure: ENDARTERECTOMY RIGHT CAROTID, RESECTION OF REDUNDANT RIGHT COMMON CAROTID ARTERY WITH PRIMARY REANASTOMOSIS;  Surgeon: Mal Misty, MD;  Location: [place];  Service: Vascular;  Laterality: Right;  . ESOPHAGOGASTRODUODENOSCOPY  04/07/2006   ZOX:WRUEAV esophagus [name]/p placement of Bravo pH probe, some surgical changes but not typical of fundoplication wrap, may have slipped  . GANGLION CYST EXCISION Right 09/21/2015   Procedure: RIGHT WRIST GANGLION CYST REMOVAL;  Surgeon: Carole Civil, MD;  Location: [facility];  Service: Orthopedics;  Laterality: Right;  . hemorrhoidal banding    . HERNIA REPAIR     umbilical  . KNEE ARTHROSCOPY Right   . KYPHOPLASTY N/A 02/23/2018   Procedure: KYPHOPLASTY LUMBAR FOUR;  Surgeon: Newman Pies, MD;  Location: [place];  Service: Neurosurgery;   Laterality: N/A;  . LUMBAR LAMINECTOMY/DECOMPRESSION MICRODISCECTOMY Left 10/17/2013   Procedure: LUMBAR LAMINECTOMY/DECOMPRESSION MICRODISCECTOMY 1 LEVEL three/four;  Surgeon: Ophelia Charter, MD;  Location: [place] [facility];  Service: Neurosurgery;  Laterality: Left;  . NECK SURGERY     fusion-   . NISSEN FUNDOPLICATION     x 2  . OLECRANON BURSECTOMY Left 01/11/2014   Procedure: OLECRANON BURSECTOMY;  Surgeon: Carole Civil, MD;  [facility];  Service: Orthopedics;  Laterality: Left;  . OOPHORECTOMY Right   . PATCH ANGIOPLASTY Right 12/12/2015   Procedure: PATCH ANGIOPLASTY RIGHT CAROTID ARTERY USING HEMASHIELD PLATINUM FINESSE PATCH;  Surgeon: Mal Misty, MD;  Location: [place];  Service: Vascular;  Laterality: Right;  . TONSILLECTOMY      Family History  Problem Relation Age of Onset  . Heart disease Father   . Colon cancer Neg Hx     Social History:  reports that she has never smoked. She has never used smokeless tobacco. She reports that she drank about 10.0 standard drinks of alcohol per week. She reports that she does not use drugs.  Allergies:  Allergies  Allergen Reactions  . Cardura [Doxazosin Mesylate] Hives  . Barbiturates Nausea And Vomiting and Rash  . Levofloxacin Rash  . Sulfa Antibiotics Nausea Only and Rash    Medications: I have reviewed the patient'[name] current medications.  Results for orders placed or performed during the hospital encounter of 07/04/18 (from the past 48 hour([name]))  Lipase, blood     Status: None   Collection Time: 07/04/18  7:29 AM  Result Value Ref Range   Lipase 24 11 - 51 U/L    Comment: Performed at [facility], [address], [place], [place] [address]  Comprehensive metabolic panel     Status: Abnormal   Collection Time: 07/04/18  7:29 AM  Result Value Ref Range   Sodium 135 135 - 145 mmol/L   Potassium 2.8 (L) 3.5 - 5.1 mmol/L   Chloride 105 98 - 111 mmol/L   CO2 17 (L) 22 - 32 mmol/L   Glucose, Bld 165 (H) 70 - 99 mg/dL    BUN 76 (H) 8 - 23 mg/dL   Creatinine, Ser 4.05 (H) 0.44 - 1.00 mg/dL   Calcium 7.8 (L) 8.9 - 10.3 mg/dL   Total Protein 6.2 (L) 6.5 - 8.1 g/dL   Albumin 2.5 (L) 3.5 - 5.0 g/dL   AST 12 (L) 15 - 41 U/L   ALT 15 0 - 44 U/L   Alkaline Phosphatase 69 38 - 126 U/L   Total Bilirubin 0.7 0.3 - 1.2 mg/dL   GFR calc non Af Amer 10 (L) >60 mL/min   GFR calc Af  Amer 12 (L) >60 mL/min    Comment: (NOTE) The eGFR has been calculated using the CKD EPI equation. This calculation has not been validated in all clinical situations. eGFR'[name] persistently <60 mL/min signify possible Chronic Kidney Disease.    Anion gap 13 5 - 15    Comment: Performed at [facility], [address], [place], [place] [address]  CBC     Status: Abnormal   Collection Time: 07/04/18  7:29 AM  Result Value Ref Range   WBC 9.5 4.0 - 10.5 K/uL   RBC 3.73 (L) 3.87 - 5.11 MIL/uL   Hemoglobin 11.6 (L) 12.0 - 15.0 g/dL   HCT 37.4 36.0 - 46.0 %   MCV 100.3 (H) 80.0 - 100.0 fL   MCH 31.1 26.0 - 34.0 pg   MCHC 31.0 30.0 - 36.0 g/dL   RDW 13.2 11.5 - 15.5 %   Platelets 277 150 - 400 K/uL   nRBC 0.0 0.0 - 0.2 %    Comment: Performed at [facility], [address], [place], [place] [address]  Magnesium     Status: Abnormal   Collection Time: 07/04/18  7:29 AM  Result Value Ref Range   Magnesium 3.3 (H) 1.7 - 2.4 mg/dL    Comment: Performed at [facility], [address], [place], [place] [address]  Urinalysis, Routine w reflex microscopic     Status: Abnormal   Collection Time: 07/04/18 10:14 AM  Result Value Ref Range   Color, Urine AMBER (A) YELLOW    Comment: BIOCHEMICALS MAY BE AFFECTED BY COLOR   APPearance CLOUDY (A) CLEAR   Specific Gravity, Urine 1.016 1.005 - 1.030   pH 5.0 5.0 - 8.0   Glucose, UA NEGATIVE NEGATIVE mg/dL   Hgb urine dipstick NEGATIVE NEGATIVE   Bilirubin Urine NEGATIVE NEGATIVE   Ketones, ur 5 (A) NEGATIVE mg/dL   Protein, ur NEGATIVE NEGATIVE mg/dL   Nitrite NEGATIVE NEGATIVE   Leukocytes,  UA NEGATIVE NEGATIVE    Comment: Performed at [facility], [name] [place], [place] [address]  MRSA PCR Screening     Status: None   Collection Time: 07/04/18  5:25 PM  Result Value Ref Range   MRSA by PCR NEGATIVE NEGATIVE    Comment:        The GeneXpert MRSA Assay (FDA approved for NASAL specimens only), is one component of a comprehensive MRSA colonization surveillance program. It is not intended to diagnose MRSA infection nor to guide or monitor treatment for MRSA infections. Performed at [facility], [address] [place], [place] [address]   Basic metabolic panel     Status: Abnormal   Collection Time: 07/05/18  6:35 AM  Result Value Ref Range   Sodium 136 135 - 145 mmol/L   Potassium 3.5 3.5 - 5.1 mmol/L    Comment: DELTA CHECK NOTED   Chloride 111 98 - 111 mmol/L   CO2 14 (L) 22 - 32 mmol/L   Glucose, Bld 106 (H) 70 - 99 mg/dL   BUN 77 (H) 8 - 23 mg/dL   Creatinine, Ser 4.24 (H) 0.44 - 1.00 mg/dL   Calcium 7.3 (L) 8.9 - 10.3 mg/dL   GFR calc non Af Amer 10 (L) >60 mL/min   GFR calc Af Amer 11 (L) >60 mL/min    Comment: (NOTE) The eGFR has been calculated using the CKD EPI equation. This calculation has not been validated in all clinical situations. eGFR'[name] persistently <60 mL/min signify possible Chronic Kidney Disease.  Anion gap 11 5 - 15    Comment: Performed at [facility], [address] [place], [place] [address]  CBC     Status: Abnormal   Collection Time: 07/05/18  6:35 AM  Result Value Ref Range   WBC 6.5 4.0 - 10.5 K/uL   RBC 3.37 (L) 3.87 - 5.11 MIL/uL   Hemoglobin 10.3 (L) 12.0 - 15.0 g/dL   HCT 34.5 (L) 36.0 - 46.0 %   MCV 102.4 (H) 80.0 - 100.0 fL   MCH 30.6 26.0 - 34.0 pg   MCHC 29.9 (L) 30.0 - 36.0 g/dL   RDW 13.5 11.5 - 15.5 %   Platelets 237 150 - 400 K/uL   nRBC 0.0 0.0 - 0.2 %    Comment: Performed at [facility], [address] [place],  [address]    Ct Abdomen Pelvis Wo Contrast  Result Date:  07/04/2018 CLINICAL DATA:  Abdominal distension, nausea, on Flagyl for C diff EXAM: CT ABDOMEN AND PELVIS WITHOUT CONTRAST TECHNIQUE: Multidetector CT imaging of the abdomen and pelvis was performed following the standard protocol without IV contrast. COMPARISON:  05/05/2017 FINDINGS: Lower chest: Small bilateral pleural effusions. Hepatobiliary: Unenhanced liver is unremarkable. Status post cholecystectomy. No intrahepatic or extrahepatic ductal dilatation. Pancreas: Within normal limits. Spleen: Within normal limits. Adrenals/Urinary Tract: Adrenal glands within normal limits. Right renal cortical atrophy/scarring. 8 mm nonobstructing right lower pole renal calculus (series 2/image 29). No hydronephrosis. Bladder is mildly thick-walled although underdistended. Stomach/Bowel: Stomach is notable for a small hiatal hernia. No evidence of small bowel obstruction. Diffuse colonic dilatation, measuring up to 9.4 cm in the cecum, which is mobile and located in the left mid abdomen (series 2/image 44). However, there is no significant rectum may be mildly thick-walled (series 2/image 77), but this is likely exacerbated by underdistention. No pneumatosis or free air. Vascular/Lymphatic: No evidence of abdominal aortic aneurysm. Atherosclerotic calcifications of the abdominal aorta and branch vessels. No suspicious abdominopelvic lymphadenopathy. Reproductive: Uterus is unremarkable. No adnexal masses. Other: Small volume perihepatic ascites. Trace pelvic ascites. Musculoskeletal: Prior vertebral augmentation at L4. Moderate compression fracture deformity at T12. Mild compression fracture deformity at L1. These findings are unchanged. Mild degenerative changes of the lumbar spine with dextroscoliosis. IMPRESSION: Diffuse colonic dilatation, without convincing wall thickening or pericolonic inflammatory changes despite the known history of C difficile colitis. As such, the overall appearance favors adynamic/paralytic  colonic ileus. No pneumatosis or free air.  No evidence of small bowel obstruction. Small volume abdominopelvic ascites. Small bilateral pleural effusions. Electronically Signed   By: Julian Hy M.D.   On: 07/04/2018 14:23   Dg Abd Acute W/chest  Result Date: 07/04/2018 CLINICAL DATA:  Abdominal pain, distension EXAM: DG ABDOMEN ACUTE W/ 1V CHEST COMPARISON:  Chest radiograph dated 04/27/2017 FINDINGS: Lungs are clear.  No pleural effusion or pneumothorax. The heart is normal in size. Colonic distension, suggesting adynamic colonic ileus. No findings to suggest small bowel obstruction. No evidence of free air under the diaphragm on the upright view. Mild degenerative changes of the lumbar spine with lumbar dextroscoliosis. Vertebral augmentation at L4. Cholecystectomy clips. IMPRESSION: No evidence of acute cardiopulmonary disease. Suspected adynamic colonic ileus. No findings to suggest small bowel obstruction or free air. Electronically Signed   By: Julian Hy M.D.   On: 07/04/2018 09:20    Review of Systems  Constitutional: Positive for malaise/fatigue and weight loss. Negative for chills and fever.  Respiratory: Negative for shortness of breath.   Cardiovascular: Negative for chest  pain, orthopnea and PND.  Gastrointestinal: Positive for abdominal pain and constipation. Negative for nausea and vomiting.       Poor appetite  Genitourinary: Negative for dysuria.       Decreased urine output  Musculoskeletal: Positive for back pain.   Blood pressure 115/60, pulse 64, temperature 97.8 F (36.6 C), temperature source Oral, resp. rate 19, height '5\' 2"'  (1.575 m), weight 49.9 kg, SpO2 99 %. Physical Exam  Constitutional: No distress.  Emaciated  Eyes: No scleral icterus.  Neck: No JVD present.  Cardiovascular: Normal rate and regular rhythm.  No murmur heard. Respiratory: No respiratory distress. She has no wheezes. She has no rales.  GI: She exhibits distension. There is  tenderness. There is guarding.  Musculoskeletal: She exhibits no edema.  Neurological: She is alert.    Assessment/Plan: 1] acute kidney injury superimposed on chronic: Most likely secondary to prerenal syndrome/ATN/ACE inhibitor.  Presently her creatinine is significantly higher than her baseline. 2] chronic renal failure: Her creatinine was 1.52 07/19/2016.  Remained 1.45 on 05/05/2017.  Recently on 02/19/2018 her creatinine was 1.67.  That is a stage III chronic renal failure.  Etiology has this moment not clear possibly secondary to hypertension. 3] hypokalemia: Her potassium has corrected. 4] history of neurogenic bladder: Seems to be secondary to lumbar disc compression fracture. 5] distended abdomen: Secondary to ileus 5] hypertension: Her blood pressure is reasonably controlled 6] history of COPD 7] history of CVA 8] low CO2: Possibly metabolic. Plan: 1] agree with discontinuation of ACE inhibitor 2] we will increase normal saline to 135 cc/h 3] we will check venous blood gas 4] we will check renal panel in the morning.  Jordan [name] 07/05/2018, 9:16 AM

## 2018-07-05 NOTE — Progress Notes (Signed)
PROGRESS NOTE    Jordan [name]  OAC:[id number] DOB: 1946/06/04 DOA: 07/04/2018 PCP: Sinda Du, MD   Brief Narrative:  Per HPI: Jordan [name] is a 72 y.o. female with medical history significant for lumbar compression fracture, neuromuscular dysfunction of bladder and colon, CKD stage III, hypertension, dyslipidemia, IBS, COPD, and questionable recent history of C. difficile colitis with initiation of Flagyl approximately 3 to 4 days ago, who presented to the ED with worsening abdominal distention and apparently no bowel movement or urination since Friday of last week.  According to SNF notes, she was started on Flagyl for C. difficile on 10/24 at the facility.  The patient is overall a poor historian, but appears to state that she did not have any significant diarrhea or abdominal pain at that time.  She is noted to have some ongoing nausea but no vomiting and has maintained a poor appetite with poor oral intake.  She states that she has been drinking plenty of water, but continues to remain very thirsty.  She denies any bloody bowel movements, fever, chills, chest pain, or shortness of breath.  She has been admitted with adynamic colonic ileus in the setting of questionable recent C. difficile infection.  She is also noted to have severe AKI on CKD stage III with oliguria.  Additionally, she has severe hypokalemia.  She has been placed on IV fluid and potassium supplementation and has ongoing ileus.  GI and renal consultation pending.   Assessment & Plan:   Principal Problem:   Ileus ([place]) Active Problems:   Essential hypertension   COPD (chronic obstructive pulmonary disease) (HCC)   Lumbar compression fracture (HCC)   AKI (acute kidney injury) (HCC)   Hypokalemia   1. Adynamic colonic ileus in the setting of questionable recent C. difficile colitis-persistent.  Fever patient to be up in chair and will place suppository to help with management.  IV fluid hydration.    Magnesium  within normal limits and potassium has been supplemented.  Appreciate GI assistance. Avoid further Flagyl at this time.  Avoid narcotic pain medications currently. 2. AKI on CKD stage III with oliguria.  Maintain on IV fluid hydration and avoid nephrotoxic agents.  Monitor strict I's and O's with Foley catheter placement due to patient condition and previously noted bladder neurological dysfunction.  Consult to nephrology due to presence of oliguria and severe electrolyte dysfunction. 3. Severe hypokalemia-resolved.  EKG with no significant changes, remove off telemetry this morning and avoid further potassium supplementation and monitor.  Magnesium within normal limits. 4. Essential hypertension.  In the face of soft blood pressure readings currently we will hold lisinopril.  Continue on gentle IV fluid and monitor carefully. 5. History of COPD.  Maintain on Dulera while inpatient and continue Singulair as well as chronic prednisone 5 mg daily.   DVT prophylaxis: Heparin Code Status: Full Family Communication: None at bedside Disposition Plan: [facility] neph[facility] evaluation for treatment of AKI and ileus.  Continue IV fluid and n.p.o. status for now.   Consultants:   GI  Nephrology  Procedures:   None  Antimicrobials:   None   Subjective: Patient seen and evaluated today with no new acute complaints or concerns. No acute concerns or events noted overnight.  She continues to have ongoing abdominal distention and pain.  No flatus or BM noted.  She has mild nausea.  Objective: Vitals:   07/05/18 0536 07/05/18 0546 07/05/18 0558 07/05/18 0723  BP: 90/62 90/62 115/60   Pulse: (!) 143  79 64   Resp:  19    Temp: 97.8 F (36.6 C) 97.8 F (36.6 C)    TempSrc: Axillary Oral    SpO2: 100% 100%  99%  Weight:      Height:        Intake/Output Summary (Last 24 hours) at 07/05/2018 0910 Last data filed at 07/05/2018 0555 Gross per 24 hour  Intake 2385.22 ml  Output 1000 ml    Net 1385.22 ml   Filed Weights   07/04/18 0726  Weight: 49.9 kg    Examination:  General exam: Appears calm and comfortable  Respiratory system: Clear to auscultation. Respiratory effort normal. Cardiovascular system: S1 & S2 heard, RRR. No JVD, murmurs, rubs, gallops or clicks. No pedal edema. Gastrointestinal system: Abdomen is moderate to severely distended, no bowel sounds appreciated and there is tenderness to palpation Central nervous system: Alert and oriented. No focal neurological deficits. Extremities: Symmetric 5 x 5 power. Skin: No rashes, lesions or ulcers Psychiatry: Judgement and insight appear normal. Mood & affect appropriate.  Foley bag with minimal, clear, yellow urine output.    Data Reviewed: I have personally reviewed following labs and imaging studies  CBC: Recent Labs  Lab 07/04/18 0729 07/05/18 0635  WBC 9.5 6.5  HGB 11.6* 10.3*  HCT 37.4 34.5*  MCV 100.3* 102.4*  PLT 277 947   Basic Metabolic Panel: Recent Labs  Lab 07/04/18 0729 07/05/18 0635  NA 135 136  K 2.8* 3.5  CL 105 111  CO2 17* 14*  GLUCOSE 165* 106*  BUN 76* 77*  CREATININE 4.05* 4.24*  CALCIUM 7.8* 7.3*  MG 3.3*  --    GFR: Estimated Creatinine Clearance: 9.4 mL/min (A) (by C-G formula based on SCr of 4.24 mg/dL (H)). Liver Function Tests: Recent Labs  Lab 07/04/18 0729  AST 12*  ALT 15  ALKPHOS 69  BILITOT 0.7  PROT 6.2*  ALBUMIN 2.5*   Recent Labs  Lab 07/04/18 0729  LIPASE 24   No results for input(s): AMMONIA in the last 168 hours. Coagulation Profile: No results for input(s): INR, PROTIME in the last 168 hours. Cardiac Enzymes: No results for input(s): CKTOTAL, CKMB, CKMBINDEX, TROPONINI in the last 168 hours. BNP (last 3 results) No results for input(s): PROBNP in the last 8760 hours. HbA1C: No results for input(s): HGBA1C in the last 72 hours. CBG: No results for input(s): GLUCAP in the last 168 hours. Lipid Profile: No results for input(s):  CHOL, HDL, LDLCALC, TRIG, CHOLHDL, LDLDIRECT in the last 72 hours. Thyroid Function Tests: No results for input(s): TSH, T4TOTAL, FREET4, T3FREE, THYROIDAB in the last 72 hours. Anemia Panel: No results for input(s): VITAMINB12, FOLATE, FERRITIN, TIBC, IRON, RETICCTPCT in the last 72 hours. Sepsis Labs: No results for input(s): PROCALCITON, LATICACIDVEN in the last 168 hours.  Recent Results (from the past 240 hour(s))  MRSA PCR Screening     Status: None   Collection Time: 07/04/18  5:25 PM  Result Value Ref Range Status   MRSA by PCR NEGATIVE NEGATIVE Final    Comment:        The GeneXpert MRSA Assay (FDA approved for NASAL specimens only), is one component of a comprehensive MRSA colonization surveillance program. It is not intended to diagnose MRSA infection nor to guide or monitor treatment for MRSA infections. Performed at [facility], [address] [place], [place] [address]          Radiology Studies: Ct Abdomen Pelvis Wo Contrast  Result Date: 07/04/2018  CLINICAL DATA:  Abdominal distension, nausea, on Flagyl for C diff EXAM: CT ABDOMEN AND PELVIS WITHOUT CONTRAST TECHNIQUE: Multidetector CT imaging of the abdomen and pelvis was performed following the standard protocol without IV contrast. COMPARISON:  05/05/2017 FINDINGS: Lower chest: Small bilateral pleural effusions. Hepatobiliary: Unenhanced liver is unremarkable. Status post cholecystectomy. No intrahepatic or extrahepatic ductal dilatation. Pancreas: Within normal limits. Spleen: Within normal limits. Adrenals/Urinary Tract: Adrenal glands within normal limits. Right renal cortical atrophy/scarring. 8 mm nonobstructing right lower pole renal calculus (series 2/image 29). No hydronephrosis. Bladder is mildly thick-walled although underdistended. Stomach/Bowel: Stomach is notable for a small hiatal hernia. No evidence of small bowel obstruction. Diffuse colonic dilatation, measuring up to 9.4 cm in the cecum, which  is mobile and located in the left mid abdomen (series 2/image 44). However, there is no significant rectum may be mildly thick-walled (series 2/image 77), but this is likely exacerbated by underdistention. No pneumatosis or free air. Vascular/Lymphatic: No evidence of abdominal aortic aneurysm. Atherosclerotic calcifications of the abdominal aorta and branch vessels. No suspicious abdominopelvic lymphadenopathy. Reproductive: Uterus is unremarkable. No adnexal masses. Other: Small volume perihepatic ascites. Trace pelvic ascites. Musculoskeletal: Prior vertebral augmentation at L4. Moderate compression fracture deformity at T12. Mild compression fracture deformity at L1. These findings are unchanged. Mild degenerative changes of the lumbar spine with dextroscoliosis. IMPRESSION: Diffuse colonic dilatation, without convincing wall thickening or pericolonic inflammatory changes despite the known history of C difficile colitis. As such, the overall appearance favors adynamic/paralytic colonic ileus. No pneumatosis or free air.  No evidence of small bowel obstruction. Small volume abdominopelvic ascites. Small bilateral pleural effusions. Electronically Signed   By: Julian Hy M.D.   On: 07/04/2018 14:23   Dg Abd Acute W/chest  Result Date: 07/04/2018 CLINICAL DATA:  Abdominal pain, distension EXAM: DG ABDOMEN ACUTE W/ 1V CHEST COMPARISON:  Chest radiograph dated 04/27/2017 FINDINGS: Lungs are clear.  No pleural effusion or pneumothorax. The heart is normal in size. Colonic distension, suggesting adynamic colonic ileus. No findings to suggest small bowel obstruction. No evidence of free air under the diaphragm on the upright view. Mild degenerative changes of the lumbar spine with lumbar dextroscoliosis. Vertebral augmentation at L4. Cholecystectomy clips. IMPRESSION: No evidence of acute cardiopulmonary disease. Suspected adynamic colonic ileus. No findings to suggest small bowel obstruction or free air.  Electronically Signed   By: Julian Hy M.D.   On: 07/04/2018 09:20        Scheduled Meds: . diclofenac sodium  4 g Topical QID  . FLUoxetine  40 mg Oral Daily  . heparin  5,000 Units Subcutaneous Q8H  . lidocaine  1 patch Transdermal Daily  . mirtazapine  15 mg Oral QHS  . mometasone-formoterol  2 puff Inhalation BID  . predniSONE  5 mg Oral Q breakfast  . sodium chloride flush  3 mL Intravenous Q12H   Continuous Infusions: . sodium chloride 75 mL/hr at 07/04/18 2130  . sodium chloride       LOS: 1 day    Time spent: 30 minutes    Pratik Darleen Crocker, DO [facility] Pager [phone]  If 7PM-7AM, please contact night-coverage [email or web address] Password [email or web address] 07/05/2018, 9:10 AM

## 2018-07-05 NOTE — Progress Notes (Signed)
Received Cdiff results. Testing used was PCR and noted as positive. Two step process not used. Unable to rule out carrier vs active infection with PCR. However, with her clinical presentation and history, high suspicion for CDI due to exposure to Augmentin previously, immunocompromised, in nursing facility, etc.   Vancomycin oral added.

## 2018-07-06 ENCOUNTER — Inpatient Hospital Stay (HOSPITAL_COMMUNITY): Payer: Medicare Other | Admitting: Anesthesiology

## 2018-07-06 ENCOUNTER — Encounter (HOSPITAL_COMMUNITY)
Admission: EM | Disposition: A | Discharge status: Nursing Home (Includes ICF) | Payer: Self-pay | Source: Skilled Nursing Facility | Attending: Family Medicine

## 2018-07-06 ENCOUNTER — Encounter (HOSPITAL_COMMUNITY): Payer: Self-pay | Admitting: *Deleted

## 2018-07-06 ENCOUNTER — Inpatient Hospital Stay (HOSPITAL_COMMUNITY): Payer: Medicare Other

## 2018-07-06 HISTORY — PX: BIOPSY: SHX5522

## 2018-07-06 HISTORY — PX: FLEXIBLE SIGMOIDOSCOPY: SHX5431

## 2018-07-06 LAB — RENAL FUNCTION PANEL
Albumin: 1.9 g/dL — ABNORMAL LOW (ref 3.5–5.0)
Anion gap: 7 (ref 5–15)
BUN: 79 mg/dL — ABNORMAL HIGH (ref 8–23)
CO2: 15 mmol/L — ABNORMAL LOW (ref 22–32)
Calcium: 7.3 mg/dL — ABNORMAL LOW (ref 8.9–10.3)
Chloride: 116 mmol/L — ABNORMAL HIGH (ref 98–111)
Creatinine, Ser: 3.91 mg/dL — ABNORMAL HIGH (ref 0.44–1.00)
GFR calc Af Amer: 12 mL/min — ABNORMAL LOW (ref >60)
GFR calc non Af Amer: 11 mL/min — ABNORMAL LOW (ref >60)
Glucose, Bld: 116 mg/dL — ABNORMAL HIGH (ref 70–99)
Phosphorus: 2.9 mg/dL (ref 2.5–4.6)
Potassium: 4.1 mmol/L (ref 3.5–5.1)
Sodium: 138 mmol/L (ref 135–145)

## 2018-07-06 SURGERY — SIGMOIDOSCOPY, FLEXIBLE
Anesthesia: General

## 2018-07-06 MED ORDER — ALBUTEROL SULFATE (2.5 MG/3ML) 0.083% IN NEBU
INHALATION_SOLUTION | RESPIRATORY_TRACT | Status: AC
  Filled 2018-07-06: qty 3

## 2018-07-06 MED ORDER — BOOST / RESOURCE BREEZE PO LIQD CUSTOM
1.0000 | Freq: Three times a day (TID) | ORAL | Status: DC
  Administered 2018-07-06 – 2018-07-12 (×5): 1 via ORAL

## 2018-07-06 MED ORDER — PROPOFOL 500 MG/50ML IV EMUL
INTRAVENOUS | Status: DC | PRN
  Administered 2018-07-06: 125 ug/kg/min via INTRAVENOUS

## 2018-07-06 MED ORDER — SODIUM CHLORIDE 0.9 % IV SOLN: INTRAVENOUS | Status: DC

## 2018-07-06 MED ORDER — VANCOMYCIN HCL 500 MG IV SOLR
500.0000 mg | Freq: Four times a day (QID) | Status: DC
  Administered 2018-07-06 – 2018-07-18 (×46): 500 mg via RECTAL
  Filled 2018-07-06 (×56): qty 500

## 2018-07-06 MED ORDER — NEOSTIGMINE METHYLSULFATE 3 MG/3ML IV SOSY: PREFILLED_SYRINGE | Freq: Once | INTRAVENOUS | Status: DC

## 2018-07-06 MED ORDER — HYDROCODONE-ACETAMINOPHEN 7.5-325 MG PO TABS: 1.0000 | ORAL_TABLET | Freq: Once | ORAL | Status: DC | PRN

## 2018-07-06 MED ORDER — LACTATED RINGERS IV SOLN: INTRAVENOUS | Status: DC

## 2018-07-06 MED ORDER — PROMETHAZINE HCL 25 MG/ML IJ SOLN: 6.2500 mg | INTRAMUSCULAR | Status: DC | PRN

## 2018-07-06 MED ORDER — FUROSEMIDE 10 MG/ML IJ SOLN
40.0000 mg | Freq: Two times a day (BID) | INTRAMUSCULAR | Status: DC
  Administered 2018-07-06 – 2018-07-11 (×11): 40 mg via INTRAVENOUS
  Filled 2018-07-06 (×11): qty 4

## 2018-07-06 MED ORDER — IPRATROPIUM-ALBUTEROL 0.5-2.5 (3) MG/3ML IN SOLN
3.0000 mL | Freq: Four times a day (QID) | RESPIRATORY_TRACT | Status: DC
  Administered 2018-07-06: 3 mL via RESPIRATORY_TRACT

## 2018-07-06 MED ORDER — VANCOMYCIN 50 MG/ML ORAL SOLUTION
500.0000 mg | Freq: Four times a day (QID) | ORAL | Status: DC
  Administered 2018-07-06 – 2018-07-19 (×51): 500 mg via ORAL
  Filled 2018-07-06 (×60): qty 10

## 2018-07-06 MED ORDER — IPRATROPIUM-ALBUTEROL 0.5-2.5 (3) MG/3ML IN SOLN: 3.0000 mL | Freq: Once | RESPIRATORY_TRACT | Status: DC

## 2018-07-06 MED ORDER — HYDROMORPHONE HCL 1 MG/ML IJ SOLN: 0.2500 mg | INTRAMUSCULAR | Status: DC | PRN

## 2018-07-06 MED ORDER — ATROPINE SULFATE 1 MG/ML IJ SOLN: 1.0000 mg | Freq: Once | INTRAMUSCULAR | Status: DC

## 2018-07-06 MED ORDER — HEPARIN SODIUM (PORCINE) 5000 UNIT/ML IJ SOLN
5000.0000 [IU] | Freq: Three times a day (TID) | INTRAMUSCULAR | Status: DC
  Administered 2018-07-13: 5000 [IU] via SUBCUTANEOUS
  Filled 2018-07-06 (×16): qty 1

## 2018-07-06 MED ORDER — MIDAZOLAM HCL 2 MG/2ML IJ SOLN: 0.5000 mg | Freq: Once | INTRAMUSCULAR | Status: DC | PRN

## 2018-07-06 MED ORDER — LACTATED RINGERS IV SOLN
Freq: Once | INTRAVENOUS | Status: AC
  Administered 2018-07-06: 11:00:00 via INTRAVENOUS

## 2018-07-06 NOTE — Anesthesia Preprocedure Evaluation (Signed)
Anesthesia Evaluation  Patient identified by MRN, date of birth, ID band Patient awake    Reviewed: Allergy & Precautions, NPO status , Patient's Chart, lab work & pertinent test results  Airway Mallampati: II  TM Distance: >3 FB Neck ROM: Full    Dental no notable dental hx. (+) Teeth Intact   Pulmonary shortness of breath and at rest, asthma , pneumonia, COPD,  COPD inhaler,  Appears chronically SOB Uses inhalers  RA sats mid 90s at rest on RA Here for Flex sig - poss decompression of colonic ileus    Pulmonary exam normal breath sounds clear to auscultation + decreased breath sounds      Cardiovascular Exercise Tolerance: Poor hypertension, + Past MI  Normal cardiovascular examII Rhythm:Regular Rate:Normal     Neuro/Psych Depression CVA negative psych ROS   GI/Hepatic Neg liver ROS, hiatal hernia,   Endo/Other  negative endocrine ROS  Renal/GU Renal InsufficiencyRenal diseaseLast Cr 3.9 Never dialyzed currently   negative genitourinary   Musculoskeletal  (+) Arthritis ,   Abdominal   Peds negative pediatric ROS (+)  Hematology negative hematology ROS (+) anemia ,   Anesthesia Other Findings   Reproductive/Obstetrics negative OB ROS                             Anesthesia Physical Anesthesia Plan  ASA: IV  Anesthesia Plan: General   Post-op Pain Management:    Induction: Intravenous  PONV Risk Score and Plan:   Airway Management Planned: Nasal Cannula and Simple Face Mask  Additional Equipment:   Intra-op Plan:   Post-operative Plan: Extubation in OR  Informed Consent: I have reviewed the patients History and Physical, chart, labs and discussed the procedure including the risks, benefits and alternatives for the proposed anesthesia with the patient or authorized representative who has indicated his/her understanding and acceptance.   Dental advisory given  Plan  Discussed with: CRNA  Anesthesia Plan Comments: (GA as tolerated - ETT if needed )        Anesthesia Quick Evaluation

## 2018-07-06 NOTE — Anesthesia Postprocedure Evaluation (Signed)
Anesthesia Post Note  Patient: Jordan [name]  Procedure(s) Performed: FLEXIBLE SIGMOIDOSCOPY WITH PROPOFOL (N/A ) BIOPSY  Patient location during evaluation: PACU Anesthesia Type: General Level of consciousness: awake, awake and alert and oriented Pain management: pain level controlled Vital Signs Assessment: post-procedure vital signs reviewed and stable Respiratory status: spontaneous breathing, nonlabored ventilation and respiratory function stable Cardiovascular status: blood pressure returned to baseline Postop Assessment: no apparent nausea or vomiting Anesthetic complications: no     Last Vitals:  Vitals:   07/06/18 0642 07/06/18 0736  BP: 135/68   Pulse: 87   Resp:    Temp: 36.6 C   SpO2: 97% 94%    Last Pain:  Vitals:   07/06/18 1100  TempSrc:   PainSc: 5                  [name] J

## 2018-07-06 NOTE — Progress Notes (Signed)
Subjective: Interval History: has complaints still abdominal pain and distention.  She is feeling somewhat better.  But she did move her bowels.  Patient denies any difficulty breathing..  Objective: Vital signs in last 24 hours: Temp:  [97.8 F (36.6 C)-98.2 F (36.8 C)] 97.9 F (36.6 C) (10/29 6734) Pulse Rate:  [78-102] 87 (10/29 0642) Resp:  [18] 18 (10/28 2058) BP: (112-135)/(53-68) 135/68 (10/29 0642) SpO2:  [94 %-100 %] 94 % (10/29 0736) Weight change:   Intake/Output from previous day: 10/28 0701 - 10/29 0700 In: 3323.3 [P.O.:600; I.V.:2723.3] Out: 200 [Urine:200] Intake/Output this shift: No intake/output data recorded.  Generally patient is alert and in no apparent distress Chest is clear to auscultation Heart exam revealed regular rate and rhythm Abdomen: Distended, tender and positive bowel sound Extremities she has trace edema  Lab Results: Recent Labs    07/04/18 0729 07/05/18 0635  WBC 9.5 6.5  HGB 11.6* 10.3*  HCT 37.4 34.5*  PLT 277 237   BMET:  Recent Labs    07/05/18 0635 07/06/18 0511  NA 136 138  K 3.5 4.1  CL 111 116*  CO2 14* 15*  GLUCOSE 106* 116*  BUN 77* 79*  CREATININE 4.24* 3.91*  CALCIUM 7.3* 7.3*   No results for input(s): PTH in the last 72 hours. Iron Studies: No results for input(s): IRON, TIBC, TRANSFERRIN, FERRITIN in the last 72 hours.  Studies/Results: Ct Abdomen Pelvis Wo Contrast  Result Date: 07/04/2018 CLINICAL DATA:  Abdominal distension, nausea, on Flagyl for C diff EXAM: CT ABDOMEN AND PELVIS WITHOUT CONTRAST TECHNIQUE: Multidetector CT imaging of the abdomen and pelvis was performed following the standard protocol without IV contrast. COMPARISON:  05/05/2017 FINDINGS: Lower chest: Small bilateral pleural effusions. Hepatobiliary: Unenhanced liver is unremarkable. Status post cholecystectomy. No intrahepatic or extrahepatic ductal dilatation. Pancreas: Within normal limits. Spleen: Within normal limits.  Adrenals/Urinary Tract: Adrenal glands within normal limits. Right renal cortical atrophy/scarring. 8 mm nonobstructing right lower pole renal calculus (series 2/image 29). No hydronephrosis. Bladder is mildly thick-walled although underdistended. Stomach/Bowel: Stomach is notable for a small hiatal hernia. No evidence of small bowel obstruction. Diffuse colonic dilatation, measuring up to 9.4 cm in the cecum, which is mobile and located in the left mid abdomen (series 2/image 44). However, there is no significant rectum may be mildly thick-walled (series 2/image 77), but this is likely exacerbated by underdistention. No pneumatosis or free air. Vascular/Lymphatic: No evidence of abdominal aortic aneurysm. Atherosclerotic calcifications of the abdominal aorta and branch vessels. No suspicious abdominopelvic lymphadenopathy. Reproductive: Uterus is unremarkable. No adnexal masses. Other: Small volume perihepatic ascites. Trace pelvic ascites. Musculoskeletal: Prior vertebral augmentation at L4. Moderate compression fracture deformity at T12. Mild compression fracture deformity at L1. These findings are unchanged. Mild degenerative changes of the lumbar spine with dextroscoliosis. IMPRESSION: Diffuse colonic dilatation, without convincing wall thickening or pericolonic inflammatory changes despite the known history of C difficile colitis. As such, the overall appearance favors adynamic/paralytic colonic ileus. No pneumatosis or free air.  No evidence of small bowel obstruction. Small volume abdominopelvic ascites. Small bilateral pleural effusions. Electronically Signed   By: Julian Hy M.D.   On: 07/04/2018 14:23   Dg Abd 2 Views  Result Date: 07/05/2018 CLINICAL DATA:  Paralytic ileus large intestine EXAM: ABDOMEN - 2 VIEW COMPARISON:  07/04/2018 FINDINGS: Progressive colonic dilatation which is now severe. Negative for free air. Scoliosis and vertebroplasty L4 IMPRESSION: Progression of colonic  dilatation compatible with ileus. Electronically Signed   By: Juanda Crumble  Carlis Abbott M.D.   On: 07/05/2018 18:46   Dg Abd Acute W/chest  Result Date: 07/04/2018 CLINICAL DATA:  Abdominal pain, distension EXAM: DG ABDOMEN ACUTE W/ 1V CHEST COMPARISON:  Chest radiograph dated 04/27/2017 FINDINGS: Lungs are clear.  No pleural effusion or pneumothorax. The heart is normal in size. Colonic distension, suggesting adynamic colonic ileus. No findings to suggest small bowel obstruction. No evidence of free air under the diaphragm on the upright view. Mild degenerative changes of the lumbar spine with lumbar dextroscoliosis. Vertebral augmentation at L4. Cholecystectomy clips. IMPRESSION: No evidence of acute cardiopulmonary disease. Suspected adynamic colonic ileus. No findings to suggest small bowel obstruction or free air. Electronically Signed   By: Julian Hy M.D.   On: 07/04/2018 09:20    I have reviewed the patient's current medications.  Assessment/Plan: 1] acute kidney injury superimposed on chronic.  Presently her BUN and creatinine is improving.  Patient does not have any nausea or vomiting.  Her creatinine remains above her baseline. 2] anemia: Her hemoglobin is within our target goal 3] ileus: Her abdominal distention seems to be somewhat better.  But patient did not move her bowels. 4] bone and mineral disorder: Her calcium and phosphorus is range 5] fluid management: Presently patient is a symptomatic.  She has only had about 200 cc of urine output.  6] hypertension: Her blood pressure is reasonably controlled 7] neurogenic bladder 8] hypokalemia: Her potassium is normal. Plan: 1] we will continue with hydration 2] we will start her on Lasix 40 mg IV twice daily 3] we will check a renal panel in the morning.   LOS: 2 days   Aneesha Holloran S 07/06/2018,8:49 AM

## 2018-07-06 NOTE — Progress Notes (Signed)
Progressive gaseous distension on xray, cecum now spanning over 15 cm versus prior 14 cm. Flex sig orders placed. Remains NPO. Patient aware and understands risks and benefits of procedures.

## 2018-07-06 NOTE — Op Note (Signed)
[facility] Patient Name: Jordan [name] Procedure Date: 07/06/2018 11:14 AM MRN: [id number] Date of Birth: 04/17/1946 Attending MD: Barney Drain MD, MD CSN: [id number] Age: 72 Admit Type: Outpatient Procedure:                Flexible Sigmoidoscopy WITH COLD FORCEPS BIOPSY Indications:              Abnormal abdominal x-ray of the GI tract-CECUM                            14-15 CM ON KUB.PT ON CHRONIC PREDNISONE AND                            AUGMENTIN(10/21 FOR 10 DAYS). AS AN OUTPT STARTED                            ON FLAGYL FOR ABDOMINAL DISTENSTION/POSSIBLE C DIFF. Providers:                Barney Drain MD, MD, Lurline Del, RN, Randa Spike, Technician Referring MD:             Jasper Loser. Luan Pulling MD, MD Medicines:                Propofol per Anesthesia Complications:            No immediate complications. Estimated Blood Loss:     Estimated blood loss was minimal. Procedure:                Pre-Anesthesia Assessment:                           - Prior to the procedure, a History and Physical                            was performed, and patient medications and                            allergies were reviewed. The patient's tolerance of                            previous anesthesia was also reviewed. The risks                            and benefits of the procedure and the sedation                            options and risks were discussed with the patient.                            All questions were answered, and informed consent                            was obtained. Prior Anticoagulants: The patient has  taken no previous anticoagulant or antiplatelet                            agents. ASA Grade Assessment: III - A patient with                            severe systemic disease. After reviewing the risks                            and benefits, the patient was deemed in                            satisfactory  condition to undergo the procedure.                            After obtaining informed consent, the scope was                            passed under direct vision. The [id number]-[id number]                            ([id number]) scope was introduced through the anus and                            advanced to the the sigmoid colon. The flexible                            sigmoidoscopy was technically difficult and complex                            due to poor bowel prep with stool present.                            Successful completion of the procedure was aided by                            lavage. The quality of the bowel preparation was                            poor. Scope In: 11:38:57 AM Scope Out: 11:51:04 AM Total Procedure Duration: 0 hours 12 minutes 7 seconds  Findings:      Hemorrhoids were found on perianal exam.      A diffuse pseudomembrane was found in the recto-sigmoid colon and in the       sigmoid colon. This was biopsied with a cold forceps for histology. Impression:               - Preparation of the colon was poor.                           - Hemorrhoids found on perianal exam.                           - Pseudomembranous enterocolitis WITH MEGACOLON. Moderate Sedation:  Per Anesthesia Care Recommendation:           - Return patient to hospital ward for ongoing care.                           - Continue present medications. INCREASE VANCOMYCIN                            TO 500 MG QID PO AND PR FOR 14 DAYS.                           - Refer to a surgeon today. DISCUSSED WITH DR.                            Arnoldo Morale. Procedure Code(s):        --- Professional ---                           [phone], Sigmoidoscopy, flexible; with biopsy, single                            or multiple Diagnosis Code(s):        --- Professional ---                           K64.9, Unspecified hemorrhoids                           A04.72, Enterocolitis due to Clostridium difficile,                             not specified as recurrent                           R93.3, Abnormal findings on diagnostic imaging of                            other parts of digestive tract CPT copyright 2018 [employer]. All rights reserved. The codes documented in this report are preliminary and upon coder review may  be revised to meet current compliance requirements. Barney Drain, MD Barney Drain MD, MD 07/06/2018 12:10:31 PM This report has been signed electronically. Number of Addenda: 0

## 2018-07-06 NOTE — Progress Notes (Signed)
Subjective: No N/V. Tolerated clears yesterday evening. No flatus. No BM. Feels like abdomen is less distended. Still uncomfortable.   Objective: Vital signs in last 24 hours: Temp:  [97.8 F (36.6 C)-98.2 F (36.8 C)] 97.9 F (36.6 C) (10/29 8416) Pulse Rate:  [78-102] 87 (10/29 0642) Resp:  [18] 18 (10/28 6063) BP: (112-135)/(53-68) 135/68 (10/29 0642) SpO2:  [94 %-100 %] 94 % (10/29 0736) Last BM Date: 06/30/18 General:   Alert and oriented X 4, pleasant, frail appearing Head:  Normocephalic and atraumatic. Abdomen:  Marked distension but slightly softer than yesterday's exam, hypoactive distant bowel sounds, TTP upper abdomen, RUQ Extremities:  Without  edema.  Intake/Output from previous day: 10/28 0701 - 10/29 0700 In: 3323.3 [P.O.:600; I.V.:2723.3] Out: 200 [Urine:200] Intake/Output this shift: No intake/output data recorded.  Lab Results: Recent Labs    07/04/18 0729 07/05/18 0635  WBC 9.5 6.5  HGB 11.6* 10.3*  HCT 37.4 34.5*  PLT 277 237   BMET Recent Labs    07/04/18 0729 07/05/18 0635 07/06/18 0511  NA 135 136 138  K 2.8* 3.5 4.1  CL 105 111 116*  CO2 17* 14* 15*  GLUCOSE 165* 106* 116*  BUN 76* 77* 79*  CREATININE 4.05* 4.24* 3.91*  CALCIUM 7.8* 7.3* 7.3*   LFT Recent Labs    07/04/18 0729 07/06/18 0511  PROT 6.2*  --   ALBUMIN 2.5* 1.9*  AST 12*  --   ALT 15  --   ALKPHOS 69  --   BILITOT 0.7  --      Studies/Results: Ct Abdomen Pelvis Wo Contrast  Result Date: 07/04/2018 CLINICAL DATA:  Abdominal distension, nausea, on Flagyl for C diff EXAM: CT ABDOMEN AND PELVIS WITHOUT CONTRAST TECHNIQUE: Multidetector CT imaging of the abdomen and pelvis was performed following the standard protocol without IV contrast. COMPARISON:  05/05/2017 FINDINGS: Lower chest: Small bilateral pleural effusions. Hepatobiliary: Unenhanced liver is unremarkable. Status post cholecystectomy. No intrahepatic or extrahepatic ductal dilatation. Pancreas:  Within normal limits. Spleen: Within normal limits. Adrenals/Urinary Tract: Adrenal glands within normal limits. Right renal cortical atrophy/scarring. 8 mm nonobstructing right lower pole renal calculus (series 2/image 29). No hydronephrosis. Bladder is mildly thick-walled although underdistended. Stomach/Bowel: Stomach is notable for a small hiatal hernia. No evidence of small bowel obstruction. Diffuse colonic dilatation, measuring up to 9.4 cm in the cecum, which is mobile and located in the left mid abdomen (series 2/image 44). However, there is no significant rectum may be mildly thick-walled (series 2/image 77), but this is likely exacerbated by underdistention. No pneumatosis or free air. Vascular/Lymphatic: No evidence of abdominal aortic aneurysm. Atherosclerotic calcifications of the abdominal aorta and branch vessels. No suspicious abdominopelvic lymphadenopathy. Reproductive: Uterus is unremarkable. No adnexal masses. Other: Small volume perihepatic ascites. Trace pelvic ascites. Musculoskeletal: Prior vertebral augmentation at L4. Moderate compression fracture deformity at T12. Mild compression fracture deformity at L1. These findings are unchanged. Mild degenerative changes of the lumbar spine with dextroscoliosis. IMPRESSION: Diffuse colonic dilatation, without convincing wall thickening or pericolonic inflammatory changes despite the known history of C difficile colitis. As such, the overall appearance favors adynamic/paralytic colonic ileus. No pneumatosis or free air.  No evidence of small bowel obstruction. Small volume abdominopelvic ascites. Small bilateral pleural effusions. Electronically Signed   By: Julian Hy M.D.   On: 07/04/2018 14:23   Dg Abd 2 Views  Result Date: 07/05/2018 CLINICAL DATA:  Paralytic ileus large intestine EXAM: ABDOMEN - 2 VIEW COMPARISON:  07/04/2018 FINDINGS: Progressive  colonic dilatation which is now severe. Negative for free air. Scoliosis and  vertebroplasty L4 IMPRESSION: Progression of colonic dilatation compatible with ileus. Electronically Signed   By: Franchot Gallo M.D.   On: 07/05/2018 18:46   Dg Abd Acute W/chest  Result Date: 07/04/2018 CLINICAL DATA:  Abdominal pain, distension EXAM: DG ABDOMEN ACUTE W/ 1V CHEST COMPARISON:  Chest radiograph dated 04/27/2017 FINDINGS: Lungs are clear.  No pleural effusion or pneumothorax. The heart is normal in size. Colonic distension, suggesting adynamic colonic ileus. No findings to suggest small bowel obstruction. No evidence of free air under the diaphragm on the upright view. Mild degenerative changes of the lumbar spine with lumbar dextroscoliosis. Vertebral augmentation at L4. Cholecystectomy clips. IMPRESSION: No evidence of acute cardiopulmonary disease. Suspected adynamic colonic ileus. No findings to suggest small bowel obstruction or free air. Electronically Signed   By: Julian Hy M.D.   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 Although PCR does not delineate between active infection and asymptomatic carrier, diarrhea was reported prior to admission. Started on oral vanc yesterday. Clinically feels that abdomen is slightly less distended this morning. Awaiting KUB as she may need flex sig today for decompression.     Plan: Remain NPO except sips with meds No heparin subcutaneous received this morning: I have discontinued for today in case of procedure Continue vancomycin 125 po QID Await KUB: may need flex sig today for decompression   Annitta Needs, PhD, ANP-BC [facility]    LOS: 2 days    07/06/2018, 8:38 AM

## 2018-07-06 NOTE — Transfer of Care (Signed)
Immediate Anesthesia Transfer of Care Note  Patient: Jordan [name]  Procedure(s) Performed: FLEXIBLE SIGMOIDOSCOPY WITH PROPOFOL (N/A ) BIOPSY  Patient Location: PACU  Anesthesia Type:General  Level of Consciousness: awake and patient cooperative  Airway & Oxygen Therapy: Patient Spontanous Breathing and Patient connected to nasal cannula oxygen  Post-op Assessment: Report given to RN, Post -op Vital signs reviewed and stable and Patient moving all extremities  Post vital signs: Reviewed and stable  Last Vitals:  Vitals Value Taken Time  BP    Temp    Pulse    Resp    SpO2      Last Pain:  Vitals:   07/06/18 1100  TempSrc:   PainSc: 5          Complications: No apparent anesthesia complications

## 2018-07-06 NOTE — H&P (View-Only) (Signed)
Subjective: No N/V. Tolerated clears yesterday evening. No flatus. No BM. Feels like abdomen is less distended. Still uncomfortable.   Objective: Vital signs in last 24 hours: Temp:  [97.8 F (36.6 C)-98.2 F (36.8 C)] 97.9 F (36.6 C) (10/29 1610) Pulse Rate:  [78-102] 87 (10/29 0642) Resp:  [18] 18 (10/28 9604) BP: (112-135)/(53-68) 135/68 (10/29 0642) SpO2:  [94 %-100 %] 94 % (10/29 0736) Last BM Date: 06/30/18 General:   Alert and oriented X 4, pleasant, frail appearing Head:  Normocephalic and atraumatic. Abdomen:  Marked distension but slightly softer than yesterday's exam, hypoactive distant bowel sounds, TTP upper abdomen, RUQ Extremities:  Without  edema.  Intake/Output from previous day: 10/28 0701 - 10/29 0700 In: 3323.3 [P.O.:600; I.V.:2723.3] Out: 200 [Urine:200] Intake/Output this shift: No intake/output data recorded.  Lab Results: Recent Labs    07/04/18 0729 07/05/18 0635  WBC 9.5 6.5  HGB 11.6* 10.3*  HCT 37.4 34.5*  PLT 277 237   BMET Recent Labs    07/04/18 0729 07/05/18 0635 07/06/18 0511  NA 135 136 138  K 2.8* 3.5 4.1  CL 105 111 116*  CO2 17* 14* 15*  GLUCOSE 165* 106* 116*  BUN 76* 77* 79*  CREATININE 4.05* 4.24* 3.91*  CALCIUM 7.8* 7.3* 7.3*   LFT Recent Labs    07/04/18 0729 07/06/18 0511  PROT 6.2*  --   ALBUMIN 2.5* 1.9*  AST 12*  --   ALT 15  --   ALKPHOS 69  --   BILITOT 0.7  --      Studies/Results: Ct Abdomen Pelvis Wo Contrast  Result Date: 07/04/2018 CLINICAL DATA:  Abdominal distension, nausea, on Flagyl for C diff EXAM: CT ABDOMEN AND PELVIS WITHOUT CONTRAST TECHNIQUE: Multidetector CT imaging of the abdomen and pelvis was performed following the standard protocol without IV contrast. COMPARISON:  05/05/2017 FINDINGS: Lower chest: Small bilateral pleural effusions. Hepatobiliary: Unenhanced liver is unremarkable. Status post cholecystectomy. No intrahepatic or extrahepatic ductal dilatation. Pancreas:  Within normal limits. Spleen: Within normal limits. Adrenals/Urinary Tract: Adrenal glands within normal limits. Right renal cortical atrophy/scarring. 8 mm nonobstructing right lower pole renal calculus (series 2/image 29). No hydronephrosis. Bladder is mildly thick-walled although underdistended. Stomach/Bowel: Stomach is notable for a small hiatal hernia. No evidence of small bowel obstruction. Diffuse colonic dilatation, measuring up to 9.4 cm in the cecum, which is mobile and located in the left mid abdomen (series 2/image 44). However, there is no significant rectum may be mildly thick-walled (series 2/image 77), but this is likely exacerbated by underdistention. No pneumatosis or free air. Vascular/Lymphatic: No evidence of abdominal aortic aneurysm. Atherosclerotic calcifications of the abdominal aorta and branch vessels. No suspicious abdominopelvic lymphadenopathy. Reproductive: Uterus is unremarkable. No adnexal masses. Other: Small volume perihepatic ascites. Trace pelvic ascites. Musculoskeletal: Prior vertebral augmentation at L4. Moderate compression fracture deformity at T12. Mild compression fracture deformity at L1. These findings are unchanged. Mild degenerative changes of the lumbar spine with dextroscoliosis. IMPRESSION: Diffuse colonic dilatation, without convincing wall thickening or pericolonic inflammatory changes despite the known history of C difficile colitis. As such, the overall appearance favors adynamic/paralytic colonic ileus. No pneumatosis or free air.  No evidence of small bowel obstruction. Small volume abdominopelvic ascites. Small bilateral pleural effusions. Electronically Signed   By: Julian Hy M.D.   On: 07/04/2018 14:23   Dg Abd 2 Views  Result Date: 07/05/2018 CLINICAL DATA:  Paralytic ileus large intestine EXAM: ABDOMEN - 2 VIEW COMPARISON:  07/04/2018 FINDINGS: Progressive  colonic dilatation which is now severe. Negative for free air. Scoliosis and  vertebroplasty L4 IMPRESSION: Progression of colonic dilatation compatible with ileus. Electronically Signed   By: Franchot Gallo M.D.   On: 07/05/2018 18:46   Dg Abd Acute W/chest  Result Date: 07/04/2018 CLINICAL DATA:  Abdominal pain, distension EXAM: DG ABDOMEN ACUTE W/ 1V CHEST COMPARISON:  Chest radiograph dated 04/27/2017 FINDINGS: Lungs are clear.  No pleural effusion or pneumothorax. The heart is normal in size. Colonic distension, suggesting adynamic colonic ileus. No findings to suggest small bowel obstruction. No evidence of free air under the diaphragm on the upright view. Mild degenerative changes of the lumbar spine with lumbar dextroscoliosis. Vertebral augmentation at L4. Cholecystectomy clips. IMPRESSION: No evidence of acute cardiopulmonary disease. Suspected adynamic colonic ileus. No findings to suggest small bowel obstruction or free air. Electronically Signed   By: Julian Hy M.D.   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 Although PCR does not delineate between active infection and asymptomatic carrier, diarrhea was reported prior to admission. Started on oral vanc yesterday. Clinically feels that abdomen is slightly less distended this morning. Awaiting KUB as she may need flex sig today for decompression.     Plan: Remain NPO except sips with meds No heparin subcutaneous received this morning: I have discontinued for today in case of procedure Continue vancomycin 125 po QID Await KUB: may need flex sig today for decompression   Annitta Needs, PhD, ANP-BC [facility]    LOS: 2 days    07/06/2018, 8:38 AM

## 2018-07-06 NOTE — Clinical Social Work Note (Signed)
LCSW following. Pt discussed with MD in [facility] today. Per MD, pt is not yet medically stable for dc. Plan remains for return to [facility] at dc. Will follow and assist as needed.

## 2018-07-06 NOTE — Progress Notes (Signed)
PROGRESS NOTE    Jordan [name]  WPY:[id number] DOB: July 19, 1946 DOA: 07/04/2018 PCP: Sinda Du, MD   Brief Narrative:   Per HPI: Jordan [name] a 72 y.o.femalewith medical history significant forlumbar compression fracture, neuromuscular dysfunction of bladder and colon, CKD stage III, hypertension, dyslipidemia, IBS, COPD, and questionable recent history of C. difficile colitis with initiation of Flagyl approximately 3 to 4 days ago, who presented to the ED with worsening abdominal distention and apparently no bowel movement or urination since Friday of last week. According to SNF notes, she was started on Flagyl for C. difficile on 10/24 at the facility. The patient is overall a poor historian, but appears to state that she did not have any significant diarrhea or abdominal pain at that time. She is noted to have some ongoing nausea but no vomiting and has maintained a poor appetite with poor oral intake. She states that she has been drinking plenty of water, but continues to remain very thirsty. She denies any bloody bowel movements, fever, chills, chest pain, or shortness of breath.  She has been admitted with adynamic colonic ileus in the setting of questionable recent C. difficile infection.  She is also noted to have severe AKI on CKD stage III with oliguria.  Additionally, she has severe hypokalemia.  She has been placed on IV fluid with improving creatinine noted.  She is under gone flexible sigmoidoscopy with noted pseudomembranous colitis.  She has been started on increased dose of oral vancomycin as well as per rectal vancomycin.   Assessment & Plan:   Principal Problem:   Ileus ([place]) Active Problems:   Essential hypertension   COPD (chronic obstructive pulmonary disease) (HCC)   Lumbar compression fracture (HCC)   AKI (acute kidney injury) (HCC)   Hypokalemia   1. Adynamic colonic ileus in the setting of recent C. difficile colitis-persistent. Appreciate  GI with flexible sigmoidoscopy today with recommendations for increased dosing of oral vancomycin in addition of per rectal vancomycin.  Case discussed with general surgery who will also follow.  Continue to monitor for symptoms of improvement. 2. AKI on CKD stage III with oliguria-improving. Maintain on IV fluid hydration and avoid nephrotoxic agents. Monitor strict I's and O's with Foley catheter placement due to patient condition and previously noted bladder neurological dysfunction. Appreciate nephrology recommendations with addition of Lasix twice daily today. 3. Severe hypokalemia-resolved. EKG with no significant changes, remove off telemetry this morning and avoid further potassium supplementation and monitor.  Magnesium within normal limits. 4. Essential hypertension. In the face of soft blood pressure readings currently we will hold lisinopril. Continue on gentle IV fluid and monitor carefully. 5. History of COPD. Maintain on Dulera while inpatient and continue Singulair as well as chronic prednisone 5 mg daily.   DVT prophylaxis: Heparin Code Status: Full Family Communication: None at bedside Disposition Plan: [facility] nephrology evaluation for treatment of AKI and ileus.  Continue IV fluid and n.p.o. status for now.   Consultants:   GI  Nephrology  Procedures:   Flex sigmoidoscopy on 10/29 per GI  Antimicrobials:   Oral vancomycin 10/28->  Rectal vancomycin 10/29->  Subjective: Patient seen and evaluated today with no new acute complaints or concerns. No acute concerns or events noted overnight.  She continues to have ongoing abdominal pain with abdominal tenderness.  Objective: Vitals:   07/06/18 1200 07/06/18 1215 07/06/18 1228 07/06/18 1242  BP: 122/64 (!) 130/58 125/60 (!) 122/56  Pulse: 100 (!) 101 80 79  Resp: 18 (!)  25 20   Temp: 98.3 F (36.8 C)   98.5 F (36.9 C)  TempSrc:    Oral  SpO2: 96% 96% 95% 100%  Weight:      Height:         Intake/Output Summary (Last 24 hours) at 07/06/2018 1252 Last data filed at 07/06/2018 1219 Gross per 24 hour  Intake 3323.25 ml  Output 1000 ml  Net 2323.25 ml   Filed Weights   07/04/18 0726  Weight: 49.9 kg    Examination:  General exam: Appears calm and comfortable  Respiratory system: Clear to auscultation. Respiratory effort normal. Cardiovascular system: S1 & S2 heard, RRR. No JVD, murmurs, rubs, gallops or clicks. No pedal edema. Gastrointestinal system: Abdomen is distended and tender to palpation with no bowel sounds appreciated. Central nervous system: Alert and oriented. No focal neurological deficits. Extremities: Symmetric 5 x 5 power. Skin: No rashes, lesions or ulcers Psychiatry: Judgement and insight appear normal. Mood & affect appropriate.     Data Reviewed: I have personally reviewed following labs and imaging studies  CBC: Recent Labs  Lab 07/04/18 0729 07/05/18 0635  WBC 9.5 6.5  HGB 11.6* 10.3*  HCT 37.4 34.5*  MCV 100.3* 102.4*  PLT 277 026   Basic Metabolic Panel: Recent Labs  Lab 07/04/18 0729 07/05/18 0635 07/06/18 0511  NA 135 136 138  K 2.8* 3.5 4.1  CL 105 111 116*  CO2 17* 14* 15*  GLUCOSE 165* 106* 116*  BUN 76* 77* 79*  CREATININE 4.05* 4.24* 3.91*  CALCIUM 7.8* 7.3* 7.3*  MG 3.3*  --   --   PHOS  --   --  2.9   GFR: Estimated Creatinine Clearance: 10.2 mL/min (A) (by C-G formula based on SCr of 3.91 mg/dL (H)). Liver Function Tests: Recent Labs  Lab 07/04/18 0729 07/06/18 0511  AST 12*  --   ALT 15  --   ALKPHOS 69  --   BILITOT 0.7  --   PROT 6.2*  --   ALBUMIN 2.5* 1.9*   Recent Labs  Lab 07/04/18 0729  LIPASE 24   No results for input(s): AMMONIA in the last 168 hours. Coagulation Profile: No results for input(s): INR, PROTIME in the last 168 hours. Cardiac Enzymes: No results for input(s): CKTOTAL, CKMB, CKMBINDEX, TROPONINI in the last 168 hours. BNP (last 3 results) No results for input(s):  PROBNP in the last 8760 hours. HbA1C: No results for input(s): HGBA1C in the last 72 hours. CBG: No results for input(s): GLUCAP in the last 168 hours. Lipid Profile: No results for input(s): CHOL, HDL, LDLCALC, TRIG, CHOLHDL, LDLDIRECT in the last 72 hours. Thyroid Function Tests: Recent Labs    07/04/18 0730  TSH 1.181   Anemia Panel: No results for input(s): VITAMINB12, FOLATE, FERRITIN, TIBC, IRON, RETICCTPCT in the last 72 hours. Sepsis Labs: No results for input(s): PROCALCITON, LATICACIDVEN in the last 168 hours.  Recent Results (from the past 240 hour(s))  MRSA PCR Screening     Status: None   Collection Time: 07/04/18  5:25 PM  Result Value Ref Range Status   MRSA by PCR NEGATIVE NEGATIVE Final    Comment:        The GeneXpert MRSA Assay (FDA approved for NASAL specimens only), is one component of a comprehensive MRSA colonization surveillance program. It is not intended to diagnose MRSA infection nor to guide or monitor treatment for MRSA infections. Performed at [facility], [address], [place], [place] [address]  Radiology Studies: Ct Abdomen Pelvis Wo Contrast  Result Date: 07/04/2018 CLINICAL DATA:  Abdominal distension, nausea, on Flagyl for C diff EXAM: CT ABDOMEN AND PELVIS WITHOUT CONTRAST TECHNIQUE: Multidetector CT imaging of the abdomen and pelvis was performed following the standard protocol without IV contrast. COMPARISON:  05/05/2017 FINDINGS: Lower chest: Small bilateral pleural effusions. Hepatobiliary: Unenhanced liver is unremarkable. Status post cholecystectomy. No intrahepatic or extrahepatic ductal dilatation. Pancreas: Within normal limits. Spleen: Within normal limits. Adrenals/Urinary Tract: Adrenal glands within normal limits. Right renal cortical atrophy/scarring. 8 mm nonobstructing right lower pole renal calculus (series 2/image 29). No hydronephrosis. Bladder is mildly thick-walled although underdistended. Stomach/Bowel:  Stomach is notable for a small hiatal hernia. No evidence of small bowel obstruction. Diffuse colonic dilatation, measuring up to 9.4 cm in the cecum, which is mobile and located in the left mid abdomen (series 2/image 44). However, there is no significant rectum may be mildly thick-walled (series 2/image 77), but this is likely exacerbated by underdistention. No pneumatosis or free air. Vascular/Lymphatic: No evidence of abdominal aortic aneurysm. Atherosclerotic calcifications of the abdominal aorta and branch vessels. No suspicious abdominopelvic lymphadenopathy. Reproductive: Uterus is unremarkable. No adnexal masses. Other: Small volume perihepatic ascites. Trace pelvic ascites. Musculoskeletal: Prior vertebral augmentation at L4. Moderate compression fracture deformity at T12. Mild compression fracture deformity at L1. These findings are unchanged. Mild degenerative changes of the lumbar spine with dextroscoliosis. IMPRESSION: Diffuse colonic dilatation, without convincing wall thickening or pericolonic inflammatory changes despite the known history of C difficile colitis. As such, the overall appearance favors adynamic/paralytic colonic ileus. No pneumatosis or free air.  No evidence of small bowel obstruction. Small volume abdominopelvic ascites. Small bilateral pleural effusions. Electronically Signed   By: Julian Hy M.D.   On: 07/04/2018 14:23   Dg Abd 1 View  Result Date: 07/06/2018 CLINICAL DATA:  72 year old female with ileus. Abdominal pain. History kidney stones. Subsequent encounter. EXAM: ABDOMEN - 1 VIEW COMPARISON:  07/05/2018 plain film examination.  07/04/2018 CT. FINDINGS: Marked gas distended small and large bowel with cecum displaced medially and superiorly. Degree of distention has progressed since prior plain film examination with cecum now spanning over 15 cm versus prior 14 cm. This may represent colonic ileus as previously noted. Underlying inflammatory process or  obstructing lesion were felt to be less likely considerations on prior CT. These possibilities cannot be adequately assessed on plain film exam. The possibility of free intraperitoneal air cannot be assessed on a supine view. Prior cement augmentation L4. Scoliosis lumbar spine with superimposed degenerative changes. IMPRESSION: Progressive gas distended bowel. The medially and superiorly displaced distended cecum now spans over 15 cm versus prior 14 cm. Please see above. These results will be called to the ordering clinician or representative by the Radiologist Assistant, and communication documented in the PACS or zVision Dashboard. Electronically Signed   By: Genia Del M.D.   On: 07/06/2018 09:02   Dg Abd 2 Views  Result Date: 07/05/2018 CLINICAL DATA:  Paralytic ileus large intestine EXAM: ABDOMEN - 2 VIEW COMPARISON:  07/04/2018 FINDINGS: Progressive colonic dilatation which is now severe. Negative for free air. Scoliosis and vertebroplasty L4 IMPRESSION: Progression of colonic dilatation compatible with ileus. Electronically Signed   By: Franchot Gallo M.D.   On: 07/05/2018 18:46        Scheduled Meds: . diclofenac sodium  4 g Topical QID  . feeding supplement  1 Container Oral TID BM  . FLUoxetine  40 mg Oral Daily  . furosemide  40  mg Intravenous BID  . lidocaine  1 patch Transdermal Daily  . mirtazapine  15 mg Oral QHS  . mometasone-formoterol  2 puff Inhalation BID  . predniSONE  5 mg Oral Q breakfast  . sodium chloride flush  3 mL Intravenous Q12H  . vancomycin  500 mg Oral Q6H  . vancomycin (VANCOCIN) rectal ENEMA  500 mg Rectal Q6H   Continuous Infusions: . sodium chloride 135 mL/hr at 07/06/18 0400  . sodium chloride       LOS: 2 days    Time spent: 30 minutes    Amily Depp Darleen Crocker, DO [facility] Pager [phone]  If 7PM-7AM, please contact night-coverage [email or web address] Password [email or web address] 07/06/2018, 12:52 PM

## 2018-07-06 NOTE — Interval H&P Note (Signed)
History and Physical Interval Note:  07/06/2018 11:13 AM  Jordan [name]  has presented today for surgery, with the diagnosis of colonic ileus, progressive  The various methods of treatment have been discussed with the patient and family. After consideration of risks, benefits and other options for treatment, the patient has consented to  Procedure(s) with comments: FLEXIBLE SIGMOIDOSCOPY WITH PROPOFOL (N/A) - needs to be done first before the other inpatient  as a surgical intervention .  The patient's history has been reviewed, patient examined, no change in status, stable for surgery.  I have reviewed the patient's chart and labs.  Questions were answered to the patient's satisfaction.     [employer]

## 2018-07-07 DIAGNOSIS — K5939 Other megacolon: Secondary | ICD-10-CM

## 2018-07-07 DIAGNOSIS — A0472 Enterocolitis due to Clostridium difficile, not specified as recurrent: Secondary | ICD-10-CM

## 2018-07-07 DIAGNOSIS — A09 Infectious gastroenteritis and colitis, unspecified: Secondary | ICD-10-CM

## 2018-07-07 LAB — RENAL FUNCTION PANEL
Albumin: 1.9 g/dL — ABNORMAL LOW (ref 3.5–5.0)
Anion gap: 8 (ref 5–15)
BUN: 69 mg/dL — ABNORMAL HIGH (ref 8–23)
CO2: 17 mmol/L — ABNORMAL LOW (ref 22–32)
Calcium: 7.5 mg/dL — ABNORMAL LOW (ref 8.9–10.3)
Chloride: 116 mmol/L — ABNORMAL HIGH (ref 98–111)
Creatinine, Ser: 3.14 mg/dL — ABNORMAL HIGH (ref 0.44–1.00)
GFR calc Af Amer: 16 mL/min — ABNORMAL LOW (ref >60)
GFR calc non Af Amer: 14 mL/min — ABNORMAL LOW (ref >60)
Glucose, Bld: 114 mg/dL — ABNORMAL HIGH (ref 70–99)
Phosphorus: 2.1 mg/dL — ABNORMAL LOW (ref 2.5–4.6)
Potassium: 3.5 mmol/L (ref 3.5–5.1)
Sodium: 141 mmol/L (ref 135–145)

## 2018-07-07 LAB — CBC
HCT: 31.8 % — ABNORMAL LOW (ref 36.0–46.0)
Hemoglobin: 9.6 g/dL — ABNORMAL LOW (ref 12.0–15.0)
MCH: 31 pg (ref 26.0–34.0)
MCHC: 30.2 g/dL (ref 30.0–36.0)
MCV: 102.6 fL — ABNORMAL HIGH (ref 80.0–100.0)
Platelets: 249 10*3/uL (ref 150–400)
RBC: 3.1 MIL/uL — ABNORMAL LOW (ref 3.87–5.11)
RDW: 13.8 % (ref 11.5–15.5)
WBC: 4.3 10*3/uL (ref 4.0–10.5)
nRBC: 0 % (ref 0.0–0.2)

## 2018-07-07 MED ORDER — METRONIDAZOLE IN NACL 5-0.79 MG/ML-% IV SOLN
500.0000 mg | Freq: Three times a day (TID) | INTRAVENOUS | Status: DC
  Administered 2018-07-07 – 2018-07-14 (×22): 500 mg via INTRAVENOUS
  Filled 2018-07-07 (×22): qty 100

## 2018-07-07 MED ORDER — POTASSIUM CHLORIDE CRYS ER 20 MEQ PO TBCR
40.0000 meq | EXTENDED_RELEASE_TABLET | Freq: Every day | ORAL | Status: DC
  Administered 2018-07-07 – 2018-07-09 (×3): 40 meq via ORAL
  Filled 2018-07-07 (×5): qty 2

## 2018-07-07 NOTE — Consult Note (Signed)
Reason for Consult: Dilated colon, pseudomembranous colitis Referring Physician: Dr. Elesa Massed is an 72 y.o. female.  HPI: Patient is a 72 year old white female with multiple medical problems and with a history of C. difficile colitis who presented to the emergency room with ongoing nausea, abdominal distention, poor appetite.  She had been recently restarted on antibiotics for her possible history of C. difficile colitis.  She was noted to be significantly distended.  Initial x-rays show her cecum to be dilated to around 14 cm.  Patient is a poor historian.  She underwent partial endoscopic decompression yesterday.  Surgery is been asked to follow her just in case she develops an acute abdomen.  She has been put on both oral and rectal vancomycin.  Past Medical History:  Diagnosis Date  . Allergy    seasonal  . Anemia   . Arthritis   . Asthma    uses Advair and Spiriva daily,Albuterol prn.Takes Singulair at bedtime and Prednisone daily  . Back spasm    takes Zanaflex daily as needed  . Blood transfusion 2007   no abnormal reation noted   . Bruises easily    d/t being on Prednisone  . Bursitis    left elbow  . C. difficile colitis    2007: treated with Flagyl and Vanc, 2014: treated with vanc, Aug 2014: Vanc taper  . Chronic back pain   . Chronic kidney disease    "creatine" creeping up - followed at this time by PCP  . Complication of anesthesia 2000   woke up during one surgery- ovary surgery  . COPD (chronic obstructive pulmonary disease) ([place])   . Depression    takes Prozac daily  . Dysphasia   . Falls   . H/O hiatal hernia   . Hemorrhoids   . History of bronchitis    last time a yr ago  . History of colon polyps   . History of kidney stones   . History of kidney stones   . Hyperlipidemia    hx of-was on meds but has been off x 1 1/2 yrs  . Hypertension    takes Verapamil nightly  . IBS (irritable bowel syndrome)   . Insomnia    takes Benadryl as needed   . 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 Neurogenic bowel   . Neuromuscular dysfunction of bladder, unspecified   . Nocturia   . Osteoporosis    takes Fosamax weekly  . Pneumonia    MRSA pneumonia in 2007  . Shortness of breath    with exertion daily  . Stroke [facility])    "they say I has a small stroke".No deficits  . Tingling    and pain in left leg  . Urinary frequency     Past Surgical History:  Procedure Laterality Date  . APPENDECTOMY    . BIOPSY  04/13/2017   Procedure: BIOPSY;  Surgeon: Daneil Dolin, MD;  [facility];  Service: Endoscopy;;  right and left colon   . CARPAL TUNNEL RELEASE Left   . CARPAL TUNNEL RELEASE Right 09/21/2015   Procedure: CARPAL TUNNEL RELEASE;  Surgeon: Carole Civil, MD;  Location: [facility] ORS;  Service: Orthopedics;  Laterality: Right;  . cataract surgery Bilateral   . CHOLECYSTECTOMY    . COLONOSCOPY   09/24/2006   RMR: Normal rectum, left-sided diverticula  . COLONOSCOPY  Oct 2012   Dr. Henrene Pastor: tubular adenomas, moderative diverticulosis, internal hemorrhoids  . COLONOSCOPY  WITH PROPOFOL N/A 04/13/2017   Dr. Gala Romney: internal hemorrhoids, diverticulosis  . CYSTOSCOPY    . ENDARTERECTOMY Right 12/12/2015   Procedure: ENDARTERECTOMY RIGHT CAROTID, RESECTION OF REDUNDANT RIGHT COMMON CAROTID ARTERY WITH PRIMARY REANASTOMOSIS;  Surgeon: Mal Misty, MD;  Location: [place];  Service: Vascular;  Laterality: Right;  . ESOPHAGOGASTRODUODENOSCOPY  04/07/2006   [id number] esophagus s/p placement of Bravo pH probe, some surgical changes but not typical of fundoplication wrap, may have slipped  . GANGLION CYST EXCISION Right 09/21/2015   Procedure: RIGHT WRIST GANGLION CYST REMOVAL;  Surgeon: Carole Civil, MD;  Location: [facility];  Service: Orthopedics;  Laterality: Right;  . hemorrhoidal banding    . HERNIA REPAIR     umbilical  . KNEE ARTHROSCOPY Right   . KYPHOPLASTY N/A 02/23/2018   Procedure: KYPHOPLASTY  LUMBAR FOUR;  Surgeon: Newman Pies, MD;  Location: [place];  Service: Neurosurgery;  Laterality: N/A;  . LUMBAR LAMINECTOMY/DECOMPRESSION MICRODISCECTOMY Left 10/17/2013   Procedure: LUMBAR LAMINECTOMY/DECOMPRESSION MICRODISCECTOMY 1 LEVEL three/four;  Surgeon: Ophelia Charter, MD;  Location: [place] [facility];  Service: Neurosurgery;  Laterality: Left;  . NECK SURGERY     fusion-   . NISSEN FUNDOPLICATION     x 2  . OLECRANON BURSECTOMY Left 01/11/2014   Procedure: OLECRANON BURSECTOMY;  Surgeon: Carole Civil, MD;  Location[facility];  Service: Orthopedics;  Laterality: Left;  . OOPHORECTOMY Right   . PATCH ANGIOPLASTY Right 12/12/2015   Procedure: PATCH ANGIOPLASTY RIGHT CAROTID ARTERY USING HEMASHIELD PLATINUM FINESSE PATCH;  Surgeon: Mal Misty, MD;  Location: [place];  Service: Vascular;  Laterality: Right;  . TONSILLECTOMY      Family History  Problem Relation Age of Onset  . Heart disease Father   . Colon cancer Neg Hx     Social History:  reports that she has never smoked. She has never used smokeless tobacco. She reports that she drank about 10.0 standard drinks of alcohol per week. She reports that she does not use drugs.  Allergies:  Allergies  Allergen Reactions  . Cardura [Doxazosin Mesylate] Hives  . Barbiturates Nausea And Vomiting and Rash  . Levofloxacin Rash  . Sulfa Antibiotics Nausea Only and Rash    Medications: I have reviewed the patient's current medications.  Results for orders placed or performed during the hospital encounter of 07/04/18 (from the past 48 hour(s))  Renal function panel     Status: Abnormal   Collection Time: 07/06/18  5:11 AM  Result Value Ref Range   Sodium 138 135 - 145 mmol/L   Potassium 4.1 3.5 - 5.1 mmol/L   Chloride 116 (H) 98 - 111 mmol/L   CO2 15 (L) 22 - 32 mmol/L   Glucose, Bld 116 (H) 70 - 99 mg/dL   BUN 79 (H) 8 - 23 mg/dL   Creatinine, Ser 3.91 (H) 0.44 - 1.00 mg/dL   Calcium 7.3 (L) 8.9 - 10.3 mg/dL   Phosphorus  2.9 2.5 - 4.6 mg/dL   Albumin 1.9 (L) 3.5 - 5.0 g/dL   GFR calc non Af Amer 11 (L) >60 mL/min   GFR calc Af Amer 12 (L) >60 mL/min    Comment: (NOTE) The eGFR has been calculated using the CKD EPI equation. This calculation has not been validated in all clinical situations. eGFR's persistently <60 mL/min signify possible Chronic Kidney Disease.    Anion gap 7 5 - 15    Comment: Performed at [facility], [address] [place], [place]  [address]  Renal function panel     Status: Abnormal   Collection Time: 07/07/18  5:47 AM  Result Value Ref Range   Sodium 141 135 - 145 mmol/L   Potassium 3.5 3.5 - 5.1 mmol/L   Chloride 116 (H) 98 - 111 mmol/L   CO2 17 (L) 22 - 32 mmol/L   Glucose, Bld 114 (H) 70 - 99 mg/dL   BUN 69 (H) 8 - 23 mg/dL   Creatinine, Ser 3.14 (H) 0.44 - 1.00 mg/dL   Calcium 7.5 (L) 8.9 - 10.3 mg/dL   Phosphorus 2.1 (L) 2.5 - 4.6 mg/dL   Albumin 1.9 (L) 3.5 - 5.0 g/dL   GFR calc non Af Amer 14 (L) >60 mL/min   GFR calc Af Amer 16 (L) >60 mL/min    Comment: (NOTE) The eGFR has been calculated using the CKD EPI equation. This calculation has not been validated in all clinical situations. eGFR's persistently <60 mL/min signify possible Chronic Kidney Disease.    Anion gap 8 5 - 15    Comment: Performed at [facility], [address] [place], [place] [address]  CBC     Status: Abnormal   Collection Time: 07/07/18  5:47 AM  Result Value Ref Range   WBC 4.3 4.0 - 10.5 K/uL   RBC 3.10 (L) 3.87 - 5.11 MIL/uL   Hemoglobin 9.6 (L) 12.0 - 15.0 g/dL   HCT 31.8 (L) 36.0 - 46.0 %   MCV 102.6 (H) 80.0 - 100.0 fL   MCH 31.0 26.0 - 34.0 pg   MCHC 30.2 30.0 - 36.0 g/dL   RDW 13.8 11.5 - 15.5 %   Platelets 249 150 - 400 K/uL   nRBC 0.0 0.0 - 0.2 %    Comment: Performed at [facility], [address] [place], [place] [address]    Dg Abd 1 View  Result Date: 07/06/2018 CLINICAL DATA:  72 year old female with ileus. Abdominal pain. History kidney stones. Subsequent  encounter. EXAM: ABDOMEN - 1 VIEW COMPARISON:  07/05/2018 plain film examination.  07/04/2018 CT. FINDINGS: Marked gas distended small and large bowel with cecum displaced medially and superiorly. Degree of distention has progressed since prior plain film examination with cecum now spanning over 15 cm versus prior 14 cm. This may represent colonic ileus as previously noted. Underlying inflammatory process or obstructing lesion were felt to be less likely considerations on prior CT. These possibilities cannot be adequately assessed on plain film exam. The possibility of free intraperitoneal air cannot be assessed on a supine view. Prior cement augmentation L4. Scoliosis lumbar spine with superimposed degenerative changes. IMPRESSION: Progressive gas distended bowel. The medially and superiorly displaced distended cecum now spans over 15 cm versus prior 14 cm. Please see above. These results will be called to the ordering clinician or representative by the Radiologist Assistant, and communication documented in the PACS or zVision Dashboard. Electronically Signed   By: Genia Del M.D.   On: 07/06/2018 09:02   Dg Abd 2 Views  Result Date: 07/05/2018 CLINICAL DATA:  Paralytic ileus large intestine EXAM: ABDOMEN - 2 VIEW COMPARISON:  07/04/2018 FINDINGS: Progressive colonic dilatation which is now severe. Negative for free air. Scoliosis and vertebroplasty L4 IMPRESSION: Progression of colonic dilatation compatible with ileus. Electronically Signed   By: Franchot Gallo M.D.   On: 07/05/2018 18:46    ROS:  Review of systems not obtained due to patient factors.  Blood pressure (!) 151/75, pulse 79, temperature 98.5 F (36.9 C), temperature source Oral,  resp. rate 18, height _0  (1.575 m), weight 49.9 kg, SpO2 93 %. Physical Exam: Somewhat confused white female in no acute distress.  She does appear fatigued. Head is normocephalic, atraumatic Lungs clear to auscultation with equal breath sounds  bilaterally Heart examination reveals a regular rate and rhythm without S3, S4, murmurs Abdomen is distended but not tense.  No tenderness is elicited to palpation.  No rigidity is noted.  Occasional bowel sounds are appreciated.  Previous x-ray images personally reviewed Assessment/Plan: Impression: Megacolon secondary to apparent C. difficile colitis.  No peritoneal signs noted at this time. Plan: As the patient was just started on extensive vancomycin therapy and clinically she has not deteriorated, no need for acute surgical intervention at this time.  She is a high risk surgical candidate given her multiple medical problems.  Will follow with you.  Aviva Signs 07/07/2018, 10:45 AM

## 2018-07-07 NOTE — Progress Notes (Signed)
Jordan [name]  MRN: [id number]  DOB/AGE: 1946/08/22 72 y.o.  Primary Care Physician:Hawkins, Percell Miller, MD  Admit date: 07/04/2018  Chief Complaint:  Chief Complaint  Patient presents with  . Abdominal Pain    S-Pt presented on  07/04/2018 with  Chief Complaint  Patient presents with  . Abdominal Pain  .    Pt offers no new concerns.   Meds . diclofenac sodium  4 g Topical QID  . feeding supplement  1 Container Oral TID BM  . FLUoxetine  40 mg Oral Daily  . furosemide  40 mg Intravenous BID  . heparin injection (subcutaneous)  5,000 Units Subcutaneous Q8H  . lidocaine  1 patch Transdermal Daily  . mirtazapine  15 mg Oral QHS  . mometasone-formoterol  2 puff Inhalation BID  . potassium chloride  40 mEq Oral Daily  . predniSONE  5 mg Oral Q breakfast  . sodium chloride flush  3 mL Intravenous Q12H  . vancomycin  500 mg Oral Q6H  . vancomycin (VANCOCIN) rectal ENEMA  500 mg Rectal Q6H      Physical Exam: Vital signs in last 24 hours: Temp:  [98.1 F (36.7 C)-98.6 F (37 C)] 98.5 F (36.9 C) (10/30 0537) Pulse Rate:  [79-101] 79 (10/30 0537) Resp:  [17-25] 18 (10/30 0537) BP: (104-151)/(56-75) 151/75 (10/30 0537) SpO2:  [93 %-100 %] 93 % (10/30 0827) Weight change:  Last BM Date: 06/30/18  Intake/Output from previous day: 10/29 0701 - 10/30 0700 In: 3705 [P.O.:600; I.V.:3105] Out: 2400 [Urine:2400] No intake/output data recorded.   Physical Exam: General- pt is awake,follows commands. Resp- No acute REsp distress,  NO Rhonchi CVS- S1S2 regular in rate and rhythm GIT- BS+,distended EXT- NO LE Edema, NO Cyanosis   Lab Results: CBC Recent Labs    07/05/18 0635 07/07/18 0547  WBC 6.5 4.3  HGB 10.3* 9.6*  HCT 34.5* 31.8*  PLT 237 249    BMET Recent Labs    07/06/18 0511 07/07/18 0547  NA 138 141  K 4.1 3.5  CL 116* 116*  CO2 15* 17*  GLUCOSE 116* 114*  BUN 79* 69*  CREATININE 3.91* 3.14*  CALCIUM 7.3* 7.5*   Creat trend 2019    4.0=>4.2=>3.9=>3.1 2018   1.4--1.6 2017   1.5--1.6 2016   1.2 2015   1.2   MICRO Recent Results (from the past 240 hour(s))  MRSA PCR Screening     Status: None   Collection Time: 07/04/18  5:25 PM  Result Value Ref Range Status   MRSA by PCR NEGATIVE NEGATIVE Final    Comment:        The GeneXpert MRSA Assay (FDA approved for NASAL specimens only), is one component of a comprehensive MRSA colonization surveillance program. It is not intended to diagnose MRSA infection nor to guide or monitor treatment for MRSA infections. Performed at [facility], [address] [place], [place] [address]       Lab Results  Component Value Date   CALCIUM 7.5 (L) 07/07/2018   CAION 1.17 05/05/2017   PHOS 2.1 (L) 07/07/2018  Alb 1.9 Corrected calcium 7.5+1.7=9.20             Impression: 1)Renal  AKI secondary to ATN                AKI sec to Hypovolemia                AKI on CKD  CKD stage 3.               CKD since 2015               CKD secondary to HTN                Progression of CKD marked with AKI                Proteinura Absent .                2)HTN  Medication- On Diuretics  3)Anemia HGb at goal (9--11)   4)CKD Mineral-Bone Disorder Phosphorus not at goal.     On feeds Calcium when corrected for low albumin is at goal.  5)ID-admitted with Cdiff Primary MD following  6)Electrolytes Normokalemic now   Was low earlier  NOrmonatremic   7)Acid base Co2 not at goal But better    Plan:   Will continue current care   Monroe S 07/07/2018, 10:47 AM

## 2018-07-07 NOTE — Progress Notes (Signed)
PROGRESS NOTE    Jordan [name]  CHE:[id number] DOB: 1946-06-17 DOA: 07/04/2018 PCP: Sinda Du, MD   Brief Narrative:   Per HPI: Jordan [name] a 72 y.o.femalewith medical history significant forlumbar compression fracture, neuromuscular dysfunction of bladder and colon, CKD stage III, hypertension, dyslipidemia, IBS, COPD, and questionable recent history of C. difficile colitis with initiation of Flagyl approximately 3 to 4 days ago, who presented to the ED with worsening abdominal distention and apparently no bowel movement or urination since Friday of last week. According to SNF notes, she was started on Flagyl for C. difficile on 10/24 at the facility. The patient is overall a poor historian, but appears to state that she did not have any significant diarrhea or abdominal pain at that time. She is noted to have some ongoing nausea but no vomiting and has maintained a poor appetite with poor oral intake. She states that she has been drinking plenty of water, but continues to remain very thirsty. She denies any bloody bowel movements, fever, chills, chest pain, or shortness of breath.  She has been admitted with adynamic colonic ileus in the setting of questionable recent C. difficile infection. She is also noted to have severe AKI on CKD stage III with oliguria. Additionally, she has severe hypokalemia. She has been placed on IV fluid with improving creatinine noted.  She is undergone flexible sigmoidoscopy on 10/29 with noted pseudomembranous colitis.  She has been started on increased dose of oral vancomycin as well as per rectal vancomycin.  IV Flagyl added on 10/30 and general surgery consulted for evaluation.   Assessment & Plan:  Principal Problem: Ileus ([place]) Active Problems: Essential hypertension COPD (chronic obstructive pulmonary disease) (HCC) Lumbar compression fracture (HCC) AKI (acute kidney injury) (HCC) Hypokalemia   1. Adynamic  colonic ileus in the setting pseudomembranous enterocolitis with megacolon secondary to C. difficile. Appreciate GI and general surgery recommendations.  Continue oral vancomycin and vancomycin enemas with the addition of IV Flagyl today.  Up to chair and encourage some ambulation. 2. AKI on CKD stage III with oliguria-improving. Maintain on IV fluid hydration and avoid nephrotoxic agents. Monitor strict I's and O's with Foley catheter placement due to patient condition and previously noted bladder neurological dysfunction. Appreciate nephrology recommendations with addition of Lasix twice daily on 10/29. 3. Severe hypokalemia-resolved. EKG with no significant changes,however with Lasix use potassium has been starting to downtrend and will supplement orally on a daily basis and follow on repeat BMP.  Check magnesium in a.m. 4. Essential hypertension. In the face of soft blood pressure readings currently we will hold lisinopril. Continue on gentle IV fluid and monitor carefully. 5. History of COPD. Maintain on Dulera while inpatient and continue Singulair as well as chronic prednisone 5 mg daily.   DVT prophylaxis:Heparin Code Status:Full Family Communication:None at bedside Disposition Plan:Appreciate GI nephrology evaluation for treatment of AKI and ileus. Continue IV fluid and n.p.o. status for now.   Consultants:  GI  Nephrology  General surgery  Procedures:  Flex sigmoidoscopy on 10/29 per GI  Antimicrobials:   Oral vancomycin 10/28->  Rectal vancomycin 10/29->  IV Flagyl 10/30->  Subjective: Patient seen and evaluated today with no new acute complaints or concerns. No acute concerns or events noted overnight.  She denies any abdominal pain this morning and has been tolerating a clear liquid diet with no nausea or bowel movements noted overnight.  She has not been sleeping too well.  Objective: Vitals:   07/06/18 2016 07/06/18 2043 07/07/18  0537  07/07/18 0827  BP:  126/61 (!) 151/75   Pulse:  87 79   Resp:  20 18   Temp:  98.6 F (37 C) 98.5 F (36.9 C)   TempSrc:  Oral Oral   SpO2: 95% 98% 96% 93%  Weight:      Height:        Intake/Output Summary (Last 24 hours) at 07/07/2018 0954 Last data filed at 07/07/2018 0300 Gross per 24 hour  Intake 3705 ml  Output 2400 ml  Net 1305 ml   Filed Weights   07/04/18 0726  Weight: 49.9 kg    Examination:  General exam: Appears tired Respiratory system: Clear to auscultation. Respiratory effort normal. Cardiovascular system: S1 & S2 heard, RRR. No JVD, murmurs, rubs, gallops or clicks. No pedal edema. Gastrointestinal system: Abdomen is moderately distended, but less tense than on admission, soft and nontender. No organomegaly or masses felt. Normal bowel sounds heard. Central nervous system: Alert and rest of exam cannot be performed. Extremities: Symmetric 5 x 5 power. Skin: No rashes, lesions or ulcers Psychiatry: Flat affect.    Data Reviewed: I have personally reviewed following labs and imaging studies  CBC: Recent Labs  Lab 07/04/18 0729 07/05/18 0635 07/07/18 0547  WBC 9.5 6.5 4.3  HGB 11.6* 10.3* 9.6*  HCT 37.4 34.5* 31.8*  MCV 100.3* 102.4* 102.6*  PLT 277 237 409   Basic Metabolic Panel: Recent Labs  Lab 07/04/18 0729 07/05/18 0635 07/06/18 0511 07/07/18 0547  NA 135 136 138 141  K 2.8* 3.5 4.1 3.5  CL 105 111 116* 116*  CO2 17* 14* 15* 17*  GLUCOSE 165* 106* 116* 114*  BUN 76* 77* 79* 69*  CREATININE 4.05* 4.24* 3.91* 3.14*  CALCIUM 7.8* 7.3* 7.3* 7.5*  MG 3.3*  --   --   --   PHOS  --   --  2.9 2.1*   GFR: Estimated Creatinine Clearance: 12.8 mL/min (A) (by C-G formula based on SCr of 3.14 mg/dL (H)). Liver Function Tests: Recent Labs  Lab 07/04/18 0729 07/06/18 0511 07/07/18 0547  AST 12*  --   --   ALT 15  --   --   ALKPHOS 69  --   --   BILITOT 0.7  --   --   PROT 6.2*  --   --   ALBUMIN 2.5* 1.9* 1.9*   Recent Labs    Lab 07/04/18 0729  LIPASE 24   No results for input(s): AMMONIA in the last 168 hours. Coagulation Profile: No results for input(s): INR, PROTIME in the last 168 hours. Cardiac Enzymes: No results for input(s): CKTOTAL, CKMB, CKMBINDEX, TROPONINI in the last 168 hours. BNP (last 3 results) No results for input(s): PROBNP in the last 8760 hours. HbA1C: No results for input(s): HGBA1C in the last 72 hours. CBG: No results for input(s): GLUCAP in the last 168 hours. Lipid Profile: No results for input(s): CHOL, HDL, LDLCALC, TRIG, CHOLHDL, LDLDIRECT in the last 72 hours. Thyroid Function Tests: No results for input(s): TSH, T4TOTAL, FREET4, T3FREE, THYROIDAB in the last 72 hours. Anemia Panel: No results for input(s): VITAMINB12, FOLATE, FERRITIN, TIBC, IRON, RETICCTPCT in the last 72 hours. Sepsis Labs: No results for input(s): PROCALCITON, LATICACIDVEN in the last 168 hours.  Recent Results (from the past 240 hour(s))  MRSA PCR Screening     Status: None   Collection Time: 07/04/18  5:25 PM  Result Value Ref Range Status   MRSA by PCR NEGATIVE NEGATIVE  Final    Comment:        The GeneXpert MRSA Assay (FDA approved for NASAL specimens only), is one component of a comprehensive MRSA colonization surveillance program. It is not intended to diagnose MRSA infection nor to guide or monitor treatment for MRSA infections. Performed at [facility], [address] [place], [place] [address]          Radiology Studies: Dg Abd 1 View  Result Date: 07/06/2018 CLINICAL DATA:  72 year old female with ileus. Abdominal pain. History kidney stones. Subsequent encounter. EXAM: ABDOMEN - 1 VIEW COMPARISON:  07/05/2018 plain film examination.  07/04/2018 CT. FINDINGS: Marked gas distended small and large bowel with cecum displaced medially and superiorly. Degree of distention has progressed since prior plain film examination with cecum now spanning over 15 cm versus prior 14 cm. This  may represent colonic ileus as previously noted. Underlying inflammatory process or obstructing lesion were felt to be less likely considerations on prior CT. These possibilities cannot be adequately assessed on plain film exam. The possibility of free intraperitoneal air cannot be assessed on a supine view. Prior cement augmentation L4. Scoliosis lumbar spine with superimposed degenerative changes. IMPRESSION: Progressive gas distended bowel. The medially and superiorly displaced distended cecum now spans over 15 cm versus prior 14 cm. Please see above. These results will be called to the ordering clinician or representative by the Radiologist Assistant, and communication documented in the PACS or zVision Dashboard. Electronically Signed   By: Genia Del M.D.   On: 07/06/2018 09:02   Dg Abd 2 Views  Result Date: 07/05/2018 CLINICAL DATA:  Paralytic ileus large intestine EXAM: ABDOMEN - 2 VIEW COMPARISON:  07/04/2018 FINDINGS: Progressive colonic dilatation which is now severe. Negative for free air. Scoliosis and vertebroplasty L4 IMPRESSION: Progression of colonic dilatation compatible with ileus. Electronically Signed   By: Franchot Gallo M.D.   On: 07/05/2018 18:46        Scheduled Meds: . diclofenac sodium  4 g Topical QID  . feeding supplement  1 Container Oral TID BM  . FLUoxetine  40 mg Oral Daily  . furosemide  40 mg Intravenous BID  . heparin injection (subcutaneous)  5,000 Units Subcutaneous Q8H  . lidocaine  1 patch Transdermal Daily  . mirtazapine  15 mg Oral QHS  . mometasone-formoterol  2 puff Inhalation BID  . predniSONE  5 mg Oral Q breakfast  . sodium chloride flush  3 mL Intravenous Q12H  . vancomycin  500 mg Oral Q6H  . vancomycin (VANCOCIN) rectal ENEMA  500 mg Rectal Q6H   Continuous Infusions: . sodium chloride 135 mL/hr at 07/07/18 0438  . sodium chloride    . metronidazole       LOS: 3 days    Time spent: 30 minutes    Ellenor Wisniewski Darleen Crocker, DO [facility] Pager [phone]  If 7PM-7AM, please contact night-coverage [email or web address] Password [email or web address] 07/07/2018, 9:54 AM

## 2018-07-07 NOTE — Progress Notes (Addendum)
Subjective:  States she is very tired. Slept ok. Tolerating enemas. Stomach a little better. Denies abdominal pain. No vomiting.   Objective: Vital signs in last 24 hours: Temp:  [98.1 F (36.7 C)-98.6 F (37 C)] 98.5 F (36.9 C) (10/30 0537) Pulse Rate:  [79-101] 79 (10/30 0537) Resp:  [17-25] 18 (10/30 0537) BP: (104-151)/(56-75) 151/75 (10/30 0537) SpO2:  [95 %-100 %] 96 % (10/30 0537) Last BM Date: 10/23/Jordan General:   Drowsy but easily arousable, thin,  cooperative in NAD Head:  Normocephalic and atraumatic. Eyes:  Sclera clear, no icterus.  Abdomen:  Marked distention, hypoactive tympanic bowel sounds, nontender without guarding, and without rebound.   Extremities:  Without clubbing, deformity or edema. Neurologic:  Drowsy but easily arousable.  oriented x4;  grossly normal neurologically. Skin:  Intact without significant lesions or rashes. Psych:  Alert and cooperative. Normal mood and affect.  Intake/Output from previous day: 10/29 0701 - 10/30 0700 In: 3705 [P.O.:600; I.V.:3105] Out: 2400 [Urine:2400] Intake/Output this shift: No intake/output data recorded.  Lab Results: CBC Recent Labs    10/28/Jordan 0635 10/30/Jordan 0547  WBC 6.5 4.3  HGB 10.3* 9.6*  HCT 34.5* 31.8*  MCV 102.4* 102.6*  PLT 237 249   BMET Recent Labs    10/28/Jordan 0635 10/29/Jordan 0511 10/30/Jordan 0547  NA 136 138 141  K 3.5 4.1 3.5  CL 111 116* 116*  CO2 14* 15* 17*  GLUCOSE 106* 116* 114*  BUN 77* 79* 69*  CREATININE 4.24* 3.91* 3.14*  CALCIUM 7.3* 7.3* 7.5*   LFTs Recent Labs    10/29/Jordan 0511 10/30/Jordan 0547  ALBUMIN 1.9* 1.9*   No results for input(s): LIPASE in the last 72 hours. PT/INR No results for input(s): LABPROT, INR in the last 72 hours.    Imaging Studies: Ct Abdomen Pelvis Wo Contrast  Result Date: 07/04/2018 CLINICAL DATA:  Abdominal distension, nausea, on Flagyl for C diff EXAM: CT ABDOMEN AND PELVIS WITHOUT CONTRAST TECHNIQUE: Multidetector CT imaging of the abdomen  and pelvis was performed following the standard protocol without IV contrast. COMPARISON:  05/05/2017 FINDINGS: Lower chest: Small bilateral pleural effusions. Hepatobiliary: Unenhanced liver is unremarkable. Status post cholecystectomy. No intrahepatic or extrahepatic ductal dilatation. Pancreas: Within normal limits. Spleen: Within normal limits. Adrenals/Urinary Tract: Adrenal glands within normal limits. Right renal cortical atrophy/scarring. 8 mm nonobstructing right lower pole renal calculus (series 2/image 29). No hydronephrosis. Bladder is mildly thick-walled although underdistended. Stomach/Bowel: Stomach is notable for a small hiatal hernia. No evidence of small bowel obstruction. Diffuse colonic dilatation, measuring up to 9.4 cm in the cecum, which is mobile and located in the left mid abdomen (series 2/image 44). However, there is no significant rectum may be mildly thick-walled (series 2/image 77), but this is likely exacerbated by underdistention. No pneumatosis or free air. Vascular/Lymphatic: No evidence of abdominal aortic aneurysm. Atherosclerotic calcifications of the abdominal aorta and branch vessels. No suspicious abdominopelvic lymphadenopathy. Reproductive: Uterus is unremarkable. No adnexal masses. Other: Small volume perihepatic ascites. Trace pelvic ascites. Musculoskeletal: Prior vertebral augmentation at L4. Moderate compression fracture deformity at T12. Mild compression fracture deformity at L1. These findings are unchanged. Mild degenerative changes of the lumbar spine with dextroscoliosis. IMPRESSION: Diffuse colonic dilatation, without convincing wall thickening or pericolonic inflammatory changes despite the known history of C difficile colitis. As such, the overall appearance favors adynamic/paralytic colonic ileus. No pneumatosis or free air.  No evidence of small bowel obstruction. Small volume abdominopelvic ascites. Small bilateral pleural effusions. Electronically Signed  By: Julian Hy M.D.   On: 07/04/2018 14:23   Dg Abd 1 View  Result Date: 07/06/2018 CLINICAL DATA:  73 year old [name] with ileus. Abdominal pain. History kidney stones. Subsequent encounter. EXAM: ABDOMEN - 1 VIEW COMPARISON:  07/05/2018 plain film examination.  07/04/2018 CT. FINDINGS: Marked gas distended small and large bowel with cecum displaced medially and superiorly. Degree of distention has progressed since prior plain film examination with cecum now spanning over 15 cm versus prior 14 cm. This may represent colonic ileus as previously noted. Underlying inflammatory process or obstructing lesion were felt to be less likely considerations on prior CT. These possibilities cannot be adequately assessed on plain film exam. The possibility of free intraperitoneal air cannot be assessed on a supine view. Prior cement augmentation L4. Scoliosis lumbar spine with superimposed degenerative changes. IMPRESSION: Progressive gas distended bowel. The medially and superiorly displaced distended cecum now spans over 15 cm versus prior 14 cm. Please see above. These results will be called to the ordering clinician or representative by the Radiologist Assistant, and communication documented in the PACS or zVision Dashboard. Electronically Signed   By: Genia Del M.D.   On: 07/06/2018 09:02   Dg Abd 2 Views  Result Date: 07/05/2018 CLINICAL DATA:  Paralytic ileus large intestine EXAM: ABDOMEN - 2 VIEW COMPARISON:  07/04/2018 FINDINGS: Progressive colonic dilatation which is now severe. Negative for free air. Scoliosis and vertebroplasty L4 IMPRESSION: Progression of colonic dilatation compatible with ileus. Electronically Signed   By: Franchot Gallo M.D.   On: 07/05/2018 18:46   Dg Abd Acute W/chest  Result Date: 07/04/2018 CLINICAL DATA:  Abdominal pain, distension EXAM: DG ABDOMEN ACUTE W/ 1V CHEST COMPARISON:  Chest radiograph dated 04/27/2017 FINDINGS: Lungs are clear.  No pleural effusion or  pneumothorax. The heart is normal in size. Colonic distension, suggesting adynamic colonic ileus. No findings to suggest small bowel obstruction. No evidence of free air under the diaphragm on the upright view. Mild degenerative changes of the lumbar spine with lumbar dextroscoliosis. Vertebral augmentation at L4. Cholecystectomy clips. IMPRESSION: No evidence of acute cardiopulmonary disease. Suspected adynamic colonic ileus. No findings to suggest small bowel obstruction or free air. Electronically Signed   By: Julian Hy M.D.   On: 07/04/2018 09:20  [2 weeks]   Assessment:  72 year old [name] admitted with colonic ileus in the setting of C. Difficile. 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  Started on oral vanc 10/28.  Imaging showed progressive gaseous distention, cecum spanning over 15 cm.  Flexible sigmoidoscopy yesterday with pseudomembranous colitis with megacolon noted.  Vancomycin increased to 500 mg 4 times daily, 500mg  q6hr per rectum added.  Surgical consultation requested with Dr. Arnoldo Morale.  Patient remains significantly distended although reports no abdominal pain. She reports slight improvement. She appears very tired today. Will follow closely. Discussed with Dr. Gala Romney.   Plan: 1. Continue current regimen of oral vanc and vanc enemas.  2. Add IV Flagyl 500mg  q 8 hours.   3. Await surgery consultation.   Laureen Ochs. Bernarda Caffey [facility] [phone] 10/30/20199:27 AM     LOS: 3 days

## 2018-07-07 NOTE — Progress Notes (Signed)
Spoke with Dr. Gala Romney who recommends rectal tube to be placed 2 hours after each dose vanc enema for next 48 hours. Each time the rectal tube is placed, it will be left in for 30 minutes, patient to be positioned on her side during this time. Alternated left and right sides each time the rectal tube is placed. I have verbally spoken to the patient's nurse, Izora Gala regarding this order.   Laureen Ochs. Bernarda Caffey [name] [phone] 10/30/20193:42 PM

## 2018-07-08 ENCOUNTER — Encounter (HOSPITAL_COMMUNITY): Payer: Self-pay | Admitting: Gastroenterology

## 2018-07-08 LAB — BASIC METABOLIC PANEL
Anion gap: 7 (ref 5–15)
BUN: 56 mg/dL — ABNORMAL HIGH (ref 8–23)
CO2: 17 mmol/L — ABNORMAL LOW (ref 22–32)
Calcium: 7.5 mg/dL — ABNORMAL LOW (ref 8.9–10.3)
Chloride: 119 mmol/L — ABNORMAL HIGH (ref 98–111)
Creatinine, Ser: 2.59 mg/dL — ABNORMAL HIGH (ref 0.44–1.00)
GFR calc Af Amer: 20 mL/min — ABNORMAL LOW (ref >60)
GFR calc non Af Amer: 17 mL/min — ABNORMAL LOW (ref >60)
Glucose, Bld: 104 mg/dL — ABNORMAL HIGH (ref 70–99)
Potassium: 3.8 mmol/L (ref 3.5–5.1)
Sodium: 143 mmol/L (ref 135–145)

## 2018-07-08 LAB — CBC
HCT: 33.3 % — ABNORMAL LOW (ref 36.0–46.0)
Hemoglobin: 10 g/dL — ABNORMAL LOW (ref 12.0–15.0)
MCH: 30.2 pg (ref 26.0–34.0)
MCHC: 30 g/dL (ref 30.0–36.0)
MCV: 100.6 fL — ABNORMAL HIGH (ref 80.0–100.0)
Platelets: 244 10*3/uL (ref 150–400)
RBC: 3.31 MIL/uL — ABNORMAL LOW (ref 3.87–5.11)
RDW: 13.9 % (ref 11.5–15.5)
WBC: 5 10*3/uL (ref 4.0–10.5)
nRBC: 0 % (ref 0.0–0.2)

## 2018-07-08 LAB — MAGNESIUM: Magnesium: 3 mg/dL — ABNORMAL HIGH (ref 1.7–2.4)

## 2018-07-08 MED ORDER — FAMOTIDINE IN NACL 20-0.9 MG/50ML-% IV SOLN
20.0000 mg | Freq: Two times a day (BID) | INTRAVENOUS | Status: DC
  Administered 2018-07-08: 20 mg via INTRAVENOUS
  Filled 2018-07-08: qty 50

## 2018-07-08 NOTE — Progress Notes (Signed)
Subjective: Interval History: Patient is feeling much better.  She is able to tolerate liquid diet.  She did not have any nausea or vomiting.  Her abdominal pain and distention is better.  Objective: Vital signs in last 24 hours: Temp:  [98.4 F (36.9 C)-100.2 F (37.9 C)] 99.3 F (37.4 C) (10/31 0731) Pulse Rate:  [73-81] 81 (10/31 0731) Resp:  [15-17] 17 (10/31 0731) BP: (114-147)/(71-84) 141/71 (10/31 0731) SpO2:  [98 %-100 %] 100 % (10/31 0806) Weight change:   Intake/Output from previous day: 10/30 0701 - 10/31 0700 In: 3574.9 [I.V.:3102.6; IV Piggyback:472.3] Out: 2850 [Urine:2850] Intake/Output this shift: No intake/output data recorded.  Generally patient is alert and in no apparent distress Chest is clear to auscultation Heart exam revealed regular rate and rhythm Abdomen: Distended, tender and positive bowel sound Extremities she has trace edema  Lab Results: Recent Labs    07/07/18 0547 07/08/18 0447  WBC 4.3 5.0  HGB 9.6* 10.0*  HCT 31.8* 33.3*  PLT 249 244   BMET:  Recent Labs    07/07/18 0547 07/08/18 0447  NA 141 143  K 3.5 3.8  CL 116* 119*  CO2 17* 17*  GLUCOSE 114* 104*  BUN 69* 56*  CREATININE 3.14* 2.59*  CALCIUM 7.5* 7.5*   No results for input(s): PTH in the last 72 hours. Iron Studies: No results for input(s): IRON, TIBC, TRANSFERRIN, FERRITIN in the last 72 hours.  Studies/Results: No results found.  I have reviewed the patient's current medications.  Assessment/Plan: 1] acute kidney injury superimposed on chronic.  Her renal function continue to improve.  Patient is a symptomatic. 2] anemia: Her hemoglobin is within our target goal 3] ileus: Her abdominal distention is getting better.  She is tolerating liquid diet. 4] bone and mineral disorder: Her calcium and phosphorus remains in range 5] fluid management: Presently patient is a symptomatic.  She had 2800 cc of urine output.  She did not have any sign of fluid overload.  She  is on Lasix. 6] hypertension: Her blood pressure is reasonably controlled 7] neurogenic bladder 8] hypokalemia: Her potassium is normal. Plan: 1] we will continue with present treatment. 2] we will check a renal panel in the morning.   LOS: 4 days   Shawnia Vizcarrondo S 07/08/2018,9:30 AM

## 2018-07-08 NOTE — Progress Notes (Signed)
Reported to dr. Myna Hidalgo pt had a small episode of coffee ground emesis. New orders given

## 2018-07-08 NOTE — Progress Notes (Signed)
Subjective:  Feels somewhat better. Maybe little less distended. More alert today. No N/V. No abd pain.   Objective: Vital signs in last 24 hours: Temp:  [98.4 F (36.9 C)-100.2 F (37.9 C)] 100.2 F (37.9 C) (10/30 2238) Pulse Rate:  [73-81] 81 (10/31 0731) Resp:  [15-17] 17 (10/31 0731) BP: (114-147)/(71-84) 141/71 (10/31 0731) SpO2:  [93 %-100 %] 100 % (10/31 0731) Last BM Date: 07/08/18 General:   Alert,  Thin,  pleasant and cooperative in NAD Head:  Normocephalic and atraumatic. Eyes:  Sclera clear, no icterus.  Abdomen: Marked distention, hypoactive tympanic bowel sounds.  Nontender without guarding or rebound.     Extremities:  Without clubbing, deformity or edema. Neurologic:  Alert and  oriented x4;  grossly normal neurologically. Skin:  Intact without significant lesions or rashes. Psych:  Alert and cooperative. Normal mood and affect.  Intake/Output from previous day: 10/30 0701 - 10/31 0700 In: 3574.9 [I.V.:3102.6; IV Piggyback:472.3] Out: 750 [Urine:750] Intake/Output this shift: No intake/output data recorded.  Lab Results: CBC Recent Labs    07/07/18 0547 07/08/18 0447  WBC 4.3 5.0  HGB 9.6* 10.0*  HCT 31.8* 33.3*  MCV 102.6* 100.6*  PLT 249 244   BMET Recent Labs    07/06/18 0511 07/07/18 0547 07/08/18 0447  NA 138 141 143  K 4.1 3.5 3.8  CL 116* 116* 119*  CO2 15* 17* 17*  GLUCOSE 116* 114* 104*  BUN 79* 69* 56*  CREATININE 3.91* 3.14* 2.59*  CALCIUM 7.3* 7.5* 7.5*   LFTs Recent Labs    07/06/18 0511 07/07/18 0547  ALBUMIN 1.9* 1.9*   No results for input(s): LIPASE in the last 72 hours. PT/INR No results for input(s): LABPROT, INR in the last 72 hours.    Imaging Studies: Ct Abdomen Pelvis Wo Contrast  Result Date: 07/04/2018 CLINICAL DATA:  Abdominal distension, nausea, on Flagyl for C diff EXAM: CT ABDOMEN AND PELVIS WITHOUT CONTRAST TECHNIQUE: Multidetector CT imaging of the abdomen and pelvis was performed following the  standard protocol without IV contrast. COMPARISON:  05/05/2017 FINDINGS: Lower chest: Small bilateral pleural effusions. Hepatobiliary: Unenhanced liver is unremarkable. Status post cholecystectomy. No intrahepatic or extrahepatic ductal dilatation. Pancreas: Within normal limits. Spleen: Within normal limits. Adrenals/Urinary Tract: Adrenal glands within normal limits. Right renal cortical atrophy/scarring. 8 mm nonobstructing right lower pole renal calculus (series 2/image 29). No hydronephrosis. Bladder is mildly thick-walled although underdistended. Stomach/Bowel: Stomach is notable for a small hiatal hernia. No evidence of small bowel obstruction. Diffuse colonic dilatation, measuring up to 9.4 cm in the cecum, which is mobile and located in the left mid abdomen (series 2/image 44). However, there is no significant rectum may be mildly thick-walled (series 2/image 77), but this is likely exacerbated by underdistention. No pneumatosis or free air. Vascular/Lymphatic: No evidence of abdominal aortic aneurysm. Atherosclerotic calcifications of the abdominal aorta and branch vessels. No suspicious abdominopelvic lymphadenopathy. Reproductive: Uterus is unremarkable. No adnexal masses. Other: Small volume perihepatic ascites. Trace pelvic ascites. Musculoskeletal: Prior vertebral augmentation at L4. Moderate compression fracture deformity at T12. Mild compression fracture deformity at L1. These findings are unchanged. Mild degenerative changes of the lumbar spine with dextroscoliosis. IMPRESSION: Diffuse colonic dilatation, without convincing wall thickening or pericolonic inflammatory changes despite the known history of C difficile colitis. As such, the overall appearance favors adynamic/paralytic colonic ileus. No pneumatosis or free air.  No evidence of small bowel obstruction. Small volume abdominopelvic ascites. Small bilateral pleural effusions. Electronically Signed   By: Julian Hy  M.D.   On:  07/04/2018 14:23   Dg Abd 1 View  Result Date: 07/06/2018 CLINICAL DATA:  72 year old female with ileus. Abdominal pain. History kidney stones. Subsequent encounter. EXAM: ABDOMEN - 1 VIEW COMPARISON:  07/05/2018 plain film examination.  07/04/2018 CT. FINDINGS: Marked gas distended small and large bowel with cecum displaced medially and superiorly. Degree of distention has progressed since prior plain film examination with cecum now spanning over 15 cm versus prior 14 cm. This may represent colonic ileus as previously noted. Underlying inflammatory process or obstructing lesion were felt to be less likely considerations on prior CT. These possibilities cannot be adequately assessed on plain film exam. The possibility of free intraperitoneal air cannot be assessed on a supine view. Prior cement augmentation L4. Scoliosis lumbar spine with superimposed degenerative changes. IMPRESSION: Progressive gas distended bowel. The medially and superiorly displaced distended cecum now spans over 15 cm versus prior 14 cm. Please see above. These results will be called to the ordering clinician or representative by the Radiologist Assistant, and communication documented in the PACS or zVision Dashboard. Electronically Signed   By: Genia Del M.D.   On: 07/06/2018 09:02   Dg Abd 2 Views  Result Date: 07/05/2018 CLINICAL DATA:  Paralytic ileus large intestine EXAM: ABDOMEN - 2 VIEW COMPARISON:  07/04/2018 FINDINGS: Progressive colonic dilatation which is now severe. Negative for free air. Scoliosis and vertebroplasty L4 IMPRESSION: Progression of colonic dilatation compatible with ileus. Electronically Signed   By: Franchot Gallo M.D.   On: 07/05/2018 18:46   Dg Abd Acute W/chest  Result Date: 07/04/2018 CLINICAL DATA:  Abdominal pain, distension EXAM: DG ABDOMEN ACUTE W/ 1V CHEST COMPARISON:  Chest radiograph dated 04/27/2017 FINDINGS: Lungs are clear.  No pleural effusion or pneumothorax. The heart is normal in  size. Colonic distension, suggesting adynamic colonic ileus. No findings to suggest small bowel obstruction. No evidence of free air under the diaphragm on the upright view. Mild degenerative changes of the lumbar spine with lumbar dextroscoliosis. Vertebral augmentation at L4. Cholecystectomy clips. IMPRESSION: No evidence of acute cardiopulmonary disease. Suspected adynamic colonic ileus. No findings to suggest small bowel obstruction or free air. Electronically Signed   By: Julian Hy M.D.   On: 07/04/2018 09:20  [2 weeks]   Assessment: 72 y/o female admitted with colonic ileus/colonic distention (megacolon) in setting of C.diff (pseudomembranous colitis). General surgery following.  Currently on oral vancomycin, vancomycin enemas, IV Flagyl.  Using rectal tube intermittently for decompression purposes.  Slight improvement in the last 24 hours.  Discussed management with nursing staff, Izora Gala.  Plan: 1. Continue current regimen. 2. Continue to follow closely. 3. Would benefit from increased mobility as tolerated.  Encourage patient to at least rotate from side to side while in bed, sit upright as tolerated.  Laureen Ochs. Bernarda Caffey [facility] [phone] 10/31/20199:01 AM     LOS: 4 days

## 2018-07-08 NOTE — Progress Notes (Signed)
2 Days Post-Op  Subjective: Patient denies any significant abdominal pain.  Objective: Vital signs in last 24 hours: Temp:  [98.4 F (36.9 C)-100.2 F (37.9 C)] 100.2 F (37.9 C) (10/30 2238) Pulse Rate:  [73-81] 81 (10/31 0731) Resp:  [15-17] 17 (10/31 0731) BP: (114-147)/(71-84) 141/71 (10/31 0731) SpO2:  [93 %-100 %] 100 % (10/31 0731) Last BM Date: 07/08/18  Intake/Output from previous day: 10/30 0701 - 10/31 0700 In: 3574.9 [I.V.:3102.6; IV Piggyback:472.3] Out: 750 [Urine:750] Intake/Output this shift: No intake/output data recorded.  General appearance: cooperative and no distress GI: Still distended but soft.  No rigidity is noted.  No tenderness noted.  Bowel sounds present.  Lab Results:  Recent Labs    07/07/18 0547 07/08/18 0447  WBC 4.3 5.0  HGB 9.6* 10.0*  HCT 31.8* 33.3*  PLT 249 244   BMET Recent Labs    07/07/18 0547 07/08/18 0447  NA 141 143  K 3.5 3.8  CL 116* 119*  CO2 17* 17*  GLUCOSE 114* 104*  BUN 69* 56*  CREATININE 3.14* 2.59*  CALCIUM 7.5* 7.5*   PT/INR No results for input(s): LABPROT, INR in the last 72 hours.  Studies/Results: Dg Abd 1 View  Result Date: 07/06/2018 CLINICAL DATA:  72 year old female with ileus. Abdominal pain. History kidney stones. Subsequent encounter. EXAM: ABDOMEN - 1 VIEW COMPARISON:  07/05/2018 plain film examination.  07/04/2018 CT. FINDINGS: Marked gas distended small and large bowel with cecum displaced medially and superiorly. Degree of distention has progressed since prior plain film examination with cecum now spanning over 15 cm versus prior 14 cm. This may represent colonic ileus as previously noted. Underlying inflammatory process or obstructing lesion were felt to be less likely considerations on prior CT. These possibilities cannot be adequately assessed on plain film exam. The possibility of free intraperitoneal air cannot be assessed on a supine view. Prior cement augmentation L4. Scoliosis lumbar  spine with superimposed degenerative changes. IMPRESSION: Progressive gas distended bowel. The medially and superiorly displaced distended cecum now spans over 15 cm versus prior 14 cm. Please see above. These results will be called to the ordering clinician or representative by the Radiologist Assistant, and communication documented in the PACS or zVision Dashboard. Electronically Signed   By: Genia Del M.D.   On: 07/06/2018 09:02    Anti-infectives: Anti-infectives (From admission, onward)   Start     Dose/Rate Route Frequency Ordered Stop   07/07/18 1000  metroNIDAZOLE (FLAGYL) IVPB 500 mg     500 mg 100 mL/hr over 60 Minutes Intravenous Every 8 hours 07/07/18 0928     07/06/18 1800  vancomycin (VANCOCIN) 50 mg/mL oral solution 500 mg     500 mg Oral Every 6 hours 07/06/18 1248     07/06/18 1300  vancomycin (VANCOCIN) 500 mg in sodium chloride irrigation 0.9 % 100 mL ENEMA     500 mg Rectal Every 6 hours 07/06/18 1248 07/20/18 1559   07/05/18 1800  vancomycin (VANCOCIN) 125 MG capsule 125 mg  Status:  Discontinued     125 mg Oral 4 times daily 07/05/18 1659 07/05/18 1702   07/05/18 1800  vancomycin (VANCOCIN) 50 mg/mL oral solution 125 mg  Status:  Discontinued     125 mg Oral Every 6 hours 07/05/18 1702 07/06/18 1249      Assessment/Plan: Impression: Megacolon secondary to pseudomembranous colitis.  No significant change since yesterday.  She does not have peritoneal signs.  We will continue to follow her expectantly.  LOS:  4 days    Aviva Signs 07/08/2018

## 2018-07-08 NOTE — Progress Notes (Signed)
Forwarding to Neil Crouch, PA in Dr. Nona Dell absence.  Please review!

## 2018-07-08 NOTE — Progress Notes (Signed)
PROGRESS NOTE    Jordan KOSLOSKY  DPO:[id number] DOB: [birth year] DOA: 07/04/2018 PCP: Sinda Du, MD   Brief Narrative:   Per HPI: Jordan [name] a 72 y.o.femalewith medical history significant forlumbar compression fracture, neuromuscular dysfunction of bladder and colon, CKD stage III, hypertension, dyslipidemia, IBS, COPD, and questionable recent history of C. difficile colitis with initiation of Flagyl approximately 3 to 4 days ago, who presented to the ED with worsening abdominal distention and apparently no bowel movement or urination since Friday of last week. According to SNF notes, she was started on Flagyl for C. difficile on 10/24 at the facility. The patient is overall a poor historian, but appears to state that she did not have any significant diarrhea or abdominal pain at that time. She is noted to have some ongoing nausea but no vomiting and has maintained a poor appetite with poor oral intake. She states that she has been drinking plenty of water, but continues to remain very thirsty. She denies any bloody bowel movements, fever, chills, chest pain, or shortness of breath.  She has been admitted with adynamic colonic ileus in the setting of questionable recent C. difficile infection. She is also noted to have severe AKI on CKD stage III with oliguria. Additionally, she has severe hypokalemia. She has been placed on IV fluidwith improving creatinine noted. She is undergone flexible sigmoidoscopy on 10/29 with noted pseudomembranous colitis. She has been started on increased dose of oral vancomycin as well as per rectal vancomycin.  IV Flagyl added on 10/30 and general surgery consulted for evaluation with plans to follow for now.   Assessment & Plan:  Principal Problem: Ileus ([place]) Active Problems: Essential hypertension COPD (chronic obstructive pulmonary disease) (HCC) Lumbar compression fracture (HCC) AKI (acute kidney injury)  (HCC) Hypokalemia   1. Adynamic colonic ileus in the setting pseudomembranous enterocolitis with megacolon secondary to C. difficile. Appreciate GI and general surgery recommendations.  Continue oral vancomycin and vancomycin enemas with the addition of IV Flagyl today.  Up to chair and encourage some ambulation. 2. AKI on CKD stage III with oliguria-improving. Maintain on IV fluid hydration and avoid nephrotoxic agents. Monitor strict I's and O's with Foley catheter placement due to patient condition and previously noted bladder neurological dysfunction. Appreciate nephrology recommendations with addition of Lasix twice daily on 10/29. 3. Severe hypokalemia-resolved. EKG with no significant changes,however with Lasix use potassium has been starting to downtrend and will supplement orally on a daily basis and follow on repeat BMP.  Check magnesium in a.m. 4. Essential hypertension. In the face of soft blood pressure readings currently we will hold lisinopril. Continue on gentle IV fluid and monitor carefully. 5. History of COPD. Maintain on Dulera while inpatient and continue Singulair as well as chronic prednisone 5 mg daily.   DVT prophylaxis:Heparin Code Status:Full Family Communication:None at bedside Disposition Plan:Appreciate GI nephrology evaluation for treatment of AKI and ileus. Continue IV fluid and clear liquid diet for now.   Consultants:  GI  Nephrology  General surgery  Procedures:  Flex sigmoidoscopy on 10/29 per GI  Antimicrobials:  Oral vancomycin 10/28->  Rectal vancomycin 10/29->  IV Flagyl 10/30->  Subjective: Patient seen and evaluated today with no new acute complaints or concerns. No acute concerns or events noted overnight.  She appears exhausted and otherwise frustrated.  She denies any significant abdominal pain or nausea.  Objective: Vitals:   07/07/18 2034 07/07/18 2238 07/08/18 0731 07/08/18 0806  BP:  (!) 147/72 (!)  141/71  Pulse:  80 81   Resp:  15 17   Temp:  100.2 F (37.9 C) 99.3 F (37.4 C)   TempSrc:      SpO2: 98% 100% 100% 100%  Weight:      Height:        Intake/Output Summary (Last 24 hours) at 07/08/2018 0901 Last data filed at 07/08/2018 0700 Gross per 24 hour  Intake 3574.93 ml  Output 2850 ml  Net 724.93 ml   Filed Weights   07/04/18 0726  Weight: 49.9 kg    Examination:  General exam: Appears calm and comfortable  Respiratory system: Clear to auscultation. Respiratory effort normal. Cardiovascular system: S1 & S2 heard, RRR. No JVD, murmurs, rubs, gallops or clicks. No pedal edema. Gastrointestinal system: Abdomen is moderately distended, soft and nontender. No organomegaly or masses felt. Normal bowel sounds heard. Central nervous system: Alert and oriented. No focal neurological deficits. Extremities: Symmetric 5 x 5 power. Skin: No rashes, lesions or ulcers Psychiatry: Judgement and insight appear normal. Mood & affect appropriate.     Data Reviewed: I have personally reviewed following labs and imaging studies  CBC: Recent Labs  Lab 07/04/18 0729 07/05/18 0635 07/07/18 0547 07/08/18 0447  WBC 9.5 6.5 4.3 5.0  HGB 11.6* 10.3* 9.6* 10.0*  HCT 37.4 34.5* 31.8* 33.3*  MCV 100.3* 102.4* 102.6* 100.6*  PLT 277 237 249 798   Basic Metabolic Panel: Recent Labs  Lab 07/04/18 0729 07/05/18 0635 07/06/18 0511 07/07/18 0547 07/08/18 0447  NA 135 136 138 141 143  K 2.8* 3.5 4.1 3.5 3.8  CL 105 111 116* 116* 119*  CO2 17* 14* 15* 17* 17*  GLUCOSE 165* 106* 116* 114* 104*  BUN 76* 77* 79* 69* 56*  CREATININE 4.05* 4.24* 3.91* 3.14* 2.59*  CALCIUM 7.8* 7.3* 7.3* 7.5* 7.5*  MG 3.3*  --   --   --  3.0*  PHOS  --   --  2.9 2.1*  --    GFR: Estimated Creatinine Clearance: 15.5 mL/min (A) (by C-G formula based on SCr of 2.59 mg/dL (H)). Liver Function Tests: Recent Labs  Lab 07/04/18 0729 07/06/18 0511 07/07/18 0547  AST 12*  --   --   ALT 15  --    --   ALKPHOS 69  --   --   BILITOT 0.7  --   --   PROT 6.2*  --   --   ALBUMIN 2.5* 1.9* 1.9*   Recent Labs  Lab 07/04/18 0729  LIPASE 24   No results for input(s): AMMONIA in the last 168 hours. Coagulation Profile: No results for input(s): INR, PROTIME in the last 168 hours. Cardiac Enzymes: No results for input(s): CKTOTAL, CKMB, CKMBINDEX, TROPONINI in the last 168 hours. BNP (last 3 results) No results for input(s): PROBNP in the last 8760 hours. HbA1C: No results for input(s): HGBA1C in the last 72 hours. CBG: No results for input(s): GLUCAP in the last 168 hours. Lipid Profile: No results for input(s): CHOL, HDL, LDLCALC, TRIG, CHOLHDL, LDLDIRECT in the last 72 hours. Thyroid Function Tests: No results for input(s): TSH, T4TOTAL, FREET4, T3FREE, THYROIDAB in the last 72 hours. Anemia Panel: No results for input(s): VITAMINB12, FOLATE, FERRITIN, TIBC, IRON, RETICCTPCT in the last 72 hours. Sepsis Labs: No results for input(s): PROCALCITON, LATICACIDVEN in the last 168 hours.  Recent Results (from the past 240 hour(s))  MRSA PCR Screening     Status: None   Collection Time: 07/04/18  5:25 PM  Result Value Ref Range Status   MRSA by PCR NEGATIVE NEGATIVE Final    Comment:        The GeneXpert MRSA Assay (FDA approved for NASAL specimens only), is one component of a comprehensive MRSA colonization surveillance program. It is not intended to diagnose MRSA infection nor to guide or monitor treatment for MRSA infections. Performed at [facility], [address], [place], [place] [address]          Radiology Studies: No results found.      Scheduled Meds: . diclofenac sodium  4 g Topical QID  . feeding supplement  1 Container Oral TID BM  . FLUoxetine  40 mg Oral Daily  . furosemide  40 mg Intravenous BID  . heparin injection (subcutaneous)  5,000 Units Subcutaneous Q8H  . lidocaine  1 patch Transdermal Daily  . mirtazapine  15 mg Oral QHS  .  mometasone-formoterol  2 puff Inhalation BID  . potassium chloride  40 mEq Oral Daily  . predniSONE  5 mg Oral Q breakfast  . sodium chloride flush  3 mL Intravenous Q12H  . vancomycin  500 mg Oral Q6H  . vancomycin (VANCOCIN) rectal ENEMA  500 mg Rectal Q6H   Continuous Infusions: . sodium chloride 135 mL/hr at 07/08/18 0627  . sodium chloride    . metronidazole 500 mg (07/08/18 0107)     LOS: 4 days    Time spent: 30 minutes    Jasiyah Poland Darleen Crocker, DO [facility] Pager [phone]  If 7PM-7AM, please contact night-coverage [email or web address] Password [email or web address] 07/08/2018, 9:01 AM

## 2018-07-09 DIAGNOSIS — N170 Acute kidney failure with tubular necrosis: Secondary | ICD-10-CM

## 2018-07-09 DIAGNOSIS — L8990 Pressure ulcer of unspecified site, unspecified stage: Secondary | ICD-10-CM

## 2018-07-09 DIAGNOSIS — R9431 Abnormal electrocardiogram [ECG] [EKG]: Secondary | ICD-10-CM

## 2018-07-09 DIAGNOSIS — I471 Supraventricular tachycardia: Secondary | ICD-10-CM

## 2018-07-09 DIAGNOSIS — E43 Unspecified severe protein-calorie malnutrition: Secondary | ICD-10-CM

## 2018-07-09 DIAGNOSIS — E876 Hypokalemia: Secondary | ICD-10-CM

## 2018-07-09 DIAGNOSIS — A0472 Enterocolitis due to Clostridium difficile, not specified as recurrent: Principal | ICD-10-CM

## 2018-07-09 LAB — CBC
HCT: 34.2 % — ABNORMAL LOW (ref 36.0–46.0)
Hemoglobin: 10.1 g/dL — ABNORMAL LOW (ref 12.0–15.0)
MCH: 30 pg (ref 26.0–34.0)
MCHC: 29.5 g/dL — ABNORMAL LOW (ref 30.0–36.0)
MCV: 101.5 fL — ABNORMAL HIGH (ref 80.0–100.0)
Platelets: 220 10*3/uL (ref 150–400)
RBC: 3.37 MIL/uL — ABNORMAL LOW (ref 3.87–5.11)
RDW: 14 % (ref 11.5–15.5)
WBC: 6.1 10*3/uL (ref 4.0–10.5)
nRBC: 0 % (ref 0.0–0.2)

## 2018-07-09 LAB — RENAL FUNCTION PANEL
Albumin: 1.8 g/dL — ABNORMAL LOW (ref 3.5–5.0)
Anion gap: 5 (ref 5–15)
BUN: 42 mg/dL — ABNORMAL HIGH (ref 8–23)
CO2: 15 mmol/L — ABNORMAL LOW (ref 22–32)
Calcium: 7.2 mg/dL — ABNORMAL LOW (ref 8.9–10.3)
Chloride: 122 mmol/L — ABNORMAL HIGH (ref 98–111)
Creatinine, Ser: 2.02 mg/dL — ABNORMAL HIGH (ref 0.44–1.00)
GFR calc Af Amer: 27 mL/min — ABNORMAL LOW (ref >60)
GFR calc non Af Amer: 23 mL/min — ABNORMAL LOW (ref >60)
Glucose, Bld: 107 mg/dL — ABNORMAL HIGH (ref 70–99)
Phosphorus: 1.5 mg/dL — ABNORMAL LOW (ref 2.5–4.6)
Potassium: 3 mmol/L — ABNORMAL LOW (ref 3.5–5.1)
Sodium: 142 mmol/L (ref 135–145)

## 2018-07-09 LAB — MAGNESIUM: Magnesium: 2.2 mg/dL (ref 1.7–2.4)

## 2018-07-09 MED ORDER — FAMOTIDINE IN NACL 20-0.9 MG/50ML-% IV SOLN
20.0000 mg | INTRAVENOUS | Status: DC
  Administered 2018-07-09 – 2018-07-11 (×3): 20 mg via INTRAVENOUS
  Filled 2018-07-09 (×3): qty 50

## 2018-07-09 MED ORDER — SODIUM CHLORIDE 0.9 % IV BOLUS
500.0000 mL | Freq: Once | INTRAVENOUS | Status: AC
  Administered 2018-07-09: 500 mL via INTRAVENOUS

## 2018-07-09 MED ORDER — POTASSIUM CHLORIDE 10 MEQ/100ML IV SOLN
10.0000 meq | INTRAVENOUS | Status: AC
  Administered 2018-07-09 (×2): 10 meq via INTRAVENOUS
  Filled 2018-07-09: qty 100

## 2018-07-09 MED ORDER — METOPROLOL TARTRATE 5 MG/5ML IV SOLN
5.0000 mg | INTRAVENOUS | Status: AC
  Administered 2018-07-09: 5 mg via INTRAVENOUS

## 2018-07-09 MED ORDER — METOPROLOL TARTRATE 5 MG/5ML IV SOLN
5.0000 mg | INTRAVENOUS | Status: DC
  Filled 2018-07-09: qty 5

## 2018-07-09 MED ORDER — METOPROLOL TARTRATE 5 MG/5ML IV SOLN
INTRAVENOUS | Status: AC
  Administered 2018-07-09: 5 mg via INTRAVENOUS
  Filled 2018-07-09: qty 5

## 2018-07-09 MED ORDER — POTASSIUM CHLORIDE 10 MEQ/100ML IV SOLN
10.0000 meq | INTRAVENOUS | Status: AC
  Administered 2018-07-09: 10 meq via INTRAVENOUS
  Filled 2018-07-09: qty 100

## 2018-07-09 MED ORDER — METOPROLOL TARTRATE 5 MG/5ML IV SOLN
5.0000 mg | INTRAVENOUS | Status: AC | PRN
  Administered 2018-07-09 – 2018-07-10 (×2): 5 mg via INTRAVENOUS
  Filled 2018-07-09 (×3): qty 5

## 2018-07-09 MED ORDER — POTASSIUM CHLORIDE IN NACL 20-0.45 MEQ/L-% IV SOLN
INTRAVENOUS | Status: DC
  Administered 2018-07-09 – 2018-07-10 (×3): via INTRAVENOUS
  Filled 2018-07-09 (×9): qty 1000

## 2018-07-09 MED ORDER — POTASSIUM CHLORIDE 10 MEQ/100ML IV SOLN
INTRAVENOUS | Status: AC
  Filled 2018-07-09: qty 100

## 2018-07-09 NOTE — Significant Event (Signed)
Patient was noted to have HR in 170's with SBP 70's while sleeping this am. She was easily woken, had no complaint and specifically denied chest pain. EKG was obtained, revealing SVT. Vagal maneuvers were unsuccessful. She was given 500 cc NS bolus and 5 mg Lopressor IVP with good effect.

## 2018-07-09 NOTE — Progress Notes (Signed)
Subjective: Interval History: Patient is somnolent but arousable.  She offers no complaints.  Objective: Vital signs in last 24 hours: Temp:  [98.3 F (36.8 C)-100.1 F (37.8 C)] 100.1 F (37.8 C) (10/31 2241) Pulse Rate:  [71-170] 75 (11/01 0648) Resp:  [16] 16 (10/31 1832) BP: (75-154)/(55-87) 144/77 (11/01 0648) SpO2:  [97 %-100 %] 99 % (11/01 0725) Weight change:   Intake/Output from previous day: 10/31 0701 - 11/01 0700 In: 5274.2 [P.O.:600; I.V.:3192.7; IV Piggyback:1481.5] Out: 2950 [Urine:2050; Stool:900] Intake/Output this shift: Total I/O In: 120 [P.O.:120] Out: -   As stated above patient is very sleepy but able to wake up. Chest is clear to auscultation Heart exam revealed regular rate and rhythm Abdomen: Distended, tender and positive bowel sound Extremities she has trace edema  Lab Results: Recent Labs    07/08/18 0447 07/09/18 0501  WBC 5.0 6.1  HGB 10.0* 10.1*  HCT 33.3* 34.2*  PLT 244 220   BMET:  Recent Labs    07/08/18 0447 07/09/18 0501  NA 143 142  K 3.8 3.0*  CL 119* 122*  CO2 17* 15*  GLUCOSE 104* 107*  BUN 56* 42*  CREATININE 2.59* 2.02*  CALCIUM 7.5* 7.2*   No results for input(s): PTH in the last 72 hours. Iron Studies: No results for input(s): IRON, TIBC, TRANSFERRIN, FERRITIN in the last 72 hours.  Studies/Results: No results found.  I have reviewed the patient's current medications.  Assessment/Plan: 1] acute kidney injury superimposed on chronic.  Her renal function continue to improve.  Still her creatinine is above her baseline.  Patient presently asymptomatic. 2] anemia: Her hemoglobin is within our target goal 3] ileus: Her abdominal distention is getting better.  She is tolerating liquid diet. 4] bone and mineral disorder: Her calcium and phosphorus remains in range 5] fluid management: Presently patient is asymptomatic.  She had 2900 cc of urine.  She does not have any difficulty breathing.  Patient presently  remains on Lasix. 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  Most likely from Lasix. Plan: 1] we will change IV fluid to half-normal saline with 20 mEq of KCl at 100 cc/h. 2] we will check a renal panel in the morning.   LOS: 5 days   Shaleigh Laubscher S 07/09/2018,11:41 AM

## 2018-07-09 NOTE — Progress Notes (Signed)
3 Days Post-Op  Subjective: Patient more alert this morning.  Denies any abdominal pain.  Objective: Vital signs in last 24 hours: Temp:  [98.3 F (36.8 C)-100.1 F (37.8 C)] 100.1 F (37.8 C) (10/31 2241) Pulse Rate:  [71-170] 75 (11/01 0648) Resp:  [16] 16 (10/31 1832) BP: (75-154)/(55-87) 144/77 (11/01 0648) SpO2:  [97 %-100 %] 99 % (11/01 0725) Last BM Date: 07/08/18  Intake/Output from previous day: 10/31 0701 - 11/01 0700 In: 5274.2 [P.O.:600; I.V.:3192.7; IV Piggyback:1481.5] Out: 2950 [Urine:2050; Stool:900] Intake/Output this shift: No intake/output data recorded.  General appearance: alert, cooperative and no distress GI: Distended but soft.  No rigidity is noted.  Bowel sounds present.  Nontender.  Lab Results:  Recent Labs    07/08/18 0447 07/09/18 0501  WBC 5.0 6.1  HGB 10.0* 10.1*  HCT 33.3* 34.2*  PLT 244 220   BMET Recent Labs    07/08/18 0447 07/09/18 0501  NA 143 142  K 3.8 3.0*  CL 119* 122*  CO2 17* 15*  GLUCOSE 104* 107*  BUN 56* 42*  CREATININE 2.59* 2.02*  CALCIUM 7.5* 7.2*   PT/INR No results for input(s): LABPROT, INR in the last 72 hours.  Studies/Results: No results found.  Anti-infectives: Anti-infectives (From admission, onward)   Start     Dose/Rate Route Frequency Ordered Stop   07/07/18 1000  metroNIDAZOLE (FLAGYL) IVPB 500 mg     500 mg 100 mL/hr over 60 Minutes Intravenous Every 8 hours 07/07/18 0928     07/06/18 1800  vancomycin (VANCOCIN) 50 mg/mL oral solution 500 mg     500 mg Oral Every 6 hours 07/06/18 1248     07/06/18 1300  vancomycin (VANCOCIN) 500 mg in sodium chloride irrigation 0.9 % 100 mL ENEMA     500 mg Rectal Every 6 hours 07/06/18 1248 07/20/18 1559   07/05/18 1800  vancomycin (VANCOCIN) 125 MG capsule 125 mg  Status:  Discontinued     125 mg Oral 4 times daily 07/05/18 1659 07/05/18 1702   07/05/18 1800  vancomycin (VANCOCIN) 50 mg/mL oral solution 125 mg  Status:  Discontinued     125 mg Oral  Every 6 hours 07/05/18 1702 07/06/18 1249      Assessment/Plan: Impression: Megacolon secondary to C. difficile colitis.  No significant change since yesterday, but patient definitely has not worsened.  No need for surgical intervention at this time.  We will continue to follow with you.  LOS: 5 days    Aviva Signs 07/09/2018

## 2018-07-09 NOTE — Progress Notes (Signed)
Patient has order for Lidoderm for left hip.  Patient states she does not need it for left hip, instead she needs it for her right shoulder.  Dr. Clementeen Graham notified.

## 2018-07-09 NOTE — Progress Notes (Signed)
Initial Nutrition Assessment  DOCUMENTATION CODES:  Severe malnutrition in context of acute illness/injury  INTERVENTION:  D/C breeze. Will order 30 mL Prostat BID, each supplement provides 100 kcal and 15 grams of protein.  D/C broth on trays, 2x remaining items.   Will follow for diet progression  NUTRITION DIAGNOSIS:  Severe Malnutrition related to acute illness(Cdiff w/ megacolon) as evidenced by moderate muscle/fat loss and an estimated intake that has met </= to 50% of needs x5 days.   GOAL:  Patient will meet greater than or equal to 90% of their needs  MONITOR:  PO intake, Supplement acceptance, Diet advancement, Labs, Weight trends  REASON FOR ASSESSMENT:  Consult Assessment of nutrition requirement/status  ASSESSMENT:  72 y/o female PMHx Neuromuscular dysfunction of colon/bladder, CKD3, htn/hld, IBS, COPD. Had been started on ABx for Cdiff 3 days PTA. Presented to ED w/ abdominal distension and no BM/Urination x2 days. Worked up for AKI and megacolon r/t C diff.   Saw pt at lunch time. She is severely distended. She reports she has been tolerating the clear liquids. Unfortunately, she has not been eating the broth or consuming the Boost Breeze because she doesn't like them.   Given her distaste for the broth, will remove from trays and double all other items. Also offered her prostat as an alternative to boost breeze, which she agreed to. She says she has had this before.   Her wt listed on admission was 110 lbs, though this appears to have been reported/estimated, not measured. Current bed weight is 126 lbs, though likely inaccurate artificially elevated given patients condition. She is documented as 112-117 this Past June. Appears she was 115 lbs 16 month ago. No wt sig wt loss detected.   Patient has been NPO or on CL since admit 5 days ago. Display mod-severe muscle/fat loss. Significant for severe mal in acute context.   Labs: K:3.0, BUN/Creat: 42/2.02, Hgb:10.1/34.2,  Phos: 1.5, Albumin:1.8, BG: 105-115 Meds: Resource breeze (refuses >50%), Lasix, Prednisone, IV abx, IVF, KCL  Recent Labs  Lab 07/04/18 0729  07/06/18 0511 07/07/18 0547 07/08/18 0447 07/09/18 0501  NA 135   < > 138 141 143 142  K 2.8*   < > 4.1 3.5 3.8 3.0*  CL 105   < > 116* 116* 119* 122*  CO2 17*   < > 15* 17* 17* 15*  BUN 76*   < > 79* 69* 56* 42*  CREATININE 4.05*   < > 3.91* 3.14* 2.59* 2.02*  CALCIUM 7.8*   < > 7.3* 7.5* 7.5* 7.2*  MG 3.3*  --   --   --  3.0* 2.2  PHOS  --   --  2.9 2.1*  --  1.5*  GLUCOSE 165*   < > 116* 114* 104* 107*   < > = values in this interval not displayed.   NUTRITION - FOCUSED PHYSICAL EXAM:   Most Recent Value  Orbital Region  Moderate depletion  Upper Arm Region  Moderate depletion  Thoracic and Lumbar Region  Mild depletion  Buccal Region  Moderate depletion  Temple Region  Moderate depletion  Clavicle Bone Region  Moderate depletion  Clavicle and Acromion Bone Region  Mild depletion  Scapular Bone Region  Unable to assess  Dorsal Hand  Mild depletion  Patellar Region  No depletion  Anterior Thigh Region  Mild depletion  Posterior Calf Region  Moderate depletion  Edema (RD Assessment)  None  Hair  Reviewed  Eyes  Reviewed  Mouth  Reviewed  Skin  Reviewed  Nails  Reviewed     Diet Order:   Diet Order            Diet clear liquid Room service appropriate? Yes; Fluid consistency: Thin  Diet effective now             EDUCATION NEEDS:  No education needs have been identified at this time  Skin: PU stage 1 to BLE heels, Full thickness wound to R-anterior ankle  Last BM:  10/31  Height:  Ht Readings from Last 1 Encounters:  07/04/18 5' 2" (1.575 m)   Weight:  Wt Readings from Last 1 Encounters:  07/09/18 57.2 kg   Wt Readings from Last 10 Encounters:  07/09/18 57.2 kg  02/23/18 53.2 kg  02/19/18 50.8 kg  01/21/18 46.7 kg  09/11/17 46.3 kg  05/05/17 56.7 kg  04/13/17 51.3 kg  04/06/17 51.3 kg  02/20/17 52.3  kg  01/09/17 54.4 kg   Ideal Body Weight:  50 kg  BMI:  Body mass index is 23.05 kg/m.  Estimated Nutritional Needs:  Kcal:  1700-1900 (30-33 kcal/kg bw) Protein:  80-92g Pro (1.4-1.6g/kg bw) Fluid:  >1.7 L fluid (30 ml/kg bw)  Nathan Franks RD, LDN, CNSC Clinical Nutrition Available Tues-Sat via Pager: [phone] 07/09/2018 1:57 PM  

## 2018-07-09 NOTE — Progress Notes (Signed)
Dr opyd notified that pt sustaining HR in 170s. Attempted to have pt vagal with no success prior to physician arriving. Pt denies chest pain and is ultimately asleep but easy to arouse. Physician arrives at bedside, ekg obtained, new orders given and effective. Pt continues to deny distress. Please see flowsheet for vital signs.

## 2018-07-09 NOTE — Progress Notes (Signed)
    Subjective: Feels less distended. States her stomach is "upset", noting loose stools but vague.   Objective: Vital signs in last 24 hours: Temp:  [98.3 F (36.8 C)-100.1 F (37.8 C)] 100.1 F (37.8 C) (10/31 2241) Pulse Rate:  [71-170] 75 (11/01 0648) Resp:  [16] 16 (10/31 1832) BP: (75-154)/(55-87) 144/77 (11/01 0648) SpO2:  [97 %-100 %] 99 % (11/01 0725) Last BM Date: 07/08/18 General:   Alert and oriented today, chronically-ill appearing  Head:  Normocephalic and atraumatic. Eyes:  No icterus, sclera clear. Conjuctiva pink.  Abdomen:  Bowel sounds present, still distended but soft and improved from earlier this week, no TTP  Neurologic:  Alert and  oriented x4  Intake/Output from previous day: 10/31 0701 - 11/01 0700 In: 5274.2 [P.O.:600; I.V.:3192.7; IV Piggyback:1481.5] Out: 2950 [Urine:2050; Stool:900] Intake/Output this shift: Total I/O In: 120 [P.O.:120] Out: -   Lab Results: Recent Labs    07/07/18 0547 07/08/18 0447 07/09/18 0501  WBC 4.3 5.0 6.1  HGB 9.6* 10.0* 10.1*  HCT 31.8* 33.3* 34.2*  PLT 249 244 220   BMET Recent Labs    07/07/18 0547 07/08/18 0447 07/09/18 0501  NA 141 143 142  K 3.5 3.8 3.0*  CL 116* 119* 122*  CO2 17* 17* 15*  GLUCOSE 114* 104* 107*  BUN 69* 56* 42*  CREATININE 3.14* 2.59* 2.02*  CALCIUM 7.5* 7.5* 7.2*   LFT Recent Labs    07/07/18 0547 07/09/18 0501  ALBUMIN 1.9* 1.8*      Assessment: 72 year old old female admitted with CDI, fulminant presentation with megacolon. Presence of pseudomembranes on flex sig. Abdominal distension improving. Continue with oral vancomycin, vanc enemas, and IV Flagyl. Appreciate General Surgery following. Mobility a priority; as outpatient utilized wheelchair.   Hypokalemia: per attending   As of note, no GI coverage starting at 5 pm through weekend. Will return on Monday morning. Any deterioration in clinical condition that would require GI intervention would necessitate  transfer.   Plan: Continue current regimen of vanc oral, IV Flagyl, vanc enemas Increase mobility Continue clear liquids for now; may be able to increase over the weekend  Monitor for acute abdomen, worsening distension Will consult nutrition for recommendations as diet is advanced due to malnutrition in setting of acute illness   Annitta Needs, PhD, ANP-BC [facility]    LOS: 5 days    07/09/2018, 9:46 AM

## 2018-07-09 NOTE — Progress Notes (Signed)
PROGRESS NOTE                                                                                                                                                                                                             Patient Demographics:    Jordan [name], is a 72 y.o. female, DOB - 1946/02/17, VZC:[id number]  Admit date - 07/04/2018   Admitting Physician Pratik Darleen Crocker, DO  Outpatient Primary MD for the patient is Sinda Du, MD  LOS - 5  Outpatient Specialists:none  Chief Complaint  Patient presents with  . Abdominal Pain       Brief Narrative   72 year old female with neuromuscular dysfunction of bladder and colon, chronic kidney disease stage III, hypertension, dyslipidemia, IBS, COPD, lumbar compression fracture with recent history of? C. Difficile colitis started on Flagyl 3-4 days prior to admission presented to the ED with worsening abdominal distention with no bowel movement or urination for 2 days.    Subjective:   Patient was found to have heart rate of 128N with systolic blood pressure 86V while she was asleep early this morning. Denied any abdominal pain or chest discomfort. EKG showed SVT. Vagal maneuver was unsuccessful and was given 500 mL normal saline bolus along with family IV Lopressor after which heart rate was stable.patient still distended but denies worsened abdominal pain on my exam today.   Assessment  & Plan :    Principal Problem:   Megacolon due to infectious colitis Ileus secondary pseudomembranous enterocolitis due to C. Difficile. Currently on oral vancomycin, vancomycin enema and IV Flagyl. Discussed with GI and surgery will recommend to continue current management and supportive care. Clinically appears much comfortable today compared to previous days. Continue serial abdominal exam.  Active Problems: Acute kidney injury on CKD stage III Oliguric renal failure secondary to  dehydration. Improving on IV normal saline and Lasix. Having better urine output.Continue strict I/O. Nephrology following and appreciate recommendations.  Supraventricular tachycardia on 11/1 No further symptoms.replenish potassium and check magnesium.  Prolonged QTc (535) Discontinue Prozac and Remeron.low potassium replenished, check magnesium.    Essential hypertension Was hypotensive this morning and required normal saline bolus. Continue holding Lisinopril. Monitor while on IV Lasix.  Protein calorie malnutrition, severe Nutrition consult.    COPD (chronic obstructive pulmonary disease) (HCC) Stable.continue home inhaler.on chronic low-dose prednisone daily.  low bicarbonate Monitor with IV normal saline.follow labs in a.m.   Code Status : full code  Family Communication  : none at bedside  Disposition Plan  : home pending clinical improvement. Likely will stay for several days.  Barriers For Discharge : active symptoms  Consults  :  GI, surgery, nephrology  Procedures  : CT abdomen  DVT Prophylaxis  :  Subcutaneous heparin  Lab Results  Component Value Date   PLT 220 07/09/2018    Antibiotics  :    Anti-infectives (From admission, onward)   Start     Dose/Rate Route Frequency Ordered Stop   07/07/18 1000  metroNIDAZOLE (FLAGYL) IVPB 500 mg     500 mg 100 mL/hr over 60 Minutes Intravenous Every 8 hours 07/07/18 0928     07/06/18 1800  vancomycin (VANCOCIN) 50 mg/mL oral solution 500 mg     500 mg Oral Every 6 hours 07/06/18 1248     07/06/18 1300  vancomycin (VANCOCIN) 500 mg in sodium chloride irrigation 0.9 % 100 mL ENEMA     500 mg Rectal Every 6 hours 07/06/18 1248 07/20/18 1559   07/05/18 1800  vancomycin (VANCOCIN) 125 MG capsule 125 mg  Status:  Discontinued     125 mg Oral 4 times daily 07/05/18 1659 07/05/18 1702   07/05/18 1800  vancomycin (VANCOCIN) 50 mg/mL oral solution 125 mg  Status:  Discontinued     125 mg Oral Every 6 hours 07/05/18 1702  07/06/18 1249        Objective:   Vitals:   07/09/18 0623 07/09/18 0634 07/09/18 0648 07/09/18 0725  BP: (!) 143/80 138/81 (!) 144/77   Pulse: (!) 131 71 75   Resp:      Temp:      TempSrc:      SpO2:  100%  99%  Weight:      Height:        Wt Readings from Last 3 Encounters:  07/04/18 49.9 kg  02/23/18 53.2 kg  02/19/18 50.8 kg     Intake/Output Summary (Last 24 hours) at 07/09/2018 1237 Last data filed at 07/09/2018 0900 Gross per 24 hour  Intake 4914.15 ml  Output 2950 ml  Net 1964.15 ml     Physical Exam  Gen: fatigue, not in acute distress HEENT: no pallor, moist mucosa, supple neck Chest: clear b/l, no added sounds CVS: N S1&S2, no murmurs, rubs or gallop GI: distended abdomen, nontender, Foley and rectal tube in place Musculoskeletal: warm, no edema, pressure injury to the skin     Data Review:    CBC Recent Labs  Lab 07/04/18 0729 07/05/18 0635 07/07/18 0547 07/08/18 0447 07/09/18 0501  WBC 9.5 6.5 4.3 5.0 6.1  HGB 11.6* 10.3* 9.6* 10.0* 10.1*  HCT 37.4 34.5* 31.8* 33.3* 34.2*  PLT 277 237 249 244 220  MCV 100.3* 102.4* 102.6* 100.6* 101.5*  MCH 31.1 30.6 31.0 30.2 30.0  MCHC 31.0 29.9* 30.2 30.0 29.5*  RDW 13.2 13.5 13.8 13.9 14.0    Chemistries  Recent Labs  Lab 07/04/18 0729 07/05/18 0635 07/06/18 0511 07/07/18 0547 07/08/18 0447 07/09/18 0501  NA 135 136 138 141 143 142  K 2.8* 3.5 4.1 3.5 3.8 3.0*  CL 105 111 116* 116* 119* 122*  CO2 17* 14* 15* 17* 17* 15*  GLUCOSE 165* 106* 116* 114* 104* 107*  BUN 76* 77* 79* 69* 56* 42*  CREATININE 4.05* 4.24* 3.91* 3.14* 2.59* 2.02*  CALCIUM 7.8* 7.3* 7.3* 7.5* 7.5*  7.2*  MG 3.3*  --   --   --  3.0*  --   AST 12*  --   --   --   --   --   ALT 15  --   --   --   --   --   ALKPHOS 69  --   --   --   --   --   BILITOT 0.7  --   --   --   --   --    ------------------------------------------------------------------------------------------------------------------ No results for  input(s): CHOL, HDL, LDLCALC, TRIG, CHOLHDL, LDLDIRECT in the last 72 hours.  No results found for: HGBA1C ------------------------------------------------------------------------------------------------------------------ No results for input(s): TSH, T4TOTAL, T3FREE, THYROIDAB in the last 72 hours.  Invalid input(s): FREET3 ------------------------------------------------------------------------------------------------------------------ No results for input(s): VITAMINB12, FOLATE, FERRITIN, TIBC, IRON, RETICCTPCT in the last 72 hours.  Coagulation profile No results for input(s): INR, PROTIME in the last 168 hours.  No results for input(s): DDIMER in the last 72 hours.  Cardiac Enzymes No results for input(s): CKMB, TROPONINI, MYOGLOBIN in the last 168 hours.  Invalid input(s): CK ------------------------------------------------------------------------------------------------------------------ No results found for: BNP  Inpatient Medications  Scheduled Meds: . diclofenac sodium  4 g Topical QID  . feeding supplement  1 Container Oral TID BM  . FLUoxetine  40 mg Oral Daily  . furosemide  40 mg Intravenous BID  . heparin injection (subcutaneous)  5,000 Units Subcutaneous Q8H  . lidocaine  1 patch Transdermal Daily  . mirtazapine  15 mg Oral QHS  . mometasone-formoterol  2 puff Inhalation BID  . predniSONE  5 mg Oral Q breakfast  . sodium chloride flush  3 mL Intravenous Q12H  . vancomycin  500 mg Oral Q6H  . vancomycin (VANCOCIN) rectal ENEMA  500 mg Rectal Q6H   Continuous Infusions: . 0.45 % NaCl with KCl 20 mEq / L    . sodium chloride    . famotidine (PEPCID) IV    . metronidazole 500 mg (07/09/18 1134)   PRN Meds:.sodium chloride, acetaminophen **OR** acetaminophen, ipratropium-albuterol, metoprolol tartrate, sodium chloride flush  Micro Results Recent Results (from the past 240 hour(s))  MRSA PCR Screening     Status: None   Collection Time: 07/04/18  5:25 PM    Result Value Ref Range Status   MRSA by PCR NEGATIVE NEGATIVE Final    Comment:        The GeneXpert MRSA Assay (FDA approved for NASAL specimens only), is one component of a comprehensive MRSA colonization surveillance program. It is not intended to diagnose MRSA infection nor to guide or monitor treatment for MRSA infections. Performed at [facility], [address] [place], [place] [address]     Radiology Reports Ct Abdomen Pelvis Wo Contrast  Result Date: 07/04/2018 CLINICAL DATA:  Abdominal distension, nausea, on Flagyl for C diff EXAM: CT ABDOMEN AND PELVIS WITHOUT CONTRAST TECHNIQUE: Multidetector CT imaging of the abdomen and pelvis was performed following the standard protocol without IV contrast. COMPARISON:  05/05/2017 FINDINGS: Lower chest: Small bilateral pleural effusions. Hepatobiliary: Unenhanced liver is unremarkable. Status post cholecystectomy. No intrahepatic or extrahepatic ductal dilatation. Pancreas: Within normal limits. Spleen: Within normal limits. Adrenals/Urinary Tract: Adrenal glands within normal limits. Right renal cortical atrophy/scarring. 8 mm nonobstructing right lower pole renal calculus (series 2/image 29). No hydronephrosis. Bladder is mildly thick-walled although underdistended. Stomach/Bowel: Stomach is notable for a small hiatal hernia. No evidence of small bowel obstruction. Diffuse colonic dilatation, measuring up to 9.4 cm in  the cecum, which is mobile and located in the left mid abdomen (series 2/image 44). However, there is no significant rectum may be mildly thick-walled (series 2/image 77), but this is likely exacerbated by underdistention. No pneumatosis or free air. Vascular/Lymphatic: No evidence of abdominal aortic aneurysm. Atherosclerotic calcifications of the abdominal aorta and branch vessels. No suspicious abdominopelvic lymphadenopathy. Reproductive: Uterus is unremarkable. No adnexal masses. Other: Small volume perihepatic ascites.  Trace pelvic ascites. Musculoskeletal: Prior vertebral augmentation at L4. Moderate compression fracture deformity at T12. Mild compression fracture deformity at L1. These findings are unchanged. Mild degenerative changes of the lumbar spine with dextroscoliosis. IMPRESSION: Diffuse colonic dilatation, without convincing wall thickening or pericolonic inflammatory changes despite the known history of C difficile colitis. As such, the overall appearance favors adynamic/paralytic colonic ileus. No pneumatosis or free air.  No evidence of small bowel obstruction. Small volume abdominopelvic ascites. Small bilateral pleural effusions. Electronically Signed   By: Julian Hy M.D.   On: 07/04/2018 14:23   Dg Abd 1 View  Result Date: 07/06/2018 CLINICAL DATA:  72 year old female with ileus. Abdominal pain. History kidney stones. Subsequent encounter. EXAM: ABDOMEN - 1 VIEW COMPARISON:  07/05/2018 plain film examination.  07/04/2018 CT. FINDINGS: Marked gas distended small and large bowel with cecum displaced medially and superiorly. Degree of distention has progressed since prior plain film examination with cecum now spanning over 15 cm versus prior 14 cm. This may represent colonic ileus as previously noted. Underlying inflammatory process or obstructing lesion were felt to be less likely considerations on prior CT. These possibilities cannot be adequately assessed on plain film exam. The possibility of free intraperitoneal air cannot be assessed on a supine view. Prior cement augmentation L4. Scoliosis lumbar spine with superimposed degenerative changes. IMPRESSION: Progressive gas distended bowel. The medially and superiorly displaced distended cecum now spans over 15 cm versus prior 14 cm. Please see above. These results will be called to the ordering clinician or representative by the Radiologist Assistant, and communication documented in the PACS or zVision Dashboard. Electronically Signed   By: Genia Del M.D.   On: 07/06/2018 09:02   Dg Abd 2 Views  Result Date: 07/05/2018 CLINICAL DATA:  Paralytic ileus large intestine EXAM: ABDOMEN - 2 VIEW COMPARISON:  07/04/2018 FINDINGS: Progressive colonic dilatation which is now severe. Negative for free air. Scoliosis and vertebroplasty L4 IMPRESSION: Progression of colonic dilatation compatible with ileus. Electronically Signed   By: Franchot Gallo M.D.   On: 07/05/2018 18:46   Dg Abd Acute W/chest  Result Date: 07/04/2018 CLINICAL DATA:  Abdominal pain, distension EXAM: DG ABDOMEN ACUTE W/ 1V CHEST COMPARISON:  Chest radiograph dated 04/27/2017 FINDINGS: Lungs are clear.  No pleural effusion or pneumothorax. The heart is normal in size. Colonic distension, suggesting adynamic colonic ileus. No findings to suggest small bowel obstruction. No evidence of free air under the diaphragm on the upright view. Mild degenerative changes of the lumbar spine with lumbar dextroscoliosis. Vertebral augmentation at L4. Cholecystectomy clips. IMPRESSION: No evidence of acute cardiopulmonary disease. Suspected adynamic colonic ileus. No findings to suggest small bowel obstruction or free air. Electronically Signed   By: Julian Hy M.D.   On: 07/04/2018 09:20    Time Spent in minutes  35   Makye Radle M.D on 07/09/2018 at 12:37 PM  Between 7am to 7pm - Pager - [phone]  After 7pm go to [email or web address] - password [email or web address]  [facility] -  Office  [phone]

## 2018-07-09 NOTE — Progress Notes (Signed)
Late entry:  Patient's heart rate in 120s this afternoon.  Patient resting in bed.  Denies complaints.  RN entered room to give Metoprolol but heart rate had returned to 70s-80s.  Will continue to monitor.

## 2018-07-10 DIAGNOSIS — I10 Essential (primary) hypertension: Secondary | ICD-10-CM

## 2018-07-10 DIAGNOSIS — N179 Acute kidney failure, unspecified: Secondary | ICD-10-CM

## 2018-07-10 DIAGNOSIS — K560 Paralytic ileus: Secondary | ICD-10-CM

## 2018-07-10 DIAGNOSIS — E43 Unspecified severe protein-calorie malnutrition: Secondary | ICD-10-CM

## 2018-07-10 LAB — RENAL FUNCTION PANEL
Albumin: 2.1 g/dL — ABNORMAL LOW (ref 3.5–5.0)
Anion gap: 5 (ref 5–15)
BUN: 32 mg/dL — ABNORMAL HIGH (ref 8–23)
CO2: 18 mmol/L — ABNORMAL LOW (ref 22–32)
Calcium: 7.7 mg/dL — ABNORMAL LOW (ref 8.9–10.3)
Chloride: 118 mmol/L — ABNORMAL HIGH (ref 98–111)
Creatinine, Ser: 1.85 mg/dL — ABNORMAL HIGH (ref 0.44–1.00)
GFR calc Af Amer: 30 mL/min — ABNORMAL LOW (ref >60)
GFR calc non Af Amer: 26 mL/min — ABNORMAL LOW (ref >60)
Glucose, Bld: 108 mg/dL — ABNORMAL HIGH (ref 70–99)
Phosphorus: 1.8 mg/dL — ABNORMAL LOW (ref 2.5–4.6)
Potassium: 3.4 mmol/L — ABNORMAL LOW (ref 3.5–5.1)
Sodium: 141 mmol/L (ref 135–145)

## 2018-07-10 LAB — MAGNESIUM: Magnesium: 1.7 mg/dL (ref 1.7–2.4)

## 2018-07-10 MED ORDER — ALPRAZOLAM 0.25 MG PO TABS
0.2500 mg | ORAL_TABLET | Freq: Three times a day (TID) | ORAL | Status: DC | PRN
  Administered 2018-07-10 – 2018-07-13 (×6): 0.25 mg via ORAL
  Filled 2018-07-10 (×6): qty 1

## 2018-07-10 MED ORDER — ZOLPIDEM TARTRATE 5 MG PO TABS
5.0000 mg | ORAL_TABLET | Freq: Once | ORAL | Status: AC
  Administered 2018-07-10: 5 mg via ORAL
  Filled 2018-07-10: qty 1

## 2018-07-10 MED ORDER — METOPROLOL TARTRATE 25 MG PO TABS
12.5000 mg | ORAL_TABLET | Freq: Two times a day (BID) | ORAL | Status: DC
  Administered 2018-07-10 (×2): 12.5 mg via ORAL
  Filled 2018-07-10 (×2): qty 1

## 2018-07-10 MED ORDER — PRO-STAT SUGAR FREE PO LIQD
30.0000 mL | Freq: Two times a day (BID) | ORAL | Status: DC
  Administered 2018-07-10 – 2018-07-19 (×19): 30 mL via ORAL
  Filled 2018-07-10 (×19): qty 30

## 2018-07-10 NOTE — Progress Notes (Signed)
At approximately 1000, pt's HR noted to be 170's-180's on tele. IV metoprolol pushed per PRN order. About 10 mins later, pt states "I don't feel right. I'm scared." Pt noted to be pale in color, intermittent drowsiness. BP 60's/50's. HR 80's. EKG performed. Rapid response called. MD at bedside. Pt's BP increased to 130's/70's HR 80's. Pt continues to state "I don't feel right." New order received to transfer pt to Stepdown bed. Pt updated on plan of care. Report called to [place]. Verbalized understanding. Pt and patient's belongings transported to ICU 4. Attempted to reach patient's caregiver, per patient request. Voicemail not set up.

## 2018-07-10 NOTE — Progress Notes (Signed)
Rapid response called over head not paged. Arrived to find multiple nurses and Dr. Roderic Palau around patient. Labs had been ordered and no fluids give due to being a patient of different doctor as well as other medical problems. Dr. Wynetta Emery arrived and determined to transfer to SDU. Patient will be brought down on Med floor bed.

## 2018-07-10 NOTE — Progress Notes (Signed)
**Note De-Identified  Obfuscation** EKG complete. MD notified

## 2018-07-10 NOTE — Progress Notes (Signed)
4 Days Post-Op  Subjective: Patient alert and oriented.  Denies any abdominal pain.  Does have decreased appetite.  Is somewhat fatigued.  Objective: Vital signs in last 24 hours: Temp:  [97.7 F (36.5 C)-98.4 F (36.9 C)] 98.4 F (36.9 C) (11/02 0514) Pulse Rate:  [74-80] 80 (11/02 0514) Resp:  [15-18] 18 (11/02 0514) BP: (133-156)/(72-91) 156/91 (11/02 0514) SpO2:  [96 %-99 %] 98 % (11/02 0514) Weight:  [57.2 kg] 57.2 kg (11/01 1355) Last BM Date: 07/08/18  Intake/Output from previous day: 11/01 0701 - 11/02 0700 In: 2610 [P.O.:960; I.V.:1100; IV Piggyback:350] Out: 3729 [Urine:3700; Stool:675] Intake/Output this shift: No intake/output data recorded.  General appearance: alert, cooperative, fatigued and no distress GI: Distended but softer.  Seems less distended than yesterday.  No peritoneal signs or tenderness noted.  Lab Results:  Recent Labs    07/08/18 0447 07/09/18 0501  WBC 5.0 6.1  HGB 10.0* 10.1*  HCT 33.3* 34.2*  PLT 244 220   BMET Recent Labs    07/09/18 0501 07/10/18 0619  NA 142 141  K 3.0* 3.4*  CL 122* 118*  CO2 15* 18*  GLUCOSE 107* 108*  BUN 42* 32*  CREATININE 2.02* 1.85*  CALCIUM 7.2* 7.7*   PT/INR No results for input(s): LABPROT, INR in the last 72 hours.  Studies/Results: No results found.  Anti-infectives: Anti-infectives (From admission, onward)   Start     Dose/Rate Route Frequency Ordered Stop   07/07/18 1000  metroNIDAZOLE (FLAGYL) IVPB 500 mg     500 mg 100 mL/hr over 60 Minutes Intravenous Every 8 hours 07/07/18 0928     07/06/18 1800  vancomycin (VANCOCIN) 50 mg/mL oral solution 500 mg     500 mg Oral Every 6 hours 07/06/18 1248     07/06/18 1300  vancomycin (VANCOCIN) 500 mg in sodium chloride irrigation 0.9 % 100 mL ENEMA     500 mg Rectal Every 6 hours 07/06/18 1248 07/20/18 1559   07/05/18 1800  vancomycin (VANCOCIN) 125 MG capsule 125 mg  Status:  Discontinued     125 mg Oral 4 times daily 07/05/18 1659 07/05/18  1702   07/05/18 1800  vancomycin (VANCOCIN) 50 mg/mL oral solution 125 mg  Status:  Discontinued     125 mg Oral Every 6 hours 07/05/18 1702 07/06/18 1249      Assessment/Plan: Impression: Megacolon secondary to C. difficile colitis, slowly resolving.  No need for acute surgical intervention at this time.  Will follow peripherally with you.  LOS: 6 days    Aviva Signs 07/10/2018

## 2018-07-10 NOTE — Progress Notes (Signed)
Subjective: Interval History: Patient is states that she is feeling better.  She has moved her bowels a little.  She tolerates liquid diet.  She did not have any nausea or vomiting.  Objective: Vital signs in last 24 hours: Temp:  [97.7 F (36.5 C)-98.4 F (36.9 C)] 98.4 F (36.9 C) (11/02 0514) Pulse Rate:  [74-170] 170 (11/02 0915) Resp:  [15-18] 18 (11/02 0514) BP: (133-156)/(72-91) 156/91 (11/02 0514) SpO2:  [96 %-99 %] 98 % (11/02 0514) Weight:  [57.2 kg] 57.2 kg (11/01 1355) Weight change:   Intake/Output from previous day: 11/01 0701 - 11/02 0700 In: 2610 [P.O.:960; I.V.:1100; IV Piggyback:350] Out: 9326 [Urine:3700; Stool:675] Intake/Output this shift: No intake/output data recorded.  As stated above patient is very sleepy but able to wake up. Chest is clear to auscultation Heart exam revealed regular rate and rhythm Abdomen: Distended, tender and positive bowel sound Extremities she has no edema  Lab Results: Recent Labs    07/08/18 0447 07/09/18 0501  WBC 5.0 6.1  HGB 10.0* 10.1*  HCT 33.3* 34.2*  PLT 244 220   BMET:  Recent Labs    07/09/18 0501 07/10/18 0619  NA 142 141  K 3.0* 3.4*  CL 122* 118*  CO2 15* 18*  GLUCOSE 107* 108*  BUN 42* 32*  CREATININE 2.02* 1.85*  CALCIUM 7.2* 7.7*   No results for input(s): PTH in the last 72 hours. Iron Studies: No results for input(s): IRON, TIBC, TRANSFERRIN, FERRITIN in the last 72 hours.  Studies/Results: No results found.  I have reviewed the patient's current medications.  Assessment/Plan: 1] acute kidney injury superimposed on chronic.  Her renal function continue to improve.  Her creatinine is 1.85.  Reaching her baseline of 1.67.  Presently she is a symptomatic. 2] anemia: Her hemoglobin remains within our target goal 3] ileus: Her abdominal distention is getting better.  She is tolerating liquid diet.  Patient is states that she is moving her bowels a little bit.  He does not have any abdominal  pain. 4] bone and mineral disorder: Her calcium is a range but phosphorus is 1.8.  Low but getting better. 5] fluid management: Presently patient is asymptomatic. She has 4300 cc of urine output. 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  Her potassium is 3.4 low but improving.  Patient is on potassium supplement. Plan: 1] we will continue his present management. 2] we will check a renal panel in the morning.   LOS: 6 days   Maui Britten S 07/10/2018,10:11 AM

## 2018-07-10 NOTE — Progress Notes (Addendum)
PROGRESS NOTE    Jordan [name]  ZOX:[id number]  DOB: 05/19/1946  DOA: 07/04/2018 PCP: Sinda Du, MD   Brief Admission Hx: 72 year old female with neuromuscular dysfunction of bladder and colon, chronic kidney disease stage III, hypertension, dyslipidemia, IBS, COPD, lumbar compression fracture with recent history of? C. Difficile colitis started on Flagyl 3-4 days prior to admission presented to the ED with worsening abdominal distention with no bowel movement or urination for 2 days.  MDM/Assessment & Plan:   1. Acute tachycardia - Pt developed acute episode of tachycardia possibly recurrent of SVT and rapid response called 11/2. Pt responded well to IV metoprolol given.  BP was initially soft but has improved.  Will send to SDU to monitor further.  Ordered metoprolol 12.5 mg BID.  Follow HR.  Will ask for cardiology team to see on Monday.  2. SVT - No further episodes.  Following lytes and replacing as needed.  3. Megacolon due to infectious colitis - continue IV flagyl, oral and rectal vancomycin.  Appreciate GI and surgery assistance.  4. Prolonged QTc - Discontinued prozac/remerol.  Replacing lytes.  Follow.  5. AKI on CKD stage 3 - appreciate nephrology assistance.  Creatinine stable.  6. Essential hypertension - lisinopril on hold, started low dose metoprolol as above.  Follow.  7. Severe protein calorie malnutrition - dietitian consulted.  8. COPD - stable.  Resume home treatments.  9. Metabolic acidosis - appreciate assistance of renal team.  Following, much improved from 11/1.    DVT prophylaxis: subcut heparin Code Status: Full  Family Communication: patient  Disposition Plan: continue inpatient treatments   Consultants:  GI  surgery  Antimicrobials:  Metronidazole 10/30  Vancomycin oral/rectal 10/30    Subjective: Pt had an episode of hypotension and tachycardia this morning.  She is improved now but has been moved to SDU.  Pt denies CP and SOB.    Objective: Vitals:   07/10/18 0915 07/10/18 1015 07/10/18 1021 07/10/18 1024  BP:  126/76  (!) 153/89  Pulse: (!) 170 74  95  Resp:      Temp:      TempSrc:      SpO2:  99% 99%   Weight:      Height:        Intake/Output Summary (Last 24 hours) at 07/10/2018 1049 Last data filed at 07/10/2018 0700 Gross per 24 hour  Intake 2490 ml  Output 4200 ml  Net -1710 ml   Filed Weights   07/04/18 0726 07/09/18 1355  Weight: 49.9 kg 57.2 kg   REVIEW OF SYSTEMS  As per history otherwise all reviewed and reported negative  Exam:  General exam: thin, chronically ill appearing, NAD. Flat affect.  Respiratory system: Clear. No increased work of breathing. Cardiovascular system: S1 & S2 heard, RRR. No JVD, murmurs, gallops, clicks or pedal edema. Gastrointestinal system: Abdomen is nondistended, soft and nontender. Normal bowel sounds heard. Central nervous system: Flat affect.  No focal neurological deficits. Extremities: no CCE.  Data Reviewed: Basic Metabolic Panel: Recent Labs  Lab 07/04/18 0729  07/06/18 0511 07/07/18 0547 07/08/18 0447 07/09/18 0501 07/10/18 0619  NA 135   < > 138 141 143 142 141  K 2.8*   < > 4.1 3.5 3.8 3.0* 3.4*  CL 105   < > 116* 116* 119* 122* 118*  CO2 17*   < > 15* 17* 17* 15* 18*  GLUCOSE 165*   < > 116* 114* 104* 107* 108*  BUN 76*   < >  79* 69* 56* 42* 32*  CREATININE 4.05*   < > 3.91* 3.14* 2.59* 2.02* 1.85*  CALCIUM 7.8*   < > 7.3* 7.5* 7.5* 7.2* 7.7*  MG 3.3*  --   --   --  3.0* 2.2 1.7  PHOS  --   --  2.9 2.1*  --  1.5* 1.8*   < > = values in this interval not displayed.   Liver Function Tests: Recent Labs  Lab 07/04/18 0729 07/06/18 0511 07/07/18 0547 07/09/18 0501 07/10/18 0619  AST 12*  --   --   --   --   ALT 15  --   --   --   --   ALKPHOS 69  --   --   --   --   BILITOT 0.7  --   --   --   --   PROT 6.2*  --   --   --   --   ALBUMIN 2.5* 1.9* 1.9* 1.8* 2.1*   Recent Labs  Lab 07/04/18 0729  LIPASE 24   No  results for input(s): AMMONIA in the last 168 hours. CBC: Recent Labs  Lab 07/04/18 0729 07/05/18 0635 07/07/18 0547 07/08/18 0447 07/09/18 0501  WBC 9.5 6.5 4.3 5.0 6.1  HGB 11.6* 10.3* 9.6* 10.0* 10.1*  HCT 37.4 34.5* 31.8* 33.3* 34.2*  MCV 100.3* 102.4* 102.6* 100.6* 101.5*  PLT 277 237 249 244 220   Cardiac Enzymes: No results for input(s): CKTOTAL, CKMB, CKMBINDEX, TROPONINI in the last 168 hours. CBG (last 3)  No results for input(s): GLUCAP in the last 72 hours. Recent Results (from the past 240 hour(s))  MRSA PCR Screening     Status: None   Collection Time: 07/04/18  5:25 PM  Result Value Ref Range Status   MRSA by PCR NEGATIVE NEGATIVE Final    Comment:        The GeneXpert MRSA Assay (FDA approved for NASAL specimens only), is one component of a comprehensive MRSA colonization surveillance program. It is not intended to diagnose MRSA infection nor to guide or monitor treatment for MRSA infections. Performed at [facility], [address] [place], [place] [address]     Studies: No results found.  Scheduled Meds: . diclofenac sodium  4 g Topical QID  . feeding supplement  1 Container Oral TID BM  . furosemide  40 mg Intravenous BID  . heparin injection (subcutaneous)  5,000 Units Subcutaneous Q8H  . lidocaine  1 patch Transdermal Daily  . metoprolol tartrate  12.5 mg Oral BID  . mometasone-formoterol  2 puff Inhalation BID  . predniSONE  5 mg Oral Q breakfast  . sodium chloride flush  3 mL Intravenous Q12H  . vancomycin  500 mg Oral Q6H  . vancomycin (VANCOCIN) rectal ENEMA  500 mg Rectal Q6H   Continuous Infusions: . 0.45 % NaCl with KCl 20 mEq / L 100 mL/hr at 07/10/18 0539  . sodium chloride    . famotidine (PEPCID) IV Stopped (07/09/18 2314)  . metronidazole 500 mg (07/10/18 0959)    Principal Problem:   Megacolon due to infectious colitis Active Problems:   Essential hypertension   COPD (chronic obstructive pulmonary disease) (HCC)    Lumbar compression fracture (HCC)   AKI (acute kidney injury) (HCC)   Hypokalemia   Pseudomembranous colitis   Pressure injury of skin   Protein-calorie malnutrition, severe  Critical Care Time spent: 4 mins  Irwin Brakeman, MD, FAAFP [facility] Pager [phone]  If 7PM-7AM, please contact night-coverage [email or web address] Password [email or web address] 07/10/2018, 10:49 AM    LOS: 6 days

## 2018-07-11 LAB — CBC WITH DIFFERENTIAL/PLATELET
Abs Immature Granulocytes: 0.13 10*3/uL — ABNORMAL HIGH (ref 0.00–0.07)
Basophils Absolute: 0.1 10*3/uL (ref 0.0–0.1)
Basophils Relative: 1 %
Eosinophils Absolute: 0.3 10*3/uL (ref 0.0–0.5)
Eosinophils Relative: 4 %
HCT: 32.7 % — ABNORMAL LOW (ref 36.0–46.0)
Hemoglobin: 9.9 g/dL — ABNORMAL LOW (ref 12.0–15.0)
Immature Granulocytes: 2 %
Lymphocytes Relative: 10 %
Lymphs Abs: 0.7 10*3/uL (ref 0.7–4.0)
MCH: 30 pg (ref 26.0–34.0)
MCHC: 30.3 g/dL (ref 30.0–36.0)
MCV: 99.1 fL (ref 80.0–100.0)
Monocytes Absolute: 0.3 10*3/uL (ref 0.1–1.0)
Monocytes Relative: 5 %
Neutro Abs: 5.5 10*3/uL (ref 1.7–7.7)
Neutrophils Relative %: 78 %
Platelets: 178 10*3/uL (ref 150–400)
RBC: 3.3 MIL/uL — ABNORMAL LOW (ref 3.87–5.11)
RDW: 13.7 % (ref 11.5–15.5)
WBC: 7 10*3/uL (ref 4.0–10.5)
nRBC: 0 % (ref 0.0–0.2)

## 2018-07-11 LAB — RENAL FUNCTION PANEL
Albumin: 1.9 g/dL — ABNORMAL LOW (ref 3.5–5.0)
Anion gap: 7 (ref 5–15)
BUN: 26 mg/dL — ABNORMAL HIGH (ref 8–23)
CO2: 18 mmol/L — ABNORMAL LOW (ref 22–32)
Calcium: 7.4 mg/dL — ABNORMAL LOW (ref 8.9–10.3)
Chloride: 114 mmol/L — ABNORMAL HIGH (ref 98–111)
Creatinine, Ser: 1.57 mg/dL — ABNORMAL HIGH (ref 0.44–1.00)
GFR calc Af Amer: 37 mL/min — ABNORMAL LOW (ref >60)
GFR calc non Af Amer: 32 mL/min — ABNORMAL LOW (ref >60)
Glucose, Bld: 86 mg/dL (ref 70–99)
Phosphorus: 2.3 mg/dL — ABNORMAL LOW (ref 2.5–4.6)
Potassium: 2.9 mmol/L — ABNORMAL LOW (ref 3.5–5.1)
Sodium: 139 mmol/L (ref 135–145)

## 2018-07-11 LAB — MAGNESIUM: Magnesium: 1.4 mg/dL — ABNORMAL LOW (ref 1.7–2.4)

## 2018-07-11 MED ORDER — POTASSIUM CHLORIDE 10 MEQ/100ML IV SOLN
10.0000 meq | INTRAVENOUS | Status: AC
  Administered 2018-07-11 (×3): 10 meq via INTRAVENOUS
  Filled 2018-07-11 (×4): qty 100

## 2018-07-11 MED ORDER — MAGNESIUM SULFATE 4 GM/100ML IV SOLN
4.0000 g | Freq: Once | INTRAVENOUS | Status: AC
  Administered 2018-07-11: 4 g via INTRAVENOUS
  Filled 2018-07-11: qty 100

## 2018-07-11 MED ORDER — POTASSIUM CHLORIDE 10 MEQ/100ML IV SOLN
10.0000 meq | INTRAVENOUS | Status: DC
  Administered 2018-07-11 (×3): 10 meq via INTRAVENOUS
  Filled 2018-07-11 (×2): qty 100

## 2018-07-11 MED ORDER — METOPROLOL TARTRATE 25 MG PO TABS
25.0000 mg | ORAL_TABLET | Freq: Two times a day (BID) | ORAL | Status: DC
  Administered 2018-07-11 – 2018-07-19 (×17): 25 mg via ORAL
  Filled 2018-07-11 (×17): qty 1

## 2018-07-11 MED ORDER — SODIUM CHLORIDE 0.45 % IV SOLN
INTRAVENOUS | Status: DC
  Administered 2018-07-11 – 2018-07-12 (×2): via INTRAVENOUS
  Filled 2018-07-11 (×6): qty 1000

## 2018-07-11 NOTE — Progress Notes (Signed)
PROGRESS NOTE    Jordan [name]  HDQ:[id number]  DOB: 06-19-1946  DOA: 07/04/2018 PCP: Sinda Du, MD   Brief Admission Hx: 72 year old female with neuromuscular dysfunction of bladder and colon, chronic kidney disease stage III, hypertension, dyslipidemia, IBS, COPD, lumbar compression fracture with recent history of? C. Difficile colitis started on Flagyl 3-4 days prior to admission presented to the ED with worsening abdominal distention with no bowel movement or urination for 2 days.  MDM/Assessment & Plan:   1. Acute tachycardia- resolved now. Suspect was a recurrent brief episode of SVT.  Pt tolerating metoprolol 12.5 mg BID.  Increase to 25 mg BID for better BP control. Follow HR.   2. SVT -  Following lytes and replacing as needed.  Continue telemetry monitoring.  3. Megacolon due to infectious colitis - continue IV flagyl, oral and rectal vancomycin.  Appreciate GI and surgery assistance.  Plan to dc rectal tube soon as stools are slowing down considerably.  4. Hypokalemia /  Hypomagnesemia - IV replacement ordered, recheck in AM.   5. Prolonged QTc - Discontinued prozac/remerol.  Replacing lytes.  Follow.  6. AKI on CKD stage 3 - appreciate nephrology assistance.  Creatinine improving.  7. Essential hypertension - lisinopril on hold, started low dose metoprolol as above.  Follow.  8. Severe protein calorie malnutrition - dietitian consulted.  9. COPD - stable.  Resume home treatments.  10. Metabolic acidosis - secondary to CKD, appreciate assistance of renal team.  Following, improved from 11/1.    DVT prophylaxis: subcut heparin Code Status: Full  Family Communication: patient  Disposition Plan: continue inpatient treatments   Consultants:  GI  surgery  Antimicrobials:  Metronidazole 10/30  Vancomycin oral/rectal 10/30    Subjective: Pt says she is feeling a lot better today.  No abdominal pain.  No CP, No SOB.    Objective: Vitals:   07/11/18 0100  07/11/18 0300 07/11/18 0400 07/11/18 0500  BP: (!) 163/98 (!) 146/77    Pulse: 93     Resp: 20 17    Temp:   98.4 F (36.9 C)   TempSrc:   Oral   SpO2: 98%     Weight:    50.3 kg  Height:        Intake/Output Summary (Last 24 hours) at 07/11/2018 1194 Last data filed at 07/11/2018 0500 Gross per 24 hour  Intake 1734.26 ml  Output 4350 ml  Net -2615.74 ml   Filed Weights   07/04/18 0726 07/09/18 1355 07/11/18 0500  Weight: 49.9 kg 57.2 kg 50.3 kg   REVIEW OF SYSTEMS  As per history otherwise all reviewed and reported negative  Exam:  General exam: thin, chronically ill appearing, NAD. Flat affect.  Respiratory system: Clear. No increased work of breathing. Cardiovascular system: S1 & S2 heard, RRR. No JVD, murmurs, gallops, clicks or pedal edema. Gastrointestinal system: Abdomen is mildly distended, but soft and nontender. Normal bowel sounds heard. Central nervous system: Flat affect.  No focal neurological deficits. Extremities: no CCE.  Data Reviewed: Basic Metabolic Panel: Recent Labs  Lab 07/04/18 0729  07/06/18 0511 07/07/18 0547 07/08/18 0447 07/09/18 0501 07/10/18 0619  NA 135   < > 138 141 143 142 141  K 2.8*   < > 4.1 3.5 3.8 3.0* 3.4*  CL 105   < > 116* 116* 119* 122* 118*  CO2 17*   < > 15* 17* 17* 15* 18*  GLUCOSE 165*   < > 116* 114* 104* 107* 108*  BUN 76*   < > 79* 69* 56* 42* 32*  CREATININE 4.05*   < > 3.91* 3.14* 2.59* 2.02* 1.85*  CALCIUM 7.8*   < > 7.3* 7.5* 7.5* 7.2* 7.7*  MG 3.3*  --   --   --  3.0* 2.2 1.7  PHOS  --   --  2.9 2.1*  --  1.5* 1.8*   < > = values in this interval not displayed.   Liver Function Tests: Recent Labs  Lab 07/04/18 0729 07/06/18 0511 07/07/18 0547 07/09/18 0501 07/10/18 0619  AST 12*  --   --   --   --   ALT 15  --   --   --   --   ALKPHOS 69  --   --   --   --   BILITOT 0.7  --   --   --   --   PROT 6.2*  --   --   --   --   ALBUMIN 2.5* 1.9* 1.9* 1.8* 2.1*   Recent Labs  Lab 07/04/18 0729    LIPASE 24   No results for input(s): AMMONIA in the last 168 hours. CBC: Recent Labs  Lab 07/05/18 0635 07/07/18 0547 07/08/18 0447 07/09/18 0501 07/11/18 0430  WBC 6.5 4.3 5.0 6.1 7.0  NEUTROABS  --   --   --   --  PENDING  HGB 10.3* 9.6* 10.0* 10.1* 9.9*  HCT 34.5* 31.8* 33.3* 34.2* 32.7*  MCV 102.4* 102.6* 100.6* 101.5* 99.1  PLT 237 249 244 220 178   Cardiac Enzymes: No results for input(s): CKTOTAL, CKMB, CKMBINDEX, TROPONINI in the last 168 hours. CBG (last 3)  No results for input(s): GLUCAP in the last 72 hours. Recent Results (from the past 240 hour(s))  MRSA PCR Screening     Status: None   Collection Time: 07/04/18  5:25 PM  Result Value Ref Range Status   MRSA by PCR NEGATIVE NEGATIVE Final    Comment:        The GeneXpert MRSA Assay (FDA approved for NASAL specimens only), is one component of a comprehensive MRSA colonization surveillance program. It is not intended to diagnose MRSA infection nor to guide or monitor treatment for MRSA infections. Performed at [facility], [address] [place], [place] [address]     Studies: No results found.  Scheduled Meds: . diclofenac sodium  4 g Topical QID  . feeding supplement  1 Container Oral TID BM  . feeding supplement (PRO-STAT SUGAR FREE 64)  30 mL Oral BID  . furosemide  40 mg Intravenous BID  . heparin injection (subcutaneous)  5,000 Units Subcutaneous Q8H  . lidocaine  1 patch Transdermal Daily  . metoprolol tartrate  25 mg Oral BID  . mometasone-formoterol  2 puff Inhalation BID  . predniSONE  5 mg Oral Q breakfast  . sodium chloride flush  3 mL Intravenous Q12H  . vancomycin  500 mg Oral Q6H  . vancomycin (VANCOCIN) rectal ENEMA  500 mg Rectal Q6H   Continuous Infusions: . 0.45 % NaCl with KCl 20 mEq / L 100 mL/hr at 07/10/18 1643  . sodium chloride    . famotidine (PEPCID) IV Stopped (07/10/18 2143)  . metronidazole 100 mL/hr at 07/11/18 0447    Principal Problem:   Megacolon due to  infectious colitis Active Problems:   Essential hypertension   COPD (chronic obstructive pulmonary disease) (HCC)   Lumbar compression fracture (HCC)   AKI (acute kidney injury) ([place])  Hypokalemia   Pseudomembranous colitis   Pressure injury of skin   Protein-calorie malnutrition, severe   Paralytic ileus of large intestine [facility])  Critical Care Time spent: 83 mins  Irwin Brakeman, MD, FAAFP [facility] Pager [phone]  If 7PM-7AM, please contact night-coverage [email or web address] Password [email or web address] 07/11/2018, 6:28 AM    LOS: 7 days

## 2018-07-11 NOTE — Progress Notes (Signed)
5 Days Post-Op  Subjective: Events of yesterday noted.  This morning, patient has no abdominal pain.  She is alert and oriented.  Objective: Vital signs in last 24 hours: Temp:  [97.9 F (36.6 C)-98.4 F (36.9 C)] 98.3 F (36.8 C) (11/03 0801) Pulse Rate:  [66-95] 93 (11/03 0100) Resp:  [15-20] 20 (11/03 0900) BP: (126-166)/(68-100) 143/89 (11/03 0900) SpO2:  [96 %-100 %] 96 % (11/03 0746) Weight:  [50.3 kg] 50.3 kg (11/03 0500) Last BM Date: 07/11/18  Intake/Output from previous day: 11/02 0701 - 11/03 0700 In: 1734.3 [I.V.:1026.7; IV Piggyback:607.6] Out: 3350 [Urine:3350] Intake/Output this shift: Total I/O In: 200 [IV Piggyback:200] Out: -   General appearance: alert, cooperative and no distress GI: Distended but soft.  No tenderness noted.  Bowel sounds active.  No rigidity noted.  Lab Results:  Recent Labs    07/09/18 0501 07/11/18 0430  WBC 6.1 7.0  HGB 10.1* 9.9*  HCT 34.2* 32.7*  PLT 220 178   BMET Recent Labs    07/10/18 0619 07/11/18 0430  NA 141 139  K 3.4* 2.9*  CL 118* 114*  CO2 18* 18*  GLUCOSE 108* 86  BUN 32* 26*  CREATININE 1.85* 1.57*  CALCIUM 7.7* 7.4*   PT/INR No results for input(s): LABPROT, INR in the last 72 hours.  Studies/Results: No results found.  Anti-infectives: Anti-infectives (From admission, onward)   Start     Dose/Rate Route Frequency Ordered Stop   07/07/18 1000  metroNIDAZOLE (FLAGYL) IVPB 500 mg     500 mg 100 mL/hr over 60 Minutes Intravenous Every 8 hours 07/07/18 0928     07/06/18 1800  vancomycin (VANCOCIN) 50 mg/mL oral solution 500 mg     500 mg Oral Every 6 hours 07/06/18 1248     07/06/18 1300  vancomycin (VANCOCIN) 500 mg in sodium chloride irrigation 0.9 % 100 mL ENEMA     500 mg Rectal Every 6 hours 07/06/18 1248 07/20/18 1559   07/05/18 1800  vancomycin (VANCOCIN) 125 MG capsule 125 mg  Status:  Discontinued     125 mg Oral 4 times daily 07/05/18 1659 07/05/18 1702   07/05/18 1800  vancomycin  (VANCOCIN) 50 mg/mL oral solution 125 mg  Status:  Discontinued     125 mg Oral Every 6 hours 07/05/18 1702 07/06/18 1249      Assessment/Plan: Impression: Megacolon secondary to C. difficile colitis, resolving.  No evidence of an acute abdomen. Plan: May advance diet as tolerated.  Replenish potassium as able.  Will follow peripherally with you.  LOS: 7 days    Aviva Signs 07/11/2018

## 2018-07-11 NOTE — Progress Notes (Signed)
Subjective: Interval History: Patient offers no complaints.  No abdominal pain.  She denies also any nausea or vomiting.  Objective: Vital signs in last 24 hours: Temp:  [97.9 F (36.6 C)-98.4 F (36.9 C)] 98.3 F (36.8 C) (11/03 0801) Pulse Rate:  [66-95] 93 (11/03 0100) Resp:  [15-20] 15 (11/03 0600) BP: (66-165)/(57-100) 151/77 (11/03 0600) SpO2:  [96 %-100 %] 96 % (11/03 0746) Weight:  [50.3 kg] 50.3 kg (11/03 0500) Weight change: -6.853 kg  Intake/Output from previous day: 11/02 0701 - 11/03 0700 In: 1734.3 [I.V.:1026.7; IV Piggyback:607.6] Out: 3350 [Urine:3350] Intake/Output this shift: No intake/output data recorded.  Patient is alert and in no apparent distress. Chest is clear to auscultation Heart exam revealed regular rate and rhythm Abdomen: Distended, tender and positive bowel sound Extremities she has no edema  Lab Results: Recent Labs    07/09/18 0501 07/11/18 0430  WBC 6.1 7.0  HGB 10.1* 9.9*  HCT 34.2* 32.7*  PLT 220 178   BMET:  Recent Labs    07/10/18 0619 07/11/18 0430  NA 141 139  K 3.4* 2.9*  CL 118* 114*  CO2 18* 18*  GLUCOSE 108* 86  BUN 32* 26*  CREATININE 1.85* 1.57*  CALCIUM 7.7* 7.4*   No results for input(s): PTH in the last 72 hours. Iron Studies: No results for input(s): IRON, TIBC, TRANSFERRIN, FERRITIN in the last 72 hours.  Studies/Results: No results found.  I have reviewed the patient's current medications.  Assessment/Plan: 1] acute kidney injury superimposed on chronic.  Her renal function continue to improve.  Presently her creatinine has returned to her baseline. 2] anemia: Her hemoglobin remains within our target goal 3] ileus: Her abdominal distention is getting better.  She denies any nausea or vomiting.  No abdominal pain. 4] bone and mineral disorder: Her calcium is in range.  Phosphorus is improving. 5] fluid management: Presently patient is asymptomatic. She has 3300 cc of urine output. 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  Her potassium has declined further. Plan: 1] we will IV fluid to half-normal saline with 40 mEq of KCl at 100 cc/h after completing his 10 mEq potassium IV run 2] we will check a renal panel in the morning.   LOS: 7 days   Irvin Lizama S 07/11/2018,9:19 AM

## 2018-07-12 LAB — RENAL FUNCTION PANEL
Albumin: 2 g/dL — ABNORMAL LOW (ref 3.5–5.0)
Anion gap: 3 — ABNORMAL LOW (ref 5–15)
BUN: 24 mg/dL — ABNORMAL HIGH (ref 8–23)
CO2: 20 mmol/L — ABNORMAL LOW (ref 22–32)
Calcium: 7.5 mg/dL — ABNORMAL LOW (ref 8.9–10.3)
Chloride: 113 mmol/L — ABNORMAL HIGH (ref 98–111)
Creatinine, Ser: 1.52 mg/dL — ABNORMAL HIGH (ref 0.44–1.00)
GFR calc Af Amer: 38 mL/min — ABNORMAL LOW (ref >60)
GFR calc non Af Amer: 33 mL/min — ABNORMAL LOW (ref >60)
Glucose, Bld: 90 mg/dL (ref 70–99)
Phosphorus: 2.5 mg/dL (ref 2.5–4.6)
Potassium: 3.9 mmol/L (ref 3.5–5.1)
Sodium: 136 mmol/L (ref 135–145)

## 2018-07-12 LAB — CBC WITH DIFFERENTIAL/PLATELET
Abs Immature Granulocytes: 0.12 10*3/uL — ABNORMAL HIGH (ref 0.00–0.07)
Basophils Absolute: 0.1 10*3/uL (ref 0.0–0.1)
Basophils Relative: 1 %
Eosinophils Absolute: 0.2 10*3/uL (ref 0.0–0.5)
Eosinophils Relative: 4 %
HCT: 32.4 % — ABNORMAL LOW (ref 36.0–46.0)
Hemoglobin: 9.8 g/dL — ABNORMAL LOW (ref 12.0–15.0)
Immature Granulocytes: 2 %
Lymphocytes Relative: 12 %
Lymphs Abs: 0.8 10*3/uL (ref 0.7–4.0)
MCH: 30.2 pg (ref 26.0–34.0)
MCHC: 30.2 g/dL (ref 30.0–36.0)
MCV: 99.7 fL (ref 80.0–100.0)
Monocytes Absolute: 0.2 10*3/uL (ref 0.1–1.0)
Monocytes Relative: 3 %
Neutro Abs: 5 10*3/uL (ref 1.7–7.7)
Neutrophils Relative %: 78 %
Platelets: 159 10*3/uL (ref 150–400)
RBC: 3.25 MIL/uL — ABNORMAL LOW (ref 3.87–5.11)
RDW: 13.6 % (ref 11.5–15.5)
WBC: 6.4 10*3/uL (ref 4.0–10.5)
nRBC: 0 % (ref 0.0–0.2)

## 2018-07-12 LAB — MAGNESIUM: Magnesium: 2.2 mg/dL (ref 1.7–2.4)

## 2018-07-12 MED ORDER — SODIUM CHLORIDE 0.9 % IV SOLN
INTRAVENOUS | Status: DC
  Administered 2018-07-12: 10:00:00 via INTRAVENOUS

## 2018-07-12 MED ORDER — HYDROCODONE-ACETAMINOPHEN 5-325 MG PO TABS
1.0000 | ORAL_TABLET | ORAL | Status: DC | PRN
  Administered 2018-07-12 – 2018-07-13 (×3): 1 via ORAL
  Filled 2018-07-12 (×3): qty 1

## 2018-07-12 MED ORDER — ENSURE ENLIVE PO LIQD
237.0000 mL | Freq: Three times a day (TID) | ORAL | Status: DC
  Administered 2018-07-12 – 2018-07-14 (×2): 237 mL via ORAL

## 2018-07-12 MED ORDER — FAMOTIDINE 20 MG PO TABS
20.0000 mg | ORAL_TABLET | Freq: Every day | ORAL | Status: DC
  Administered 2018-07-13 – 2018-07-19 (×7): 20 mg via ORAL
  Filled 2018-07-12 (×7): qty 1

## 2018-07-12 MED ORDER — TRAMADOL HCL 50 MG PO TABS
50.0000 mg | ORAL_TABLET | Freq: Four times a day (QID) | ORAL | Status: DC | PRN
  Administered 2018-07-12: 50 mg via ORAL
  Filled 2018-07-12: qty 1

## 2018-07-12 NOTE — Progress Notes (Signed)
Subjective: Interval History: Patient is feeling good.  She did not have any nausea or vomiting.  She seems to be tolerating clear liquid diet.  Objective: Vital signs in last 24 hours: Temp:  [97.5 F (36.4 C)-98.9 F (37.2 C)] 97.5 F (36.4 C) (11/04 0612) Pulse Rate:  [65-98] 77 (11/04 0612) Resp:  [15-22] 15 (11/04 0612) BP: (117-147)/(67-90) 144/81 (11/04 0612) SpO2:  [96 %-100 %] 100 % (11/04 0612) Weight change:   Intake/Output from previous day: 11/03 0701 - 11/04 0700 In: 2014.1 [P.O.:480; I.V.:303.7; IV Piggyback:1130.4] Out: 2475 [Urine:2100; Stool:375] Intake/Output this shift: No intake/output data recorded.  Patient is alert and in no apparent distress. Chest is clear to auscultation Heart exam revealed regular rate and rhythm Abdomen: Distended, tender and positive bowel sound Extremities she has no edema  Lab Results: Recent Labs    07/11/18 0430 07/12/18 0442  WBC 7.0 6.4  HGB 9.9* 9.8*  HCT 32.7* 32.4*  PLT 178 159   BMET:  Recent Labs    07/11/18 0430 07/12/18 0442  NA 139 136  K 2.9* 3.9  CL 114* 113*  CO2 18* 20*  GLUCOSE 86 90  BUN 26* 24*  CREATININE 1.57* 1.52*  CALCIUM 7.4* 7.5*   No results for input(s): PTH in the last 72 hours. Iron Studies: No results for input(s): IRON, TIBC, TRANSFERRIN, FERRITIN in the last 72 hours.  Studies/Results: No results found.  I have reviewed the patient's current medications.  Assessment/Plan: 1] acute kidney injury superimposed on chronic.  Her renal function continue to improve.  Patient is presently asymptomatic. 2] anemia: Her hemoglobin remains within our target goal.  Her hemoglobin remains stable. 3] ileus: Her abdominal distention is getting better. 4] bone and mineral disorder: Her calcium is in range.  Phosphorus is improving. 5] fluid management: Denies any difficulty breathing.  She had about 2400 cc of urine output. 6] hypertension: Her blood pressure is reasonably controlled 7]  neurogenic bladder 8] hypokalemia: Her potassium is low.  Her potassium 3.9 normal.  Patient is getting potassium with her IV fluid. Plan: 1] we will change IV fluid to normal saline at 50 cc/h 2] we will check a renal panel in the morning.   LOS: 8 days   Jordan [name] S 07/12/2018,9:03 AM

## 2018-07-12 NOTE — Progress Notes (Signed)
PROGRESS NOTE    Jordan [name]  QZR:[id number]  DOB: [birth year]  DOA: 07/04/2018 PCP: Sinda Du, MD   Brief Admission Hx: 72 year old female with neuromuscular dysfunction of bladder and colon, chronic kidney disease stage III, hypertension, dyslipidemia, IBS, COPD, lumbar compression fracture with recent history of? C. Difficile colitis started on Flagyl 3-4 days prior to admission presented to the ED with worsening abdominal distention with no bowel movement or urination for 2 days.  MDM/Assessment & Plan:   1. SVT- resolved now. Suspect was a recurrent brief episode of SVT.  Appears to be stable on metoprolol 25 mg twice daily. Follow HR.   2. Megacolon due to infectious colitis - continue IV flagyl, oral and rectal vancomycin.  Appreciate GI and surgery assistance.  Plan to dc rectal tube soon as stools are slowing down considerably.  Tolerating full liquids, possibly solid foods tomorrow 3. Hypokalemia /  Hypomagnesemia -improved with IV replacement.   4. Prolonged QTc - Discontinued prozac/remerol.  Replacing lytes.  Follow.  5. AKI on CKD stage 3 - appreciate nephrology assistance.  Creatinine improving.  Currently on IV fluids, but she is developing some edema.  Will hold further IV fluids 6. Essential hypertension - lisinopril on hold, started low dose metoprolol as above.  Follow.  7. Severe protein calorie malnutrition - dietitian consulted.  8. COPD - stable.  Resume home treatments.  9. Metabolic acidosis - secondary to CKD, appreciate assistance of renal team.  Following, improved from 11/1.    DVT prophylaxis: subcut heparin Code Status: Full  Family Communication: patient  Disposition Plan: continue inpatient treatments   Consultants:  GI  surgery  Antimicrobials:  Metronidazole 10/30  Vancomycin oral/rectal 10/30    Subjective: Complains of back pain, feels it is due to laying in bed.  No nausea or vomiting.  Objective: Vitals:   07/11/18 2120  07/12/18 0612 07/12/18 1014 07/12/18 1412  BP: (!) 147/81 (!) 144/81  121/69  Pulse: 85 77  62  Resp: 16 15  18   Temp: 98.2 F (36.8 C) (!) 97.5 F (36.4 C)  97.8 F (36.6 C)  TempSrc: Oral Oral  Oral  SpO2: 100% 100% 98% 100%  Weight:      Height:        Intake/Output Summary (Last 24 hours) at 07/12/2018 1912 Last data filed at 07/12/2018 1850 Gross per 24 hour  Intake 2041.52 ml  Output 2075 ml  Net -33.48 ml   Filed Weights   07/04/18 0726 07/09/18 1355 07/11/18 0500  Weight: 49.9 kg 57.2 kg 50.3 kg   REVIEW OF SYSTEMS  As per history otherwise all reviewed and reported negative  Exam:  General exam: Alert, awake, oriented x 3 Respiratory system: Clear to auscultation. Respiratory effort normal. Cardiovascular system:RRR. No murmurs, rubs, gallops. Gastrointestinal system: Abdomen is nondistended, soft and nontender. No organomegaly or masses felt. Normal bowel sounds heard.  Rectal tube in place Central nervous system: Alert and oriented. No focal neurological deficits. Extremities: 1+ edema bilaterally Skin: No rashes, lesions or ulcers Psychiatry: Judgement and insight appear normal. Mood & affect appropriate.    Data Reviewed: Basic Metabolic Panel: Recent Labs  Lab 07/07/18 0547 07/08/18 0447 07/09/18 0501 07/10/18 0619 07/11/18 0430 07/12/18 0442  NA 141 143 142 141 139 136  K 3.5 3.8 3.0* 3.4* 2.9* 3.9  CL 116* 119* 122* 118* 114* 113*  CO2 17* 17* 15* 18* 18* 20*  GLUCOSE 114* 104* 107* 108* 86 90  BUN 69* 56* 42*  32* 26* 24*  CREATININE 3.14* 2.59* 2.02* 1.85* 1.57* 1.52*  CALCIUM 7.5* 7.5* 7.2* 7.7* 7.4* 7.5*  MG  --  3.0* 2.2 1.7 1.4* 2.2  PHOS 2.1*  --  1.5* 1.8* 2.3* 2.5   Liver Function Tests: Recent Labs  Lab 07/07/18 0547 07/09/18 0501 07/10/18 0619 07/11/18 0430 07/12/18 0442  ALBUMIN 1.9* 1.8* 2.1* 1.9* 2.0*   No results for input(s): LIPASE, AMYLASE in the last 168 hours. No results for input(s): AMMONIA in the last 168  hours. CBC: Recent Labs  Lab 07/07/18 0547 07/08/18 0447 07/09/18 0501 07/11/18 0430 07/12/18 0442  WBC 4.3 5.0 6.1 7.0 6.4  NEUTROABS  --   --   --  5.5 5.0  HGB 9.6* 10.0* 10.1* 9.9* 9.8*  HCT 31.8* 33.3* 34.2* 32.7* 32.4*  MCV 102.6* 100.6* 101.5* 99.1 99.7  PLT 249 244 220 178 159   Cardiac Enzymes: No results for input(s): CKTOTAL, CKMB, CKMBINDEX, TROPONINI in the last 168 hours. CBG (last 3)  No results for input(s): GLUCAP in the last 72 hours. Recent Results (from the past 240 hour(s))  MRSA PCR Screening     Status: None   Collection Time: 07/04/18  5:25 PM  Result Value Ref Range Status   MRSA by PCR NEGATIVE NEGATIVE Final    Comment:        The GeneXpert MRSA Assay (FDA approved for NASAL specimens only), is one component of a comprehensive MRSA colonization surveillance program. It is not intended to diagnose MRSA infection nor to guide or monitor treatment for MRSA infections. Performed at [facility], [address], [place], [place] [address]     Studies: No results found.  Scheduled Meds: . diclofenac sodium  4 g Topical QID  . [START ON 07/13/2018] famotidine  20 mg Oral QAC breakfast  . feeding supplement (ENSURE ENLIVE)  237 mL Oral TID BM  . feeding supplement (PRO-STAT SUGAR FREE 64)  30 mL Oral BID  . heparin injection (subcutaneous)  5,000 Units Subcutaneous Q8H  . lidocaine  1 patch Transdermal Daily  . metoprolol tartrate  25 mg Oral BID  . mometasone-formoterol  2 puff Inhalation BID  . predniSONE  5 mg Oral Q breakfast  . sodium chloride flush  3 mL Intravenous Q12H  . vancomycin  500 mg Oral Q6H  . vancomycin (VANCOCIN) rectal ENEMA  500 mg Rectal Q6H   Continuous Infusions: . sodium chloride    . sodium chloride 50 mL/hr at 07/12/18 1850  . metronidazole Stopped (07/12/18 1736)    Principal Problem:   Megacolon due to infectious colitis Active Problems:   Essential hypertension   COPD (chronic obstructive pulmonary  disease) (HCC)   Lumbar compression fracture (HCC)   AKI (acute kidney injury) (HCC)   Hypokalemia   Pseudomembranous colitis   Pressure injury of skin   Protein-calorie malnutrition, severe   Paralytic ileus of large intestine ([place])  Time spent: 30 mins  Kathie Dike, MD [facility] Pager [phone]  If 7PM-7AM, please contact night-coverage [email or web address] Password [email or web address] 07/12/2018, 7:12 PM    LOS: 8 days

## 2018-07-12 NOTE — Progress Notes (Signed)
    Subjective: Abdomen less distended. Feels "good". No N/V. Tolerating clear liquids. Documented rectal output decreasing.   Objective: Vital signs in last 24 hours: Temp:  [97.5 F (36.4 C)-98.9 F (37.2 C)] 97.5 F (36.4 C) (11/04 0612) Pulse Rate:  [65-98] 77 (11/04 0612) Resp:  [15-22] 15 (11/04 0612) BP: (117-158)/(67-90) 144/81 (11/04 0612) SpO2:  [96 %-100 %] 100 % (11/04 0612) Last BM Date: 07/11/18 General:   Alert and oriented, pleasant, chronically-ill appearing Abdomen:  Bowel sounds present, marked improvement in distension from prior exam but still moderately distended, soft, no TTP Extremities:  Without edema. Neurologic:  Alert and  oriented x4 Psych:  . Normal mood and affect.  Intake/Output from previous day: 11/03 0701 - 11/04 0700 In: 2014.1 [P.O.:480; I.V.:303.7; IV Piggyback:1130.4] Out: 2475 [Urine:2100; Stool:375] Intake/Output this shift: No intake/output data recorded.  Lab Results: Recent Labs    07/11/18 0430 07/12/18 0442  WBC 7.0 6.4  HGB 9.9* 9.8*  HCT 32.7* 32.4*  PLT 178 159   BMET Recent Labs    07/10/18 0619 07/11/18 0430 07/12/18 0442  NA 141 139 136  K 3.4* 2.9* 3.9  CL 118* 114* 113*  CO2 18* 18* 20*  GLUCOSE 108* 86 90  BUN 32* 26* 24*  CREATININE 1.85* 1.57* 1.52*  CALCIUM 7.7* 7.4* 7.5*   LFT Recent Labs    07/10/18 0619 07/11/18 0430 07/12/18 0442  ALBUMIN 2.1* 1.9* 2.0*      Assessment: 72 year old female admitted with megacolon in setting of CDI, continuing to clinically improve. Abdomen distended but soft and much improved from when last assessed. Due to fulminant presentation, continue oral vanc, IV Flagyl, enemas for total of 10 days. Evening of November 7th will be total of 10 days with this combined therapy. If declining or failing to progress, could extend to 14 total. As she is continuing to improve, anticipate she will complete this treatment 11/7.    Plan: Continue oral vanc, vanc enemas, IV  Flagyl Advance diet to full liquids, may be able to advance as tolerated to soft by tomorrow Appreciate Surgery input Continue with nutrition recommendations Will continue to follow   Annitta Needs, PhD, ANP-BC [facility]    LOS: 8 days    07/12/2018, 7:58 AM

## 2018-07-13 LAB — RENAL FUNCTION PANEL
Albumin: 1.9 g/dL — ABNORMAL LOW (ref 3.5–5.0)
Anion gap: 6 (ref 5–15)
BUN: 23 mg/dL (ref 8–23)
CO2: 16 mmol/L — ABNORMAL LOW (ref 22–32)
Calcium: 7.6 mg/dL — ABNORMAL LOW (ref 8.9–10.3)
Chloride: 115 mmol/L — ABNORMAL HIGH (ref 98–111)
Creatinine, Ser: 1.29 mg/dL — ABNORMAL HIGH (ref 0.44–1.00)
GFR calc Af Amer: 47 mL/min — ABNORMAL LOW (ref >60)
GFR calc non Af Amer: 40 mL/min — ABNORMAL LOW (ref >60)
Glucose, Bld: 96 mg/dL (ref 70–99)
Phosphorus: 3.5 mg/dL (ref 2.5–4.6)
Potassium: 3.9 mmol/L (ref 3.5–5.1)
Sodium: 137 mmol/L (ref 135–145)

## 2018-07-13 LAB — CBC WITH DIFFERENTIAL/PLATELET
Abs Immature Granulocytes: 0.1 10*3/uL — ABNORMAL HIGH (ref 0.00–0.07)
Basophils Absolute: 0.1 10*3/uL (ref 0.0–0.1)
Basophils Relative: 1 %
Eosinophils Absolute: 0.2 10*3/uL (ref 0.0–0.5)
Eosinophils Relative: 3 %
HCT: 31.9 % — ABNORMAL LOW (ref 36.0–46.0)
Hemoglobin: 9.3 g/dL — ABNORMAL LOW (ref 12.0–15.0)
Immature Granulocytes: 2 %
Lymphocytes Relative: 11 %
Lymphs Abs: 0.7 10*3/uL (ref 0.7–4.0)
MCH: 29.8 pg (ref 26.0–34.0)
MCHC: 29.2 g/dL — ABNORMAL LOW (ref 30.0–36.0)
MCV: 102.2 fL — ABNORMAL HIGH (ref 80.0–100.0)
Monocytes Absolute: 0.2 10*3/uL (ref 0.1–1.0)
Monocytes Relative: 4 %
Neutro Abs: 4.9 10*3/uL (ref 1.7–7.7)
Neutrophils Relative %: 79 %
Platelets: 148 10*3/uL — ABNORMAL LOW (ref 150–400)
RBC: 3.12 MIL/uL — ABNORMAL LOW (ref 3.87–5.11)
RDW: 13.7 % (ref 11.5–15.5)
WBC: 6.1 10*3/uL (ref 4.0–10.5)
nRBC: 0 % (ref 0.0–0.2)

## 2018-07-13 LAB — MAGNESIUM: Magnesium: 1.9 mg/dL (ref 1.7–2.4)

## 2018-07-13 MED ORDER — SODIUM BICARBONATE 650 MG PO TABS
650.0000 mg | ORAL_TABLET | Freq: Three times a day (TID) | ORAL | Status: DC
  Administered 2018-07-13 – 2018-07-19 (×19): 650 mg via ORAL
  Filled 2018-07-13 (×19): qty 1

## 2018-07-13 MED ORDER — GABAPENTIN 400 MG PO CAPS
400.0000 mg | ORAL_CAPSULE | Freq: Two times a day (BID) | ORAL | Status: DC
  Administered 2018-07-13 – 2018-07-19 (×13): 400 mg via ORAL
  Filled 2018-07-13 (×13): qty 1

## 2018-07-13 MED ORDER — FLUOXETINE HCL 20 MG PO CAPS
40.0000 mg | ORAL_CAPSULE | Freq: Every day | ORAL | Status: DC
Start: 2018-07-13 — End: 2018-07-19
  Administered 2018-07-13 – 2018-07-19 (×7): 40 mg via ORAL
  Filled 2018-07-13 (×7): qty 2

## 2018-07-13 MED ORDER — MIRTAZAPINE 15 MG PO TABS
15.0000 mg | ORAL_TABLET | Freq: Every day | ORAL | Status: DC
  Administered 2018-07-13 – 2018-07-18 (×6): 15 mg via ORAL
  Filled 2018-07-13 (×6): qty 1

## 2018-07-13 NOTE — Evaluation (Signed)
Physical Therapy Evaluation Patient Details Name: JANIFER GIESELMAN MRN: [id number] DOB: [birth year] Today's Date: 07/13/2018   History of Present Illness  TAJE TONDREAU is a 72 y.o. female with medical history significant for lumbar compression fracture, neuromuscular dysfunction of bladder and colon, CKD stage III, hypertension, dyslipidemia, IBS, COPD, and questionable recent history of C. difficile colitis with initiation of Flagyl approximately 3 to 4 days ago, who presented to the ED with worsening abdominal distention and apparently no bowel movement or urination since Friday of last week.  According to SNF notes, she was started on Flagyl for C. difficile on 10/24 at the facility.  The patient is overall a poor historian, but appears to state that she did not have any significant diarrhea or abdominal pain at that time.  She is noted to have some ongoing nausea but no vomiting and has maintained a poor appetite with poor oral intake.  She states that she has been drinking plenty of water, but continues to remain very thirsty.  She denies any bloody bowel movements, fever, chills, chest pain, or shortness of breath.    Clinical Impression  Patient presents with mild bilateral plantar flexor contractures with -10 degrees bilateral ankle dorsiflexion, c/o severe pain right knee when attempting sit to stands which is baseline per patient, wheelchair bound at SNF, and nonambulatory for nearly a year per patient.  Patient functioning at baseline for functional mobility requiring Max assist for bed mobility and total assist for transfers.  Plan:  Patient discharged from physical therapy to care of nursing for out of bed to chair daily as tolerated with use of mechanical lift if necessary for length of stay.    Follow Up Recommendations SNF;Supervision for mobility/OOB;Supervision/Assistance - 24 hour    Equipment Recommendations  None recommended by PT    Recommendations for Other Services        Precautions / Restrictions Precautions Precautions: Fall Restrictions Weight Bearing Restrictions: No      Mobility  Bed Mobility Overal bed mobility: Needs Assistance Bed Mobility: Supine to Sit;Sit to Supine     Supine to sit: Max assist Sit to supine: Max assist   General bed mobility comments: required much verbal/tactile assistance  Transfers Overall transfer level: Needs assistance Equipment used: Rolling walker (2 wheeled) Transfers: Sit to/from Stand Sit to Stand: Max assist;Total assist         General transfer comment: unable to maintain standing balance using RW  Ambulation/Gait                Stairs            Wheelchair Mobility    Modified Rankin (Stroke Patients Only)       Balance Overall balance assessment: Needs assistance Sitting-balance support: Feet supported;Bilateral upper extremity supported Sitting balance-Leahy Scale: Poor Sitting balance - Comments: fair/poor using BUE, frequent leaning backwards   Standing balance support: During functional activity;Bilateral upper extremity supported Standing balance-Leahy Scale: Poor Standing balance comment: unable to maintain standing balance using RW                             Pertinent Vitals/Pain Pain Assessment: No/denies pain    Home Living Family/patient expects to be discharged to:: Skilled nursing facility                      Prior Function Level of Independence: Needs assistance   Gait / Transfers Assistance Needed:  assisted transfers to wheelchair  ADL's / Homemaking Assistance Needed: assisted by SNF staff        Hand Dominance        Extremity/Trunk Assessment   Upper Extremity Assessment Upper Extremity Assessment: Generalized weakness    Lower Extremity Assessment Lower Extremity Assessment: Generalized weakness    Cervical / Trunk Assessment Cervical / Trunk Assessment: Normal  Communication   Communication: No  difficulties  Cognition Arousal/Alertness: Awake/alert Behavior During Therapy: WFL for tasks assessed/performed Overall Cognitive Status: Within Functional Limits for tasks assessed                                 General Comments: patient had poor memory for prior functional status      General Comments      Exercises     Assessment/Plan    PT Assessment Patent does not need any further PT services  PT Problem List         PT Treatment Interventions      PT Goals (Current goals can be found in the Care Plan section)  Acute Rehab PT Goals Patient Stated Goal: return to SNF PT Goal Formulation: With patient Time For Goal Achievement: 07/13/18 Potential to Achieve Goals: Good    Frequency     Barriers to discharge        Co-evaluation               AM-PAC PT "6 Clicks" Daily Activity  Outcome Measure Difficulty turning over in bed (including adjusting bedclothes, sheets and blankets)?: Unable Difficulty moving from lying on back to sitting on the side of the bed? : Unable Difficulty sitting down on and standing up from a chair with arms (e.g., wheelchair, bedside commode, etc,.)?: Unable Help needed moving to and from a bed to chair (including a wheelchair)?: A Lot Help needed walking in hospital room?: Total Help needed climbing 3-5 steps with a railing? : Total 6 Click Score: 7    End of Session   Activity Tolerance: Patient limited by fatigue Patient left: in bed;with call bell/phone within reach;with bed alarm set Nurse Communication: Mobility status PT Visit Diagnosis: Unsteadiness on feet (R26.81);Other abnormalities of gait and mobility (R26.89);Muscle weakness (generalized) (M62.81)    Time: 0388-8280 PT Time Calculation (min) (ACUTE ONLY): 30 min   Charges:   PT Evaluation $PT Eval Moderate Complexity: 1 Mod PT Treatments $Therapeutic Activity: 23-37 mins        2:02 PM, 07/13/18 Lonell Grandchild, MPT Physical Therapist  with [facility] [phone] office [phone] mobile phone

## 2018-07-13 NOTE — Progress Notes (Signed)
Pt refused Heparin subq injection.  Pt educated on the risks of not taking the medication.  Pt verbalized understanding.

## 2018-07-13 NOTE — Progress Notes (Signed)
    Subjective: Would like to advance diet. No N/V. Wants to eat pasta. Still with liquid stool. Abdominal distension improving.   Objective: Vital signs in last 24 hours: Temp:  [97.6 F (36.4 C)-97.8 F (36.6 C)] 97.6 F (36.4 C) (11/05 0506) Pulse Rate:  [62-65] 65 (11/05 0506) Resp:  [16-18] 16 (11/05 0506) BP: (121-154)/(69-93) 148/93 (11/05 0506) SpO2:  [98 %-100 %] 99 % (11/05 0506) Last BM Date: 07/12/18 General:   Alert and oriented, pleasant Head:  Normocephalic and atraumatic. Abdomen:  Bowel sounds present, distension improving, soft, no TTP, no rebound or guarding Neurologic:  Alert and  oriented x4 Psych:  Alert and cooperative. Normal mood and affect.  Intake/Output from previous day: 11/04 0701 - 11/05 0700 In: 1580.3 [P.O.:480; I.V.:600.3; IV Piggyback:300] Out: 3833 [Urine:1100; Stool:575] Intake/Output this shift: No intake/output data recorded.  Lab Results: Recent Labs    07/11/18 0430 07/12/18 0442 07/13/18 0658  WBC 7.0 6.4 6.1  HGB 9.9* 9.8* 9.3*  HCT 32.7* 32.4* 31.9*  PLT 178 159 148*   BMET Recent Labs    07/11/18 0430 07/12/18 0442 07/13/18 0448  NA 139 136 137  K 2.9* 3.9 3.9  CL 114* 113* 115*  CO2 18* 20* 16*  GLUCOSE 86 90 96  BUN 26* 24* 23  CREATININE 1.57* 1.52* 1.29*  CALCIUM 7.4* 7.5* 7.6*   LFT Recent Labs    07/11/18 0430 07/12/18 0442 07/13/18 0448  ALBUMIN 1.9* 2.0* 1.9*    Assessment: 72 year old female admitted with megacolon in setting of CDI, continuing to clinically improve. Abdominal distension continues to improve. Flexi-seal remains in place.   Vanc oral/per rectum started 10/29, Flagyl started 10/30, complete therapy for 14 days.   Appreciate PT consult.    Plan: Advance to soft diet Continue vanc oral and per rectum Continue IV Flagyl Continue with PT consult Will continue to follow   Annitta Needs, PhD, ANP-BC [facility]     LOS: 9 days    07/13/2018, 8:04  AM

## 2018-07-13 NOTE — Progress Notes (Signed)
PROGRESS NOTE    Jordan [name]  XBJ:[id number]  DOB: [birth year]  DOA: 07/04/2018 PCP: Sinda Du, MD   Brief Admission Hx: 72 year old female with neuromuscular dysfunction of bladder and colon, chronic kidney disease stage III, hypertension, dyslipidemia, IBS, COPD, lumbar compression fracture with recent history of? C. Difficile colitis started on Flagyl 3-4 days prior to admission presented to the ED with worsening abdominal distention with no bowel movement or urination for 2 days.  MDM/Assessment & Plan:   1. SVT- resolved now. Appears to be stable on metoprolol 25 mg twice daily. Follow HR.   2. Megacolon due to infectious colitis - continue IV flagyl, oral and rectal vancomycin.  Appreciate GI and surgery assistance.  Plan to dc rectal tube soon as stools are slowing down considerably.  Stools are still very loose and liquid.  She is tolerating full liquids and will be advanced to solid foods today. 3. Hypokalemia /  Hypomagnesemia -improved with IV replacement.   4. Prolonged QTc - Discontinued prozac/remerol.  Replacing lytes.  Follow.  5. AKI on CKD stage 3 - appreciate nephrology assistance.  Creatinine improving.  IV fluids discontinued 6. Essential hypertension - lisinopril on hold, started low dose metoprolol as above.  Blood pressure stable.  Follow.  7. Severe protein calorie malnutrition - dietitian consulted.  8. COPD - stable.  Resume home treatments.  9. Metabolic acidosis -suspect related to diarrhea and GI losses.  Will start on oral bicarbonate replacement.    DVT prophylaxis: subcut heparin Code Status: Full  Family Communication: patient  Disposition Plan: continue inpatient treatments   Consultants:  GI  surgery  Antimicrobials:  Metronidazole 10/30  Vancomycin oral/rectal 10/30    Subjective: Feeling a little better today.  No vomiting.  No abdominal pain.  Objective: Vitals:   07/12/18 1412 07/12/18 2146 07/13/18 0506 07/13/18 0810    BP: 121/69 (!) 154/87 (!) 148/93   Pulse: 62 63 65   Resp: 18 16 16    Temp: 97.8 F (36.6 C) 97.7 F (36.5 C) 97.6 F (36.4 C)   TempSrc: Oral Oral Oral   SpO2: 100% 100% 99% 99%  Weight:      Height:        Intake/Output Summary (Last 24 hours) at 07/13/2018 1746 Last data filed at 07/13/2018 1300 Gross per 24 hour  Intake 1592.45 ml  Output 1675 ml  Net -82.55 ml   Filed Weights   07/04/18 0726 07/09/18 1355 07/11/18 0500  Weight: 49.9 kg 57.2 kg 50.3 kg   REVIEW OF SYSTEMS  As per history otherwise all reviewed and reported negative  Exam:  General exam: Alert, awake, oriented x 3 Respiratory system: Clear to auscultation. Respiratory effort normal. Cardiovascular system:RRR. No murmurs, rubs, gallops. Gastrointestinal system: Abdomen is nondistended, soft and nontender. No organomegaly or masses felt. Normal bowel sounds heard. Central nervous system: Alert and oriented. No focal neurological deficits. Extremities: Trace edema bilaterally Skin: No rashes, lesions or ulcers Psychiatry: Judgement and insight appear normal. Mood & affect appropriate.      Data Reviewed: Basic Metabolic Panel: Recent Labs  Lab 07/09/18 0501 07/10/18 0619 07/11/18 0430 07/12/18 0442 07/13/18 0448  NA 142 141 139 136 137  K 3.0* 3.4* 2.9* 3.9 3.9  CL 122* 118* 114* 113* 115*  CO2 15* 18* 18* 20* 16*  GLUCOSE 107* 108* 86 90 96  BUN 42* 32* 26* 24* 23  CREATININE 2.02* 1.85* 1.57* 1.52* 1.29*  CALCIUM 7.2* 7.7* 7.4* 7.5* 7.6*  MG 2.2  1.7 1.4* 2.2 1.9  PHOS 1.5* 1.8* 2.3* 2.5 3.5   Liver Function Tests: Recent Labs  Lab 07/09/18 0501 07/10/18 0619 07/11/18 0430 07/12/18 0442 07/13/18 0448  ALBUMIN 1.8* 2.1* 1.9* 2.0* 1.9*   No results for input(s): LIPASE, AMYLASE in the last 168 hours. No results for input(s): AMMONIA in the last 168 hours. CBC: Recent Labs  Lab 07/08/18 0447 07/09/18 0501 07/11/18 0430 07/12/18 0442 07/13/18 0658  WBC 5.0 6.1 7.0 6.4 6.1   NEUTROABS  --   --  5.5 5.0 4.9  HGB 10.0* 10.1* 9.9* 9.8* 9.3*  HCT 33.3* 34.2* 32.7* 32.4* 31.9*  MCV 100.6* 101.5* 99.1 99.7 102.2*  PLT 244 220 178 159 148*   Cardiac Enzymes: No results for input(s): CKTOTAL, CKMB, CKMBINDEX, TROPONINI in the last 168 hours. CBG (last 3)  No results for input(s): GLUCAP in the last 72 hours. Recent Results (from the past 240 hour(s))  MRSA PCR Screening     Status: None   Collection Time: 07/04/18  5:25 PM  Result Value Ref Range Status   MRSA by PCR NEGATIVE NEGATIVE Final    Comment:        The GeneXpert MRSA Assay (FDA approved for NASAL specimens only), is one component of a comprehensive MRSA colonization surveillance program. It is not intended to diagnose MRSA infection nor to guide or monitor treatment for MRSA infections. Performed at [facility], [address], [place], [place] [address]     Studies: No results found.  Scheduled Meds: . diclofenac sodium  4 g Topical QID  . famotidine  20 mg Oral QAC breakfast  . feeding supplement (ENSURE ENLIVE)  237 mL Oral TID BM  . feeding supplement (PRO-STAT SUGAR FREE 64)  30 mL Oral BID  . FLUoxetine  40 mg Oral Daily  . gabapentin  400 mg Oral BID  . heparin injection (subcutaneous)  5,000 Units Subcutaneous Q8H  . lidocaine  1 patch Transdermal Daily  . metoprolol tartrate  25 mg Oral BID  . mirtazapine  15 mg Oral QHS  . mometasone-formoterol  2 puff Inhalation BID  . predniSONE  5 mg Oral Q breakfast  . sodium bicarbonate  650 mg Oral TID  . sodium chloride flush  3 mL Intravenous Q12H  . vancomycin  500 mg Oral Q6H  . vancomycin (VANCOCIN) rectal ENEMA  500 mg Rectal Q6H   Continuous Infusions: . sodium chloride 10 mL/hr at 07/13/18 0508  . metronidazole 500 mg (07/13/18 0950)    Principal Problem:   Megacolon due to infectious colitis Active Problems:   Essential hypertension   COPD (chronic obstructive pulmonary disease) (HCC)   Lumbar compression fracture  (HCC)   AKI (acute kidney injury) (HCC)   Hypokalemia   Pseudomembranous colitis   Pressure injury of skin   Protein-calorie malnutrition, severe   Paralytic ileus of large intestine ([place])  Time spent: 30 mins  Kathie Dike, MD [facility] Pager [phone]  If 7PM-7AM, please contact night-coverage [email or web address] Password [email or web address] 07/13/2018, 5:46 PM    LOS: 9 days

## 2018-07-13 NOTE — Progress Notes (Signed)
PT refused bath at 2045. PT states tomorrow morning will be better. Foley care provided. Bedpads changed. Sacral foam placed

## 2018-07-13 NOTE — Progress Notes (Addendum)
Subjective: Interval History: Patient offers no complaints.  She did not have any nausea or vomiting.  She seems to be tolerating liquid diet.  Objective: Vital signs in last 24 hours: Temp:  [97.6 F (36.4 C)-97.8 F (36.6 C)] 97.6 F (36.4 C) (11/05 0506) Pulse Rate:  [62-65] 65 (11/05 0506) Resp:  [16-18] 16 (11/05 0506) BP: (121-154)/(69-93) 148/93 (11/05 0506) SpO2:  [98 %-100 %] 99 % (11/05 0810) Weight change:   Intake/Output from previous day: 11/04 0701 - 11/05 0700 In: 1580.3 [P.O.:480; I.V.:600.3; IV Piggyback:300] Out: 1975 [Urine:1100; Stool:575] Intake/Output this shift: No intake/output data recorded.  Patient is alert and in no apparent distress. Chest is clear to auscultation Heart exam revealed regular rate and rhythm Abdomen: Distended, tender and positive bowel sound Extremities she has no edema  Lab Results: Recent Labs    07/12/18 0442 07/13/18 0658  WBC 6.4 6.1  HGB 9.8* 9.3*  HCT 32.4* 31.9*  PLT 159 148*   BMET:  Recent Labs    07/12/18 0442 07/13/18 0448  NA 136 137  K 3.9 3.9  CL 113* 115*  CO2 20* 16*  GLUCOSE 90 96  BUN 24* 23  CREATININE 1.52* 1.29*  CALCIUM 7.5* 7.6*   No results for input([name]): PTH in the last 72 hours. Iron Studies: No results for input([name]): IRON, TIBC, TRANSFERRIN, FERRITIN in the last 72 hours.  Studies/Results: No results found.  I have reviewed the patient'[name] current medications.  Assessment/Plan: 1] acute kidney injury superimposed on chronic.  Her renal function continue to improve.  Her creatinine is 1.29 today. 2] anemia: Her hemoglobin is declining. 3] ileus: Patient presently is asymptomatic.  She did not have any nausea or vomiting.  No abdominal pain.  abdominal distention has improved. 4] bone and mineral disorder: Her calcium and phosphorus is within target goal. 5] fluid management: Denies any difficulty breathing.  Patient remains nonoliguric.  She has 1600 cc of urine output. 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  Patient is off potassium supplement.  Potassium has remained normal. Plan: 1] we will continue his present management 2] we will check a renal panel in the morning.   LOS: 9 days   Jordan [name] 07/13/2018,8:14 AM

## 2018-07-13 NOTE — Progress Notes (Signed)
CC'D TO PCP °

## 2018-07-13 NOTE — Care Management Note (Signed)
Case Management Note  Patient Details  Name: Jordan [name] MRN: [id number] Date of Birth: [birth year]  If discussed at [place] Length of Stay Meetings, dates discussed:  07/13/18  Additional Comments:  Sherald Barge, RN 07/13/2018, 1:57 PM

## 2018-07-14 LAB — RENAL FUNCTION PANEL
Albumin: 2.1 g/dL — ABNORMAL LOW (ref 3.5–5.0)
Albumin: 2.1 g/dL — ABNORMAL LOW (ref 3.5–5.0)
Anion gap: 4 — ABNORMAL LOW (ref 5–15)
Anion gap: 5 (ref 5–15)
BUN: 20 mg/dL (ref 8–23)
BUN: 21 mg/dL (ref 8–23)
CO2: 20 mmol/L — ABNORMAL LOW (ref 22–32)
CO2: 20 mmol/L — ABNORMAL LOW (ref 22–32)
Calcium: 7.9 mg/dL — ABNORMAL LOW (ref 8.9–10.3)
Calcium: 7.9 mg/dL — ABNORMAL LOW (ref 8.9–10.3)
Chloride: 116 mmol/L — ABNORMAL HIGH (ref 98–111)
Chloride: 117 mmol/L — ABNORMAL HIGH (ref 98–111)
Creatinine, Ser: 1.25 mg/dL — ABNORMAL HIGH (ref 0.44–1.00)
Creatinine, Ser: 1.3 mg/dL — ABNORMAL HIGH (ref 0.44–1.00)
GFR calc Af Amer: 46 mL/min — ABNORMAL LOW (ref >60)
GFR calc Af Amer: 49 mL/min — ABNORMAL LOW (ref >60)
GFR calc non Af Amer: 40 mL/min — ABNORMAL LOW (ref >60)
GFR calc non Af Amer: 42 mL/min — ABNORMAL LOW (ref >60)
Glucose, Bld: 88 mg/dL (ref 70–99)
Glucose, Bld: 88 mg/dL (ref 70–99)
Phosphorus: 3.1 mg/dL (ref 2.5–4.6)
Phosphorus: 3.2 mg/dL (ref 2.5–4.6)
Potassium: 3.5 mmol/L (ref 3.5–5.1)
Potassium: 3.5 mmol/L (ref 3.5–5.1)
Sodium: 141 mmol/L (ref 135–145)
Sodium: 141 mmol/L (ref 135–145)

## 2018-07-14 LAB — CBC WITH DIFFERENTIAL/PLATELET
Abs Immature Granulocytes: 0.08 10*3/uL — ABNORMAL HIGH (ref 0.00–0.07)
Basophils Absolute: 0.1 10*3/uL (ref 0.0–0.1)
Basophils Relative: 1 %
Eosinophils Absolute: 0.2 10*3/uL (ref 0.0–0.5)
Eosinophils Relative: 2 %
HCT: 37.8 % (ref 36.0–46.0)
Hemoglobin: 10.9 g/dL — ABNORMAL LOW (ref 12.0–15.0)
Immature Granulocytes: 1 %
Lymphocytes Relative: 12 %
Lymphs Abs: 0.9 10*3/uL (ref 0.7–4.0)
MCH: 30.8 pg (ref 26.0–34.0)
MCHC: 28.8 g/dL — ABNORMAL LOW (ref 30.0–36.0)
MCV: 106.8 fL — ABNORMAL HIGH (ref 80.0–100.0)
Monocytes Absolute: 0.2 10*3/uL (ref 0.1–1.0)
Monocytes Relative: 3 %
Neutro Abs: 5.9 10*3/uL (ref 1.7–7.7)
Neutrophils Relative %: 81 %
Platelets: 180 10*3/uL (ref 150–400)
RBC: 3.54 MIL/uL — ABNORMAL LOW (ref 3.87–5.11)
RDW: 13.5 % (ref 11.5–15.5)
WBC: 7.4 10*3/uL (ref 4.0–10.5)
nRBC: 0 % (ref 0.0–0.2)

## 2018-07-14 LAB — MAGNESIUM: Magnesium: 1.6 mg/dL — ABNORMAL LOW (ref 1.7–2.4)

## 2018-07-14 MED ORDER — MAGNESIUM SULFATE 2 GM/50ML IV SOLN
2.0000 g | Freq: Once | INTRAVENOUS | Status: AC
  Administered 2018-07-14: 2 g via INTRAVENOUS
  Filled 2018-07-14: qty 50

## 2018-07-14 MED ORDER — METRONIDAZOLE 500 MG PO TABS
500.0000 mg | ORAL_TABLET | Freq: Three times a day (TID) | ORAL | Status: DC
  Administered 2018-07-14 – 2018-07-19 (×16): 500 mg via ORAL
  Filled 2018-07-14 (×17): qty 1

## 2018-07-14 NOTE — Progress Notes (Signed)
Pt refused heparin injection this am.

## 2018-07-14 NOTE — Progress Notes (Signed)
Jordan [name]  MRN: [id number]  DOB/AGE: 1945-12-17 72 y.o.  Primary Care Physician:Hawkins, Percell Miller, MD  Admit date: 07/04/2018  Chief Complaint:  Chief Complaint  Patient presents with  . Abdominal Pain    S-Pt presented on  07/04/2018 with  Chief Complaint  Patient presents with  . Abdominal Pain  .    Pt offers no new concerns.   Meds . diclofenac sodium  4 g Topical QID  . famotidine  20 mg Oral QAC breakfast  . feeding supplement (ENSURE ENLIVE)  237 mL Oral TID BM  . feeding supplement (PRO-STAT SUGAR FREE 64)  30 mL Oral BID  . FLUoxetine  40 mg Oral Daily  . gabapentin  400 mg Oral BID  . heparin injection (subcutaneous)  5,000 Units Subcutaneous Q8H  . lidocaine  1 patch Transdermal Daily  . metoprolol tartrate  25 mg Oral BID  . mirtazapine  15 mg Oral QHS  . mometasone-formoterol  2 puff Inhalation BID  . predniSONE  5 mg Oral Q breakfast  . sodium bicarbonate  650 mg Oral TID  . sodium chloride flush  3 mL Intravenous Q12H  . vancomycin  500 mg Oral Q6H  . vancomycin (VANCOCIN) rectal ENEMA  500 mg Rectal Q6H      Physical Exam: Vital signs in last 24 hours: Temp:  [97.6 F (36.4 C)-98.3 F (36.8 C)] 97.6 F (36.4 C) (11/06 0624) Pulse Rate:  [61-63] 63 (11/06 0624) Resp:  [16] 16 (11/06 0624) BP: (136-163)/(63-69) 163/69 (11/06 0624) SpO2:  [98 %-100 %] 100 % (11/06 0816) Weight change:  Last BM Date: 07/12/18  Intake/Output from previous day: 11/05 0701 - 11/06 0700 In: 1180 [P.O.:840; I.V.:40] Out: -  No intake/output data recorded.   Physical Exam: General- pt is awake,follows commands. Resp- No acute REsp distress,  NO Rhonchi CVS- S1S2 regular in rate and rhythm GIT- BS+,distended EXT- NO LE Edema, NO Cyanosis   Lab Results: CBC Recent Labs    07/13/18 0658 07/14/18 0506  WBC 6.1 7.4  HGB 9.3* 10.9*  HCT 31.9* 37.8  PLT 148* 180    BMET Recent Labs    07/13/18 0448 07/14/18 0506  NA 137 141  141  K 3.9 3.5   3.5  CL 115* 116*  117*  CO2 16* 20*  20*  GLUCOSE 96 88  88  BUN 23 21  20   CREATININE 1.29* 1.25*  1.30*  CALCIUM 7.6* 7.9*  7.9*   Creat trend 2019   4.0=>4.2=>3.9=>3.1=>1.5=>1.3 2018   1.4--1.6 2017   1.5--1.6 2016   1.2 2015   1.2   MICRO Recent Results (from the past 240 hour(s))  MRSA PCR Screening     Status: None   Collection Time: 07/04/18  5:25 PM  Result Value Ref Range Status   MRSA by PCR NEGATIVE NEGATIVE Final    Comment:        The GeneXpert MRSA Assay (FDA approved for NASAL specimens only), is one component of a comprehensive MRSA colonization surveillance program. It is not intended to diagnose MRSA infection nor to guide or monitor treatment for MRSA infections. Performed at [facility], [address] [place], [place] [address]       Lab Results  Component Value Date   CALCIUM 7.9 (L) 07/14/2018   CALCIUM 7.9 (L) 07/14/2018   CAION 1.17 05/05/2017   PHOS 3.1 07/14/2018   PHOS 3.2 07/14/2018  Impression: 1)Renal  AKI secondary to ATN                AKI sec to Hypovolemia                AKI on CKD               CKD stage 3.               CKD since 2015               CKD secondary to HTN                Progression of CKD marked with AKI                Proteinura Absent .                2)HTN  Medication- On Diuretics  3)Anemia HGb at goal (9--11)   4)CKD Mineral-Bone Disorder Phosphorus  at goal. Calcium when corrected for low albumin is at goal.  5)ID-admitted with Cdiff Primary MD following  6)Electrolytes Normokalemic now   Was low earlier  NOrmonatremic   7)Acid base Co2 near to goal     Plan:   Will continue current care   Elburn S 07/14/2018, 9:18 AM

## 2018-07-14 NOTE — Progress Notes (Signed)
Subjective:  Clinically feeling better. No N/V. Abdominal distention much improved. Wants to go back to SNF.   Objective: Vital signs in last 24 hours: Temp:  [97.6 F (36.4 C)-98.3 F (36.8 C)] 97.6 F (36.4 C) (11/06 0624) Pulse Rate:  [61-63] 63 (11/06 0624) Resp:  [16] 16 (11/06 0624) BP: (136-163)/(63-69) 163/69 (11/06 0624) SpO2:  [98 %-100 %] 100 % (11/06 0816) Last BM Date: 07/12/18 General:   Alert,  Well-developed, well-nourished, pleasant and cooperative in NAD Head:  Normocephalic and atraumatic. Eyes:  Sclera clear, no icterus.  Abdomen:  Soft, less distended. Normal bowel sounds. nontender.   Neurologic:  Alert and  oriented x4 Psych:  Alert and cooperative. Normal mood and affect.  Intake/Output from previous day: 11/05 0701 - 11/06 0700 In: 1180 [P.O.:840; I.V.:40] Out: -  Intake/Output this shift: No intake/output data recorded.  Lab Results: CBC Recent Labs    07/12/18 0442 07/13/18 0658 07/14/18 0506  WBC 6.4 6.1 7.4  HGB 9.8* 9.3* 10.9*  HCT 32.4* 31.9* 37.8  MCV 99.7 102.2* 106.8*  PLT 159 148* 180   BMET Recent Labs    07/12/18 0442 07/13/18 0448 07/14/18 0506  NA 136 137 141  141  K 3.9 3.9 3.5  3.5  CL 113* 115* 116*  117*  CO2 20* 16* 20*  20*  GLUCOSE 90 96 88  88  BUN 24* 23 21  20   CREATININE 1.52* 1.29* 1.25*  1.30*  CALCIUM 7.5* 7.6* 7.9*  7.9*   LFTs Recent Labs    07/12/18 0442 07/13/18 0448 07/14/18 0506  ALBUMIN 2.0* 1.9* 2.1*  2.1*   No results for input(s): LIPASE in the last 72 hours. PT/INR No results for input(s): LABPROT, INR in the last 72 hours.    Imaging Studies: Ct Abdomen Pelvis Wo Contrast  Result Date: 07/04/2018 CLINICAL DATA:  Abdominal distension, nausea, on Flagyl for C diff EXAM: CT ABDOMEN AND PELVIS WITHOUT CONTRAST TECHNIQUE: Multidetector CT imaging of the abdomen and pelvis was performed following the standard protocol without IV contrast. COMPARISON:  05/05/2017 FINDINGS: Lower  chest: Small bilateral pleural effusions. Hepatobiliary: Unenhanced liver is unremarkable. Status post cholecystectomy. No intrahepatic or extrahepatic ductal dilatation. Pancreas: Within normal limits. Spleen: Within normal limits. Adrenals/Urinary Tract: Adrenal glands within normal limits. Right renal cortical atrophy/scarring. 8 mm nonobstructing right lower pole renal calculus (series 2/image 29). No hydronephrosis. Bladder is mildly thick-walled although underdistended. Stomach/Bowel: Stomach is notable for a small hiatal hernia. No evidence of small bowel obstruction. Diffuse colonic dilatation, measuring up to 9.4 cm in the cecum, which is mobile and located in the left mid abdomen (series 2/image 44). However, there is no significant rectum may be mildly thick-walled (series 2/image 77), but this is likely exacerbated by underdistention. No pneumatosis or free air. Vascular/Lymphatic: No evidence of abdominal aortic aneurysm. Atherosclerotic calcifications of the abdominal aorta and branch vessels. No suspicious abdominopelvic lymphadenopathy. Reproductive: Uterus is unremarkable. No adnexal masses. Other: Small volume perihepatic ascites. Trace pelvic ascites. Musculoskeletal: Prior vertebral augmentation at L4. Moderate compression fracture deformity at T12. Mild compression fracture deformity at L1. These findings are unchanged. Mild degenerative changes of the lumbar spine with dextroscoliosis. IMPRESSION: Diffuse colonic dilatation, without convincing wall thickening or pericolonic inflammatory changes despite the known history of C difficile colitis. As such, the overall appearance favors adynamic/paralytic colonic ileus. No pneumatosis or free air.  No evidence of small bowel obstruction. Small volume abdominopelvic ascites. Small bilateral pleural effusions. Electronically Signed   By: Bertis Ruddy  [place] Pink M.D.   On: 07/04/2018 14:23   Dg Abd 1 View  Result Date: 07/06/2018 CLINICAL DATA:   72 year old female with ileus. Abdominal pain. History kidney stones. Subsequent encounter. EXAM: ABDOMEN - 1 VIEW COMPARISON:  07/05/2018 plain film examination.  07/04/2018 CT. FINDINGS: Marked gas distended small and large bowel with cecum displaced medially and superiorly. Degree of distention has progressed since prior plain film examination with cecum now spanning over 15 cm versus prior 14 cm. This may represent colonic ileus as previously noted. Underlying inflammatory process or obstructing lesion were felt to be less likely considerations on prior CT. These possibilities cannot be adequately assessed on plain film exam. The possibility of free intraperitoneal air cannot be assessed on a supine view. Prior cement augmentation L4. Scoliosis lumbar spine with superimposed degenerative changes. IMPRESSION: Progressive gas distended bowel. The medially and superiorly displaced distended cecum now spans over 15 cm versus prior 14 cm. Please see above. These results will be called to the ordering clinician or representative by the Radiologist Assistant, and communication documented in the PACS or zVision Dashboard. Electronically Signed   By: Genia Del M.D.   On: 07/06/2018 09:02   Dg Abd 2 Views  Result Date: 07/05/2018 CLINICAL DATA:  Paralytic ileus large intestine EXAM: ABDOMEN - 2 VIEW COMPARISON:  07/04/2018 FINDINGS: Progressive colonic dilatation which is now severe. Negative for free air. Scoliosis and vertebroplasty L4 IMPRESSION: Progression of colonic dilatation compatible with ileus. Electronically Signed   By: Franchot Gallo M.D.   On: 07/05/2018 18:46   Dg Abd Acute W/chest  Result Date: 07/04/2018 CLINICAL DATA:  Abdominal pain, distension EXAM: DG ABDOMEN ACUTE W/ 1V CHEST COMPARISON:  Chest radiograph dated 04/27/2017 FINDINGS: Lungs are clear.  No pleural effusion or pneumothorax. The heart is normal in size. Colonic distension, suggesting adynamic colonic ileus. No findings to  suggest small bowel obstruction. No evidence of free air under the diaphragm on the upright view. Mild degenerative changes of the lumbar spine with lumbar dextroscoliosis. Vertebral augmentation at L4. Cholecystectomy clips. IMPRESSION: No evidence of acute cardiopulmonary disease. Suspected adynamic colonic ileus. No findings to suggest small bowel obstruction or free air. Electronically Signed   By: Julian Hy M.D.   On: 07/04/2018 09:20  [2 weeks]   Assessment: 72 year old female admitted with megacolon in the setting of CDI, continues to clinically improve.  Abdominal distention much improved.  Flexi-Seal remains in place, per nursing staff clamping after vanc enemas administered to help with retention.  Vanco oral/per rectum started on October 29, Flagyl started on October 30.  Plans to complete 14-day course of vancomycin and minimal 10-day up to 14-day course of IV Flagyl per guidelines for fulminant C. difficile colitis.     Plan: 1. Complete antibiotic regimen as outlined.  2. If patient requires systemic antibiotic therapy within the next couple of months, consider use of secondary prophylaxis during concomitant antibiotic use as it may reduce the likelihood of CDI recurrence.  Would use standard dosing with oral vancomycin 125 mg 4 times daily if this is required.    Laureen Ochs. Bernarda Caffey [facility] [phone] 11/6/20198:52 AM     LOS: 10 days

## 2018-07-14 NOTE — Progress Notes (Signed)
PROGRESS NOTE    Jordan [name]  SWN:[id number]  DOB: 01-15-1946  DOA: 07/04/2018 PCP: Sinda Du, MD   Brief Admission Hx: 72 year old female with neuromuscular dysfunction of bladder and colon, chronic kidney disease stage III, hypertension, dyslipidemia, IBS, COPD, lumbar compression fracture with recent history of? C. Difficile colitis started on Flagyl 3-4 days prior to admission presented to the ED with worsening abdominal distention with no bowel movement or urination for 2 days.  MDM/Assessment & Plan:   1. SVT- resolved now. Appears to be stable on metoprolol 25 mg twice daily. Follow HR.   2. Megacolon due to infectious colitis - continue IV flagyl, oral and rectal vancomycin.  Appreciate GI and surgery assistance.  Plan to dc rectal tube soon as stools are slowing down considerably.  Stools are still very loose and liquid.  Exact stool output has not been recorded.  Will need to follow stool output and once it is in reasonable amount, Flexi-Seal can be discontinued.  She is tolerating small amounts of solid food which should hopefully help her diarrhea. 3. Hypokalemia /  Hypomagnesemia -improved with IV replacement.   4. Prolonged QTc - Discontinued prozac/remerol.  Replacing lytes.  Follow.  5. AKI on CKD stage 3 - appreciate nephrology assistance.  Creatinine improving.  IV fluids discontinued 6. Essential hypertension - lisinopril on hold, started low dose metoprolol as above.  Blood pressure stable.  Follow.  7. Severe protein calorie malnutrition - dietitian consulted.  8. COPD - stable.  Resume home treatments.  9. Metabolic acidosis -suspect related to diarrhea and GI losses.  Improved with oral bicarbonate replacement.    DVT prophylaxis: subcut heparin Code Status: Full  Family Communication: patient  Disposition Plan: continue inpatient treatments   Consultants:  GI  surgery  Antimicrobials:  Metronidazole 10/30  Vancomycin oral/rectal 10/30     Subjective: No abdominal pain.  No vomiting.  Tolerating small amounts of solid foods.  Flexi-Seal remains in place with liquid brown-colored stool  Objective: Vitals:   07/13/18 2145 07/13/18 2152 07/14/18 0624 07/14/18 0816  BP: 136/63 136/63 (!) 163/69   Pulse:  61 63   Resp:  16 16   Temp:  98.3 F (36.8 C) 97.6 F (36.4 C)   TempSrc:  Oral Oral   SpO2:  100% 100% 100%  Weight:      Height:        Intake/Output Summary (Last 24 hours) at 07/14/2018 1241 Last data filed at 07/14/2018 0900 Gross per 24 hour  Intake 1180 ml  Output -  Net 1180 ml   Filed Weights   07/04/18 0726 07/09/18 1355 07/11/18 0500  Weight: 49.9 kg 57.2 kg 50.3 kg   REVIEW OF SYSTEMS  As per history otherwise all reviewed and reported negative  Exam:  General exam: Alert, awake, oriented x 3 Respiratory system: Clear to auscultation. Respiratory effort normal. Cardiovascular system:RRR. No murmurs, rubs, gallops. Gastrointestinal system: Abdomen is nondistended, soft and nontender. No organomegaly or masses felt. Normal bowel sounds heard. flexiseal in place Central nervous system: Alert and oriented. No focal neurological deficits. Extremities: No C/C/E, +pedal pulses Skin: No rashes, lesions or ulcers Psychiatry: Judgement and insight appear normal. Mood & affect appropriate.       Data Reviewed: Basic Metabolic Panel: Recent Labs  Lab 07/10/18 0619 07/11/18 0430 07/12/18 0442 07/13/18 0448 07/14/18 0506  NA 141 139 136 137 141  141  K 3.4* 2.9* 3.9 3.9 3.5  3.5  CL 118* 114* 113* 115*  116*  117*  CO2 18* 18* 20* 16* 20*  20*  GLUCOSE 108* 86 90 96 88  88  BUN 32* 26* 24* 23 21  20   CREATININE 1.85* 1.57* 1.52* 1.29* 1.25*  1.30*  CALCIUM 7.7* 7.4* 7.5* 7.6* 7.9*  7.9*  MG 1.7 1.4* 2.2 1.9 1.6*  PHOS 1.8* 2.3* 2.5 3.5 3.2  3.1   Liver Function Tests: Recent Labs  Lab 07/10/18 0619 07/11/18 0430 07/12/18 0442 07/13/18 0448 07/14/18 0506  ALBUMIN 2.1* 1.9*  2.0* 1.9* 2.1*  2.1*   No results for input(s): LIPASE, AMYLASE in the last 168 hours. No results for input(s): AMMONIA in the last 168 hours. CBC: Recent Labs  Lab 07/09/18 0501 07/11/18 0430 07/12/18 0442 07/13/18 0658 07/14/18 0506  WBC 6.1 7.0 6.4 6.1 7.4  NEUTROABS  --  5.5 5.0 4.9 5.9  HGB 10.1* 9.9* 9.8* 9.3* 10.9*  HCT 34.2* 32.7* 32.4* 31.9* 37.8  MCV 101.5* 99.1 99.7 102.2* 106.8*  PLT 220 178 159 148* 180   Cardiac Enzymes: No results for input(s): CKTOTAL, CKMB, CKMBINDEX, TROPONINI in the last 168 hours. CBG (last 3)  No results for input(s): GLUCAP in the last 72 hours. Recent Results (from the past 240 hour(s))  MRSA PCR Screening     Status: None   Collection Time: 07/04/18  5:25 PM  Result Value Ref Range Status   MRSA by PCR NEGATIVE NEGATIVE Final    Comment:        The GeneXpert MRSA Assay (FDA approved for NASAL specimens only), is one component of a comprehensive MRSA colonization surveillance program. It is not intended to diagnose MRSA infection nor to guide or monitor treatment for MRSA infections. Performed at [facility], [address], [place], [place] [address]     Studies: No results found.  Scheduled Meds: . diclofenac sodium  4 g Topical QID  . famotidine  20 mg Oral QAC breakfast  . feeding supplement (ENSURE ENLIVE)  237 mL Oral TID BM  . feeding supplement (PRO-STAT SUGAR FREE 64)  30 mL Oral BID  . FLUoxetine  40 mg Oral Daily  . gabapentin  400 mg Oral BID  . heparin injection (subcutaneous)  5,000 Units Subcutaneous Q8H  . lidocaine  1 patch Transdermal Daily  . metoprolol tartrate  25 mg Oral BID  . mirtazapine  15 mg Oral QHS  . mometasone-formoterol  2 puff Inhalation BID  . predniSONE  5 mg Oral Q breakfast  . sodium bicarbonate  650 mg Oral TID  . sodium chloride flush  3 mL Intravenous Q12H  . vancomycin  500 mg Oral Q6H  . vancomycin (VANCOCIN) rectal ENEMA  500 mg Rectal Q6H   Continuous Infusions: . sodium  chloride 10 mL/hr at 07/13/18 0508  . magnesium sulfate 1 - 4 g bolus IVPB 2 g (07/14/18 1158)  . metronidazole 500 mg (07/14/18 3762)    Principal Problem:   Megacolon due to infectious colitis Active Problems:   Essential hypertension   COPD (chronic obstructive pulmonary disease) (HCC)   Lumbar compression fracture (HCC)   AKI (acute kidney injury) (HCC)   Hypokalemia   Pseudomembranous colitis   Pressure injury of skin   Protein-calorie malnutrition, severe   Paralytic ileus of large intestine ([place])  Time spent: 30 mins  Kathie Dike, MD [facility] Pager [phone]  If 7PM-7AM, please contact night-coverage [email or web address] Password [email or web address] 07/14/2018, 12:41 PM    LOS: 10 days

## 2018-07-14 NOTE — Care Management Important Message (Signed)
Important Message  Patient Details  Name: Jordan [name] MRN: [id number] Date of Birth: May 24, 1946   Medicare Important Message Given:  Yes    Sherald Barge, RN 07/14/2018, 1:42 PM

## 2018-07-14 NOTE — Progress Notes (Signed)
Patient has orders to pull rectal tube, left rectal tube in due to order for vancomycin enema. Will have morning nurse clarify with Dr. Oneida Alar. Patient will not be able to retain antibiotic via enema without rectal tube.

## 2018-07-15 DIAGNOSIS — J449 Chronic obstructive pulmonary disease, unspecified: Secondary | ICD-10-CM

## 2018-07-15 LAB — CBC WITH DIFFERENTIAL/PLATELET
Abs Immature Granulocytes: 0.1 10*3/uL — ABNORMAL HIGH (ref 0.00–0.07)
Basophils Absolute: 0.1 10*3/uL (ref 0.0–0.1)
Basophils Relative: 1 %
Eosinophils Absolute: 0.1 10*3/uL (ref 0.0–0.5)
Eosinophils Relative: 1 %
HCT: 33.5 % — ABNORMAL LOW (ref 36.0–46.0)
Hemoglobin: 9.9 g/dL — ABNORMAL LOW (ref 12.0–15.0)
Immature Granulocytes: 1 %
Lymphocytes Relative: 12 %
Lymphs Abs: 1 10*3/uL (ref 0.7–4.0)
MCH: 30.9 pg (ref 26.0–34.0)
MCHC: 29.6 g/dL — ABNORMAL LOW (ref 30.0–36.0)
MCV: 104.7 fL — ABNORMAL HIGH (ref 80.0–100.0)
Monocytes Absolute: 0.3 10*3/uL (ref 0.1–1.0)
Monocytes Relative: 3 %
Neutro Abs: 6.7 10*3/uL (ref 1.7–7.7)
Neutrophils Relative %: 82 %
Platelets: 181 10*3/uL (ref 150–400)
RBC: 3.2 MIL/uL — ABNORMAL LOW (ref 3.87–5.11)
RDW: 13.6 % (ref 11.5–15.5)
WBC: 8.1 10*3/uL (ref 4.0–10.5)
nRBC: 0 % (ref 0.0–0.2)

## 2018-07-15 LAB — RENAL FUNCTION PANEL
Albumin: 2 g/dL — ABNORMAL LOW (ref 3.5–5.0)
Anion gap: 6 (ref 5–15)
BUN: 20 mg/dL (ref 8–23)
CO2: 17 mmol/L — ABNORMAL LOW (ref 22–32)
Calcium: 7.7 mg/dL — ABNORMAL LOW (ref 8.9–10.3)
Chloride: 119 mmol/L — ABNORMAL HIGH (ref 98–111)
Creatinine, Ser: 1.35 mg/dL — ABNORMAL HIGH (ref 0.44–1.00)
GFR calc Af Amer: 44 mL/min — ABNORMAL LOW (ref >60)
GFR calc non Af Amer: 38 mL/min — ABNORMAL LOW (ref >60)
Glucose, Bld: 98 mg/dL (ref 70–99)
Phosphorus: 2.4 mg/dL — ABNORMAL LOW (ref 2.5–4.6)
Potassium: 3.1 mmol/L — ABNORMAL LOW (ref 3.5–5.1)
Sodium: 142 mmol/L (ref 135–145)

## 2018-07-15 LAB — MAGNESIUM: Magnesium: 2 mg/dL (ref 1.7–2.4)

## 2018-07-15 MED ORDER — POTASSIUM CHLORIDE 10 MEQ/100ML IV SOLN
10.0000 meq | INTRAVENOUS | Status: AC
  Administered 2018-07-15 (×5): 10 meq via INTRAVENOUS
  Filled 2018-07-15 (×5): qty 100

## 2018-07-15 NOTE — Progress Notes (Signed)
Nutrition Follow-up  DOCUMENTATION CODES:   Severe malnutrition in context of acute illness/injury  INTERVENTION:   - Continue Prostat 30 ml BID, each supplement provides 100 kcals and 15 grams protein  - Magic Cup BID with lunch and dinner meals, each supplement provides 290 kcal and 9 grams of protein  - d/c Ensure Enlive due to pt preference  NUTRITION DIAGNOSIS:   Severe Malnutrition related to acute illness (C. diff w/ megacolon) as evidenced by moderate fat depletion, moderate muscle depletion, energy intake < or equal to 50% for > or equal to 5 days.  Progressing  GOAL:   Patient will meet greater than or equal to 90% of their needs  Progressing  MONITOR:   PO intake, Supplement acceptance, Diet advancement, Labs, Weight trends  REASON FOR ASSESSMENT:   Consult Assessment of nutrition requirement/status  ASSESSMENT:   72 year old female with PMH significant for neuromuscular dysfunction of colon/bladder, CKD stage 3, hypertension, hyperlipidemia, IBS, and COPD. Pt had been started on antibiotics for C. diff 3 days PTA. Presented to ED with abdominal distension and no BM/urination x 2 days. Worked up for AKI and megacolon r/t C. diff.  11/4 - diet advanced to full liquids 11/5 - diet advanced to soft  Spoke with pt at bedside. Noted 75% completed breakfast meal tray at bedside. Pt states that she is going slow with solid foods and not "gorging myself" just to be cautious. RD encouraged adequate PO intake with focus on high-calorie, high-protein foods to maintain weight and lean muscle mass.  Pt states that she is taking the Pro-stat but does not like the Ensure Enlive. Pt shares that she has tried all of the flavors but does not like any of them. RD to d/c order for Ensure Enlive and order Magic Cup with lunch and dinner meals. Pt is amenable to this as she has had Magic Cup in the past and prefers vanilla flavor.  Pt states that overall she feels much better. Pt  denies any nausea or vomiting and notes that her abdominal distention has decreased significantly.  Pt states that she is looking forward to discharge which she hopes will be tomorrow.  Noted plan to d/c Flexiseal today per GI. Plan is for pt to remain in the hospital to complete 14-day antibiotic regimen.  Most recent weight is 50.3 kg which was recorded on 07/11/18. This appears consistent with weights PTA ranging from 46.7-53.2 kg over the past 1 year.  Meal Completion: 65-100% since diet advanced to soft  Medications reviewed and include: Pepcid 20 mg daily, Ensure Enlive TID (pt refusing >50% of the time), Pro-stat 30 ml BID, Remeron 15 mg daily, sodium bicarb 650 mg TID, Vancocin enemas, IV KCl 10 mEq x 5 runs today  Labs reviewed: potassium 3.1 (L) - being repleted, CO2 17 (L), creatinine 1.35 (H), phosphorus 2.4 (L)  Diet Order:   Diet Order            DIET SOFT Room service appropriate? Yes; Fluid consistency: Thin  Diet effective now              EDUCATION NEEDS:   No education needs have been identified at this time  Skin: Skin Integrity Issues: Stage I: BLE heel Unstageable: R anterior ankle  Last BM:  11/6 (rectal tube)  Height:   Ht Readings from Last 1 Encounters:  07/04/18 5\' 2"  (1.575 m)    Weight:   Wt Readings from Last 1 Encounters:  07/11/18 50.3 kg  Ideal Body Weight:  50 kg  BMI:  Body mass index is 20.28 kg/m.  Estimated Nutritional Needs:   Kcal:  1700-1900 (30-33 kcal/kg)  Protein:  80-92 grams (1.4-1.6 g/kg)  Fluid:  >1.7 L fluid (30 ml/kg)    Gaynell Face, MS, RD, LDN Inpatient Clinical Dietitian Pager: [phone] Weekend/After Hours: [phone]

## 2018-07-15 NOTE — Progress Notes (Signed)
PROGRESS NOTE  Jordan [name]  NLZ:[id number]  DOB: 06-May-1946  DOA: 07/04/2018 PCP: Sinda Du, MD   Brief Admission Hx: 72 year old female with neuromuscular dysfunction of bladder and colon, chronic kidney disease stage III, hypertension, dyslipidemia, IBS, COPD, lumbar compression fracture with recent history of? C. Difficile colitis started on Flagyl 3-4 days prior to admission presented to the ED with worsening abdominal distention with no bowel movement or urination for 2 days.  MDM/Assessment & Plan:   1. SVT- resolved now. Appears to be stable on metoprolol 25 mg twice daily. Follow HR.   2. Megacolon due to infectious colitis - continue oral flagyl, oral and rectal vancomycin.  Planning to stay in hospital to complete full 14 day course of rectal vancomycin.  Appreciate GI and surgery assistance.  Rectal tube has been discontinued.  She is tolerating small amounts of solid food which should hopefully help her diarrhea. 3. Hypokalemia /  Hypomagnesemia - IV replacement ordered. Following.   4. Prolonged QTc - Discontinued prozac/remerol.  Replacing lytes.  Follow.  5. AKI on CKD stage 3 - appreciate nephrology assistance.  Creatinine improving.  IV fluids discontinued 6. Essential hypertension - lisinopril on hold, started low dose metoprolol as above.  Blood pressure stable.  Follow.  7. Severe protein calorie malnutrition - dietitian consulted.  8. COPD - stable.  Resume home treatments.  9. Metabolic acidosis -suspect related to diarrhea and GI losses.  Improved with oral bicarbonate replacement.    DVT prophylaxis: subcut heparin Code Status: Full  Family Communication: patient  Disposition Plan: continue inpatient treatments   Consultants:  GI  surgery  Antimicrobials:  Metronidazole 10/30  Vancomycin oral/rectal 10/30   Subjective: Pt says that she is really feeling better.   She would like to have breakfast.  No pain or discomfort, she feels her  diarrhea is slowing.   Objective: Vitals:   07/14/18 1912 07/14/18 2116 07/15/18 0549 07/15/18 0757  BP:  124/66 (!) 165/76   Pulse:  84 65   Resp:  16 15   Temp:  98.1 F (36.7 C) 99.1 F (37.3 C)   TempSrc:  Oral Oral   SpO2: 96% 98% 98% 96%  Weight:      Height:        Intake/Output Summary (Last 24 hours) at 07/15/2018 0907 Last data filed at 07/15/2018 0600 Gross per 24 hour  Intake 1150.06 ml  Output -  Net 1150.06 ml   Filed Weights   07/04/18 0726 07/09/18 1355 07/11/18 0500  Weight: 49.9 kg 57.2 kg 50.3 kg   REVIEW OF SYSTEMS  As per history otherwise all reviewed and reported negative  Exam:  General exam: Alert, awake, oriented x 3. Pt is cooperative.  Respiratory system: Clear to auscultation. Respiratory effort normal. Cardiovascular system: normal s1,s2 sounds. No murmurs, rubs, gallops. Gastrointestinal system: Abdomen is nondistended, soft and nontender. No organomegaly or masses felt. Normal bowel sounds heard.  Central nervous system: Alert and oriented. No focal neurological deficits. Extremities: No C/C/E, +pedal pulses Skin: No rashes, lesions or ulcers Psychiatry: Judgement and insight appear normal. Mood & affect appropriate.   Data Reviewed: Basic Metabolic Panel: Recent Labs  Lab 07/11/18 0430 07/12/18 0442 07/13/18 0448 07/14/18 0506 07/15/18 0518  NA 139 136 137 141  141 142  K 2.9* 3.9 3.9 3.5  3.5 3.1*  CL 114* 113* 115* 116*  117* 119*  CO2 18* 20* 16* 20*  20* 17*  GLUCOSE 86 90 96 88  88 98  BUN 26* 24* 23 21  20 20   CREATININE 1.57* 1.52* 1.29* 1.25*  1.30* 1.35*  CALCIUM 7.4* 7.5* 7.6* 7.9*  7.9* 7.7*  MG 1.4* 2.2 1.9 1.6* 2.0  PHOS 2.3* 2.5 3.5 3.2  3.1 2.4*   Liver Function Tests: Recent Labs  Lab 07/11/18 0430 07/12/18 0442 07/13/18 0448 07/14/18 0506 07/15/18 0518  ALBUMIN 1.9* 2.0* 1.9* 2.1*  2.1* 2.0*   No results for input(s): LIPASE, AMYLASE in the last 168 hours. No results for input(s):  AMMONIA in the last 168 hours. CBC: Recent Labs  Lab 07/11/18 0430 07/12/18 0442 07/13/18 0658 07/14/18 0506 07/15/18 0518  WBC 7.0 6.4 6.1 7.4 8.1  NEUTROABS 5.5 5.0 4.9 5.9 6.7  HGB 9.9* 9.8* 9.3* 10.9* 9.9*  HCT 32.7* 32.4* 31.9* 37.8 33.5*  MCV 99.1 99.7 102.2* 106.8* 104.7*  PLT 178 159 148* 180 181   Cardiac Enzymes: No results for input(s): CKTOTAL, CKMB, CKMBINDEX, TROPONINI in the last 168 hours. CBG (last 3)  No results for input(s): GLUCAP in the last 72 hours. No results found for this or any previous visit (from the past 240 hour(s)).  Studies: No results found.  Scheduled Meds: . diclofenac sodium  4 g Topical QID  . famotidine  20 mg Oral QAC breakfast  . feeding supplement (ENSURE ENLIVE)  237 mL Oral TID BM  . feeding supplement (PRO-STAT SUGAR FREE 64)  30 mL Oral BID  . FLUoxetine  40 mg Oral Daily  . gabapentin  400 mg Oral BID  . heparin injection (subcutaneous)  5,000 Units Subcutaneous Q8H  . lidocaine  1 patch Transdermal Daily  . metoprolol tartrate  25 mg Oral BID  . metroNIDAZOLE  500 mg Oral TID  . mirtazapine  15 mg Oral QHS  . mometasone-formoterol  2 puff Inhalation BID  . predniSONE  5 mg Oral Q breakfast  . sodium bicarbonate  650 mg Oral TID  . sodium chloride flush  3 mL Intravenous Q12H  . vancomycin  500 mg Oral Q6H  . vancomycin (VANCOCIN) rectal ENEMA  500 mg Rectal Q6H   Continuous Infusions: . sodium chloride 10 mL/hr at 07/15/18 0600  . potassium chloride      Principal Problem:   Megacolon due to infectious colitis Active Problems:   Essential hypertension   COPD (chronic obstructive pulmonary disease) (HCC)   Lumbar compression fracture (HCC)   AKI (acute kidney injury) ([place])   Hypokalemia   Pseudomembranous colitis   Pressure injury of skin   Protein-calorie malnutrition, severe   Paralytic ileus of large intestine ([place])  Time spent: 25 mins  Irwin Brakeman, MD [facility] Pager [phone]  If  7PM-7AM, please contact night-coverage [email or web address] Password [email or web address] 07/15/2018, 9:07 AM    LOS: 11 days

## 2018-07-15 NOTE — Care Management Note (Signed)
Case Management Note  Patient Details  Name: Jordan [name] MRN: [id number] Date of Birth: 1946-08-14  If discussed at [place] Length of Stay Meetings, dates discussed:  07/15/18  Additional Comments:  Sherald Barge, RN 07/15/2018, 3:52 PM

## 2018-07-15 NOTE — Progress Notes (Signed)
Subjective:  Didn't sleep well. No abd pain. Tolerating diet. Reminds me that her normal BMs are mushy and twice daily. Does not expect to have solid stool.   Objective: Vital signs in last 24 hours: Temp:  [98.1 F (36.7 C)-99.1 F (37.3 C)] 99.1 F (37.3 C) (11/07 0549) Pulse Rate:  [62-84] 65 (11/07 0549) Resp:  [15-18] 15 (11/07 0549) BP: (124-165)/(65-76) 165/76 (11/07 0549) SpO2:  [96 %-100 %] 98 % (11/07 0549) Last BM Date: 07/12/18 General:   Alert,  Well-developed, well-nourished, pleasant and cooperative in NAD Eyes:  Sclera clear, no icterus.  Abdomen:  Soft, nontender and mildly distended. Slightly tympanic bowel sounds but overall improved.   Extremities:  Without clubbing, deformity or edema. Neurologic:  Alert and  oriented x4;   Psych:  Alert and cooperative. Normal mood and affect.  Intake/Output from previous day: 11/06 0701 - 11/07 0700 In: 1390.1 [P.O.:720; I.V.:220.1; IV Piggyback:450] Out: -  Intake/Output this shift: No intake/output data recorded.  Lab Results: CBC Recent Labs    07/13/18 0658 07/14/18 0506 07/15/18 0518  WBC 6.1 7.4 8.1  HGB 9.3* 10.9* 9.9*  HCT 31.9* 37.8 33.5*  MCV 102.2* 106.8* 104.7*  PLT 148* 180 181   BMET Recent Labs    07/13/18 0448 07/14/18 0506 07/15/18 0518  NA 137 141  141 142  K 3.9 3.5  3.5 3.1*  CL 115* 116*  117* 119*  CO2 16* 20*  20* 17*  GLUCOSE 96 88  88 98  BUN 23 21  20 20   CREATININE 1.29* 1.25*  1.30* 1.35*  CALCIUM 7.6* 7.9*  7.9* 7.7*   LFTs Recent Labs    07/13/18 0448 07/14/18 0506 07/15/18 0518  ALBUMIN 1.9* 2.1*  2.1* 2.0*   No results for input(s): LIPASE in the last 72 hours. PT/INR No results for input(s): LABPROT, INR in the last 72 hours.    Imaging Studies: Ct Abdomen Pelvis Wo Contrast  Result Date: 07/04/2018 CLINICAL DATA:  Abdominal distension, nausea, on Flagyl for C diff EXAM: CT ABDOMEN AND PELVIS WITHOUT CONTRAST TECHNIQUE: Multidetector CT imaging of  the abdomen and pelvis was performed following the standard protocol without IV contrast. COMPARISON:  05/05/2017 FINDINGS: Lower chest: Small bilateral pleural effusions. Hepatobiliary: Unenhanced liver is unremarkable. Status post cholecystectomy. No intrahepatic or extrahepatic ductal dilatation. Pancreas: Within normal limits. Spleen: Within normal limits. Adrenals/Urinary Tract: Adrenal glands within normal limits. Right renal cortical atrophy/scarring. 8 mm nonobstructing right lower pole renal calculus (series 2/image 29). No hydronephrosis. Bladder is mildly thick-walled although underdistended. Stomach/Bowel: Stomach is notable for a small hiatal hernia. No evidence of small bowel obstruction. Diffuse colonic dilatation, measuring up to 9.4 cm in the cecum, which is mobile and located in the left mid abdomen (series 2/image 44). However, there is no significant rectum may be mildly thick-walled (series 2/image 77), but this is likely exacerbated by underdistention. No pneumatosis or free air. Vascular/Lymphatic: No evidence of abdominal aortic aneurysm. Atherosclerotic calcifications of the abdominal aorta and branch vessels. No suspicious abdominopelvic lymphadenopathy. Reproductive: Uterus is unremarkable. No adnexal masses. Other: Small volume perihepatic ascites. Trace pelvic ascites. Musculoskeletal: Prior vertebral augmentation at L4. Moderate compression fracture deformity at T12. Mild compression fracture deformity at L1. These findings are unchanged. Mild degenerative changes of the lumbar spine with dextroscoliosis. IMPRESSION: Diffuse colonic dilatation, without convincing wall thickening or pericolonic inflammatory changes despite the known history of C difficile colitis. As such, the overall appearance favors adynamic/paralytic colonic ileus. No pneumatosis or free  air.  No evidence of small bowel obstruction. Small volume abdominopelvic ascites. Small bilateral pleural effusions.  Electronically Signed   By: Julian Hy M.D.   On: 07/04/2018 14:23   Dg Abd 1 View  Result Date: 07/06/2018 CLINICAL DATA:  71 year old female with ileus. Abdominal pain. History kidney stones. Subsequent encounter. EXAM: ABDOMEN - 1 VIEW COMPARISON:  07/05/2018 plain film examination.  07/04/2018 CT. FINDINGS: Marked gas distended small and large bowel with cecum displaced medially and superiorly. Degree of distention has progressed since prior plain film examination with cecum now spanning over 15 cm versus prior 14 cm. This may represent colonic ileus as previously noted. Underlying inflammatory process or obstructing lesion were felt to be less likely considerations on prior CT. These possibilities cannot be adequately assessed on plain film exam. The possibility of free intraperitoneal air cannot be assessed on a supine view. Prior cement augmentation L4. Scoliosis lumbar spine with superimposed degenerative changes. IMPRESSION: Progressive gas distended bowel. The medially and superiorly displaced distended cecum now spans over 15 cm versus prior 14 cm. Please see above. These results will be called to the ordering clinician or representative by the Radiologist Assistant, and communication documented in the PACS or zVision Dashboard. Electronically Signed   By: Genia Del M.D.   On: 07/06/2018 09:02   Dg Abd 2 Views  Result Date: 07/05/2018 CLINICAL DATA:  Paralytic ileus large intestine EXAM: ABDOMEN - 2 VIEW COMPARISON:  07/04/2018 FINDINGS: Progressive colonic dilatation which is now severe. Negative for free air. Scoliosis and vertebroplasty L4 IMPRESSION: Progression of colonic dilatation compatible with ileus. Electronically Signed   By: Franchot Gallo M.D.   On: 07/05/2018 18:46   Dg Abd Acute W/chest  Result Date: 07/04/2018 CLINICAL DATA:  Abdominal pain, distension EXAM: DG ABDOMEN ACUTE W/ 1V CHEST COMPARISON:  Chest radiograph dated 04/27/2017 FINDINGS: Lungs are clear.  No  pleural effusion or pneumothorax. The heart is normal in size. Colonic distension, suggesting adynamic colonic ileus. No findings to suggest small bowel obstruction. No evidence of free air under the diaphragm on the upright view. Mild degenerative changes of the lumbar spine with lumbar dextroscoliosis. Vertebral augmentation at L4. Cholecystectomy clips. IMPRESSION: No evidence of acute cardiopulmonary disease. Suspected adynamic colonic ileus. No findings to suggest small bowel obstruction or free air. Electronically Signed   By: Julian Hy M.D.   On: 07/04/2018 09:20  [2 weeks]   Assessment: 72 year old female admitted with megacolon in the setting of CDI, continues to clinically improve.  Abdominal distention much better.  Vancomycin orally/per rectum started October 29.  Flagyl started October 30.  Plans to complete 14-day course of vancomycin and 10 to 14-day course of Flagyl per guidelines for fulminant CDI.  Plan: 1. DC Flexi-Seal, record all BMs. Discussed with nursing staff.  2. Complete antibiotic regimen as outlined. 3. Replete potassium/magnesium per attending.  4. If patient requires systemic antibiotic therapy within the next couple of months, consider use of secondary prophylaxis during concomitant antibiotic use as it may reduce the likelihood of CDI recurrence.  Would use standard dosing with oral vancomycin 125 mg 4 times daily if this is required.    Laureen Ochs. Bernarda Caffey [facility] [phone] 11/7/20197:59 AM     LOS: 11 days

## 2018-07-15 NOTE — Progress Notes (Signed)
Subjective: Interval History: Patient says that she is getting better.  Since he seemed to tolerate feeding.  She denies any nausea or vomiting.  Objective: Vital signs in last 24 hours: Temp:  [98.1 F (36.7 C)-99.1 F (37.3 C)] 99.1 F (37.3 C) (11/07 0549) Pulse Rate:  [62-84] 65 (11/07 0549) Resp:  [15-18] 15 (11/07 0549) BP: (124-165)/(65-76) 165/76 (11/07 0549) SpO2:  [96 %-100 %] 96 % (11/07 0757) Weight change:   Intake/Output from previous day: 11/06 0701 - 11/07 0700 In: 1390.1 [P.O.:720; I.V.:220.1; IV Piggyback:450] Out: -  Intake/Output this shift: Total I/O In: 240 [P.O.:240] Out: -   Patient is alert and in no apparent distress. Chest is clear to auscultation Heart exam revealed regular rate and rhythm Abdomen: Distention is improving.  Positive bowel sounds Extremities she has no edema  Lab Results: Recent Labs    07/14/18 0506 07/15/18 0518  WBC 7.4 8.1  HGB 10.9* 9.9*  HCT 37.8 33.5*  PLT 180 181   BMET:  Recent Labs    07/14/18 0506 07/15/18 0518  NA 141  141 142  K 3.5  3.5 3.1*  CL 116*  117* 119*  CO2 20*  20* 17*  GLUCOSE 88  88 98  BUN 21  20 20   CREATININE 1.25*  1.30* 1.35*  CALCIUM 7.9*  7.9* 7.7*   No results for input(s): PTH in the last 72 hours. Iron Studies: No results for input(s): IRON, TIBC, TRANSFERRIN, FERRITIN in the last 72 hours.  Studies/Results: No results found.  I have reviewed the patient's current medications.  Assessment/Plan: 1] acute kidney injury superimposed on chronic.  Renal function has remained stable and at this moment seems to be her baseline. 2] anemia: Her hemoglobin is 9.9 stable. 3] ileus: Patient presently is asymptomatic.  4] bone and mineral disorder: Her calcium and phosphorus is within target goal. 5] fluid management: Patient remains nonoliguric.  She has 1300 cc of urine output and denies any difficulty breathing. 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  Patient is not on potassium supplement. Plan: 1] we will start her on KCl 40 mEq p.o. once a day 2] we will check a renal panel in the morning. 3] potassium can be DC'd once her potassium returned to a normal level. 4] since her renal function has returned to her baseline we will sign off.  Thank you for letting us participate in her care.   LOS: 11 days   Astrid Vides S 07/15/2018,10:04 AM

## 2018-07-16 DIAGNOSIS — A0472 Enterocolitis due to Clostridium difficile, not specified as recurrent: Secondary | ICD-10-CM

## 2018-07-16 DIAGNOSIS — K567 Ileus, unspecified: Secondary | ICD-10-CM

## 2018-07-16 LAB — BASIC METABOLIC PANEL
Anion gap: 3 — ABNORMAL LOW (ref 5–15)
BUN: 22 mg/dL (ref 8–23)
CO2: 21 mmol/L — ABNORMAL LOW (ref 22–32)
Calcium: 7.7 mg/dL — ABNORMAL LOW (ref 8.9–10.3)
Chloride: 115 mmol/L — ABNORMAL HIGH (ref 98–111)
Creatinine, Ser: 1.18 mg/dL — ABNORMAL HIGH (ref 0.44–1.00)
GFR calc Af Amer: 52 mL/min — ABNORMAL LOW (ref >60)
GFR calc non Af Amer: 45 mL/min — ABNORMAL LOW (ref >60)
Glucose, Bld: 169 mg/dL — ABNORMAL HIGH (ref 70–99)
Potassium: 3.7 mmol/L (ref 3.5–5.1)
Sodium: 139 mmol/L (ref 135–145)

## 2018-07-16 NOTE — Progress Notes (Signed)
    Subjective: Feeling better this morning. Aware of the plan per hospitalist for IP admission until Vancomycin enemas completed. Denies abdominal pain, N/V. Tolerating diet well. No other GI complaints.  Objective: Vital signs in last 24 hours: Temp:  [98.2 F (36.8 C)-98.9 F (37.2 C)] 98.7 F (37.1 C) (11/08 0539) Pulse Rate:  [66-81] 66 (11/08 0539) Resp:  [16-20] 17 (11/08 0539) BP: (133-171)/(68-85) 171/85 (11/08 0539) SpO2:  [98 %-100 %] 100 % (11/08 0745) Last BM Date: 07/15/18 General:   Alert and oriented, pleasant Head:  Normocephalic and atraumatic. Eyes:  No icterus, sclera clear. Conjuctiva pink.  Heart:  S1, S2 present, no murmurs noted.  Lungs: Clear to auscultation bilaterally, without wheezing, rales, or rhonchi.  Abdomen:  Bowel sounds present, soft, non-tender, non-distended. No HSM or hernias noted. No rebound or guarding. No masses appreciated  Msk:  Symmetrical without gross deformities. Extremities:  Without clubbing or edema. Neurologic:  Alert and  oriented x4;  grossly normal neurologically. Psych:  Alert and cooperative. Normal mood and affect.  Intake/Output from previous day: 11/07 0701 - 11/08 0700 In: 1080.3 [P.O.:480; I.V.:113.1; IV Piggyback:487.3] Out: 25 [Stool:25] Intake/Output this shift: No intake/output data recorded.  Lab Results: Recent Labs    07/14/18 0506 07/15/18 0518  WBC 7.4 8.1  HGB 10.9* 9.9*  HCT 37.8 33.5*  PLT 180 181   BMET Recent Labs    07/14/18 0506 07/15/18 0518  NA 141  141 142  K 3.5  3.5 3.1*  CL 116*  117* 119*  CO2 20*  20* 17*  GLUCOSE 88  88 98  BUN 21  20 20   CREATININE 1.25*  1.30* 1.35*  CALCIUM 7.9*  7.9* 7.7*   LFT Recent Labs    07/14/18 0506 07/15/18 0518  ALBUMIN 2.1*  2.1* 2.0*   PT/INR No results for input(s): LABPROT, INR in the last 72 hours. Hepatitis Panel No results for input(s): HEPBSAG, HCVAB, HEPAIGM, HEPBIGM in the last 72 hours.   Studies/Results: No  results found.  Assessment: 72 year old female admitted with megacolon in the setting of CDiff infection, continues to clinically improve.  Abdominal distention much better. No abdominal pain, N/V. Stools soft and pasty consistent with her baseline, no watery diarrhea.  Vancomycin orally/per rectum started October 29.  Flagyl started October 30.  Plans to complete 14-day course of vancomycin (finish 07/19/18) and 10 to 14-day course of Flagyl per guidelines for fulminant CDiff. Plans per hospitalist to keep IP until Vanc enemas completed.  Continues to clinically improve today. No complaints.  Plan: 1. Continue Van enemas x 14 days an Flagyl 10-14 days 2. Monitor for recurrent abdominal distension, discomfort, diarrhea 3. Supportive measures 4. As previously recommended: Ifpatient requiressystemic antibiotic therapy within the next couple of months, consider use of secondary prophylaxisduring concomitant antibiotic use as it may reduce the likelihood of CDI recurrence. Would use standard dosing with oral vancomycin 125 mg 4 times daily if this is required.    Thank you for allowing [place] to participate in the care of Jordan Human, DNP, AGNP-C Adult & Gerontological Nurse Practitioner [facility]     LOS: 12 days    07/16/2018, 9:05 AM

## 2018-07-16 NOTE — Clinical Social Work Note (Signed)
CSW following. Per MD, pt will likely be stable for dc back to [place] on Monday next week. Will update Debbie at [place] and follow up on Monday.

## 2018-07-16 NOTE — Progress Notes (Signed)
PROGRESS NOTE  Jordan [name]  JOA:[id number]  DOB: 12/28/1945  DOA: 07/04/2018 PCP: Sinda Du, MD   Brief Admission Hx: 72 year old female with neuromuscular dysfunction of bladder and colon, chronic kidney disease stage III, hypertension, dyslipidemia, IBS, COPD, lumbar compression fracture with recent history of? C. Difficile colitis started on Flagyl 3-4 days prior to admission presented to the ED with worsening abdominal distention with no bowel movement or urination for 2 days.  MDM/Assessment & Plan:   1. SVT- resolved now. Appears to be stable on metoprolol 25 mg twice daily. Follow HR.   2. Megacolon due to infectious colitis - continue oral flagyl, oral and rectal vancomycin.  Planning to stay in hospital to complete full 14 day course of rectal vancomycin on 07/20/18.  Appreciate GI and surgery assistance.  Rectal tube has been discontinued.  She is tolerating small amounts of solid food which should hopefully help her diarrhea. 3. Hypokalemia /  Hypomagnesemia - IV replacement ordered. Following.   4. Prolonged QTc - Discontinued prozac/remerol.  Replacing lytes.  Follow.  5. AKI on CKD stage 3 - appreciate nephrology assistance.  Creatinine improving.  IV fluids discontinued 6. Essential hypertension - lisinopril on hold, started low dose metoprolol as above.  Blood pressure stable.  Follow.  7. Severe protein calorie malnutrition - dietitian consulted.  8. COPD - stable.  Resume home treatments.  9. Metabolic acidosis -suspect related to diarrhea and GI losses.  Improved with oral bicarbonate replacement.     DVT prophylaxis: subcut heparin Code Status: Full  Family Communication: patient  Disposition Plan: continue inpatient treatments   Consultants:  GI  surgery  Antimicrobials:  Metronidazole 10/30  Vancomycin oral/rectal 10/30   Subjective: Pt says that she is really feeling better.  Pt wondering when she can be discharged.   Objective: Vitals:   07/15/18 2016 07/15/18 2112 07/16/18 0539 07/16/18 0745  BP:  133/68 (!) 171/85   Pulse: 70 70 66   Resp: 18 20 17    Temp:  98.2 F (36.8 C) 98.7 F (37.1 C)   TempSrc:  Oral Oral   SpO2: 99% 99% 100% 100%  Weight:      Height:        Intake/Output Summary (Last 24 hours) at 07/16/2018 1240 Last data filed at 07/15/2018 2245 Gross per 24 hour  Intake 626.34 ml  Output -  Net 626.34 ml   Filed Weights   07/04/18 0726 07/09/18 1355 07/11/18 0500  Weight: 49.9 kg 57.2 kg 50.3 kg   REVIEW OF SYSTEMS  As per history otherwise all reviewed and reported negative  Exam:  General exam:  Alert, awake, oriented x 3. Pt is cooperative.  Respiratory system: Clear to auscultation.  Respiratory effort normal. Cardiovascular system: normal s1, s2 sounds.  No murmurs, rubs, gallops. Gastrointestinal system: Abdomen is nondistended, soft and nontender. No organomegaly or masses felt. Normal bowel sounds heard.  Central nervous system: Alert and oriented. No focal neurological deficits. Extremities: No C/C/E, +pedal pulses Skin: No rashes,  lesions or ulcers Psychiatry: Judgement  and insight appear normal. Mood & affect appropriate.   Data Reviewed: Basic Metabolic Panel: Recent Labs  Lab 07/11/18 0430 07/12/18 0442 07/13/18 0448 07/14/18 0506 07/15/18 0518  NA 139 136 137 141  141 142  K 2.9* 3.9 3.9 3.5  3.5 3.1*  CL 114* 113* 115* 116*  117* 119*  CO2 18* 20* 16* 20*  20* 17*  GLUCOSE 86 90 96 88  88 98  BUN 26*  24* 23 21  20 20   CREATININE 1.57* 1.52* 1.29* 1.25*  1.30* 1.35*  CALCIUM 7.4* 7.5* 7.6* 7.9*  7.9* 7.7*  MG 1.4* 2.2 1.9 1.6* 2.0  PHOS 2.3* 2.5 3.5 3.2  3.1 2.4*   Liver Function Tests: Recent Labs  Lab 07/11/18 0430 07/12/18 0442 07/13/18 0448 07/14/18 0506 07/15/18 0518  ALBUMIN 1.9* 2.0* 1.9* 2.1*  2.1* 2.0*   No results for input(s): LIPASE, AMYLASE in the last 168 hours. No results for input(s): AMMONIA in the last 168  hours. CBC: Recent Labs  Lab 07/11/18 0430 07/12/18 0442 07/13/18 0658 07/14/18 0506 07/15/18 0518  WBC 7.0 6.4 6.1 7.4 8.1  NEUTROABS 5.5 5.0 4.9 5.9 6.7  HGB 9.9* 9.8* 9.3* 10.9* 9.9*  HCT 32.7* 32.4* 31.9* 37.8 33.5*  MCV 99.1 99.7 102.2* 106.8* 104.7*  PLT 178 159 148* 180 181   Cardiac Enzymes: No results for input(s): CKTOTAL, CKMB, CKMBINDEX, TROPONINI in the last 168 hours. CBG (last 3)  No results for input(s): GLUCAP in the last 72 hours. No results found for this or any previous visit (from the past 240 hour(s)).  Studies: No results found.  Scheduled Meds: . diclofenac sodium  4 g Topical QID  . famotidine  20 mg Oral QAC breakfast  . feeding supplement (PRO-STAT SUGAR FREE 64)  30 mL Oral BID  . FLUoxetine  40 mg Oral Daily  . gabapentin  400 mg Oral BID  . heparin injection (subcutaneous)  5,000 Units Subcutaneous Q8H  . lidocaine  1 patch Transdermal Daily  . metoprolol tartrate  25 mg Oral BID  . metroNIDAZOLE  500 mg Oral TID  . mirtazapine  15 mg Oral QHS  . mometasone-formoterol  2 puff Inhalation BID  . predniSONE  5 mg Oral Q breakfast  . sodium bicarbonate  650 mg Oral TID  . sodium chloride flush  3 mL Intravenous Q12H  . vancomycin  500 mg Oral Q6H  . vancomycin (VANCOCIN) rectal ENEMA  500 mg Rectal Q6H   Continuous Infusions: . sodium chloride Stopped (07/15/18 1700)    Principal Problem:   Megacolon due to infectious colitis Active Problems:   Essential hypertension   COPD (chronic obstructive pulmonary disease) (HCC)   Lumbar compression fracture (HCC)   AKI (acute kidney injury) ([place])   Hypokalemia   Pseudomembranous colitis   Pressure injury of skin   Protein-calorie malnutrition, severe   Paralytic ileus of large intestine ([place])  Time spent: 25 mins  Irwin Brakeman, MD [facility] Pager [phone]  If 7PM-7AM, please contact night-coverage [email or web address] Password [email or web address] 07/16/2018, 12:40 PM    LOS: 12 days

## 2018-07-17 LAB — BASIC METABOLIC PANEL
Anion gap: 3 — ABNORMAL LOW (ref 5–15)
BUN: 24 mg/dL — ABNORMAL HIGH (ref 8–23)
CO2: 21 mmol/L — ABNORMAL LOW (ref 22–32)
Calcium: 7.9 mg/dL — ABNORMAL LOW (ref 8.9–10.3)
Chloride: 117 mmol/L — ABNORMAL HIGH (ref 98–111)
Creatinine, Ser: 1.24 mg/dL — ABNORMAL HIGH (ref 0.44–1.00)
GFR calc Af Amer: 49 mL/min — ABNORMAL LOW (ref >60)
GFR calc non Af Amer: 42 mL/min — ABNORMAL LOW (ref >60)
Glucose, Bld: 94 mg/dL (ref 70–99)
Potassium: 3.2 mmol/L — ABNORMAL LOW (ref 3.5–5.1)
Sodium: 141 mmol/L (ref 135–145)

## 2018-07-17 MED ORDER — AMLODIPINE BESYLATE 5 MG PO TABS
5.0000 mg | ORAL_TABLET | Freq: Every day | ORAL | Status: DC
  Administered 2018-07-17 – 2018-07-19 (×3): 5 mg via ORAL
  Filled 2018-07-17 (×3): qty 1

## 2018-07-17 MED ORDER — POTASSIUM CHLORIDE CRYS ER 20 MEQ PO TBCR
60.0000 meq | EXTENDED_RELEASE_TABLET | Freq: Once | ORAL | Status: AC
  Administered 2018-07-17: 60 meq via ORAL
  Filled 2018-07-17: qty 3

## 2018-07-17 NOTE — Progress Notes (Signed)
PROGRESS NOTE  Jordan [name]  [id number]:[id number]  DOB: Sep 03, 1946  DOA: 07/04/2018 PCP: Sinda Du, MD   Brief Admission Hx: 72 year old female with neuromuscular dysfunction of bladder and colon, chronic kidney disease stage III, hypertension, dyslipidemia, IBS, COPD, lumbar compression fracture with recent history of? C. Difficile colitis started on Flagyl 3-4 days prior to admission presented to the ED with worsening abdominal distention with no bowel movement or urination for 2 days.  MDM/Assessment & Plan:   1. SVT- resolved now.  Remains stable on metoprolol 25 mg twice daily.    2. Megacolon due to infectious colitis - continue oral flagyl, oral and rectal vancomycin.  Planning to stay in hospital to complete full 14 day course of rectal vancomycin on 07/20/18.  Appreciate GI and surgery assistance.  Rectal tube has been discontinued.  She is tolerating small amounts of solid food.  3. Hypokalemia /  Hypomagnesemia - IV and oral replacement ordered. Following.   4. Prolonged QTc - Discontinued prozac/remerol.  Replacing lytes.  Tele has been stable, will DC 11/9.   5. AKI on CKD stage 3 - appreciate nephrology assistance.  Creatinine improving.  IV fluids discontinued.  6. Essential hypertension - lisinopril discontinued, started low dose metoprolol as above.  Blood pressure suboptimally controlled, added amlodipine. 7. Severe protein calorie malnutrition - dietitian recommendations appreciated.  8. COPD - stable.  Resume home treatments.  9. Metabolic acidosis -suspect related to diarrhea and GI losses.  Improved with oral bicarbonate replacement.     DVT prophylaxis: subcut heparin Code Status: Full  Family Communication: patient  Disposition Plan: continue inpatient treatments, SNF has been arranged  Consultants:  GI  surgery  Antimicrobials:  Metronidazole 10/30  Vancomycin oral/rectal 10/30   Subjective: Pt without complaints.   Objective: Vitals:   07/16/18 1309 07/16/18 2025 07/16/18 2259 07/17/18 0504  BP: 131/68  (!) 154/75 (!) 149/76  Pulse: 74  67 67  Resp: 18  20 20   Temp: 98.5 F (36.9 C)  98.2 F (36.8 C) 97.6 F (36.4 C)  TempSrc: Oral  Oral Oral  SpO2: 99% 99% 99% 98%  Weight:      Height:        Intake/Output Summary (Last 24 hours) at 07/17/2018 0754 Last data filed at 07/16/2018 1712 Gross per 24 hour  Intake 720 ml  Output 700 ml  Net 20 ml   Filed Weights   07/04/18 0726 07/09/18 1355 07/11/18 0500  Weight: 49.9 kg 57.2 kg 50.3 kg   REVIEW OF SYSTEMS  As per history otherwise all reviewed and reported negative  Exam:  General exam:  Alert, awake, oriented x 3. Pt is cooperative.  Respiratory system: Clear to auscultation.  Respiratory effort normal. Cardiovascular system: normal s1, s2 sounds.  No murmurs, rubs, gallops. Gastrointestinal system: Abdomen is nondistended, soft and nontender. No organomegaly or masses felt. Normal bowel sounds heard.  Central nervous system: Alert and oriented. No focal neurological deficits. Extremities: No C/C/E, +pedal pulses Skin: No rashes,  lesions or ulcers Psychiatry: Judgement  and insight appear normal. Mood & affect appropriate.   Data Reviewed: Basic Metabolic Panel: Recent Labs  Lab 07/11/18 0430 07/12/18 0442 07/13/18 0448 07/14/18 0506 07/15/18 0518 07/16/18 1250 07/17/18 0637  NA 139 136 137 141  141 142 139 141  K 2.9* 3.9 3.9 3.5  3.5 3.1* 3.7 3.2*  CL 114* 113* 115* 116*  117* 119* 115* 117*  CO2 18* 20* 16* 20*  20* 17* 21* 21*  GLUCOSE  86 90 96 88  88 98 169* 94  BUN 26* 24* 23 21  20 20 22  24*  CREATININE 1.57* 1.52* 1.29* 1.25*  1.30* 1.35* 1.18* 1.24*  CALCIUM 7.4* 7.5* 7.6* 7.9*  7.9* 7.7* 7.7* 7.9*  MG 1.4* 2.2 1.9 1.6* 2.0  --   --   PHOS 2.3* 2.5 3.5 3.2  3.1 2.4*  --   --    Liver Function Tests: Recent Labs  Lab 07/11/18 0430 07/12/18 0442 07/13/18 0448 07/14/18 0506 07/15/18 0518  ALBUMIN 1.9* 2.0* 1.9* 2.1*   2.1* 2.0*   No results for input(s): LIPASE, AMYLASE in the last 168 hours. No results for input(s): AMMONIA in the last 168 hours. CBC: Recent Labs  Lab 07/11/18 0430 07/12/18 0442 07/13/18 0658 07/14/18 0506 07/15/18 0518  WBC 7.0 6.4 6.1 7.4 8.1  NEUTROABS 5.5 5.0 4.9 5.9 6.7  HGB 9.9* 9.8* 9.3* 10.9* 9.9*  HCT 32.7* 32.4* 31.9* 37.8 33.5*  MCV 99.1 99.7 102.2* 106.8* 104.7*  PLT 178 159 148* 180 181   Cardiac Enzymes: No results for input(s): CKTOTAL, CKMB, CKMBINDEX, TROPONINI in the last 168 hours. CBG (last 3)  No results for input(s): GLUCAP in the last 72 hours. No results found for this or any previous visit (from the past 240 hour(s)).  Studies: No results found.  Scheduled Meds: . diclofenac sodium  4 g Topical QID  . famotidine  20 mg Oral QAC breakfast  . feeding supplement (PRO-STAT SUGAR FREE 64)  30 mL Oral BID  . FLUoxetine  40 mg Oral Daily  . gabapentin  400 mg Oral BID  . heparin injection (subcutaneous)  5,000 Units Subcutaneous Q8H  . lidocaine  1 patch Transdermal Daily  . metoprolol tartrate  25 mg Oral BID  . metroNIDAZOLE  500 mg Oral TID  . mirtazapine  15 mg Oral QHS  . mometasone-formoterol  2 puff Inhalation BID  . potassium chloride  60 mEq Oral Once  . predniSONE  5 mg Oral Q breakfast  . sodium bicarbonate  650 mg Oral TID  . sodium chloride flush  3 mL Intravenous Q12H  . vancomycin  500 mg Oral Q6H  . vancomycin (VANCOCIN) rectal ENEMA  500 mg Rectal Q6H   Continuous Infusions: . sodium chloride Stopped (07/15/18 1700)    Principal Problem:   Megacolon due to infectious colitis Active Problems:   Essential hypertension   COPD (chronic obstructive pulmonary disease) (HCC)   Lumbar compression fracture (HCC)   AKI (acute kidney injury) ([place])   Hypokalemia   Pseudomembranous colitis   Pressure injury of skin   Protein-calorie malnutrition, severe   Paralytic ileus of large intestine ([place])   Ileus (HCC)   Clostridium  difficile colitis  Time spent: 23 mins  Irwin Brakeman, MD [facility] Pager [phone]  If 7PM-7AM, please contact night-coverage [email or web address] Password [email or web address] 07/17/2018, 7:54 AM    LOS: 13 days

## 2018-07-18 LAB — CBC WITH DIFFERENTIAL/PLATELET
Abs Immature Granulocytes: 0.05 10*3/uL (ref 0.00–0.07)
Basophils Absolute: 0.1 10*3/uL (ref 0.0–0.1)
Basophils Relative: 2 %
Eosinophils Absolute: 0.2 10*3/uL (ref 0.0–0.5)
Eosinophils Relative: 3 %
HCT: 30.5 % — ABNORMAL LOW (ref 36.0–46.0)
Hemoglobin: 9 g/dL — ABNORMAL LOW (ref 12.0–15.0)
Immature Granulocytes: 1 %
Lymphocytes Relative: 22 %
Lymphs Abs: 1.3 10*3/uL (ref 0.7–4.0)
MCH: 30.7 pg (ref 26.0–34.0)
MCHC: 29.5 g/dL — ABNORMAL LOW (ref 30.0–36.0)
MCV: 104.1 fL — ABNORMAL HIGH (ref 80.0–100.0)
Monocytes Absolute: 0.5 10*3/uL (ref 0.1–1.0)
Monocytes Relative: 8 %
Neutro Abs: 3.6 10*3/uL (ref 1.7–7.7)
Neutrophils Relative %: 64 %
Platelets: 184 10*3/uL (ref 150–400)
RBC: 2.93 MIL/uL — ABNORMAL LOW (ref 3.87–5.11)
RDW: 13.5 % (ref 11.5–15.5)
WBC: 5.7 10*3/uL (ref 4.0–10.5)
nRBC: 0 % (ref 0.0–0.2)

## 2018-07-18 LAB — MAGNESIUM: Magnesium: 1.6 mg/dL — ABNORMAL LOW (ref 1.7–2.4)

## 2018-07-18 LAB — BASIC METABOLIC PANEL
Anion gap: 4 — ABNORMAL LOW (ref 5–15)
BUN: 24 mg/dL — ABNORMAL HIGH (ref 8–23)
CO2: 22 mmol/L (ref 22–32)
Calcium: 7.8 mg/dL — ABNORMAL LOW (ref 8.9–10.3)
Chloride: 116 mmol/L — ABNORMAL HIGH (ref 98–111)
Creatinine, Ser: 1.17 mg/dL — ABNORMAL HIGH (ref 0.44–1.00)
GFR calc Af Amer: 53 mL/min — ABNORMAL LOW (ref >60)
GFR calc non Af Amer: 45 mL/min — ABNORMAL LOW (ref >60)
Glucose, Bld: 92 mg/dL (ref 70–99)
Potassium: 3.6 mmol/L (ref 3.5–5.1)
Sodium: 142 mmol/L (ref 135–145)

## 2018-07-18 NOTE — Progress Notes (Signed)
Patient refused 1600 dose of rectal vancomycin. Dr. Kyung Bacca made aware

## 2018-07-18 NOTE — Progress Notes (Signed)
PROGRESS NOTE  Jordan [name] GEX:[id number] DOB: 1946/04/07 DOA: 07/04/2018 PCP: Sinda Du, MD  Brief Narrative: 72 year old female with neuromuscular dysfunction of bladder: Chronic kidney disease stage III hypertension dyslipidemia IBS COPD lumbar compression fracture with recent history of questionable C. difficile colitis who was started on outpatient Flagyl but failed outpatient Flagyl was admitted with progressive worsening abdominal pain and distention with no bowel movements or urination for 2 days prior to admission.   Interval history/Subjective: Patient seen at bedside doing well she is currently receiving rectal vancomycin.  She is supposed to complete 14 days of rectal vancomycin.  She is also on Flagyl  P.o. patient was getting tired of the rectal antibiotics she had refused her dose this afternoon she was encouraged to complete it because this will be her last dose   Assessment/Plan  1.  Megacolon due to infectious colitis.  She will continue and complete her IV antibiotics 2.  Acute kidney injury on chronic kidney disease stage III nephrology following. 3.Essential hypertension.  Lisinopril was discontinued and she was started on low-dose metoprolol she will continue her amlodipine 4.  Severe protein calorie malnutrition dietitian has seen and made recommendations 5.  COPD stable continue home meds 6.  SVT which has resolved she will continue her metoprolol 25 mg twice a day  DVT prophylaxis: subcut heparin Code Status: Full  Family Communication: patient  Disposition Plan: continue inpatient treatments complete rectal vancomycin, SNF has been arranged   Antimicrobials:  Metronidazole 10/30  Vancomycin oral/rectal 10/30    Dr. Kyung Bacca [facility] Pager [phone]  07/18/2018, 8:21 PM  LOS: 14 days   Consultants:  General surgery  Gastroenterology  Procedures:  None   Objective: Vitals:  Vitals:   07/18/18 0844 07/18/18 1402  BP:  (!)  122/54  Pulse:  72  Resp:  18  Temp:  98 F (36.7 C)  SpO2: 98% 98%    Exam:  Constitutional:  . Appears calm and comfortable Eyes:  . pupils and irises appear normal . Normal lids and conjunctivae ENMT:  . grossly normal hearing  . Lips appear normal . external ears, nose appear normal . Oropharynx: mucosa, tongue,posterior pharynx appear normal Neck:  . neck appears normal, no masses, normal ROM, supple . no thyromegaly Respiratory:  . CTA bilaterally, no w/r/r.  . Respiratory effort normal. No retractions or accessory muscle use Cardiovascular:  . RRR, no m/r/g . No LE extremity edema   . Normal pedal pulses Abdomen:  . Abdomen appears normal; no tenderness or masses . No hernias . No HSM Musculoskeletal:  . Digits/nails BUE: no clubbing, cyanosis, petechiae, infection . exam of joints, bones, muscles of at least one of following: head/neck, RUE, LUE, RLE, LLE   o strength and tone normal, no atrophy, no abnormal movements o No tenderness, masses o Normal ROM, no contractures  . gait and station Skin:  . No rashes, lesions, ulcers . palpation of skin: no induration or nodules Neurologic:  . CN 2-12 intact . Sensation all 4 extremities intact Psychiatric:  . Mental status o Mood, affect appropriate o Orientation to person, place, time  . judgment and insight appear intact     I have personally reviewed the following:   Data: .  Results for Jordan, [name] (MRN [id number]) as of 07/18/2018 20:22  Ref. Range 07/18/2018 42:59  BASIC METABOLIC PANEL Unknown Rpt (A)  Sodium Latest Ref Range: 135 - 145 mmol/L 142  Potassium Latest Ref Range: 3.5 - 5.1 mmol/L 3.6  Chloride Latest Ref Range: 98 - 111 mmol/L 116 (H)  CO2 Latest Ref Range: 22 - 32 mmol/L 22  Glucose Latest Ref Range: 70 - 99 mg/dL 92  BUN Latest Ref Range: 8 - 23 mg/dL 24 (H)  Creatinine Latest Ref Range: 0.44 - 1.00 mg/dL 1.17 (H)  Calcium Latest Ref Range: 8.9 - 10.3 mg/dL 7.8 (L)  Anion  gap Latest Ref Range: 5 - 15  4 (L)  Magnesium Latest Ref Range: 1.7 - 2.4 mg/dL 1.6 (L)  GFR, Est Non African American Latest Ref Range: >60 mL/min 45 (L)  GFR, Est African American Latest Ref Range: >60 mL/min 53 (L)  WBC Latest Ref Range: 4.0 - 10.5 K/uL 5.7  RBC Latest Ref Range: 3.87 - 5.11 MIL/uL 2.93 (L)  Hemoglobin Latest Ref Range: 12.0 - 15.0 g/dL 9.0 (L)  HCT Latest Ref Range: 36.0 - 46.0 % 30.5 (L)  MCV Latest Ref Range: 80.0 - 100.0 fL 104.1 (H)  MCH Latest Ref Range: 26.0 - 34.0 pg 30.7  MCHC Latest Ref Range: 30.0 - 36.0 g/dL 29.5 (L)  RDW Latest Ref Range: 11.5 - 15.5 % 13.5  Platelets Latest Ref Range: 150 - 400 K/uL 184  nRBC Latest Ref Range: 0.0 - 0.2 % 0.0  Neutrophils Latest Units: % 64  Lymphocytes Latest Units: % 22  Monocytes Relative Latest Units: % 8  Eosinophil Latest Units: % 3  Basophil Latest Units: % 2  Immature Granulocytes Latest Units: % 1  NEUT# Latest Ref Range: 1.7 - 7.7 K/uL 3.6  Lymphocyte # Latest Ref Range: 0.7 - 4.0 K/uL 1.3  Monocyte # Latest Ref Range: 0.1 - 1.0 K/uL 0.5  Eosinophils Absolute Latest Ref Range: 0.0 - 0.5 K/uL 0.2  Basophils Absolute Latest Ref Range: 0.0 - 0.1 K/uL 0.1  Abs Immature Granulocytes Latest Ref Range: 0.00 - 0.07 K/uL 0.05  WBC Morphology Unknown MILD LEFT SHIFT (1-5% METAS, OCC MYELO, OCC BANDS)  Smudge Cells Unknown PRESENT   Scheduled Meds: . amLODipine  5 mg Oral Daily  . diclofenac sodium  4 g Topical QID  . famotidine  20 mg Oral QAC breakfast  . feeding supplement (PRO-STAT SUGAR FREE 64)  30 mL Oral BID  . FLUoxetine  40 mg Oral Daily  . gabapentin  400 mg Oral BID  . heparin injection (subcutaneous)  5,000 Units Subcutaneous Q8H  . lidocaine  1 patch Transdermal Daily  . metoprolol tartrate  25 mg Oral BID  . metroNIDAZOLE  500 mg Oral TID  . mirtazapine  15 mg Oral QHS  . mometasone-formoterol  2 puff Inhalation BID  . predniSONE  5 mg Oral Q breakfast  . sodium bicarbonate  650 mg Oral TID    . sodium chloride flush  3 mL Intravenous Q12H  . vancomycin  500 mg Oral Q6H  . vancomycin (VANCOCIN) rectal ENEMA  500 mg Rectal Q6H   Continuous Infusions: . sodium chloride Stopped (07/15/18 1700)    Principal Problem:   Megacolon due to infectious colitis Active Problems:   Essential hypertension   COPD (chronic obstructive pulmonary disease) (HCC)   Lumbar compression fracture (HCC)   AKI (acute kidney injury) ([place])   Hypokalemia   Pseudomembranous colitis   Pressure injury of skin   Protein-calorie malnutrition, severe   Paralytic ileus of large intestine (HCC)   Ileus (HCC)   Clostridium difficile colitis   LOS: 14 days

## 2018-07-19 ENCOUNTER — Telehealth: Payer: Self-pay | Admitting: Gastroenterology

## 2018-07-19 DIAGNOSIS — F329 Major depressive disorder, single episode, unspecified: Secondary | ICD-10-CM | POA: Diagnosis not present

## 2018-07-19 DIAGNOSIS — K117 Disturbances of salivary secretion: Secondary | ICD-10-CM | POA: Diagnosis not present

## 2018-07-19 DIAGNOSIS — T783XXA Angioneurotic edema, initial encounter: Secondary | ICD-10-CM | POA: Diagnosis not present

## 2018-07-19 DIAGNOSIS — N183 Chronic kidney disease, stage 3 (moderate): Secondary | ICD-10-CM | POA: Diagnosis not present

## 2018-07-19 DIAGNOSIS — K560 Paralytic ileus: Secondary | ICD-10-CM | POA: Diagnosis present

## 2018-07-19 DIAGNOSIS — F331 Major depressive disorder, recurrent, moderate: Secondary | ICD-10-CM | POA: Diagnosis not present

## 2018-07-19 DIAGNOSIS — R600 Localized edema: Secondary | ICD-10-CM | POA: Diagnosis not present

## 2018-07-19 DIAGNOSIS — E43 Unspecified severe protein-calorie malnutrition: Secondary | ICD-10-CM | POA: Diagnosis present

## 2018-07-19 DIAGNOSIS — I129 Hypertensive chronic kidney disease with stage 1 through stage 4 chronic kidney disease, or unspecified chronic kidney disease: Secondary | ICD-10-CM | POA: Diagnosis not present

## 2018-07-19 DIAGNOSIS — J189 Pneumonia, unspecified organism: Secondary | ICD-10-CM | POA: Diagnosis not present

## 2018-07-19 DIAGNOSIS — E039 Hypothyroidism, unspecified: Secondary | ICD-10-CM | POA: Diagnosis not present

## 2018-07-19 DIAGNOSIS — R109 Unspecified abdominal pain: Secondary | ICD-10-CM | POA: Diagnosis not present

## 2018-07-19 DIAGNOSIS — R296 Repeated falls: Secondary | ICD-10-CM | POA: Diagnosis present

## 2018-07-19 DIAGNOSIS — K148 Other diseases of tongue: Secondary | ICD-10-CM | POA: Diagnosis not present

## 2018-07-19 DIAGNOSIS — A0472 Enterocolitis due to Clostridium difficile, not specified as recurrent: Secondary | ICD-10-CM | POA: Diagnosis not present

## 2018-07-19 DIAGNOSIS — K592 Neurogenic bowel, not elsewhere classified: Secondary | ICD-10-CM | POA: Diagnosis present

## 2018-07-19 DIAGNOSIS — R1310 Dysphagia, unspecified: Secondary | ICD-10-CM | POA: Diagnosis not present

## 2018-07-19 DIAGNOSIS — D631 Anemia in chronic kidney disease: Secondary | ICD-10-CM | POA: Diagnosis not present

## 2018-07-19 DIAGNOSIS — R682 Dry mouth, unspecified: Secondary | ICD-10-CM | POA: Diagnosis present

## 2018-07-19 DIAGNOSIS — R197 Diarrhea, unspecified: Secondary | ICD-10-CM | POA: Diagnosis not present

## 2018-07-19 DIAGNOSIS — Z8673 Personal history of transient ischemic attack (TIA), and cerebral infarction without residual deficits: Secondary | ICD-10-CM | POA: Diagnosis not present

## 2018-07-19 DIAGNOSIS — J45909 Unspecified asthma, uncomplicated: Secondary | ICD-10-CM | POA: Diagnosis present

## 2018-07-19 DIAGNOSIS — S12000S Unspecified displaced fracture of first cervical vertebra, sequela: Secondary | ICD-10-CM | POA: Diagnosis not present

## 2018-07-19 DIAGNOSIS — A09 Infectious gastroenteritis and colitis, unspecified: Secondary | ICD-10-CM | POA: Diagnosis not present

## 2018-07-19 DIAGNOSIS — Z79899 Other long term (current) drug therapy: Secondary | ICD-10-CM | POA: Diagnosis not present

## 2018-07-19 DIAGNOSIS — J449 Chronic obstructive pulmonary disease, unspecified: Secondary | ICD-10-CM | POA: Diagnosis not present

## 2018-07-19 DIAGNOSIS — F339 Major depressive disorder, recurrent, unspecified: Secondary | ICD-10-CM | POA: Diagnosis present

## 2018-07-19 DIAGNOSIS — J441 Chronic obstructive pulmonary disease with (acute) exacerbation: Secondary | ICD-10-CM | POA: Diagnosis not present

## 2018-07-19 DIAGNOSIS — F4321 Adjustment disorder with depressed mood: Secondary | ICD-10-CM | POA: Diagnosis not present

## 2018-07-19 DIAGNOSIS — I10 Essential (primary) hypertension: Secondary | ICD-10-CM | POA: Diagnosis not present

## 2018-07-19 DIAGNOSIS — E876 Hypokalemia: Secondary | ICD-10-CM | POA: Diagnosis present

## 2018-07-19 DIAGNOSIS — R609 Edema, unspecified: Secondary | ICD-10-CM | POA: Diagnosis not present

## 2018-07-19 DIAGNOSIS — D649 Anemia, unspecified: Secondary | ICD-10-CM | POA: Diagnosis not present

## 2018-07-19 DIAGNOSIS — R279 Unspecified lack of coordination: Secondary | ICD-10-CM | POA: Diagnosis present

## 2018-07-19 DIAGNOSIS — Z743 Need for continuous supervision: Secondary | ICD-10-CM | POA: Diagnosis not present

## 2018-07-19 DIAGNOSIS — E785 Hyperlipidemia, unspecified: Secondary | ICD-10-CM | POA: Diagnosis not present

## 2018-07-19 DIAGNOSIS — N319 Neuromuscular dysfunction of bladder, unspecified: Secondary | ICD-10-CM | POA: Diagnosis present

## 2018-07-19 DIAGNOSIS — M1990 Unspecified osteoarthritis, unspecified site: Secondary | ICD-10-CM | POA: Diagnosis not present

## 2018-07-19 DIAGNOSIS — R7982 Elevated C-reactive protein (CRP): Secondary | ICD-10-CM | POA: Diagnosis not present

## 2018-07-19 DIAGNOSIS — R5381 Other malaise: Secondary | ICD-10-CM | POA: Diagnosis not present

## 2018-07-19 DIAGNOSIS — R52 Pain, unspecified: Secondary | ICD-10-CM | POA: Diagnosis not present

## 2018-07-19 DIAGNOSIS — N139 Obstructive and reflux uropathy, unspecified: Secondary | ICD-10-CM | POA: Diagnosis present

## 2018-07-19 DIAGNOSIS — F320 Major depressive disorder, single episode, mild: Secondary | ICD-10-CM | POA: Diagnosis not present

## 2018-07-19 DIAGNOSIS — N184 Chronic kidney disease, stage 4 (severe): Secondary | ICD-10-CM | POA: Diagnosis not present

## 2018-07-19 DIAGNOSIS — E559 Vitamin D deficiency, unspecified: Secondary | ICD-10-CM | POA: Diagnosis not present

## 2018-07-19 DIAGNOSIS — N189 Chronic kidney disease, unspecified: Secondary | ICD-10-CM | POA: Diagnosis present

## 2018-07-19 DIAGNOSIS — M6281 Muscle weakness (generalized): Secondary | ICD-10-CM | POA: Diagnosis present

## 2018-07-19 DIAGNOSIS — K5939 Other megacolon: Secondary | ICD-10-CM | POA: Diagnosis not present

## 2018-07-19 DIAGNOSIS — R269 Unspecified abnormalities of gait and mobility: Secondary | ICD-10-CM | POA: Diagnosis present

## 2018-07-19 DIAGNOSIS — R262 Difficulty in walking, not elsewhere classified: Secondary | ICD-10-CM | POA: Diagnosis present

## 2018-07-19 DIAGNOSIS — S32049S Unspecified fracture of fourth lumbar vertebra, sequela: Secondary | ICD-10-CM | POA: Diagnosis not present

## 2018-07-19 DIAGNOSIS — S14109S Unspecified injury at unspecified level of cervical spinal cord, sequela: Secondary | ICD-10-CM | POA: Diagnosis not present

## 2018-07-19 DIAGNOSIS — S12100S Unspecified displaced fracture of second cervical vertebra, sequela: Secondary | ICD-10-CM | POA: Diagnosis not present

## 2018-07-19 DIAGNOSIS — R2681 Unsteadiness on feet: Secondary | ICD-10-CM | POA: Diagnosis present

## 2018-07-19 DIAGNOSIS — G478 Other sleep disorders: Secondary | ICD-10-CM | POA: Diagnosis not present

## 2018-07-19 DIAGNOSIS — I252 Old myocardial infarction: Secondary | ICD-10-CM | POA: Diagnosis not present

## 2018-07-19 MED ORDER — SODIUM BICARBONATE 650 MG PO TABS: 650.0000 mg | ORAL_TABLET | Freq: Three times a day (TID) | ORAL | 0 refills | Status: DC

## 2018-07-19 MED ORDER — VANCOMYCIN 50 MG/ML ORAL SOLUTION: 500.0000 mg | Freq: Four times a day (QID) | ORAL | 0 refills | Status: AC

## 2018-07-19 MED ORDER — AMLODIPINE BESYLATE 5 MG PO TABS: 5.0000 mg | ORAL_TABLET | Freq: Every day | ORAL | 0 refills | Status: DC

## 2018-07-19 MED ORDER — METOPROLOL TARTRATE 25 MG PO TABS: 25.0000 mg | ORAL_TABLET | Freq: Two times a day (BID) | ORAL | 0 refills | Status: AC

## 2018-07-19 NOTE — Discharge Summary (Signed)
Discharge Summary  Jordan [name] ZOX:[id number] DOB: 1946/07/28  PCP: Sinda Du, MD  Admit date: 07/04/2018 Discharge date: 07/19/2018  Time spent: 39   Recommendations for Outpatient Follow-up:  1. Gastroenterology in 2 months   Discharge Diagnoses:  Active Hospital Problems   Diagnosis Date Noted  . 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 Ileus ([place])   . Clostridium difficile colitis   . Paralytic ileus of large intestine ([place])   . Pressure injury of skin 07/09/2018  . Protein-calorie malnutrition, severe 07/09/2018  . Pseudomembranous colitis   . AKI (acute kidney injury) ([place]) 07/04/2018  . Hypokalemia 07/04/2018  . Lumbar compression fracture ([place]) 02/23/2018  . Essential hypertension 04/24/2009  . COPD (chronic obstructive pulmonary disease) ([place]) 04/24/2009    Resolved Hospital Problems  No resolved problems to display.    Discharge Condition: Improved  Diet recommendation: Regular  Vitals:   07/19/18 0927 07/19/18 1435  BP: (!) 145/69 128/69  Pulse: 68 79  Resp:  20  Temp:  98.3 F (36.8 C)  SpO2:  96%    History of present illness:   HPI: Jordan [name] is a 72 y.o. female with medical history significant for lumbar compression fracture, neuromuscular dysfunction of bladder and colon, CKD stage III, hypertension, dyslipidemia, IBS, COPD, and questionable recent history of C. difficile colitis with initiation of Flagyl approximately 3 to 4 days ago, who presented to the ED with worsening abdominal distention and apparently no bowel movement or urination since Friday of last week.  According to SNF notes, she was started on Flagyl for C. difficile on 10/24 at the facility.  The patient is overall a poor historian, but appears to state that she did not have any significant diarrhea or abdominal pain at that time.  She is noted to have some ongoing nausea but no vomiting and has maintained a poor appetite with poor oral intake.  She states that  she has been drinking plenty of water, but continues to remain very thirsty.  She denies any bloody bowel movements, fever, chills, chest pain, or shortness of breath. Hospital Course:  Principal Problem:   Megacolon due to infectious colitis Active Problems:   Essential hypertension   COPD (chronic obstructive pulmonary disease) (H[facility])   Lumbar compression fracture (H[facility])   AKI (acute kidney injury) ([place])   Hypokalemia   Pseudomembranous colitis   Pressure injury of skin   Protein-calorie malnutrition, severe   Paralytic ileus of large intestine (H[facility])   Ileus (HCC)   Clostridium difficile colitis 72 year old female with neuromuscular dysfunction of the bladder: Chronic kidney disease stage III hypertension dyslipidemia who was admitted with abdominal distention was found to have toxic megacolon due to infectious colitis.  She was started on vancomycin and Flagyl. GI was consulted ,initially there was the plan of doing a colonoscopy/ sigmoidoscopy but that was changed and a rectal tube was placed for decompression and that was successful.  She received rectal vancomycin irrigation from July 06, 2018 to July 18, 2018.  she was supposed to have it for 14 days as well as oral vancomycin for 14 days but the rectal vancomycin was discontinued today after she completed her dose yesterday.  Gastroenterology did recommend stopping the vancomycin rectal irrigation today.  She will complete a total dose of 14 days of vancomycin orally as well as Flagyl  Procedures:  Rectal tube placement and rectal irrigation with vancomycin  Consultations:  Gastroenterology Dr. Oneida Alar  Nephrology Dr. Lowanda Foster  Discharge Exam: BP 128/69  Pulse 79   Temp 98.3 F (36.8 C) (Oral)   Resp 20   Ht 5\' 2"  (1.575 m)   Wt 50.3 kg   SpO2 96%   BMI 20.28 kg/m   General: Alert oriented x3 chronically ill looking Cardiovascular: Regular rate and rhythm with no murmur Respiratory: Clear to auscultation  bilaterally Abdomen.  Soft nontender  No distention ,positive bowel sounds Extremity with no edema  Discharge Instructions You were cared for by a hospitalist during your hospital stay. If you have any questions about your discharge medications or the care you received while you were in the hospital after you are discharged, you can call the unit and asked to speak with the hospitalist on call if the hospitalist that took care of you is not available. Once you are discharged, your primary care physician will handle any further medical issues. Please note that NO REFILLS for any discharge medications will be authorized once you are discharged, as it is imperative that you return to your primary care physician (or establish a relationship with a primary care physician if you do not have one) for your aftercare needs so that they can reassess your need for medications and monitor your lab values.  Discharge Instructions    Call MD for:  temperature >100.4   Complete by:  As directed    Diet - low sodium heart healthy   Complete by:  As directed    Increase activity slowly   Complete by:  As directed      Allergies as of 07/19/2018      Reactions   Cardura [doxazosin Mesylate] Hives   Barbiturates Nausea And Vomiting, Rash   Levofloxacin Rash   Sulfa Antibiotics Nausea Only, Rash      Medication List    STOP taking these medications   amoxicillin-clavulanate 875-125 MG tablet Commonly known as:  AUGMENTIN     TAKE these medications   acetaminophen 325 MG tablet Commonly known as:  TYLENOL Take 650 mg by mouth 4 (four) times daily.   ALIGN 4 MG Caps Take 1 capsule by mouth daily.   AMINO ACIDS-PROTEIN HYDROLYS PO Take 30 mLs by mouth 2 (two) times daily.   amLODipine 5 MG tablet Commonly known as:  NORVASC Take 1 tablet (5 mg total) by mouth daily. Start taking on:  07/20/2018   budesonide-formoterol 160-4.5 MCG/ACT inhaler Commonly known as:  SYMBICORT Inhale 2 puffs into  the lungs 2 (two) times daily.   diclofenac sodium 1 % Gel Commonly known as:  VOLTAREN Apply 4 g topically 4 (four) times daily.   dicyclomine 20 MG tablet Commonly known as:  BENTYL Take 20 mg by mouth every 6 (six) hours.   docusate sodium 100 MG capsule Commonly known as:  COLACE Take 1 capsule (100 mg total) by mouth every 12 (twelve) hours. While taking oxycodone   ferrous sulfate 325 (65 FE) MG tablet Take 325 mg by mouth 2 (two) times daily with a meal.   FLUoxetine 40 MG capsule Commonly known as:  PROZAC Take 40 mg by mouth daily.   folic acid 1 MG tablet Commonly known as:  FOLVITE Take 1 mg by mouth daily.   gabapentin 400 MG capsule Commonly known as:  NEURONTIN Take 400 mg by mouth 2 (two) times daily.   guaifenesin 400 MG Tabs tablet Commonly known as:  HUMIBID E Take 400 mg by mouth 3 (three) times daily. For 10 days   HYDROcodone-acetaminophen 5-325 MG tablet Commonly known as:  NORCO/VICODIN Take 2 tablets by mouth every 6 (six) hours as needed for moderate pain.   ipratropium-albuterol 0.5-2.5 (3) MG/3ML Soln Commonly known as:  DUONEB Take 3 mLs by nebulization every 6 (six) hours as needed (SOB).   Lidocaine 4 % Ptch Apply 1 patch topically daily. Left hip   lisinopril 20 MG tablet Commonly known as:  PRINIVIL,ZESTRIL Take 20 mg by mouth daily.   Melatonin 3 MG Tabs Take 6 mg by mouth at bedtime.   metoprolol tartrate 25 MG tablet Commonly known as:  LOPRESSOR Take 1 tablet (25 mg total) by mouth 2 (two) times daily.   metroNIDAZOLE 500 MG tablet Commonly known as:  FLAGYL Take 500 mg by mouth 3 (three) times daily.   mirtazapine 15 MG tablet Commonly known as:  REMERON Take 15 mg by mouth at bedtime.   montelukast 10 MG tablet Commonly known as:  SINGULAIR Take 10 mg by mouth daily.   ondansetron 4 MG disintegrating tablet Commonly known as:  ZOFRAN-ODT Take 4 mg by mouth every 4 (four) hours as needed for nausea or  vomiting.   OXYGEN Inhale 2 L into the lungs as needed (TO MAINTAIN O2 LEVELS).   pantoprazole 40 MG tablet Commonly known as:  PROTONIX Take 40 mg by mouth daily.   polycarbophil 625 MG tablet Commonly known as:  FIBERCON Take 625 mg by mouth daily. What changed:  Another medication with the same name was removed. Continue taking this medication, and follow the directions you see here.   predniSONE 10 MG tablet Commonly known as:  DELTASONE Take 5 mg by mouth daily with breakfast.   sodium bicarbonate 650 MG tablet Take 1 tablet (650 mg total) by mouth 3 (three) times daily.   thiamine 100 MG tablet Take 100 mg by mouth daily.   tiZANidine 2 MG tablet Commonly known as:  ZANAFLEX Take 2 mg by mouth every 8 (eight) hours as needed for muscle spasms.   vancomycin 50 mg/mL  oral solution Commonly known as:  VANCOCIN Take 10 mLs (500 mg total) by mouth every 6 (six) hours for 2 days.   vitamin C 500 MG tablet Commonly known as:  ASCORBIC ACID Take 500 mg by mouth 2 (two) times daily.      Allergies  Allergen Reactions  . Cardura [Doxazosin Mesylate] Hives  . Barbiturates Nausea And Vomiting and Rash  . Levofloxacin Rash  . Sulfa Antibiotics Nausea Only and Rash      The results of significant diagnostics from this hospitalization (including imaging, microbiology, ancillary and laboratory) are listed below for reference.    Significant Diagnostic Studies: Ct Abdomen Pelvis Wo Contrast  Result Date: 07/04/2018 CLINICAL DATA:  Abdominal distension, nausea, on Flagyl for C diff EXAM: CT ABDOMEN AND PELVIS WITHOUT CONTRAST TECHNIQUE: Multidetector CT imaging of the abdomen and pelvis was performed following the standard protocol without IV contrast. COMPARISON:  05/05/2017 FINDINGS: Lower chest: Small bilateral pleural effusions. Hepatobiliary: Unenhanced liver is unremarkable. Status post cholecystectomy. No intrahepatic or extrahepatic ductal dilatation. Pancreas:  Within normal limits. Spleen: Within normal limits. Adrenals/Urinary Tract: Adrenal glands within normal limits. Right renal cortical atrophy/scarring. 8 mm nonobstructing right lower pole renal calculus (series 2/image 29). No hydronephrosis. Bladder is mildly thick-walled although underdistended. Stomach/Bowel: Stomach is notable for a small hiatal hernia. No evidence of small bowel obstruction. Diffuse colonic dilatation, measuring up to 9.4 cm in the cecum, which is mobile and located in the left mid abdomen (series 2/image 44). However, there is  no significant rectum may be mildly thick-walled (series 2/image 77), but this is likely exacerbated by underdistention. No pneumatosis or free air. Vascular/Lymphatic: No evidence of abdominal aortic aneurysm. Atherosclerotic calcifications of the abdominal aorta and branch vessels. No suspicious abdominopelvic lymphadenopathy. Reproductive: Uterus is unremarkable. No adnexal masses. Other: Small volume perihepatic ascites. Trace pelvic ascites. Musculoskeletal: Prior vertebral augmentation at L4. Moderate compression fracture deformity at T12. Mild compression fracture deformity at L1. These findings are unchanged. Mild degenerative changes of the lumbar spine with dextroscoliosis. IMPRESSION: Diffuse colonic dilatation, without convincing wall thickening or pericolonic inflammatory changes despite the known history of C difficile colitis. As such, the overall appearance favors adynamic/paralytic colonic ileus. No pneumatosis or free air.  No evidence of small bowel obstruction. Small volume abdominopelvic ascites. Small bilateral pleural effusions. Electronically Signed   By: Julian Hy M.D.   On: 07/04/2018 14:23   Dg Abd 1 View  Result Date: 07/06/2018 CLINICAL DATA:  72 year old female with ileus. Abdominal pain. History kidney stones. Subsequent encounter. EXAM: ABDOMEN - 1 VIEW COMPARISON:  07/05/2018 plain film examination.  07/04/2018 CT. FINDINGS:  Marked gas distended small and large bowel with cecum displaced medially and superiorly. Degree of distention has progressed since prior plain film examination with cecum now spanning over 15 cm versus prior 14 cm. This may represent colonic ileus as previously noted. Underlying inflammatory process or obstructing lesion were felt to be less likely considerations on prior CT. These possibilities cannot be adequately assessed on plain film exam. The possibility of free intraperitoneal air cannot be assessed on a supine view. Prior cement augmentation L4. Scoliosis lumbar spine with superimposed degenerative changes. IMPRESSION: Progressive gas distended bowel. The medially and superiorly displaced distended cecum now spans over 15 cm versus prior 14 cm. Please see above. These results will be called to the ordering clinician or representative by the Radiologist Assistant, and communication documented in the PACS or zVision Dashboard. Electronically Signed   By: Genia Del M.D.   On: 07/06/2018 09:02   Dg Abd 2 Views  Result Date: 07/05/2018 CLINICAL DATA:  Paralytic ileus large intestine EXAM: ABDOMEN - 2 VIEW COMPARISON:  07/04/2018 FINDINGS: Progressive colonic dilatation which is now severe. Negative for free air. Scoliosis and vertebroplasty L4 IMPRESSION: Progression of colonic dilatation compatible with ileus. Electronically Signed   By: Franchot Gallo M.D.   On: 07/05/2018 18:46   Dg Abd Acute W/chest  Result Date: 07/04/2018 CLINICAL DATA:  Abdominal pain, distension EXAM: DG ABDOMEN ACUTE W/ 1V CHEST COMPARISON:  Chest radiograph dated 04/27/2017 FINDINGS: Lungs are clear.  No pleural effusion or pneumothorax. The heart is normal in size. Colonic distension, suggesting adynamic colonic ileus. No findings to suggest small bowel obstruction. No evidence of free air under the diaphragm on the upright view. Mild degenerative changes of the lumbar spine with lumbar dextroscoliosis. Vertebral  augmentation at L4. Cholecystectomy clips. IMPRESSION: No evidence of acute cardiopulmonary disease. Suspected adynamic colonic ileus. No findings to suggest small bowel obstruction or free air. Electronically Signed   By: Julian Hy M.D.   On: 07/04/2018 09:20    Microbiology: No results found for this or any previous visit (from the past 240 hour(s)).   Labs: Basic Metabolic Panel: Recent Labs  Lab 07/13/18 0448 07/14/18 0506 07/15/18 0518 07/16/18 1250 07/17/18 0637 07/18/18 0609  NA 137 141  141 142 139 141 142  K 3.9 3.5  3.5 3.1* 3.7 3.2* 3.6  CL 115* 116*  117* 119* 115* 117* 116*  CO2 16* 20*  20* 17* 21* 21* 22  GLUCOSE 96 88  88 98 169* 94 92  BUN 23 21  20 20 22  24* 24*  CREATININE 1.29* 1.25*  1.30* 1.35* 1.18* 1.24* 1.17*  CALCIUM 7.6* 7.9*  7.9* 7.7* 7.7* 7.9* 7.8*  MG 1.9 1.6* 2.0  --   --  1.6*  PHOS 3.5 3.2  3.1 2.4*  --   --   --    Liver Function Tests: Recent Labs  Lab 07/13/18 0448 07/14/18 0506 07/15/18 0518  ALBUMIN 1.9* 2.1*  2.1* 2.0*   No results for input(s): LIPASE, AMYLASE in the last 168 hours. No results for input(s): AMMONIA in the last 168 hours. CBC: Recent Labs  Lab 07/13/18 0658 07/14/18 0506 07/15/18 0518 07/18/18 0609  WBC 6.1 7.4 8.1 5.7  NEUTROABS 4.9 5.9 6.7 3.6  HGB 9.3* 10.9* 9.9* 9.0*  HCT 31.9* 37.8 33.5* 30.5*  MCV 102.2* 106.8* 104.7* 104.1*  PLT 148* 180 181 184   Cardiac Enzymes: No results for input(s): CKTOTAL, CKMB, CKMBINDEX, TROPONINI in the last 168 hours. BNP: BNP (last 3 results) No results for input(s): BNP in the last 8760 hours.  ProBNP (last 3 results) No results for input(s): PROBNP in the last 8760 hours.  CBG: No results for input(s): GLUCAP in the last 168 hours.     Signed:  Cristal Deer, MD [facility] 07/19/2018, 3:23 PM

## 2018-07-19 NOTE — Clinical Social Work Note (Signed)
Discharge summary sent to facility. RN to call report and EMS when patient is ready.  Message left for friend listed on chart advising of discharge.   LCSW signing off.

## 2018-07-19 NOTE — Progress Notes (Addendum)
Report called to [facility]. All paperwork and prescriptions placed in packet. EMS called for transport. Patient PIV x1 removed, and she was dressed with assistance. Awaiting transport.  1752: patient discharged to [facility] via EMS. All belongings sent with her.

## 2018-07-19 NOTE — Progress Notes (Signed)
    Subjective: No abdominal pain. Stool soft. Doesn't want to pursue vanc enemas any further. Would like to be discharged.  Unable to retain vanc enemas.   Objective: Vital signs in last 24 hours: Temp:  [98 F (36.7 C)-98.4 F (36.9 C)] 98.4 F (36.9 C) (11/11 0531) Pulse Rate:  [67-72] 67 (11/11 0531) Resp:  [18] 18 (11/11 0531) BP: (122-143)/(54-73) 143/73 (11/11 0531) SpO2:  [98 %-100 %] 98 % (11/11 0747) Last BM Date: 07/18/18 General:   Alert and oriented, pleasant Head:  Normocephalic and atraumatic. Abdomen:  Bowel sounds present, soft, non-tender, non-distended. No HSM or hernias noted. No rebound or guarding. No masses appreciated  Msk:  Symmetrical without gross deformities. Normal posture. Neurologic:  Alert and  oriented x4 Psych:   Normal mood and affect.  Intake/Output from previous day: 11/10 0701 - 11/11 0700 In: 723 [P.O.:720; I.V.:3] Out: -  Intake/Output this shift: No intake/output data recorded.  Lab Results: Recent Labs    07/18/18 0609  WBC 5.7  HGB 9.0*  HCT 30.5*  PLT 184   BMET Recent Labs    07/16/18 1250 07/17/18 0637 07/18/18 0609  NA 139 141 142  K 3.7 3.2* 3.6  CL 115* 117* 116*  CO2 21* 21* 22  GLUCOSE 169* 94 92  BUN 22 24* 24*  CREATININE 1.18* 1.24* 1.17*  CALCIUM 7.7* 7.9* 7.8*     Assessment: 72 year old female admitted with megacolon in setting of CDI, clinically improving. Vancomycin oral/per rectum started Oct 29th, Flagyl IV started Oct 30th. Previously recommended 10-14 day course of oral vancomycin and 14 day course of vanc enemas (enemas ending 11/12 midday). Flagyl for 10-14 days. She is weary of vanc enemas, declining now, and unable to retain enemas. She has more than completed a 10+day course of vanc enemas, and guidelines state 10 days at minimum with 14 if needed depending on patient's response. As she is declining enemas now, would not be unreasonable to discontinue them and discharge her to skilled nursing  as previously planned. Recommend completing full 14 day course of oral vanc and flagyl.   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   Plan: Consider discontinuing vanc enemas From a GI standpoint, she is stable  Will arrange outpatient follow-up  Signing off   Annitta Needs, PhD, ANP-BC [facility]    LOS: 15 days    07/19/2018, 8:15 AM

## 2018-07-19 NOTE — Telephone Encounter (Signed)
Please have patient return for outpatient hospital follow-up in 2 months.

## 2018-07-20 ENCOUNTER — Encounter: Payer: Self-pay | Admitting: Internal Medicine

## 2018-07-20 NOTE — Telephone Encounter (Signed)
PATIENT SCHEDULED  °

## 2018-07-22 DIAGNOSIS — J441 Chronic obstructive pulmonary disease with (acute) exacerbation: Secondary | ICD-10-CM | POA: Diagnosis not present

## 2018-07-22 DIAGNOSIS — J189 Pneumonia, unspecified organism: Secondary | ICD-10-CM | POA: Diagnosis not present

## 2018-07-22 DIAGNOSIS — A0472 Enterocolitis due to Clostridium difficile, not specified as recurrent: Secondary | ICD-10-CM | POA: Diagnosis not present

## 2018-07-22 DIAGNOSIS — I129 Hypertensive chronic kidney disease with stage 1 through stage 4 chronic kidney disease, or unspecified chronic kidney disease: Secondary | ICD-10-CM | POA: Diagnosis not present

## 2018-07-25 ENCOUNTER — Emergency Department (HOSPITAL_COMMUNITY)
Admission: EM | Admit: 2018-07-25 | Discharge: 2018-07-25 | Disposition: A | Discharge status: Home / Self Care | Payer: Medicare Other | Attending: Emergency Medicine | Admitting: Emergency Medicine

## 2018-07-25 ENCOUNTER — Encounter (HOSPITAL_COMMUNITY): Payer: Self-pay

## 2018-07-25 ENCOUNTER — Other Ambulatory Visit: Payer: Self-pay

## 2018-07-25 DIAGNOSIS — J449 Chronic obstructive pulmonary disease, unspecified: Secondary | ICD-10-CM | POA: Diagnosis not present

## 2018-07-25 DIAGNOSIS — E785 Hyperlipidemia, unspecified: Secondary | ICD-10-CM | POA: Insufficient documentation

## 2018-07-25 DIAGNOSIS — Z79899 Other long term (current) drug therapy: Secondary | ICD-10-CM | POA: Diagnosis not present

## 2018-07-25 DIAGNOSIS — Z8673 Personal history of transient ischemic attack (TIA), and cerebral infarction without residual deficits: Secondary | ICD-10-CM | POA: Diagnosis not present

## 2018-07-25 DIAGNOSIS — I252 Old myocardial infarction: Secondary | ICD-10-CM | POA: Diagnosis not present

## 2018-07-25 DIAGNOSIS — R682 Dry mouth, unspecified: Secondary | ICD-10-CM | POA: Diagnosis present

## 2018-07-25 DIAGNOSIS — K117 Disturbances of salivary secretion: Secondary | ICD-10-CM

## 2018-07-25 DIAGNOSIS — I10 Essential (primary) hypertension: Secondary | ICD-10-CM | POA: Diagnosis not present

## 2018-07-25 MED ORDER — BIOTENE DRY MOUTH MT LIQD: 15.0000 mL | Freq: Once | OROMUCOSAL | Status: DC

## 2018-07-25 MED ORDER — LACTATED RINGERS IV BOLUS
500.0000 mL | Freq: Once | INTRAVENOUS | Status: AC
  Administered 2018-07-25: 500 mL via INTRAVENOUS

## 2018-07-25 MED ORDER — BIOTENE DRY MOUTH MT LIQD: 10.0000 mL | Freq: Four times a day (QID) | OROMUCOSAL | 0 refills | Status: DC | PRN

## 2018-07-25 NOTE — ED Triage Notes (Signed)
Pt brought in by EMS from [place] home. Pt reports that her upper lip felt like it started swelling last night. Pt reports that her mouth is burning and is very dry. Pt reports completing vancomycin for Cdiff 2 days ago

## 2018-07-25 NOTE — ED Notes (Signed)
Dry mucous membranes, dry cracked lips

## 2018-07-25 NOTE — ED Notes (Signed)
Pt leaving with EMS

## 2018-07-25 NOTE — ED Provider Notes (Signed)
[facility] EMERGENCY DEPARTMENT Provider Note   CSN: [id number] Arrival date & time: 07/25/18  3299     History   Chief Complaint Chief Complaint  Patient presents with  . Oral Swelling    HPI Jordan [name] is a 72 y.o. female.  HPI   63yF with dry mouth and burning lips. Began yesterday. Felt lips burning/painful. Sensation that they feel swollen. Mouth dry and tip of tongue also sore. Persistent through the night and into today. Recently admitted for megacolon from infectious colitis and finished abx. She says she feels like she has been recovering well from this standpoint. She denies skin rash. No sore throat. No irritation of mucus membranes otherwise.   Past Medical History:  Diagnosis Date  . Allergy    seasonal  . Anemia   . Arthritis   . Asthma    uses Advair and Spiriva daily,Albuterol prn.Takes Singulair at bedtime and Prednisone daily  . Back spasm    takes Zanaflex daily as needed  . Blood transfusion 2007   no abnormal reation noted   . Bruises easily    d/t being on Prednisone  . Bursitis    left elbow  . C. difficile colitis    2007: treated with Flagyl and Vanc, 2014: treated with vanc, Aug 2014: Vanc taper  . Chronic back pain   . Chronic kidney disease    "creatine" creeping up - followed at this time by PCP  . Complication of anesthesia 2000   woke up during one surgery- ovary surgery  . COPD (chronic obstructive pulmonary disease) ([place])   . Depression    takes Prozac daily  . Dysphasia   . Falls   . H/O hiatal hernia   . Hemorrhoids   . History of bronchitis    last time a yr ago  . History of colon polyps   . History of kidney stones   . History of kidney stones   . Hyperlipidemia    hx of-was on meds but has been off x 1 1/2 yrs  . Hypertension    takes Verapamil nightly  . IBS (irritable bowel syndrome)   . Insomnia    takes Benadryl as needed  . 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 Neurogenic bowel    . Neuromuscular dysfunction of bladder, unspecified   . Nocturia   . Osteoporosis    takes Fosamax weekly  . Pneumonia    MRSA pneumonia in 2007  . Shortness of breath    with exertion daily  . Stroke [facility])    "they say I has a small stroke".No deficits  . Tingling    and pain in left leg  . Urinary frequency     Patient Active Problem List   Diagnosis Date Noted  . Ileus ([place])   . Clostridium difficile colitis   . Paralytic ileus of large intestine ([place])   . Pressure injury of skin 07/09/2018  . Protein-calorie malnutrition, severe 07/09/2018  . Pseudomembranous colitis   . 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 AKI (acute kidney injury) ([place]) 07/04/2018  . Hypokalemia 07/04/2018  . Lumbar compression fracture ([place]) 02/23/2018  . Anemia 02/20/2017  . History of colon polyps 02/20/2017  . Chronic diarrhea 02/20/2017  . Abnormal weight loss 02/20/2017  . Carotid stenosis, symptomatic w/o infarct 01/01/2016  . Carotid stenosis 11/27/2015  . Ganglion cyst of wrist   . Hematoma 01/23/2014  . Lumbar foraminal stenosis 10/17/2013  . Olecranon  bursitis of left elbow 09/14/2013  . Left elbow pain 09/14/2013  . C. difficile diarrhea 06/20/2013  . Loose stools 03/23/2013  . 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 PALPITATIONS 05/04/2009  . CHEST PAIN UNSPECIFIED 05/04/2009  . Essential hypertension 04/24/2009  . ASTHMA 04/24/2009  . COPD (chronic obstructive pulmonary disease) ([place]) 04/24/2009    Past Surgical History:  Procedure Laterality Date  . APPENDECTOMY    . BIOPSY  04/13/2017   Procedure: BIOPSY;  Surgeon: Daneil Dolin, MD;  Location[facility] ENDO [facility];  Service: Endoscopy;;  right and left colon   . BIOPSY  07/06/2018   Procedure: BIOPSY;  Surgeon: Danie Binder, MD;  Location: [facility] ENDO [facility]ITE;  Service: Endoscopy;;  Random Colon  . CARPAL TUNNEL RELEASE Left   . CARPAL TUNNEL RELEASE Right 09/21/2015   Procedure: CARPAL TUNNEL RELEASE;  Surgeon: Carole Civil, MD;  Location: [facility] ORS;  Service: Orthopedics;  Laterality: Right;  . cataract surgery Bilateral   . CHOLECYSTECTOMY    . COLONOSCOPY   09/24/2006   RMR: Normal rectum, left-sided diverticula  . COLONOSCOPY  Oct 2012   Dr. Henrene Pastor: tubular adenomas, moderative diverticulosis, internal hemorrhoids  . COLONOSCOPY WITH PROPOFOL N/A 04/13/2017   Dr. Gala Romney: internal hemorrhoids, diverticulosis  . CYSTOSCOPY    . ENDARTERECTOMY Right 12/12/2015   Procedure: ENDARTERECTOMY RIGHT CAROTID, RESECTION OF REDUNDANT RIGHT COMMON CAROTID ARTERY WITH PRIMARY REANASTOMOSIS;  Surgeon: Mal Misty, MD;  Location: [place];  Service: Vascular;  Laterality: Right;  . ESOPHAGOGASTRODUODENOSCOPY  04/07/2006   CWC:BJSEGB esophagus s/p placement of Bravo pH probe, some surgical changes but not typical of fundoplication wrap, may have slipped  . FLEXIBLE SIGMOIDOSCOPY N/A 07/06/2018   Procedure: FLEXIBLE SIGMOIDOSCOPY WITH PROPOFOL;  Surgeon: Danie Binder, MD;  Location: [facility];  Service: Endoscopy;  Laterality: N/A;  needs to be done first before the other inpatient   . GANGLION CYST EXCISION Right 09/21/2015   Procedure: RIGHT WRIST GANGLION CYST REMOVAL;  Surgeon: Carole Civil, MD;  Location: [facility] ORS;  Service: Orthopedics;  Laterality: Right;  . hemorrhoidal banding    . HERNIA REPAIR     umbilical  . KNEE ARTHROSCOPY Right   . KYPHOPLASTY N/A 02/23/2018   Procedure: KYPHOPLASTY LUMBAR FOUR;  Surgeon: Newman Pies, MD;  Location: [place];  Service: Neurosurgery;  Laterality: N/A;  . LUMBAR LAMINECTOMY/DECOMPRESSION MICRODISCECTOMY Left 10/17/2013   Procedure: LUMBAR LAMINECTOMY/DECOMPRESSION MICRODISCECTOMY 1 LEVEL three/four;  Surgeon: Ophelia Charter, MD;  Location: [place] NEURO ORS;  Service: Neurosurgery;  Laterality: Left;  . NECK SURGERY     fusion-   . NISSEN FUNDOPLICATION     x 2  . OLECRANON BURSECTOMY Left 01/11/2014   Procedure: OLECRANON BURSECTOMY;  Surgeon: Carole Civil,  MD;  Location: [facility] OR[facility];  Service: Orthopedics;  Laterality: Left;  . OOPHORECTOMY Right   . PATCH ANGIOPLASTY Right 12/12/2015   Procedure: PATCH ANGIOPLASTY RIGHT CAROTID ARTERY USING HEMASHIELD PLATINUM FINESSE PATCH;  Surgeon: Mal Misty, MD;  Location: [place];  Service: Vascular;  Laterality: Right;  . TONSILLECTOMY       OB History   None      Home Medications    Prior to Admission medications   Medication Sig Start Date End Date Taking? Authorizing Provider  acetaminophen (TYLENOL) 325 MG tablet Take 650 mg by mouth 4 (four) times daily.    [provider]  AMINO ACIDS-PROTEIN HYDROLYS PO Take 30 mLs by mouth 2 (two) times daily.  [provider]  amLODipine (NORVASC) 5 MG tablet Take 1 tablet (5 mg total) by mouth daily. 07/20/18   Cristal Deer, MD  budesonide-formoterol [facility]) 160-4.5 MCG/ACT inhaler Inhale 2 puffs into the lungs 2 (two) times daily.    [provider]  diclofenac sodium (VOLTAREN) 1 % GEL Apply 4 g topically 4 (four) times daily.    [provider]  dicyclomine (BENTYL) 20 MG tablet Take 20 mg by mouth every 6 (six) hours.    [provider]  docusate sodium (COLACE) 100 MG capsule Take 1 capsule (100 mg total) by mouth every 12 (twelve) hours. While taking oxycodone 01/21/18   Mesner, Corene Cornea, MD  ferrous sulfate 325 (65 FE) MG tablet Take 325 mg by mouth 2 (two) times daily with a meal.    [provider]  FLUoxetine (PROZAC) 40 MG capsule Take 40 mg by mouth daily.      [provider]  folic acid (FOLVITE) 1 MG tablet Take 1 mg by mouth daily.    [provider]  gabapentin (NEURONTIN) 400 MG capsule Take 400 mg by mouth 2 (two) times daily.    [provider]  guaifenesin (HUMIBID E) 400 MG TABS tablet Take 400 mg by mouth 3 (three) times daily. For 10 days    [provider]  HYDROcodone-acetaminophen (NORCO/VICODIN) 5-325 MG tablet Take 2 tablets by mouth  every 6 (six) hours as needed for moderate pain. 02/24/18   Eustace Moore, MD  ipratropium-albuterol (DUONEB) 0.5-2.5 (3) MG/3ML SOLN Take 3 mLs by nebulization every 6 (six) hours as needed (SOB).    [provider]  Lidocaine 4 % PTCH Apply 1 patch topically daily. Left hip    [provider]  lisinopril (PRINIVIL,ZESTRIL) 20 MG tablet Take 20 mg by mouth daily.    [provider]  Melatonin 3 MG TABS Take 6 mg by mouth at bedtime.    [provider]  metoprolol tartrate (LOPRESSOR) 25 MG tablet Take 1 tablet (25 mg total) by mouth 2 (two) times daily. 07/19/18   Cristal Deer, MD  metroNIDAZOLE (FLAGYL) 500 MG tablet Take 500 mg by mouth 3 (three) times daily.    [provider]  mirtazapine (REMERON) 15 MG tablet Take 15 mg by mouth at bedtime.    [provider]  montelukast (SINGULAIR) 10 MG tablet Take 10 mg by mouth daily.     [provider]  ondansetron (ZOFRAN-ODT) 4 MG disintegrating tablet Take 4 mg by mouth every 4 (four) hours as needed for nausea or vomiting.    [provider]  OXYGEN Inhale 2 L into the lungs as needed (TO MAINTAIN O2 LEVELS).    [provider]  pantoprazole (PROTONIX) 40 MG tablet Take 40 mg by mouth daily.    [provider]  polycarbophil (FIBERCON) 625 MG tablet Take 625 mg by mouth daily.    [provider]  predniSONE (DELTASONE) 10 MG tablet Take 5 mg by mouth daily with breakfast.     [provider]  Probiotic Product (ALIGN) 4 MG CAPS Take 1 capsule by mouth daily.    [provider]  sodium bicarbonate 650 MG tablet Take 1 tablet (650 mg total) by mouth 3 (three) times daily. 07/19/18   Cristal Deer, MD  thiamine 100 MG tablet Take 100 mg by mouth daily.    [provider]  tiZANidine (ZANAFLEX) 2 MG tablet Take 2 mg by mouth every 8 (eight) hours as  needed for muscle spasms.    [provider]  vitamin C  (ASCORBIC ACID) 500 MG tablet Take 500 mg by mouth 2 (two) times daily.    [provider]    Family History Family History  Problem Relation Age of Onset  . Heart disease Father   . Colon cancer Neg Hx     Social History Social History   Tobacco Use  . Smoking status: Never Smoker  . Smokeless tobacco: Never Used  Substance Use Topics  . Alcohol use: Not Currently    Alcohol/week: 10.0 standard drinks    Types: 10 Cans of beer per week    Comment: couple of beers daily  . Drug use: No     Allergies   Cardura [doxazosin mesylate]; Barbiturates; Levofloxacin; and Sulfa antibiotics   Review of Systems Review of Systems  All systems reviewed and negative, other than as noted in HPI.  Physical Exam Updated Vital Signs BP (!) 114/55 (BP Location: Right Arm)   Pulse 71   Temp 100.1 F (37.8 C) (Oral)   Resp 18   SpO2 94%   Physical Exam  Constitutional: She appears well-developed and well-nourished. No distress.  HENT:  Head: Normocephalic and atraumatic.  Eyes: Right eye exhibits no discharge. Left eye exhibits no discharge.  Lips appear dry and some cracking at corners of mouth. No swelling. Tip of tongue also with dry/rough texture. Oropharynx otherwise clear. Normal sounding voice. Neck supple.   Neck: Neck supple.  Cardiovascular: Normal rate, regular rhythm and normal heart sounds. Exam reveals no gallop and no friction rub.  No murmur heard. Pulmonary/Chest: Effort normal and breath sounds normal. No respiratory distress.  Abdominal: Soft. She exhibits no distension. There is no tenderness.  Musculoskeletal: She exhibits no edema or tenderness.  Neurological: She is alert.  Skin: Skin is warm and dry.  Psychiatric: She has a normal mood and affect. Her behavior is normal. Thought content normal.  Nursing note and vitals reviewed.    ED Treatments / Results  Labs (all labs ordered are listed, but only abnormal results are displayed) Labs  Reviewed - No data to display  EKG None  Radiology No results found.  Procedures Procedures (including critical care time)  Medications Ordered in ED Medications - No data to display   Initial Impression / Assessment and Plan / ED Course  I have reviewed the triage vital signs and the nursing notes.  Pertinent labs & imaging results that were available during my care of the patient were reviewed by me and considered in my medical decision making (see chart for details).     71yF with xerostomia. Lips and tip of tongue dry with some cracking at the corners of the mouth. I do not see any discrete oral lesions otherwise. No appreciable swelling/angioedema. Speech clear. Neck supple. Pharynx clear. No obvious thrush. No other acute skin rash. She is not on chemo. Unclear etiology. Will treat symptomatically at this time.   She reports she has otherwise been recovering well from her recent hospitalization. She has had symptoms for less than a day at this point. I do not appreciate any swelling/angioedema. Involvement of only lips and tip of tongue at this point. Needs to watch for possible progression of symptoms/decreased oral intake.   Given 500cc of LR. Start biotene mouth rinse and vaseline PRN for dry mouth/lips. Will have her hold bentyl and zanaflex for the time being since the anticholinergic effects can contribute to dry mouth.  Final Clinical Impressions(s) / ED Diagnoses   Final diagnoses:  Xerostomia    ED Discharge Orders    None       Virgel Manifold, MD 07/25/18 [phone]

## 2018-07-25 NOTE — Discharge Instructions (Addendum)
Stop bentyl (dicyclomine) and zanaflex (tizanidine) for 10 days. These can potentially dry your mouth out more. Use vaseline or similar petroleum based product for dry lips as needed. Use biotene mouth rinse as needed for mouth discomfort/dryness. I want you rechecked if you symptoms do not begin to improve in the next 2-3 days, if they worsen or if it becomes too painful for your to drink.

## 2018-07-26 DIAGNOSIS — I129 Hypertensive chronic kidney disease with stage 1 through stage 4 chronic kidney disease, or unspecified chronic kidney disease: Secondary | ICD-10-CM | POA: Diagnosis not present

## 2018-07-26 DIAGNOSIS — R109 Unspecified abdominal pain: Secondary | ICD-10-CM | POA: Diagnosis not present

## 2018-07-26 DIAGNOSIS — A0472 Enterocolitis due to Clostridium difficile, not specified as recurrent: Secondary | ICD-10-CM | POA: Diagnosis not present

## 2018-07-26 DIAGNOSIS — J449 Chronic obstructive pulmonary disease, unspecified: Secondary | ICD-10-CM | POA: Diagnosis not present

## 2018-07-27 DIAGNOSIS — J449 Chronic obstructive pulmonary disease, unspecified: Secondary | ICD-10-CM | POA: Diagnosis not present

## 2018-07-27 DIAGNOSIS — A0472 Enterocolitis due to Clostridium difficile, not specified as recurrent: Secondary | ICD-10-CM | POA: Diagnosis not present

## 2018-07-27 DIAGNOSIS — T783XXA Angioneurotic edema, initial encounter: Secondary | ICD-10-CM | POA: Diagnosis not present

## 2018-07-28 DIAGNOSIS — J449 Chronic obstructive pulmonary disease, unspecified: Secondary | ICD-10-CM | POA: Diagnosis not present

## 2018-07-28 DIAGNOSIS — A0472 Enterocolitis due to Clostridium difficile, not specified as recurrent: Secondary | ICD-10-CM | POA: Diagnosis not present

## 2018-07-28 DIAGNOSIS — F329 Major depressive disorder, single episode, unspecified: Secondary | ICD-10-CM | POA: Diagnosis not present

## 2018-07-28 DIAGNOSIS — R52 Pain, unspecified: Secondary | ICD-10-CM | POA: Diagnosis not present

## 2018-07-29 DIAGNOSIS — A0472 Enterocolitis due to Clostridium difficile, not specified as recurrent: Secondary | ICD-10-CM | POA: Diagnosis not present

## 2018-07-29 DIAGNOSIS — T783XXA Angioneurotic edema, initial encounter: Secondary | ICD-10-CM | POA: Diagnosis not present

## 2018-07-29 DIAGNOSIS — J441 Chronic obstructive pulmonary disease with (acute) exacerbation: Secondary | ICD-10-CM | POA: Diagnosis not present

## 2018-08-03 DIAGNOSIS — I129 Hypertensive chronic kidney disease with stage 1 through stage 4 chronic kidney disease, or unspecified chronic kidney disease: Secondary | ICD-10-CM | POA: Diagnosis not present

## 2018-08-03 DIAGNOSIS — D649 Anemia, unspecified: Secondary | ICD-10-CM | POA: Diagnosis not present

## 2018-08-03 DIAGNOSIS — E559 Vitamin D deficiency, unspecified: Secondary | ICD-10-CM | POA: Diagnosis not present

## 2018-08-03 DIAGNOSIS — N183 Chronic kidney disease, stage 3 (moderate): Secondary | ICD-10-CM | POA: Diagnosis not present

## 2018-08-12 DIAGNOSIS — N183 Chronic kidney disease, stage 3 (moderate): Secondary | ICD-10-CM | POA: Diagnosis not present

## 2018-08-12 DIAGNOSIS — I129 Hypertensive chronic kidney disease with stage 1 through stage 4 chronic kidney disease, or unspecified chronic kidney disease: Secondary | ICD-10-CM | POA: Diagnosis not present

## 2018-08-12 DIAGNOSIS — R109 Unspecified abdominal pain: Secondary | ICD-10-CM | POA: Diagnosis not present

## 2018-08-12 DIAGNOSIS — J449 Chronic obstructive pulmonary disease, unspecified: Secondary | ICD-10-CM | POA: Diagnosis not present

## 2018-08-13 DIAGNOSIS — D631 Anemia in chronic kidney disease: Secondary | ICD-10-CM | POA: Diagnosis not present

## 2018-08-13 DIAGNOSIS — N184 Chronic kidney disease, stage 4 (severe): Secondary | ICD-10-CM | POA: Diagnosis not present

## 2018-08-16 DIAGNOSIS — R600 Localized edema: Secondary | ICD-10-CM | POA: Diagnosis not present

## 2018-08-18 DIAGNOSIS — J449 Chronic obstructive pulmonary disease, unspecified: Secondary | ICD-10-CM | POA: Diagnosis not present

## 2018-08-18 DIAGNOSIS — D649 Anemia, unspecified: Secondary | ICD-10-CM | POA: Diagnosis not present

## 2018-08-18 DIAGNOSIS — M1990 Unspecified osteoarthritis, unspecified site: Secondary | ICD-10-CM | POA: Diagnosis not present

## 2018-08-18 DIAGNOSIS — I10 Essential (primary) hypertension: Secondary | ICD-10-CM | POA: Diagnosis not present

## 2018-08-23 DIAGNOSIS — I129 Hypertensive chronic kidney disease with stage 1 through stage 4 chronic kidney disease, or unspecified chronic kidney disease: Secondary | ICD-10-CM | POA: Diagnosis not present

## 2018-08-23 DIAGNOSIS — N183 Chronic kidney disease, stage 3 (moderate): Secondary | ICD-10-CM | POA: Diagnosis not present

## 2018-08-23 DIAGNOSIS — R600 Localized edema: Secondary | ICD-10-CM | POA: Diagnosis not present

## 2018-09-03 DIAGNOSIS — N184 Chronic kidney disease, stage 4 (severe): Secondary | ICD-10-CM | POA: Diagnosis not present

## 2018-09-03 DIAGNOSIS — D631 Anemia in chronic kidney disease: Secondary | ICD-10-CM | POA: Diagnosis not present

## 2018-09-08 DIAGNOSIS — M1990 Unspecified osteoarthritis, unspecified site: Secondary | ICD-10-CM | POA: Diagnosis not present

## 2018-09-08 DIAGNOSIS — A0472 Enterocolitis due to Clostridium difficile, not specified as recurrent: Secondary | ICD-10-CM | POA: Diagnosis not present

## 2018-09-08 DIAGNOSIS — J449 Chronic obstructive pulmonary disease, unspecified: Secondary | ICD-10-CM | POA: Diagnosis not present

## 2018-09-08 DIAGNOSIS — K148 Other diseases of tongue: Secondary | ICD-10-CM | POA: Diagnosis not present

## 2018-09-13 DIAGNOSIS — K148 Other diseases of tongue: Secondary | ICD-10-CM | POA: Diagnosis not present

## 2018-09-18 DIAGNOSIS — F331 Major depressive disorder, recurrent, moderate: Secondary | ICD-10-CM | POA: Diagnosis not present

## 2018-09-18 DIAGNOSIS — G478 Other sleep disorders: Secondary | ICD-10-CM | POA: Diagnosis not present

## 2018-09-23 DIAGNOSIS — Z79899 Other long term (current) drug therapy: Secondary | ICD-10-CM | POA: Diagnosis not present

## 2018-09-23 DIAGNOSIS — R7982 Elevated C-reactive protein (CRP): Secondary | ICD-10-CM | POA: Diagnosis not present

## 2018-09-23 DIAGNOSIS — N184 Chronic kidney disease, stage 4 (severe): Secondary | ICD-10-CM | POA: Diagnosis not present

## 2018-09-23 DIAGNOSIS — D631 Anemia in chronic kidney disease: Secondary | ICD-10-CM | POA: Diagnosis not present

## 2018-09-29 DIAGNOSIS — R52 Pain, unspecified: Secondary | ICD-10-CM | POA: Diagnosis not present

## 2018-09-29 DIAGNOSIS — J449 Chronic obstructive pulmonary disease, unspecified: Secondary | ICD-10-CM | POA: Diagnosis not present

## 2018-09-29 DIAGNOSIS — I10 Essential (primary) hypertension: Secondary | ICD-10-CM | POA: Diagnosis not present

## 2018-09-29 DIAGNOSIS — M1990 Unspecified osteoarthritis, unspecified site: Secondary | ICD-10-CM | POA: Diagnosis not present

## 2018-10-06 DIAGNOSIS — F4321 Adjustment disorder with depressed mood: Secondary | ICD-10-CM | POA: Diagnosis not present

## 2018-10-06 DIAGNOSIS — F320 Major depressive disorder, single episode, mild: Secondary | ICD-10-CM | POA: Diagnosis not present

## 2018-10-08 DIAGNOSIS — N184 Chronic kidney disease, stage 4 (severe): Secondary | ICD-10-CM | POA: Diagnosis not present

## 2018-10-08 DIAGNOSIS — D631 Anemia in chronic kidney disease: Secondary | ICD-10-CM | POA: Diagnosis not present

## 2018-10-18 ENCOUNTER — Encounter: Payer: Self-pay | Admitting: Gastroenterology

## 2018-10-18 ENCOUNTER — Ambulatory Visit (INDEPENDENT_AMBULATORY_CARE_PROVIDER_SITE_OTHER): Payer: Medicare Other | Admitting: Gastroenterology

## 2018-10-18 VITALS — BP 102/59 | HR 56 | Temp 96.7°F | Ht 62.0 in | Wt 110.0 lb

## 2018-10-18 DIAGNOSIS — A09 Infectious gastroenteritis and colitis, unspecified: Secondary | ICD-10-CM

## 2018-10-18 DIAGNOSIS — K5939 Other megacolon: Secondary | ICD-10-CM | POA: Diagnosis not present

## 2018-10-18 NOTE — Progress Notes (Signed)
Referring Provider: Sinda Du, MD Primary Care Physician:  Sinda Du, MD Primary GI: Dr. Gala Romney   Chief Complaint  Patient presents with  . Gas    HPI:   Jordan [name] is a 73 y.o. female presenting today with a history of megacolon in setting of CDI Nov 2019, seen while hospitalized. At high risk for recurrence, as she has a history of CDI in 2007 (treated with flagyl and vanc), 2014 X 2 treated with vancomycin during first episode in 2014 and vanc taper at time of recurrence.   States she is doing well on dicyclomine three times a day. Omeprazole 40 mg daily. Denies GERD. She is on low dose prednisone chronically. No abdominal pain. She has upcoming knee replacement surgery in March 2020. She is looking forward to increased mobility, as she is unable to bear weight and has decreased mobility. Overall, she is doing quite well considering hospitalization.   Past Medical History:  Diagnosis Date  . Allergy    seasonal  . Anemia   . Arthritis   . Asthma    uses Advair and Spiriva daily,Albuterol prn.Takes Singulair at bedtime and Prednisone daily  . Back spasm    takes Zanaflex daily as needed  . Blood transfusion 2007   no abnormal reation noted   . Bruises easily    d/t being on Prednisone  . Bursitis    left elbow  . C. difficile colitis    2007: treated with Flagyl and Vanc, 2014: treated with vanc, Aug 2014: Vanc taper  . Chronic back pain   . Chronic kidney disease    "creatine" creeping up - followed at this time by PCP  . Complication of anesthesia 2000   woke up during one surgery- ovary surgery  . COPD (chronic obstructive pulmonary disease) ([place])   . Depression    takes Prozac daily  . Dysphasia   . Falls   . H/O hiatal hernia   . Hemorrhoids   . History of bronchitis    last time a yr ago  . History of colon polyps   . History of kidney stones   . History of kidney stones   . Hyperlipidemia    hx of-was on meds but has been off x 1 1/2 yrs    . Hypertension    takes Verapamil nightly  . IBS (irritable bowel syndrome)   . Insomnia    takes Benadryl as needed  . 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 Neurogenic bowel   . Neuromuscular dysfunction of bladder, unspecified   . Nocturia   . Osteoporosis    takes Fosamax weekly  . Pneumonia    MRSA pneumonia in 2007  . Shortness of breath    with exertion daily  . Stroke [facility])    "they say I has a small stroke".No deficits  . Tingling    and pain in left leg  . Urinary frequency     Past Surgical History:  Procedure Laterality Date  . APPENDECTOMY    . BIOPSY  04/13/2017   Procedure: BIOPSY;  Surgeon: Daneil Dolin, MD;  [facility];  Service: Endoscopy;;  right and left colon   . BIOPSY  07/06/2018   Procedure: BIOPSY;  Surgeon: Danie Binder, MD;  [facility];  Service: Endoscopy;;  Random Colon  . CARPAL TUNNEL RELEASE Left   . CARPAL TUNNEL RELEASE Right 09/21/2015   Procedure: CARPAL TUNNEL RELEASE;  Surgeon: Carole Civil, MD;  Location: [facility];  Service: Orthopedics;  Laterality: Right;  . cataract surgery Bilateral   . CHOLECYSTECTOMY    . COLONOSCOPY   09/24/2006   RMR: Normal rectum, left-sided diverticula  . COLONOSCOPY  Oct 2012   Dr. Henrene Pastor: tubular adenomas, moderative diverticulosis, internal hemorrhoids  . COLONOSCOPY WITH PROPOFOL N/A 04/13/2017   Dr. Gala Romney: internal hemorrhoids, diverticulosis  . CYSTOSCOPY    . ENDARTERECTOMY Right 12/12/2015   Procedure: ENDARTERECTOMY RIGHT CAROTID, RESECTION OF REDUNDANT RIGHT COMMON CAROTID ARTERY WITH PRIMARY REANASTOMOSIS;  Surgeon: Mal Misty, MD;  Location: [place];  Service: Vascular;  Laterality: Right;  . ESOPHAGOGASTRODUODENOSCOPY  04/07/2006   MIW:OEHOZY esophagus s/p placement of Bravo pH probe, some surgical changes but not typical of fundoplication wrap, may have slipped  . FLEXIBLE SIGMOIDOSCOPY N/A 07/06/2018   Procedure: FLEXIBLE  SIGMOIDOSCOPY WITH PROPOFOL;  Surgeon: Danie Binder, MD;  [facility];  Service: Endoscopy;  Laterality: N/A;  needs to be done first before the other inpatient   . GANGLION CYST EXCISION Right 09/21/2015   Procedure: RIGHT WRIST GANGLION CYST REMOVAL;  Surgeon: Carole Civil, MD;  Location: [facility];  Service: Orthopedics;  Laterality: Right;  . hemorrhoidal banding    . HERNIA REPAIR     umbilical  . KNEE ARTHROSCOPY Right   . KYPHOPLASTY N/A 02/23/2018   Procedure: KYPHOPLASTY LUMBAR FOUR;  Surgeon: Newman Pies, MD;  Location: [place];  Service: Neurosurgery;  Laterality: N/A;  . LUMBAR LAMINECTOMY/DECOMPRESSION MICRODISCECTOMY Left 10/17/2013   Procedure: LUMBAR LAMINECTOMY/DECOMPRESSION MICRODISCECTOMY 1 LEVEL three/four;  Surgeon: Ophelia Charter, MD;  Location: [place] NEURO ORS;  Service: Neurosurgery;  Laterality: Left;  . NECK SURGERY     fusion-   . NISSEN FUNDOPLICATION     x 2  . OLECRANON BURSECTOMY Left 01/11/2014   Procedure: OLECRANON BURSECTOMY;  Surgeon: Carole Civil, MD;  [facility]: [facility];  Service: Orthopedics;  Laterality: Left;  . OOPHORECTOMY Right   . PATCH ANGIOPLASTY Right 12/12/2015   Procedure: PATCH ANGIOPLASTY RIGHT CAROTID ARTERY USING HEMASHIELD PLATINUM FINESSE PATCH;  Surgeon: Mal Misty, MD;  Location: [place];  Service: Vascular;  Laterality: Right;  . TONSILLECTOMY      Current Outpatient Medications  Medication Sig Dispense Refill  . acetaminophen (TYLENOL) 325 MG tablet Take 650 mg by mouth every 6 (six) hours as needed.     . AMINO ACIDS-PROTEIN HYDROLYS PO Take 30 mLs by mouth 2 (two) times daily.    Marland Kitchen amLODipine (NORVASC) 5 MG tablet Take 1 tablet (5 mg total) by mouth daily. 30 tablet 0  . budesonide-formoterol (SYMBICORT) 160-4.5 MCG/ACT inhaler Inhale 2 puffs into the lungs 2 (two) times daily.    . Cholecalciferol (VITAMIN D3 SUPER STRENGTH) 50 MCG (2000 UT) TABS Take 1 tablet by mouth daily.    Marland Kitchen dicyclomine (BENTYL) 20  MG tablet Take 20 mg by mouth 4 (four) times daily.    . DULoxetine (CYMBALTA) 30 MG capsule Take 30 mg by mouth daily.    . ferrous sulfate 325 (65 FE) MG tablet Take 325 mg by mouth 2 (two) times daily with a meal.    . FLUoxetine (PROZAC) 40 MG capsule Take 40 mg by mouth daily.      . folic acid (FOLVITE) 1 MG tablet Take 1 mg by mouth daily.    Marland Kitchen gabapentin (NEURONTIN) 400 MG capsule Take 400 mg by mouth 2 (two) times daily.    Marland Kitchen  HYDROcodone-acetaminophen (NORCO/VICODIN) 5-325 MG tablet Take 2 tablets by mouth every 6 (six) hours as needed for moderate pain. 30 tablet 0  . ipratropium-albuterol (DUONEB) 0.5-2.5 (3) MG/3ML SOLN Take 3 mLs by nebulization every 6 (six) hours as needed (SOB).    Marland Kitchen lisinopril (PRINIVIL,ZESTRIL) 20 MG tablet Take 20 mg by mouth daily.    . Melatonin 3 MG TABS Take 6 mg by mouth at bedtime.    . metoprolol tartrate (LOPRESSOR) 25 MG tablet Take 1 tablet (25 mg total) by mouth 2 (two) times daily. 60 tablet 0  . mirtazapine (REMERON) 15 MG tablet Take 15 mg by mouth at bedtime.    . montelukast (SINGULAIR) 10 MG tablet Take 10 mg by mouth daily.     Marland Kitchen omeprazole (PRILOSEC) 20 MG capsule Take 40 mg by mouth daily.     . ondansetron (ZOFRAN-ODT) 4 MG disintegrating tablet Take 4 mg by mouth every 4 (four) hours as needed for nausea or vomiting.    . polycarbophil (FIBERCON) 625 MG tablet Take 625 mg by mouth daily.    . predniSONE (DELTASONE) 10 MG tablet Take 5 mg by mouth daily with breakfast.     . Probiotic Product (ALIGN) 4 MG CAPS Take 1 capsule by mouth daily.    Marland Kitchen thiamine 100 MG tablet Take 100 mg by mouth daily.    Marland Kitchen tiotropium (SPIRIVA) 18 MCG inhalation capsule Place 18 mcg into inhaler and inhale daily.    . vitamin C (ASCORBIC ACID) 500 MG tablet Take 500 mg by mouth 2 (two) times daily.    Marland Kitchen docusate sodium (COLACE) 100 MG capsule Take 1 capsule (100 mg total) by mouth every 12 (twelve) hours. While taking oxycodone (Patient not taking: Reported on  10/18/2018) 60 capsule 0   No current facility-administered medications for this visit.     Allergies as of 10/18/2018 - Review Complete 10/18/2018  Allergen Reaction Noted  . Cardura [doxazosin mesylate] Hives 09/14/2013  . Barbiturates Nausea And Vomiting and Rash 05/04/2009  . Levofloxacin Rash 12/06/2015  . Sulfa antibiotics Nausea Only and Rash 07/07/2011    Family History  Problem Relation Age of Onset  . Heart disease Father   . Colon cancer Neg Hx     Social History   Socioeconomic History  . Marital status: Divorced    Spouse name: Not on file  . Number of children: Not on file  . Years of education: Not on file  . Highest education level: Not on file  Occupational History  . Not on file  Social Needs  . Financial resource strain: Not on file  . Food insecurity:    Worry: Not on file    Inability: Not on file  . Transportation needs:    Medical: Not on file    Non-medical: Not on file  Tobacco Use  . Smoking status: Never Smoker  . Smokeless tobacco: Never Used  Substance and Sexual Activity  . Alcohol use: Not Currently    Alcohol/week: 10.0 standard drinks    Types: 10 Cans of beer per week    Comment: couple of beers daily  . Drug use: No  . Sexual activity: Never    Birth control/protection: Post-menopausal  Lifestyle  . Physical activity:    Days per week: Not on file    Minutes per session: Not on file  . Stress: Not on file  Relationships  . Social connections:    Talks on phone: Not on file    Gets  together: Not on file    Attends religious service: Not on file    Active member of club or organization: Not on file    Attends meetings of clubs or organizations: Not on file    Relationship status: Not on file  Other Topics Concern  . Not on file  Social History Narrative  . Not on file    Review of Systems: Gen: Denies fever, chills, anorexia. Denies fatigue, weakness, weight loss.  CV: Denies chest pain, palpitations, syncope,  peripheral edema, and claudication. Resp: Denies dyspnea at rest, cough, wheezing, coughing up blood, and pleurisy. GI: see HPI Derm: Denies rash, itching, dry skin Psych: Denies depression, anxiety, memory loss, confusion. No homicidal or suicidal ideation.  Heme: Denies bruising, bleeding, and enlarged lymph nodes.  Physical Exam: BP (!) 102/59   Pulse (!) 56   Temp (!) 96.7 F (35.9 C) (Oral)   Ht 5\' 2"  (1.575 m)   Wt 110 lb (49.9 kg) Comment: pt said she was weighed at facility 10/15/18; unable to stand  BMI 20.12 kg/m  General:   Alert and oriented. No distress noted. Pleasant and cooperative.  Head:  Normocephalic and atraumatic. Eyes:  Conjuctiva clear without scleral icterus. Mouth:  Oral mucosa pink and moist.  Abdomen:  +BS, soft, non-tender and non-distended. No rebound or guarding. No HSM or masses noted. Msk:  Symmetrical without gross deformities. Normal posture. Extremities:  Without edema. Neurologic:  Alert and  oriented x4 Psych:  Alert and cooperative. Normal mood and affect.

## 2018-10-18 NOTE — Patient Instructions (Signed)
I have decreased omeprazole (Prilosec) to only 20 milligrams. Ideally, you wouldn't be on this with history of Cdiff; however, you are on prednisone daily, which can affect your stomach. We will keep you at the lowest dosage possible of omeprazole.  We will see you in 6-8 months!  Good luck with your upcoming surgery!  It was a pleasure to see you today. I strive to create trusting relationships with patients to provide genuine, compassionate, and quality care. I value your feedback. If you receive a survey regarding your visit,  I greatly appreciate you taking time to fill this out.   Annitta Needs, PhD, ANP-BC [facility]

## 2018-10-18 NOTE — Assessment & Plan Note (Signed)
Hospitalized in Nov 2197 with complicated Cdiff with megacolon. She has history of multiple prior episodes in 2007 and twice in 2014. Symptomatically doing well today. I do note that she is on omeprazole 40 mg daily. Ideally, PPI would be avoided in this scenario; however, she is on low dose prednisone daily and at risk for NSAID-induced injury as this is a chronic med. Discussed with her risks and benefits. Will decrease omeprazole to 20 milligrams daily. If she were to ever come off prednisone, would remove PPI altogether. 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 Will see her again in 6-8 months.

## 2018-10-19 NOTE — Progress Notes (Signed)
CC'D TO PCP °

## 2018-10-20 DIAGNOSIS — F4321 Adjustment disorder with depressed mood: Secondary | ICD-10-CM | POA: Diagnosis not present

## 2018-10-20 DIAGNOSIS — F320 Major depressive disorder, single episode, mild: Secondary | ICD-10-CM | POA: Diagnosis not present

## 2018-10-27 DIAGNOSIS — Z01818 Encounter for other preprocedural examination: Secondary | ICD-10-CM | POA: Diagnosis not present

## 2018-10-27 DIAGNOSIS — M1711 Unilateral primary osteoarthritis, right knee: Secondary | ICD-10-CM | POA: Diagnosis not present

## 2018-10-27 DIAGNOSIS — M85861 Other specified disorders of bone density and structure, right lower leg: Secondary | ICD-10-CM | POA: Diagnosis not present

## 2018-10-27 DIAGNOSIS — I4510 Unspecified right bundle-branch block: Secondary | ICD-10-CM | POA: Diagnosis not present

## 2018-10-27 DIAGNOSIS — Z01810 Encounter for preprocedural cardiovascular examination: Secondary | ICD-10-CM | POA: Diagnosis not present

## 2018-10-27 DIAGNOSIS — M25461 Effusion, right knee: Secondary | ICD-10-CM | POA: Diagnosis not present

## 2018-10-28 DIAGNOSIS — I4510 Unspecified right bundle-branch block: Secondary | ICD-10-CM | POA: Diagnosis not present

## 2018-11-03 DIAGNOSIS — F320 Major depressive disorder, single episode, mild: Secondary | ICD-10-CM | POA: Diagnosis not present

## 2018-11-03 DIAGNOSIS — F4321 Adjustment disorder with depressed mood: Secondary | ICD-10-CM | POA: Diagnosis not present

## 2018-11-08 DIAGNOSIS — J441 Chronic obstructive pulmonary disease with (acute) exacerbation: Secondary | ICD-10-CM | POA: Diagnosis not present

## 2018-11-10 DIAGNOSIS — F320 Major depressive disorder, single episode, mild: Secondary | ICD-10-CM | POA: Diagnosis not present

## 2018-11-10 DIAGNOSIS — F4321 Adjustment disorder with depressed mood: Secondary | ICD-10-CM | POA: Diagnosis not present

## 2018-11-15 DIAGNOSIS — M1990 Unspecified osteoarthritis, unspecified site: Secondary | ICD-10-CM | POA: Diagnosis not present

## 2018-11-15 DIAGNOSIS — I129 Hypertensive chronic kidney disease with stage 1 through stage 4 chronic kidney disease, or unspecified chronic kidney disease: Secondary | ICD-10-CM | POA: Diagnosis not present

## 2018-11-15 DIAGNOSIS — J441 Chronic obstructive pulmonary disease with (acute) exacerbation: Secondary | ICD-10-CM | POA: Diagnosis not present

## 2018-11-15 DIAGNOSIS — N183 Chronic kidney disease, stage 3 (moderate): Secondary | ICD-10-CM | POA: Diagnosis not present

## 2018-11-16 DIAGNOSIS — R0689 Other abnormalities of breathing: Secondary | ICD-10-CM | POA: Diagnosis not present

## 2018-11-16 DIAGNOSIS — I10 Essential (primary) hypertension: Secondary | ICD-10-CM | POA: Diagnosis not present

## 2018-11-16 DIAGNOSIS — R262 Difficulty in walking, not elsewhere classified: Secondary | ICD-10-CM | POA: Diagnosis not present

## 2018-11-16 DIAGNOSIS — M179 Osteoarthritis of knee, unspecified: Secondary | ICD-10-CM | POA: Diagnosis not present

## 2018-11-16 DIAGNOSIS — M1711 Unilateral primary osteoarthritis, right knee: Secondary | ICD-10-CM | POA: Diagnosis not present

## 2018-11-16 DIAGNOSIS — R269 Unspecified abnormalities of gait and mobility: Secondary | ICD-10-CM | POA: Diagnosis not present

## 2018-11-16 DIAGNOSIS — Z5189 Encounter for other specified aftercare: Secondary | ICD-10-CM | POA: Diagnosis not present

## 2018-11-16 DIAGNOSIS — N319 Neuromuscular dysfunction of bladder, unspecified: Secondary | ICD-10-CM | POA: Diagnosis not present

## 2018-11-16 DIAGNOSIS — G8929 Other chronic pain: Secondary | ICD-10-CM | POA: Diagnosis not present

## 2018-11-16 DIAGNOSIS — J449 Chronic obstructive pulmonary disease, unspecified: Secondary | ICD-10-CM | POA: Diagnosis not present

## 2018-11-16 DIAGNOSIS — R509 Fever, unspecified: Secondary | ICD-10-CM | POA: Diagnosis not present

## 2018-11-16 DIAGNOSIS — R52 Pain, unspecified: Secondary | ICD-10-CM | POA: Diagnosis not present

## 2018-11-16 DIAGNOSIS — I2510 Atherosclerotic heart disease of native coronary artery without angina pectoris: Secondary | ICD-10-CM | POA: Diagnosis not present

## 2018-11-16 DIAGNOSIS — S14109S Unspecified injury at unspecified level of cervical spinal cord, sequela: Secondary | ICD-10-CM | POA: Diagnosis not present

## 2018-11-16 DIAGNOSIS — J45909 Unspecified asthma, uncomplicated: Secondary | ICD-10-CM | POA: Diagnosis not present

## 2018-11-16 DIAGNOSIS — S12000S Unspecified displaced fracture of first cervical vertebra, sequela: Secondary | ICD-10-CM | POA: Diagnosis not present

## 2018-11-16 DIAGNOSIS — K560 Paralytic ileus: Secondary | ICD-10-CM | POA: Diagnosis not present

## 2018-11-16 DIAGNOSIS — Z8673 Personal history of transient ischemic attack (TIA), and cerebral infarction without residual deficits: Secondary | ICD-10-CM | POA: Diagnosis not present

## 2018-11-16 DIAGNOSIS — N189 Chronic kidney disease, unspecified: Secondary | ICD-10-CM | POA: Diagnosis not present

## 2018-11-16 DIAGNOSIS — A0472 Enterocolitis due to Clostridium difficile, not specified as recurrent: Secondary | ICD-10-CM | POA: Diagnosis not present

## 2018-11-16 DIAGNOSIS — E876 Hypokalemia: Secondary | ICD-10-CM | POA: Diagnosis not present

## 2018-11-16 DIAGNOSIS — R2681 Unsteadiness on feet: Secondary | ICD-10-CM | POA: Diagnosis not present

## 2018-11-16 DIAGNOSIS — I129 Hypertensive chronic kidney disease with stage 1 through stage 4 chronic kidney disease, or unspecified chronic kidney disease: Secondary | ICD-10-CM | POA: Diagnosis not present

## 2018-11-16 DIAGNOSIS — K219 Gastro-esophageal reflux disease without esophagitis: Secondary | ICD-10-CM | POA: Diagnosis not present

## 2018-11-16 DIAGNOSIS — Z7952 Long term (current) use of systemic steroids: Secondary | ICD-10-CM | POA: Diagnosis not present

## 2018-11-16 DIAGNOSIS — Z743 Need for continuous supervision: Secondary | ICD-10-CM | POA: Diagnosis not present

## 2018-11-16 DIAGNOSIS — Z7409 Other reduced mobility: Secondary | ICD-10-CM | POA: Diagnosis not present

## 2018-11-16 DIAGNOSIS — R5381 Other malaise: Secondary | ICD-10-CM | POA: Diagnosis not present

## 2018-11-16 DIAGNOSIS — R279 Unspecified lack of coordination: Secondary | ICD-10-CM | POA: Diagnosis not present

## 2018-11-16 DIAGNOSIS — R1312 Dysphagia, oropharyngeal phase: Secondary | ICD-10-CM | POA: Diagnosis not present

## 2018-11-16 DIAGNOSIS — K589 Irritable bowel syndrome without diarrhea: Secondary | ICD-10-CM | POA: Diagnosis not present

## 2018-11-16 DIAGNOSIS — K592 Neurogenic bowel, not elsewhere classified: Secondary | ICD-10-CM | POA: Diagnosis not present

## 2018-11-16 DIAGNOSIS — N139 Obstructive and reflux uropathy, unspecified: Secondary | ICD-10-CM | POA: Diagnosis not present

## 2018-11-16 DIAGNOSIS — S12100S Unspecified displaced fracture of second cervical vertebra, sequela: Secondary | ICD-10-CM | POA: Diagnosis not present

## 2018-11-16 DIAGNOSIS — N184 Chronic kidney disease, stage 4 (severe): Secondary | ICD-10-CM | POA: Diagnosis not present

## 2018-11-16 DIAGNOSIS — Z96651 Presence of right artificial knee joint: Secondary | ICD-10-CM | POA: Diagnosis not present

## 2018-11-16 DIAGNOSIS — E43 Unspecified severe protein-calorie malnutrition: Secondary | ICD-10-CM | POA: Diagnosis not present

## 2018-11-16 DIAGNOSIS — Z841 Family history of disorders of kidney and ureter: Secondary | ICD-10-CM | POA: Diagnosis not present

## 2018-11-16 DIAGNOSIS — K5939 Other megacolon: Secondary | ICD-10-CM | POA: Diagnosis not present

## 2018-11-16 DIAGNOSIS — D72829 Elevated white blood cell count, unspecified: Secondary | ICD-10-CM | POA: Diagnosis not present

## 2018-11-16 DIAGNOSIS — D631 Anemia in chronic kidney disease: Secondary | ICD-10-CM | POA: Diagnosis not present

## 2018-11-16 DIAGNOSIS — Z981 Arthrodesis status: Secondary | ICD-10-CM | POA: Diagnosis not present

## 2018-11-16 DIAGNOSIS — N183 Chronic kidney disease, stage 3 (moderate): Secondary | ICD-10-CM | POA: Diagnosis not present

## 2018-11-16 DIAGNOSIS — G8918 Other acute postprocedural pain: Secondary | ICD-10-CM | POA: Diagnosis not present

## 2018-11-16 DIAGNOSIS — G629 Polyneuropathy, unspecified: Secondary | ICD-10-CM | POA: Diagnosis not present

## 2018-11-16 DIAGNOSIS — R296 Repeated falls: Secondary | ICD-10-CM | POA: Diagnosis not present

## 2018-11-17 DIAGNOSIS — N1832 Chronic kidney disease, stage 3b: Secondary | ICD-10-CM | POA: Insufficient documentation

## 2018-11-17 DIAGNOSIS — N183 Chronic kidney disease, stage 3 unspecified: Secondary | ICD-10-CM | POA: Insufficient documentation

## 2018-11-17 DIAGNOSIS — K589 Irritable bowel syndrome without diarrhea: Secondary | ICD-10-CM | POA: Insufficient documentation

## 2018-11-18 DIAGNOSIS — K219 Gastro-esophageal reflux disease without esophagitis: Secondary | ICD-10-CM | POA: Insufficient documentation

## 2018-11-20 DIAGNOSIS — J45909 Unspecified asthma, uncomplicated: Secondary | ICD-10-CM | POA: Diagnosis not present

## 2018-11-20 DIAGNOSIS — Z96651 Presence of right artificial knee joint: Secondary | ICD-10-CM | POA: Diagnosis not present

## 2018-11-20 DIAGNOSIS — Z471 Aftercare following joint replacement surgery: Secondary | ICD-10-CM | POA: Diagnosis not present

## 2018-11-20 DIAGNOSIS — F329 Major depressive disorder, single episode, unspecified: Secondary | ICD-10-CM | POA: Diagnosis not present

## 2018-11-20 DIAGNOSIS — N183 Chronic kidney disease, stage 3 (moderate): Secondary | ICD-10-CM | POA: Diagnosis not present

## 2018-11-20 DIAGNOSIS — N319 Neuromuscular dysfunction of bladder, unspecified: Secondary | ICD-10-CM | POA: Diagnosis not present

## 2018-11-20 DIAGNOSIS — S41112A Laceration without foreign body of left upper arm, initial encounter: Secondary | ICD-10-CM | POA: Diagnosis not present

## 2018-11-20 DIAGNOSIS — E876 Hypokalemia: Secondary | ICD-10-CM | POA: Diagnosis not present

## 2018-11-20 DIAGNOSIS — J449 Chronic obstructive pulmonary disease, unspecified: Secondary | ICD-10-CM | POA: Diagnosis not present

## 2018-11-20 DIAGNOSIS — D631 Anemia in chronic kidney disease: Secondary | ICD-10-CM | POA: Diagnosis not present

## 2018-11-20 DIAGNOSIS — N189 Chronic kidney disease, unspecified: Secondary | ICD-10-CM | POA: Diagnosis not present

## 2018-11-20 DIAGNOSIS — R279 Unspecified lack of coordination: Secondary | ICD-10-CM | POA: Diagnosis not present

## 2018-11-20 DIAGNOSIS — R296 Repeated falls: Secondary | ICD-10-CM | POA: Diagnosis not present

## 2018-11-20 DIAGNOSIS — R2681 Unsteadiness on feet: Secondary | ICD-10-CM | POA: Diagnosis not present

## 2018-11-20 DIAGNOSIS — S12100S Unspecified displaced fracture of second cervical vertebra, sequela: Secondary | ICD-10-CM | POA: Diagnosis not present

## 2018-11-20 DIAGNOSIS — S12000S Unspecified displaced fracture of first cervical vertebra, sequela: Secondary | ICD-10-CM | POA: Diagnosis not present

## 2018-11-20 DIAGNOSIS — R269 Unspecified abnormalities of gait and mobility: Secondary | ICD-10-CM | POA: Diagnosis not present

## 2018-11-20 DIAGNOSIS — S14109S Unspecified injury at unspecified level of cervical spinal cord, sequela: Secondary | ICD-10-CM | POA: Diagnosis not present

## 2018-11-20 DIAGNOSIS — M1711 Unilateral primary osteoarthritis, right knee: Secondary | ICD-10-CM | POA: Diagnosis not present

## 2018-11-20 DIAGNOSIS — E559 Vitamin D deficiency, unspecified: Secondary | ICD-10-CM | POA: Diagnosis not present

## 2018-11-20 DIAGNOSIS — I10 Essential (primary) hypertension: Secondary | ICD-10-CM | POA: Diagnosis not present

## 2018-11-20 DIAGNOSIS — N184 Chronic kidney disease, stage 4 (severe): Secondary | ICD-10-CM | POA: Diagnosis not present

## 2018-11-20 DIAGNOSIS — R0689 Other abnormalities of breathing: Secondary | ICD-10-CM | POA: Diagnosis not present

## 2018-11-20 DIAGNOSIS — A0472 Enterocolitis due to Clostridium difficile, not specified as recurrent: Secondary | ICD-10-CM | POA: Diagnosis not present

## 2018-11-20 DIAGNOSIS — N139 Obstructive and reflux uropathy, unspecified: Secondary | ICD-10-CM | POA: Diagnosis not present

## 2018-11-20 DIAGNOSIS — J441 Chronic obstructive pulmonary disease with (acute) exacerbation: Secondary | ICD-10-CM | POA: Diagnosis not present

## 2018-11-20 DIAGNOSIS — Z5189 Encounter for other specified aftercare: Secondary | ICD-10-CM | POA: Diagnosis not present

## 2018-11-20 DIAGNOSIS — K5939 Other megacolon: Secondary | ICD-10-CM | POA: Diagnosis not present

## 2018-11-20 DIAGNOSIS — Z7982 Long term (current) use of aspirin: Secondary | ICD-10-CM | POA: Diagnosis not present

## 2018-11-20 DIAGNOSIS — M179 Osteoarthritis of knee, unspecified: Secondary | ICD-10-CM | POA: Diagnosis not present

## 2018-11-20 DIAGNOSIS — R1312 Dysphagia, oropharyngeal phase: Secondary | ICD-10-CM | POA: Diagnosis not present

## 2018-11-20 DIAGNOSIS — K592 Neurogenic bowel, not elsewhere classified: Secondary | ICD-10-CM | POA: Diagnosis not present

## 2018-11-20 DIAGNOSIS — M25461 Effusion, right knee: Secondary | ICD-10-CM | POA: Diagnosis not present

## 2018-11-20 DIAGNOSIS — E43 Unspecified severe protein-calorie malnutrition: Secondary | ICD-10-CM | POA: Diagnosis not present

## 2018-11-20 DIAGNOSIS — R52 Pain, unspecified: Secondary | ICD-10-CM | POA: Diagnosis not present

## 2018-11-20 DIAGNOSIS — I129 Hypertensive chronic kidney disease with stage 1 through stage 4 chronic kidney disease, or unspecified chronic kidney disease: Secondary | ICD-10-CM | POA: Diagnosis not present

## 2018-11-20 DIAGNOSIS — R262 Difficulty in walking, not elsewhere classified: Secondary | ICD-10-CM | POA: Diagnosis not present

## 2018-11-20 DIAGNOSIS — Z743 Need for continuous supervision: Secondary | ICD-10-CM | POA: Diagnosis not present

## 2018-11-20 DIAGNOSIS — K560 Paralytic ileus: Secondary | ICD-10-CM | POA: Diagnosis not present

## 2018-11-24 DIAGNOSIS — F329 Major depressive disorder, single episode, unspecified: Secondary | ICD-10-CM | POA: Diagnosis not present

## 2018-11-24 DIAGNOSIS — Z96651 Presence of right artificial knee joint: Secondary | ICD-10-CM | POA: Diagnosis not present

## 2018-11-25 DIAGNOSIS — Z96651 Presence of right artificial knee joint: Secondary | ICD-10-CM | POA: Diagnosis not present

## 2018-11-25 DIAGNOSIS — I10 Essential (primary) hypertension: Secondary | ICD-10-CM | POA: Diagnosis not present

## 2018-11-29 DIAGNOSIS — Z96651 Presence of right artificial knee joint: Secondary | ICD-10-CM | POA: Diagnosis not present

## 2018-11-29 DIAGNOSIS — I10 Essential (primary) hypertension: Secondary | ICD-10-CM | POA: Diagnosis not present

## 2018-12-01 DIAGNOSIS — Z7982 Long term (current) use of aspirin: Secondary | ICD-10-CM | POA: Diagnosis not present

## 2018-12-01 DIAGNOSIS — Z96651 Presence of right artificial knee joint: Secondary | ICD-10-CM | POA: Insufficient documentation

## 2018-12-01 DIAGNOSIS — Z471 Aftercare following joint replacement surgery: Secondary | ICD-10-CM | POA: Insufficient documentation

## 2018-12-02 DIAGNOSIS — Z96651 Presence of right artificial knee joint: Secondary | ICD-10-CM | POA: Diagnosis not present

## 2018-12-02 DIAGNOSIS — F329 Major depressive disorder, single episode, unspecified: Secondary | ICD-10-CM | POA: Diagnosis not present

## 2018-12-06 DIAGNOSIS — I10 Essential (primary) hypertension: Secondary | ICD-10-CM | POA: Diagnosis not present

## 2018-12-06 DIAGNOSIS — Z96651 Presence of right artificial knee joint: Secondary | ICD-10-CM | POA: Diagnosis not present

## 2018-12-07 DIAGNOSIS — S41112A Laceration without foreign body of left upper arm, initial encounter: Secondary | ICD-10-CM | POA: Diagnosis not present

## 2018-12-10 DIAGNOSIS — N184 Chronic kidney disease, stage 4 (severe): Secondary | ICD-10-CM | POA: Diagnosis not present

## 2018-12-10 DIAGNOSIS — D631 Anemia in chronic kidney disease: Secondary | ICD-10-CM | POA: Diagnosis not present

## 2018-12-13 DIAGNOSIS — Z96651 Presence of right artificial knee joint: Secondary | ICD-10-CM | POA: Diagnosis not present

## 2018-12-14 DIAGNOSIS — S41112A Laceration without foreign body of left upper arm, initial encounter: Secondary | ICD-10-CM | POA: Diagnosis not present

## 2018-12-23 DIAGNOSIS — E559 Vitamin D deficiency, unspecified: Secondary | ICD-10-CM | POA: Diagnosis not present

## 2018-12-23 DIAGNOSIS — I129 Hypertensive chronic kidney disease with stage 1 through stage 4 chronic kidney disease, or unspecified chronic kidney disease: Secondary | ICD-10-CM | POA: Diagnosis not present

## 2018-12-23 DIAGNOSIS — J441 Chronic obstructive pulmonary disease with (acute) exacerbation: Secondary | ICD-10-CM | POA: Diagnosis not present

## 2018-12-23 DIAGNOSIS — N183 Chronic kidney disease, stage 3 (moderate): Secondary | ICD-10-CM | POA: Diagnosis not present

## 2018-12-27 DIAGNOSIS — J441 Chronic obstructive pulmonary disease with (acute) exacerbation: Secondary | ICD-10-CM | POA: Diagnosis not present

## 2018-12-27 DIAGNOSIS — Z96651 Presence of right artificial knee joint: Secondary | ICD-10-CM | POA: Diagnosis not present

## 2018-12-29 DIAGNOSIS — Z96651 Presence of right artificial knee joint: Secondary | ICD-10-CM | POA: Diagnosis not present

## 2018-12-29 DIAGNOSIS — Z471 Aftercare following joint replacement surgery: Secondary | ICD-10-CM | POA: Diagnosis not present

## 2018-12-29 DIAGNOSIS — M25461 Effusion, right knee: Secondary | ICD-10-CM | POA: Diagnosis not present

## 2019-01-03 DIAGNOSIS — M179 Osteoarthritis of knee, unspecified: Secondary | ICD-10-CM | POA: Diagnosis not present

## 2019-01-03 DIAGNOSIS — Z4789 Encounter for other orthopedic aftercare: Secondary | ICD-10-CM | POA: Diagnosis not present

## 2019-01-03 DIAGNOSIS — M1711 Unilateral primary osteoarthritis, right knee: Secondary | ICD-10-CM | POA: Diagnosis not present

## 2019-01-03 DIAGNOSIS — M6281 Muscle weakness (generalized): Secondary | ICD-10-CM | POA: Diagnosis not present

## 2019-01-03 DIAGNOSIS — Z5189 Encounter for other specified aftercare: Secondary | ICD-10-CM | POA: Diagnosis not present

## 2019-01-04 DIAGNOSIS — M1711 Unilateral primary osteoarthritis, right knee: Secondary | ICD-10-CM | POA: Diagnosis not present

## 2019-01-04 DIAGNOSIS — Z5189 Encounter for other specified aftercare: Secondary | ICD-10-CM | POA: Diagnosis not present

## 2019-01-04 DIAGNOSIS — M179 Osteoarthritis of knee, unspecified: Secondary | ICD-10-CM | POA: Diagnosis not present

## 2019-01-04 DIAGNOSIS — Z4789 Encounter for other orthopedic aftercare: Secondary | ICD-10-CM | POA: Diagnosis not present

## 2019-01-04 DIAGNOSIS — M6281 Muscle weakness (generalized): Secondary | ICD-10-CM | POA: Diagnosis not present

## 2019-01-05 DIAGNOSIS — K148 Other diseases of tongue: Secondary | ICD-10-CM | POA: Diagnosis not present

## 2019-01-05 DIAGNOSIS — Z4789 Encounter for other orthopedic aftercare: Secondary | ICD-10-CM | POA: Diagnosis not present

## 2019-01-05 DIAGNOSIS — M1711 Unilateral primary osteoarthritis, right knee: Secondary | ICD-10-CM | POA: Diagnosis not present

## 2019-01-05 DIAGNOSIS — M6281 Muscle weakness (generalized): Secondary | ICD-10-CM | POA: Diagnosis not present

## 2019-01-05 DIAGNOSIS — M179 Osteoarthritis of knee, unspecified: Secondary | ICD-10-CM | POA: Diagnosis not present

## 2019-01-05 DIAGNOSIS — Z5189 Encounter for other specified aftercare: Secondary | ICD-10-CM | POA: Diagnosis not present

## 2019-01-06 DIAGNOSIS — Z5189 Encounter for other specified aftercare: Secondary | ICD-10-CM | POA: Diagnosis not present

## 2019-01-06 DIAGNOSIS — M179 Osteoarthritis of knee, unspecified: Secondary | ICD-10-CM | POA: Diagnosis not present

## 2019-01-06 DIAGNOSIS — M1711 Unilateral primary osteoarthritis, right knee: Secondary | ICD-10-CM | POA: Diagnosis not present

## 2019-01-06 DIAGNOSIS — M6281 Muscle weakness (generalized): Secondary | ICD-10-CM | POA: Diagnosis not present

## 2019-01-06 DIAGNOSIS — Z4789 Encounter for other orthopedic aftercare: Secondary | ICD-10-CM | POA: Diagnosis not present

## 2019-01-07 DIAGNOSIS — M6281 Muscle weakness (generalized): Secondary | ICD-10-CM | POA: Diagnosis not present

## 2019-01-07 DIAGNOSIS — Z5189 Encounter for other specified aftercare: Secondary | ICD-10-CM | POA: Diagnosis not present

## 2019-01-07 DIAGNOSIS — Z4789 Encounter for other orthopedic aftercare: Secondary | ICD-10-CM | POA: Diagnosis not present

## 2019-01-07 DIAGNOSIS — M179 Osteoarthritis of knee, unspecified: Secondary | ICD-10-CM | POA: Diagnosis not present

## 2019-01-07 DIAGNOSIS — M1711 Unilateral primary osteoarthritis, right knee: Secondary | ICD-10-CM | POA: Diagnosis not present

## 2019-01-08 DIAGNOSIS — Z5189 Encounter for other specified aftercare: Secondary | ICD-10-CM | POA: Diagnosis not present

## 2019-01-08 DIAGNOSIS — M179 Osteoarthritis of knee, unspecified: Secondary | ICD-10-CM | POA: Diagnosis not present

## 2019-01-08 DIAGNOSIS — Z4789 Encounter for other orthopedic aftercare: Secondary | ICD-10-CM | POA: Diagnosis not present

## 2019-01-08 DIAGNOSIS — M1711 Unilateral primary osteoarthritis, right knee: Secondary | ICD-10-CM | POA: Diagnosis not present

## 2019-01-08 DIAGNOSIS — M6281 Muscle weakness (generalized): Secondary | ICD-10-CM | POA: Diagnosis not present

## 2019-01-10 DIAGNOSIS — M6281 Muscle weakness (generalized): Secondary | ICD-10-CM | POA: Diagnosis not present

## 2019-01-10 DIAGNOSIS — Z5189 Encounter for other specified aftercare: Secondary | ICD-10-CM | POA: Diagnosis not present

## 2019-01-10 DIAGNOSIS — Z4789 Encounter for other orthopedic aftercare: Secondary | ICD-10-CM | POA: Diagnosis not present

## 2019-01-10 DIAGNOSIS — M1711 Unilateral primary osteoarthritis, right knee: Secondary | ICD-10-CM | POA: Diagnosis not present

## 2019-01-10 DIAGNOSIS — M179 Osteoarthritis of knee, unspecified: Secondary | ICD-10-CM | POA: Diagnosis not present

## 2019-01-11 DIAGNOSIS — Z4789 Encounter for other orthopedic aftercare: Secondary | ICD-10-CM | POA: Diagnosis not present

## 2019-01-11 DIAGNOSIS — Z5189 Encounter for other specified aftercare: Secondary | ICD-10-CM | POA: Diagnosis not present

## 2019-01-11 DIAGNOSIS — M179 Osteoarthritis of knee, unspecified: Secondary | ICD-10-CM | POA: Diagnosis not present

## 2019-01-11 DIAGNOSIS — M1711 Unilateral primary osteoarthritis, right knee: Secondary | ICD-10-CM | POA: Diagnosis not present

## 2019-01-11 DIAGNOSIS — M6281 Muscle weakness (generalized): Secondary | ICD-10-CM | POA: Diagnosis not present

## 2019-01-12 DIAGNOSIS — M6281 Muscle weakness (generalized): Secondary | ICD-10-CM | POA: Diagnosis not present

## 2019-01-12 DIAGNOSIS — Z5189 Encounter for other specified aftercare: Secondary | ICD-10-CM | POA: Diagnosis not present

## 2019-01-12 DIAGNOSIS — Z4789 Encounter for other orthopedic aftercare: Secondary | ICD-10-CM | POA: Diagnosis not present

## 2019-01-12 DIAGNOSIS — M179 Osteoarthritis of knee, unspecified: Secondary | ICD-10-CM | POA: Diagnosis not present

## 2019-01-12 DIAGNOSIS — M1711 Unilateral primary osteoarthritis, right knee: Secondary | ICD-10-CM | POA: Diagnosis not present

## 2019-01-13 DIAGNOSIS — M179 Osteoarthritis of knee, unspecified: Secondary | ICD-10-CM | POA: Diagnosis not present

## 2019-01-13 DIAGNOSIS — Z5189 Encounter for other specified aftercare: Secondary | ICD-10-CM | POA: Diagnosis not present

## 2019-01-13 DIAGNOSIS — M1711 Unilateral primary osteoarthritis, right knee: Secondary | ICD-10-CM | POA: Diagnosis not present

## 2019-01-13 DIAGNOSIS — Z4789 Encounter for other orthopedic aftercare: Secondary | ICD-10-CM | POA: Diagnosis not present

## 2019-01-13 DIAGNOSIS — M6281 Muscle weakness (generalized): Secondary | ICD-10-CM | POA: Diagnosis not present

## 2019-01-14 DIAGNOSIS — M1711 Unilateral primary osteoarthritis, right knee: Secondary | ICD-10-CM | POA: Diagnosis not present

## 2019-01-14 DIAGNOSIS — G478 Other sleep disorders: Secondary | ICD-10-CM | POA: Diagnosis not present

## 2019-01-14 DIAGNOSIS — M6281 Muscle weakness (generalized): Secondary | ICD-10-CM | POA: Diagnosis not present

## 2019-01-14 DIAGNOSIS — Z5189 Encounter for other specified aftercare: Secondary | ICD-10-CM | POA: Diagnosis not present

## 2019-01-14 DIAGNOSIS — Z4789 Encounter for other orthopedic aftercare: Secondary | ICD-10-CM | POA: Diagnosis not present

## 2019-01-14 DIAGNOSIS — M179 Osteoarthritis of knee, unspecified: Secondary | ICD-10-CM | POA: Diagnosis not present

## 2019-01-17 DIAGNOSIS — Z4789 Encounter for other orthopedic aftercare: Secondary | ICD-10-CM | POA: Diagnosis not present

## 2019-01-17 DIAGNOSIS — Z5189 Encounter for other specified aftercare: Secondary | ICD-10-CM | POA: Diagnosis not present

## 2019-01-17 DIAGNOSIS — M179 Osteoarthritis of knee, unspecified: Secondary | ICD-10-CM | POA: Diagnosis not present

## 2019-01-17 DIAGNOSIS — M1711 Unilateral primary osteoarthritis, right knee: Secondary | ICD-10-CM | POA: Diagnosis not present

## 2019-01-17 DIAGNOSIS — Z96651 Presence of right artificial knee joint: Secondary | ICD-10-CM | POA: Diagnosis not present

## 2019-01-17 DIAGNOSIS — M6281 Muscle weakness (generalized): Secondary | ICD-10-CM | POA: Diagnosis not present

## 2019-01-18 DIAGNOSIS — M179 Osteoarthritis of knee, unspecified: Secondary | ICD-10-CM | POA: Diagnosis not present

## 2019-01-18 DIAGNOSIS — Z5189 Encounter for other specified aftercare: Secondary | ICD-10-CM | POA: Diagnosis not present

## 2019-01-18 DIAGNOSIS — M1711 Unilateral primary osteoarthritis, right knee: Secondary | ICD-10-CM | POA: Diagnosis not present

## 2019-01-18 DIAGNOSIS — Z4789 Encounter for other orthopedic aftercare: Secondary | ICD-10-CM | POA: Diagnosis not present

## 2019-01-18 DIAGNOSIS — M6281 Muscle weakness (generalized): Secondary | ICD-10-CM | POA: Diagnosis not present

## 2019-01-19 DIAGNOSIS — M179 Osteoarthritis of knee, unspecified: Secondary | ICD-10-CM | POA: Diagnosis not present

## 2019-01-19 DIAGNOSIS — M6281 Muscle weakness (generalized): Secondary | ICD-10-CM | POA: Diagnosis not present

## 2019-01-19 DIAGNOSIS — Z4789 Encounter for other orthopedic aftercare: Secondary | ICD-10-CM | POA: Diagnosis not present

## 2019-01-19 DIAGNOSIS — Z5189 Encounter for other specified aftercare: Secondary | ICD-10-CM | POA: Diagnosis not present

## 2019-01-19 DIAGNOSIS — R52 Pain, unspecified: Secondary | ICD-10-CM | POA: Diagnosis not present

## 2019-01-19 DIAGNOSIS — M1711 Unilateral primary osteoarthritis, right knee: Secondary | ICD-10-CM | POA: Diagnosis not present

## 2019-01-19 DIAGNOSIS — K148 Other diseases of tongue: Secondary | ICD-10-CM | POA: Diagnosis not present

## 2019-01-19 DIAGNOSIS — I10 Essential (primary) hypertension: Secondary | ICD-10-CM | POA: Diagnosis not present

## 2019-01-20 DIAGNOSIS — M6281 Muscle weakness (generalized): Secondary | ICD-10-CM | POA: Diagnosis not present

## 2019-01-20 DIAGNOSIS — M179 Osteoarthritis of knee, unspecified: Secondary | ICD-10-CM | POA: Diagnosis not present

## 2019-01-20 DIAGNOSIS — M1711 Unilateral primary osteoarthritis, right knee: Secondary | ICD-10-CM | POA: Diagnosis not present

## 2019-01-20 DIAGNOSIS — Z4789 Encounter for other orthopedic aftercare: Secondary | ICD-10-CM | POA: Diagnosis not present

## 2019-01-20 DIAGNOSIS — Z5189 Encounter for other specified aftercare: Secondary | ICD-10-CM | POA: Diagnosis not present

## 2019-01-21 DIAGNOSIS — M1711 Unilateral primary osteoarthritis, right knee: Secondary | ICD-10-CM | POA: Diagnosis not present

## 2019-01-21 DIAGNOSIS — M6281 Muscle weakness (generalized): Secondary | ICD-10-CM | POA: Diagnosis not present

## 2019-01-21 DIAGNOSIS — Z5189 Encounter for other specified aftercare: Secondary | ICD-10-CM | POA: Diagnosis not present

## 2019-01-21 DIAGNOSIS — M179 Osteoarthritis of knee, unspecified: Secondary | ICD-10-CM | POA: Diagnosis not present

## 2019-01-21 DIAGNOSIS — Z4789 Encounter for other orthopedic aftercare: Secondary | ICD-10-CM | POA: Diagnosis not present

## 2019-01-24 DIAGNOSIS — M179 Osteoarthritis of knee, unspecified: Secondary | ICD-10-CM | POA: Diagnosis not present

## 2019-01-24 DIAGNOSIS — D509 Iron deficiency anemia, unspecified: Secondary | ICD-10-CM | POA: Diagnosis not present

## 2019-01-24 DIAGNOSIS — M6281 Muscle weakness (generalized): Secondary | ICD-10-CM | POA: Diagnosis not present

## 2019-01-24 DIAGNOSIS — D519 Vitamin B12 deficiency anemia, unspecified: Secondary | ICD-10-CM | POA: Diagnosis not present

## 2019-01-24 DIAGNOSIS — M1711 Unilateral primary osteoarthritis, right knee: Secondary | ICD-10-CM | POA: Diagnosis not present

## 2019-01-24 DIAGNOSIS — N183 Chronic kidney disease, stage 3 (moderate): Secondary | ICD-10-CM | POA: Diagnosis not present

## 2019-01-24 DIAGNOSIS — Z5189 Encounter for other specified aftercare: Secondary | ICD-10-CM | POA: Diagnosis not present

## 2019-01-24 DIAGNOSIS — D649 Anemia, unspecified: Secondary | ICD-10-CM | POA: Diagnosis not present

## 2019-01-24 DIAGNOSIS — L03031 Cellulitis of right toe: Secondary | ICD-10-CM | POA: Diagnosis not present

## 2019-01-24 DIAGNOSIS — N184 Chronic kidney disease, stage 4 (severe): Secondary | ICD-10-CM | POA: Diagnosis not present

## 2019-01-24 DIAGNOSIS — Z4789 Encounter for other orthopedic aftercare: Secondary | ICD-10-CM | POA: Diagnosis not present

## 2019-01-24 DIAGNOSIS — Z79899 Other long term (current) drug therapy: Secondary | ICD-10-CM | POA: Diagnosis not present

## 2019-01-24 DIAGNOSIS — E559 Vitamin D deficiency, unspecified: Secondary | ICD-10-CM | POA: Diagnosis not present

## 2019-01-25 DIAGNOSIS — M1711 Unilateral primary osteoarthritis, right knee: Secondary | ICD-10-CM | POA: Diagnosis not present

## 2019-01-25 DIAGNOSIS — Z4789 Encounter for other orthopedic aftercare: Secondary | ICD-10-CM | POA: Diagnosis not present

## 2019-01-25 DIAGNOSIS — M179 Osteoarthritis of knee, unspecified: Secondary | ICD-10-CM | POA: Diagnosis not present

## 2019-01-25 DIAGNOSIS — Z5189 Encounter for other specified aftercare: Secondary | ICD-10-CM | POA: Diagnosis not present

## 2019-01-25 DIAGNOSIS — M6281 Muscle weakness (generalized): Secondary | ICD-10-CM | POA: Diagnosis not present

## 2019-01-26 DIAGNOSIS — Z4789 Encounter for other orthopedic aftercare: Secondary | ICD-10-CM | POA: Diagnosis not present

## 2019-01-26 DIAGNOSIS — M179 Osteoarthritis of knee, unspecified: Secondary | ICD-10-CM | POA: Diagnosis not present

## 2019-01-26 DIAGNOSIS — Z5189 Encounter for other specified aftercare: Secondary | ICD-10-CM | POA: Diagnosis not present

## 2019-01-26 DIAGNOSIS — M1711 Unilateral primary osteoarthritis, right knee: Secondary | ICD-10-CM | POA: Diagnosis not present

## 2019-01-26 DIAGNOSIS — M6281 Muscle weakness (generalized): Secondary | ICD-10-CM | POA: Diagnosis not present

## 2019-01-27 DIAGNOSIS — Z4789 Encounter for other orthopedic aftercare: Secondary | ICD-10-CM | POA: Diagnosis not present

## 2019-01-27 DIAGNOSIS — M1711 Unilateral primary osteoarthritis, right knee: Secondary | ICD-10-CM | POA: Diagnosis not present

## 2019-01-27 DIAGNOSIS — Z5189 Encounter for other specified aftercare: Secondary | ICD-10-CM | POA: Diagnosis not present

## 2019-01-27 DIAGNOSIS — M179 Osteoarthritis of knee, unspecified: Secondary | ICD-10-CM | POA: Diagnosis not present

## 2019-01-27 DIAGNOSIS — M6281 Muscle weakness (generalized): Secondary | ICD-10-CM | POA: Diagnosis not present

## 2019-01-28 DIAGNOSIS — Z4789 Encounter for other orthopedic aftercare: Secondary | ICD-10-CM | POA: Diagnosis not present

## 2019-01-28 DIAGNOSIS — M6281 Muscle weakness (generalized): Secondary | ICD-10-CM | POA: Diagnosis not present

## 2019-01-28 DIAGNOSIS — M1711 Unilateral primary osteoarthritis, right knee: Secondary | ICD-10-CM | POA: Diagnosis not present

## 2019-01-28 DIAGNOSIS — M179 Osteoarthritis of knee, unspecified: Secondary | ICD-10-CM | POA: Diagnosis not present

## 2019-01-28 DIAGNOSIS — Z5189 Encounter for other specified aftercare: Secondary | ICD-10-CM | POA: Diagnosis not present

## 2019-01-31 DIAGNOSIS — Z5189 Encounter for other specified aftercare: Secondary | ICD-10-CM | POA: Diagnosis not present

## 2019-01-31 DIAGNOSIS — M1711 Unilateral primary osteoarthritis, right knee: Secondary | ICD-10-CM | POA: Diagnosis not present

## 2019-01-31 DIAGNOSIS — Z4789 Encounter for other orthopedic aftercare: Secondary | ICD-10-CM | POA: Diagnosis not present

## 2019-01-31 DIAGNOSIS — M179 Osteoarthritis of knee, unspecified: Secondary | ICD-10-CM | POA: Diagnosis not present

## 2019-01-31 DIAGNOSIS — M6281 Muscle weakness (generalized): Secondary | ICD-10-CM | POA: Diagnosis not present

## 2019-02-01 DIAGNOSIS — M179 Osteoarthritis of knee, unspecified: Secondary | ICD-10-CM | POA: Diagnosis not present

## 2019-02-01 DIAGNOSIS — Z4789 Encounter for other orthopedic aftercare: Secondary | ICD-10-CM | POA: Diagnosis not present

## 2019-02-01 DIAGNOSIS — M1711 Unilateral primary osteoarthritis, right knee: Secondary | ICD-10-CM | POA: Diagnosis not present

## 2019-02-01 DIAGNOSIS — Z5189 Encounter for other specified aftercare: Secondary | ICD-10-CM | POA: Diagnosis not present

## 2019-02-01 DIAGNOSIS — M6281 Muscle weakness (generalized): Secondary | ICD-10-CM | POA: Diagnosis not present

## 2019-02-02 DIAGNOSIS — M1711 Unilateral primary osteoarthritis, right knee: Secondary | ICD-10-CM | POA: Diagnosis not present

## 2019-02-02 DIAGNOSIS — M6281 Muscle weakness (generalized): Secondary | ICD-10-CM | POA: Diagnosis not present

## 2019-02-02 DIAGNOSIS — M179 Osteoarthritis of knee, unspecified: Secondary | ICD-10-CM | POA: Diagnosis not present

## 2019-02-02 DIAGNOSIS — Z5189 Encounter for other specified aftercare: Secondary | ICD-10-CM | POA: Diagnosis not present

## 2019-02-02 DIAGNOSIS — Z4789 Encounter for other orthopedic aftercare: Secondary | ICD-10-CM | POA: Diagnosis not present

## 2019-02-03 DIAGNOSIS — Z5189 Encounter for other specified aftercare: Secondary | ICD-10-CM | POA: Diagnosis not present

## 2019-02-03 DIAGNOSIS — Z4789 Encounter for other orthopedic aftercare: Secondary | ICD-10-CM | POA: Diagnosis not present

## 2019-02-03 DIAGNOSIS — M6281 Muscle weakness (generalized): Secondary | ICD-10-CM | POA: Diagnosis not present

## 2019-02-03 DIAGNOSIS — L03031 Cellulitis of right toe: Secondary | ICD-10-CM | POA: Diagnosis not present

## 2019-02-03 DIAGNOSIS — N183 Chronic kidney disease, stage 3 (moderate): Secondary | ICD-10-CM | POA: Diagnosis not present

## 2019-02-03 DIAGNOSIS — J449 Chronic obstructive pulmonary disease, unspecified: Secondary | ICD-10-CM | POA: Diagnosis not present

## 2019-02-03 DIAGNOSIS — M179 Osteoarthritis of knee, unspecified: Secondary | ICD-10-CM | POA: Diagnosis not present

## 2019-02-03 DIAGNOSIS — I129 Hypertensive chronic kidney disease with stage 1 through stage 4 chronic kidney disease, or unspecified chronic kidney disease: Secondary | ICD-10-CM | POA: Diagnosis not present

## 2019-02-03 DIAGNOSIS — M1711 Unilateral primary osteoarthritis, right knee: Secondary | ICD-10-CM | POA: Diagnosis not present

## 2019-02-04 DIAGNOSIS — Z5189 Encounter for other specified aftercare: Secondary | ICD-10-CM | POA: Diagnosis not present

## 2019-02-04 DIAGNOSIS — Z4789 Encounter for other orthopedic aftercare: Secondary | ICD-10-CM | POA: Diagnosis not present

## 2019-02-04 DIAGNOSIS — M6281 Muscle weakness (generalized): Secondary | ICD-10-CM | POA: Diagnosis not present

## 2019-02-04 DIAGNOSIS — M1711 Unilateral primary osteoarthritis, right knee: Secondary | ICD-10-CM | POA: Diagnosis not present

## 2019-02-04 DIAGNOSIS — M179 Osteoarthritis of knee, unspecified: Secondary | ICD-10-CM | POA: Diagnosis not present

## 2019-02-07 DIAGNOSIS — Z4789 Encounter for other orthopedic aftercare: Secondary | ICD-10-CM | POA: Diagnosis not present

## 2019-02-07 DIAGNOSIS — M6281 Muscle weakness (generalized): Secondary | ICD-10-CM | POA: Diagnosis not present

## 2019-02-07 DIAGNOSIS — M179 Osteoarthritis of knee, unspecified: Secondary | ICD-10-CM | POA: Diagnosis not present

## 2019-02-07 DIAGNOSIS — M1711 Unilateral primary osteoarthritis, right knee: Secondary | ICD-10-CM | POA: Diagnosis not present

## 2019-02-07 DIAGNOSIS — Z5189 Encounter for other specified aftercare: Secondary | ICD-10-CM | POA: Diagnosis not present

## 2019-02-08 DIAGNOSIS — Z4789 Encounter for other orthopedic aftercare: Secondary | ICD-10-CM | POA: Diagnosis not present

## 2019-02-08 DIAGNOSIS — M179 Osteoarthritis of knee, unspecified: Secondary | ICD-10-CM | POA: Diagnosis not present

## 2019-02-08 DIAGNOSIS — M1711 Unilateral primary osteoarthritis, right knee: Secondary | ICD-10-CM | POA: Diagnosis not present

## 2019-02-08 DIAGNOSIS — Z5189 Encounter for other specified aftercare: Secondary | ICD-10-CM | POA: Diagnosis not present

## 2019-02-08 DIAGNOSIS — M6281 Muscle weakness (generalized): Secondary | ICD-10-CM | POA: Diagnosis not present

## 2019-02-09 DIAGNOSIS — M6281 Muscle weakness (generalized): Secondary | ICD-10-CM | POA: Diagnosis not present

## 2019-02-09 DIAGNOSIS — Z5189 Encounter for other specified aftercare: Secondary | ICD-10-CM | POA: Diagnosis not present

## 2019-02-09 DIAGNOSIS — M1711 Unilateral primary osteoarthritis, right knee: Secondary | ICD-10-CM | POA: Diagnosis not present

## 2019-02-09 DIAGNOSIS — Z4789 Encounter for other orthopedic aftercare: Secondary | ICD-10-CM | POA: Diagnosis not present

## 2019-02-09 DIAGNOSIS — M179 Osteoarthritis of knee, unspecified: Secondary | ICD-10-CM | POA: Diagnosis not present

## 2019-02-10 DIAGNOSIS — M6281 Muscle weakness (generalized): Secondary | ICD-10-CM | POA: Diagnosis not present

## 2019-02-10 DIAGNOSIS — M179 Osteoarthritis of knee, unspecified: Secondary | ICD-10-CM | POA: Diagnosis not present

## 2019-02-10 DIAGNOSIS — M1711 Unilateral primary osteoarthritis, right knee: Secondary | ICD-10-CM | POA: Diagnosis not present

## 2019-02-10 DIAGNOSIS — Z4789 Encounter for other orthopedic aftercare: Secondary | ICD-10-CM | POA: Diagnosis not present

## 2019-02-10 DIAGNOSIS — Z5189 Encounter for other specified aftercare: Secondary | ICD-10-CM | POA: Diagnosis not present

## 2019-02-11 DIAGNOSIS — M6281 Muscle weakness (generalized): Secondary | ICD-10-CM | POA: Diagnosis not present

## 2019-02-11 DIAGNOSIS — Z5189 Encounter for other specified aftercare: Secondary | ICD-10-CM | POA: Diagnosis not present

## 2019-02-11 DIAGNOSIS — M179 Osteoarthritis of knee, unspecified: Secondary | ICD-10-CM | POA: Diagnosis not present

## 2019-02-11 DIAGNOSIS — M1711 Unilateral primary osteoarthritis, right knee: Secondary | ICD-10-CM | POA: Diagnosis not present

## 2019-02-11 DIAGNOSIS — Z4789 Encounter for other orthopedic aftercare: Secondary | ICD-10-CM | POA: Diagnosis not present

## 2019-02-14 DIAGNOSIS — M179 Osteoarthritis of knee, unspecified: Secondary | ICD-10-CM | POA: Diagnosis not present

## 2019-02-14 DIAGNOSIS — M6281 Muscle weakness (generalized): Secondary | ICD-10-CM | POA: Diagnosis not present

## 2019-02-14 DIAGNOSIS — I129 Hypertensive chronic kidney disease with stage 1 through stage 4 chronic kidney disease, or unspecified chronic kidney disease: Secondary | ICD-10-CM | POA: Diagnosis not present

## 2019-02-14 DIAGNOSIS — Z5189 Encounter for other specified aftercare: Secondary | ICD-10-CM | POA: Diagnosis not present

## 2019-02-14 DIAGNOSIS — Z4789 Encounter for other orthopedic aftercare: Secondary | ICD-10-CM | POA: Diagnosis not present

## 2019-02-14 DIAGNOSIS — Z96651 Presence of right artificial knee joint: Secondary | ICD-10-CM | POA: Diagnosis not present

## 2019-02-14 DIAGNOSIS — M1711 Unilateral primary osteoarthritis, right knee: Secondary | ICD-10-CM | POA: Diagnosis not present

## 2019-02-15 DIAGNOSIS — M1711 Unilateral primary osteoarthritis, right knee: Secondary | ICD-10-CM | POA: Diagnosis not present

## 2019-02-15 DIAGNOSIS — M179 Osteoarthritis of knee, unspecified: Secondary | ICD-10-CM | POA: Diagnosis not present

## 2019-02-15 DIAGNOSIS — Z4789 Encounter for other orthopedic aftercare: Secondary | ICD-10-CM | POA: Diagnosis not present

## 2019-02-15 DIAGNOSIS — Z5189 Encounter for other specified aftercare: Secondary | ICD-10-CM | POA: Diagnosis not present

## 2019-02-15 DIAGNOSIS — M6281 Muscle weakness (generalized): Secondary | ICD-10-CM | POA: Diagnosis not present

## 2019-02-16 DIAGNOSIS — M1711 Unilateral primary osteoarthritis, right knee: Secondary | ICD-10-CM | POA: Diagnosis not present

## 2019-02-16 DIAGNOSIS — M6281 Muscle weakness (generalized): Secondary | ICD-10-CM | POA: Diagnosis not present

## 2019-02-16 DIAGNOSIS — M179 Osteoarthritis of knee, unspecified: Secondary | ICD-10-CM | POA: Diagnosis not present

## 2019-02-16 DIAGNOSIS — Z4789 Encounter for other orthopedic aftercare: Secondary | ICD-10-CM | POA: Diagnosis not present

## 2019-02-16 DIAGNOSIS — Z5189 Encounter for other specified aftercare: Secondary | ICD-10-CM | POA: Diagnosis not present

## 2019-02-17 DIAGNOSIS — M6281 Muscle weakness (generalized): Secondary | ICD-10-CM | POA: Diagnosis not present

## 2019-02-17 DIAGNOSIS — Z4789 Encounter for other orthopedic aftercare: Secondary | ICD-10-CM | POA: Diagnosis not present

## 2019-02-17 DIAGNOSIS — M179 Osteoarthritis of knee, unspecified: Secondary | ICD-10-CM | POA: Diagnosis not present

## 2019-02-17 DIAGNOSIS — M1711 Unilateral primary osteoarthritis, right knee: Secondary | ICD-10-CM | POA: Diagnosis not present

## 2019-02-17 DIAGNOSIS — Z5189 Encounter for other specified aftercare: Secondary | ICD-10-CM | POA: Diagnosis not present

## 2019-02-18 DIAGNOSIS — Z5189 Encounter for other specified aftercare: Secondary | ICD-10-CM | POA: Diagnosis not present

## 2019-02-18 DIAGNOSIS — M179 Osteoarthritis of knee, unspecified: Secondary | ICD-10-CM | POA: Diagnosis not present

## 2019-02-18 DIAGNOSIS — M6281 Muscle weakness (generalized): Secondary | ICD-10-CM | POA: Diagnosis not present

## 2019-02-18 DIAGNOSIS — M1711 Unilateral primary osteoarthritis, right knee: Secondary | ICD-10-CM | POA: Diagnosis not present

## 2019-02-18 DIAGNOSIS — Z4789 Encounter for other orthopedic aftercare: Secondary | ICD-10-CM | POA: Diagnosis not present

## 2019-02-21 DIAGNOSIS — M6281 Muscle weakness (generalized): Secondary | ICD-10-CM | POA: Diagnosis not present

## 2019-02-21 DIAGNOSIS — M1711 Unilateral primary osteoarthritis, right knee: Secondary | ICD-10-CM | POA: Diagnosis not present

## 2019-02-21 DIAGNOSIS — M179 Osteoarthritis of knee, unspecified: Secondary | ICD-10-CM | POA: Diagnosis not present

## 2019-02-21 DIAGNOSIS — Z4789 Encounter for other orthopedic aftercare: Secondary | ICD-10-CM | POA: Diagnosis not present

## 2019-02-21 DIAGNOSIS — Z5189 Encounter for other specified aftercare: Secondary | ICD-10-CM | POA: Diagnosis not present

## 2019-02-22 DIAGNOSIS — Z5189 Encounter for other specified aftercare: Secondary | ICD-10-CM | POA: Diagnosis not present

## 2019-02-22 DIAGNOSIS — M179 Osteoarthritis of knee, unspecified: Secondary | ICD-10-CM | POA: Diagnosis not present

## 2019-02-22 DIAGNOSIS — Z4789 Encounter for other orthopedic aftercare: Secondary | ICD-10-CM | POA: Diagnosis not present

## 2019-02-22 DIAGNOSIS — M1711 Unilateral primary osteoarthritis, right knee: Secondary | ICD-10-CM | POA: Diagnosis not present

## 2019-02-22 DIAGNOSIS — M6281 Muscle weakness (generalized): Secondary | ICD-10-CM | POA: Diagnosis not present

## 2019-02-23 DIAGNOSIS — M1711 Unilateral primary osteoarthritis, right knee: Secondary | ICD-10-CM | POA: Diagnosis not present

## 2019-02-23 DIAGNOSIS — M179 Osteoarthritis of knee, unspecified: Secondary | ICD-10-CM | POA: Diagnosis not present

## 2019-02-23 DIAGNOSIS — M6281 Muscle weakness (generalized): Secondary | ICD-10-CM | POA: Diagnosis not present

## 2019-02-23 DIAGNOSIS — Z5189 Encounter for other specified aftercare: Secondary | ICD-10-CM | POA: Diagnosis not present

## 2019-02-23 DIAGNOSIS — Z4789 Encounter for other orthopedic aftercare: Secondary | ICD-10-CM | POA: Diagnosis not present

## 2019-02-24 DIAGNOSIS — M179 Osteoarthritis of knee, unspecified: Secondary | ICD-10-CM | POA: Diagnosis not present

## 2019-02-24 DIAGNOSIS — Z5189 Encounter for other specified aftercare: Secondary | ICD-10-CM | POA: Diagnosis not present

## 2019-02-24 DIAGNOSIS — M1711 Unilateral primary osteoarthritis, right knee: Secondary | ICD-10-CM | POA: Diagnosis not present

## 2019-02-24 DIAGNOSIS — Z96651 Presence of right artificial knee joint: Secondary | ICD-10-CM | POA: Diagnosis not present

## 2019-02-24 DIAGNOSIS — M6281 Muscle weakness (generalized): Secondary | ICD-10-CM | POA: Diagnosis not present

## 2019-02-24 DIAGNOSIS — Z4789 Encounter for other orthopedic aftercare: Secondary | ICD-10-CM | POA: Diagnosis not present

## 2019-02-24 DIAGNOSIS — I129 Hypertensive chronic kidney disease with stage 1 through stage 4 chronic kidney disease, or unspecified chronic kidney disease: Secondary | ICD-10-CM | POA: Diagnosis not present

## 2019-02-24 DIAGNOSIS — N183 Chronic kidney disease, stage 3 (moderate): Secondary | ICD-10-CM | POA: Diagnosis not present

## 2019-02-25 DIAGNOSIS — M179 Osteoarthritis of knee, unspecified: Secondary | ICD-10-CM | POA: Diagnosis not present

## 2019-02-25 DIAGNOSIS — M6281 Muscle weakness (generalized): Secondary | ICD-10-CM | POA: Diagnosis not present

## 2019-02-25 DIAGNOSIS — Z5189 Encounter for other specified aftercare: Secondary | ICD-10-CM | POA: Diagnosis not present

## 2019-02-25 DIAGNOSIS — M1711 Unilateral primary osteoarthritis, right knee: Secondary | ICD-10-CM | POA: Diagnosis not present

## 2019-02-25 DIAGNOSIS — Z4789 Encounter for other orthopedic aftercare: Secondary | ICD-10-CM | POA: Diagnosis not present

## 2019-02-28 DIAGNOSIS — M6281 Muscle weakness (generalized): Secondary | ICD-10-CM | POA: Diagnosis not present

## 2019-02-28 DIAGNOSIS — M179 Osteoarthritis of knee, unspecified: Secondary | ICD-10-CM | POA: Diagnosis not present

## 2019-02-28 DIAGNOSIS — M1711 Unilateral primary osteoarthritis, right knee: Secondary | ICD-10-CM | POA: Diagnosis not present

## 2019-02-28 DIAGNOSIS — Z4789 Encounter for other orthopedic aftercare: Secondary | ICD-10-CM | POA: Diagnosis not present

## 2019-02-28 DIAGNOSIS — Z5189 Encounter for other specified aftercare: Secondary | ICD-10-CM | POA: Diagnosis not present

## 2019-03-01 DIAGNOSIS — Z4789 Encounter for other orthopedic aftercare: Secondary | ICD-10-CM | POA: Diagnosis not present

## 2019-03-01 DIAGNOSIS — Z5189 Encounter for other specified aftercare: Secondary | ICD-10-CM | POA: Diagnosis not present

## 2019-03-01 DIAGNOSIS — M179 Osteoarthritis of knee, unspecified: Secondary | ICD-10-CM | POA: Diagnosis not present

## 2019-03-01 DIAGNOSIS — M1711 Unilateral primary osteoarthritis, right knee: Secondary | ICD-10-CM | POA: Diagnosis not present

## 2019-03-01 DIAGNOSIS — M6281 Muscle weakness (generalized): Secondary | ICD-10-CM | POA: Diagnosis not present

## 2019-03-02 DIAGNOSIS — Z4789 Encounter for other orthopedic aftercare: Secondary | ICD-10-CM | POA: Diagnosis not present

## 2019-03-02 DIAGNOSIS — M179 Osteoarthritis of knee, unspecified: Secondary | ICD-10-CM | POA: Diagnosis not present

## 2019-03-02 DIAGNOSIS — Z96651 Presence of right artificial knee joint: Secondary | ICD-10-CM | POA: Diagnosis not present

## 2019-03-02 DIAGNOSIS — Z5189 Encounter for other specified aftercare: Secondary | ICD-10-CM | POA: Diagnosis not present

## 2019-03-02 DIAGNOSIS — M1711 Unilateral primary osteoarthritis, right knee: Secondary | ICD-10-CM | POA: Diagnosis not present

## 2019-03-02 DIAGNOSIS — Z471 Aftercare following joint replacement surgery: Secondary | ICD-10-CM | POA: Diagnosis not present

## 2019-03-02 DIAGNOSIS — M6281 Muscle weakness (generalized): Secondary | ICD-10-CM | POA: Diagnosis not present

## 2019-03-03 DIAGNOSIS — M1711 Unilateral primary osteoarthritis, right knee: Secondary | ICD-10-CM | POA: Diagnosis not present

## 2019-03-03 DIAGNOSIS — M6281 Muscle weakness (generalized): Secondary | ICD-10-CM | POA: Diagnosis not present

## 2019-03-03 DIAGNOSIS — Z4789 Encounter for other orthopedic aftercare: Secondary | ICD-10-CM | POA: Diagnosis not present

## 2019-03-03 DIAGNOSIS — Z5189 Encounter for other specified aftercare: Secondary | ICD-10-CM | POA: Diagnosis not present

## 2019-03-03 DIAGNOSIS — M179 Osteoarthritis of knee, unspecified: Secondary | ICD-10-CM | POA: Diagnosis not present

## 2019-03-04 DIAGNOSIS — M179 Osteoarthritis of knee, unspecified: Secondary | ICD-10-CM | POA: Diagnosis not present

## 2019-03-04 DIAGNOSIS — M1711 Unilateral primary osteoarthritis, right knee: Secondary | ICD-10-CM | POA: Diagnosis not present

## 2019-03-04 DIAGNOSIS — Z5189 Encounter for other specified aftercare: Secondary | ICD-10-CM | POA: Diagnosis not present

## 2019-03-04 DIAGNOSIS — M6281 Muscle weakness (generalized): Secondary | ICD-10-CM | POA: Diagnosis not present

## 2019-03-04 DIAGNOSIS — Z4789 Encounter for other orthopedic aftercare: Secondary | ICD-10-CM | POA: Diagnosis not present

## 2019-03-07 DIAGNOSIS — M1711 Unilateral primary osteoarthritis, right knee: Secondary | ICD-10-CM | POA: Diagnosis not present

## 2019-03-07 DIAGNOSIS — M179 Osteoarthritis of knee, unspecified: Secondary | ICD-10-CM | POA: Diagnosis not present

## 2019-03-07 DIAGNOSIS — M6281 Muscle weakness (generalized): Secondary | ICD-10-CM | POA: Diagnosis not present

## 2019-03-07 DIAGNOSIS — Z5189 Encounter for other specified aftercare: Secondary | ICD-10-CM | POA: Diagnosis not present

## 2019-03-07 DIAGNOSIS — Z4789 Encounter for other orthopedic aftercare: Secondary | ICD-10-CM | POA: Diagnosis not present

## 2019-03-08 DIAGNOSIS — Z4789 Encounter for other orthopedic aftercare: Secondary | ICD-10-CM | POA: Diagnosis not present

## 2019-03-08 DIAGNOSIS — M1711 Unilateral primary osteoarthritis, right knee: Secondary | ICD-10-CM | POA: Diagnosis not present

## 2019-03-08 DIAGNOSIS — Z5189 Encounter for other specified aftercare: Secondary | ICD-10-CM | POA: Diagnosis not present

## 2019-03-08 DIAGNOSIS — M179 Osteoarthritis of knee, unspecified: Secondary | ICD-10-CM | POA: Diagnosis not present

## 2019-03-08 DIAGNOSIS — M6281 Muscle weakness (generalized): Secondary | ICD-10-CM | POA: Diagnosis not present

## 2019-03-09 DIAGNOSIS — M179 Osteoarthritis of knee, unspecified: Secondary | ICD-10-CM | POA: Diagnosis not present

## 2019-03-09 DIAGNOSIS — M1711 Unilateral primary osteoarthritis, right knee: Secondary | ICD-10-CM | POA: Diagnosis not present

## 2019-03-09 DIAGNOSIS — M6281 Muscle weakness (generalized): Secondary | ICD-10-CM | POA: Diagnosis not present

## 2019-03-09 DIAGNOSIS — Z5189 Encounter for other specified aftercare: Secondary | ICD-10-CM | POA: Diagnosis not present

## 2019-03-09 DIAGNOSIS — Z4789 Encounter for other orthopedic aftercare: Secondary | ICD-10-CM | POA: Diagnosis not present

## 2019-03-10 DIAGNOSIS — M1711 Unilateral primary osteoarthritis, right knee: Secondary | ICD-10-CM | POA: Diagnosis not present

## 2019-03-10 DIAGNOSIS — M6281 Muscle weakness (generalized): Secondary | ICD-10-CM | POA: Diagnosis not present

## 2019-03-10 DIAGNOSIS — Z5189 Encounter for other specified aftercare: Secondary | ICD-10-CM | POA: Diagnosis not present

## 2019-03-10 DIAGNOSIS — M179 Osteoarthritis of knee, unspecified: Secondary | ICD-10-CM | POA: Diagnosis not present

## 2019-03-10 DIAGNOSIS — Z4789 Encounter for other orthopedic aftercare: Secondary | ICD-10-CM | POA: Diagnosis not present

## 2019-03-11 DIAGNOSIS — Z5189 Encounter for other specified aftercare: Secondary | ICD-10-CM | POA: Diagnosis not present

## 2019-03-11 DIAGNOSIS — M1711 Unilateral primary osteoarthritis, right knee: Secondary | ICD-10-CM | POA: Diagnosis not present

## 2019-03-11 DIAGNOSIS — M6281 Muscle weakness (generalized): Secondary | ICD-10-CM | POA: Diagnosis not present

## 2019-03-11 DIAGNOSIS — M179 Osteoarthritis of knee, unspecified: Secondary | ICD-10-CM | POA: Diagnosis not present

## 2019-03-11 DIAGNOSIS — Z4789 Encounter for other orthopedic aftercare: Secondary | ICD-10-CM | POA: Diagnosis not present

## 2019-03-14 DIAGNOSIS — Z5189 Encounter for other specified aftercare: Secondary | ICD-10-CM | POA: Diagnosis not present

## 2019-03-14 DIAGNOSIS — M1711 Unilateral primary osteoarthritis, right knee: Secondary | ICD-10-CM | POA: Diagnosis not present

## 2019-03-14 DIAGNOSIS — Z4789 Encounter for other orthopedic aftercare: Secondary | ICD-10-CM | POA: Diagnosis not present

## 2019-03-14 DIAGNOSIS — M6281 Muscle weakness (generalized): Secondary | ICD-10-CM | POA: Diagnosis not present

## 2019-03-14 DIAGNOSIS — M179 Osteoarthritis of knee, unspecified: Secondary | ICD-10-CM | POA: Diagnosis not present

## 2019-03-15 DIAGNOSIS — M1711 Unilateral primary osteoarthritis, right knee: Secondary | ICD-10-CM | POA: Diagnosis not present

## 2019-03-15 DIAGNOSIS — M6281 Muscle weakness (generalized): Secondary | ICD-10-CM | POA: Diagnosis not present

## 2019-03-15 DIAGNOSIS — M179 Osteoarthritis of knee, unspecified: Secondary | ICD-10-CM | POA: Diagnosis not present

## 2019-03-15 DIAGNOSIS — Z4789 Encounter for other orthopedic aftercare: Secondary | ICD-10-CM | POA: Diagnosis not present

## 2019-03-15 DIAGNOSIS — Z5189 Encounter for other specified aftercare: Secondary | ICD-10-CM | POA: Diagnosis not present

## 2019-03-16 DIAGNOSIS — M179 Osteoarthritis of knee, unspecified: Secondary | ICD-10-CM | POA: Diagnosis not present

## 2019-03-16 DIAGNOSIS — M1711 Unilateral primary osteoarthritis, right knee: Secondary | ICD-10-CM | POA: Diagnosis not present

## 2019-03-16 DIAGNOSIS — Z4789 Encounter for other orthopedic aftercare: Secondary | ICD-10-CM | POA: Diagnosis not present

## 2019-03-16 DIAGNOSIS — Z5189 Encounter for other specified aftercare: Secondary | ICD-10-CM | POA: Diagnosis not present

## 2019-03-16 DIAGNOSIS — M6281 Muscle weakness (generalized): Secondary | ICD-10-CM | POA: Diagnosis not present

## 2019-03-17 DIAGNOSIS — J449 Chronic obstructive pulmonary disease, unspecified: Secondary | ICD-10-CM | POA: Diagnosis not present

## 2019-03-17 DIAGNOSIS — I129 Hypertensive chronic kidney disease with stage 1 through stage 4 chronic kidney disease, or unspecified chronic kidney disease: Secondary | ICD-10-CM | POA: Diagnosis not present

## 2019-03-17 DIAGNOSIS — N183 Chronic kidney disease, stage 3 (moderate): Secondary | ICD-10-CM | POA: Diagnosis not present

## 2019-03-17 DIAGNOSIS — Z96651 Presence of right artificial knee joint: Secondary | ICD-10-CM | POA: Diagnosis not present

## 2019-03-18 DIAGNOSIS — Z5189 Encounter for other specified aftercare: Secondary | ICD-10-CM | POA: Diagnosis not present

## 2019-03-18 DIAGNOSIS — M6281 Muscle weakness (generalized): Secondary | ICD-10-CM | POA: Diagnosis not present

## 2019-03-18 DIAGNOSIS — M179 Osteoarthritis of knee, unspecified: Secondary | ICD-10-CM | POA: Diagnosis not present

## 2019-03-18 DIAGNOSIS — M1711 Unilateral primary osteoarthritis, right knee: Secondary | ICD-10-CM | POA: Diagnosis not present

## 2019-03-18 DIAGNOSIS — Z4789 Encounter for other orthopedic aftercare: Secondary | ICD-10-CM | POA: Diagnosis not present

## 2019-03-21 DIAGNOSIS — M6281 Muscle weakness (generalized): Secondary | ICD-10-CM | POA: Diagnosis not present

## 2019-03-21 DIAGNOSIS — Z5189 Encounter for other specified aftercare: Secondary | ICD-10-CM | POA: Diagnosis not present

## 2019-03-21 DIAGNOSIS — M1711 Unilateral primary osteoarthritis, right knee: Secondary | ICD-10-CM | POA: Diagnosis not present

## 2019-03-21 DIAGNOSIS — M179 Osteoarthritis of knee, unspecified: Secondary | ICD-10-CM | POA: Diagnosis not present

## 2019-03-21 DIAGNOSIS — Z4789 Encounter for other orthopedic aftercare: Secondary | ICD-10-CM | POA: Diagnosis not present

## 2019-03-22 DIAGNOSIS — M179 Osteoarthritis of knee, unspecified: Secondary | ICD-10-CM | POA: Diagnosis not present

## 2019-03-22 DIAGNOSIS — M6281 Muscle weakness (generalized): Secondary | ICD-10-CM | POA: Diagnosis not present

## 2019-03-22 DIAGNOSIS — M1711 Unilateral primary osteoarthritis, right knee: Secondary | ICD-10-CM | POA: Diagnosis not present

## 2019-03-22 DIAGNOSIS — Z5189 Encounter for other specified aftercare: Secondary | ICD-10-CM | POA: Diagnosis not present

## 2019-03-22 DIAGNOSIS — Z4789 Encounter for other orthopedic aftercare: Secondary | ICD-10-CM | POA: Diagnosis not present

## 2019-03-23 DIAGNOSIS — Z5189 Encounter for other specified aftercare: Secondary | ICD-10-CM | POA: Diagnosis not present

## 2019-03-23 DIAGNOSIS — M6281 Muscle weakness (generalized): Secondary | ICD-10-CM | POA: Diagnosis not present

## 2019-03-23 DIAGNOSIS — M1711 Unilateral primary osteoarthritis, right knee: Secondary | ICD-10-CM | POA: Diagnosis not present

## 2019-03-23 DIAGNOSIS — M179 Osteoarthritis of knee, unspecified: Secondary | ICD-10-CM | POA: Diagnosis not present

## 2019-03-23 DIAGNOSIS — Z4789 Encounter for other orthopedic aftercare: Secondary | ICD-10-CM | POA: Diagnosis not present

## 2019-03-25 DIAGNOSIS — M179 Osteoarthritis of knee, unspecified: Secondary | ICD-10-CM | POA: Diagnosis not present

## 2019-03-25 DIAGNOSIS — M1711 Unilateral primary osteoarthritis, right knee: Secondary | ICD-10-CM | POA: Diagnosis not present

## 2019-03-25 DIAGNOSIS — Z4789 Encounter for other orthopedic aftercare: Secondary | ICD-10-CM | POA: Diagnosis not present

## 2019-03-25 DIAGNOSIS — M6281 Muscle weakness (generalized): Secondary | ICD-10-CM | POA: Diagnosis not present

## 2019-03-25 DIAGNOSIS — Z5189 Encounter for other specified aftercare: Secondary | ICD-10-CM | POA: Diagnosis not present

## 2019-03-28 DIAGNOSIS — M179 Osteoarthritis of knee, unspecified: Secondary | ICD-10-CM | POA: Diagnosis not present

## 2019-03-28 DIAGNOSIS — Z5189 Encounter for other specified aftercare: Secondary | ICD-10-CM | POA: Diagnosis not present

## 2019-03-28 DIAGNOSIS — Z4789 Encounter for other orthopedic aftercare: Secondary | ICD-10-CM | POA: Diagnosis not present

## 2019-03-28 DIAGNOSIS — M1711 Unilateral primary osteoarthritis, right knee: Secondary | ICD-10-CM | POA: Diagnosis not present

## 2019-03-28 DIAGNOSIS — M6281 Muscle weakness (generalized): Secondary | ICD-10-CM | POA: Diagnosis not present

## 2019-03-29 DIAGNOSIS — Z5189 Encounter for other specified aftercare: Secondary | ICD-10-CM | POA: Diagnosis not present

## 2019-03-29 DIAGNOSIS — M6281 Muscle weakness (generalized): Secondary | ICD-10-CM | POA: Diagnosis not present

## 2019-03-29 DIAGNOSIS — M179 Osteoarthritis of knee, unspecified: Secondary | ICD-10-CM | POA: Diagnosis not present

## 2019-03-29 DIAGNOSIS — M1711 Unilateral primary osteoarthritis, right knee: Secondary | ICD-10-CM | POA: Diagnosis not present

## 2019-03-29 DIAGNOSIS — Z4789 Encounter for other orthopedic aftercare: Secondary | ICD-10-CM | POA: Diagnosis not present

## 2019-03-30 DIAGNOSIS — Z5189 Encounter for other specified aftercare: Secondary | ICD-10-CM | POA: Diagnosis not present

## 2019-03-30 DIAGNOSIS — M6281 Muscle weakness (generalized): Secondary | ICD-10-CM | POA: Diagnosis not present

## 2019-03-30 DIAGNOSIS — M179 Osteoarthritis of knee, unspecified: Secondary | ICD-10-CM | POA: Diagnosis not present

## 2019-03-30 DIAGNOSIS — Z4789 Encounter for other orthopedic aftercare: Secondary | ICD-10-CM | POA: Diagnosis not present

## 2019-03-30 DIAGNOSIS — M1711 Unilateral primary osteoarthritis, right knee: Secondary | ICD-10-CM | POA: Diagnosis not present

## 2019-03-31 DIAGNOSIS — M1711 Unilateral primary osteoarthritis, right knee: Secondary | ICD-10-CM | POA: Diagnosis not present

## 2019-04-01 DIAGNOSIS — Z5189 Encounter for other specified aftercare: Secondary | ICD-10-CM | POA: Diagnosis not present

## 2019-04-01 DIAGNOSIS — M179 Osteoarthritis of knee, unspecified: Secondary | ICD-10-CM | POA: Diagnosis not present

## 2019-04-01 DIAGNOSIS — M1711 Unilateral primary osteoarthritis, right knee: Secondary | ICD-10-CM | POA: Diagnosis not present

## 2019-04-01 DIAGNOSIS — Z4789 Encounter for other orthopedic aftercare: Secondary | ICD-10-CM | POA: Diagnosis not present

## 2019-04-01 DIAGNOSIS — M6281 Muscle weakness (generalized): Secondary | ICD-10-CM | POA: Diagnosis not present

## 2019-04-04 DIAGNOSIS — M179 Osteoarthritis of knee, unspecified: Secondary | ICD-10-CM | POA: Diagnosis not present

## 2019-04-04 DIAGNOSIS — M1711 Unilateral primary osteoarthritis, right knee: Secondary | ICD-10-CM | POA: Diagnosis not present

## 2019-04-04 DIAGNOSIS — Z4789 Encounter for other orthopedic aftercare: Secondary | ICD-10-CM | POA: Diagnosis not present

## 2019-04-04 DIAGNOSIS — Z5189 Encounter for other specified aftercare: Secondary | ICD-10-CM | POA: Diagnosis not present

## 2019-04-04 DIAGNOSIS — M6281 Muscle weakness (generalized): Secondary | ICD-10-CM | POA: Diagnosis not present

## 2019-04-05 DIAGNOSIS — M1711 Unilateral primary osteoarthritis, right knee: Secondary | ICD-10-CM | POA: Diagnosis not present

## 2019-04-05 DIAGNOSIS — Z5189 Encounter for other specified aftercare: Secondary | ICD-10-CM | POA: Diagnosis not present

## 2019-04-05 DIAGNOSIS — M6281 Muscle weakness (generalized): Secondary | ICD-10-CM | POA: Diagnosis not present

## 2019-04-05 DIAGNOSIS — M179 Osteoarthritis of knee, unspecified: Secondary | ICD-10-CM | POA: Diagnosis not present

## 2019-04-05 DIAGNOSIS — Z4789 Encounter for other orthopedic aftercare: Secondary | ICD-10-CM | POA: Diagnosis not present

## 2019-04-07 DIAGNOSIS — Z5189 Encounter for other specified aftercare: Secondary | ICD-10-CM | POA: Diagnosis not present

## 2019-04-07 DIAGNOSIS — M1711 Unilateral primary osteoarthritis, right knee: Secondary | ICD-10-CM | POA: Diagnosis not present

## 2019-04-07 DIAGNOSIS — Z4789 Encounter for other orthopedic aftercare: Secondary | ICD-10-CM | POA: Diagnosis not present

## 2019-04-07 DIAGNOSIS — M179 Osteoarthritis of knee, unspecified: Secondary | ICD-10-CM | POA: Diagnosis not present

## 2019-04-07 DIAGNOSIS — M6281 Muscle weakness (generalized): Secondary | ICD-10-CM | POA: Diagnosis not present

## 2019-04-08 DIAGNOSIS — M6281 Muscle weakness (generalized): Secondary | ICD-10-CM | POA: Diagnosis not present

## 2019-04-08 DIAGNOSIS — M179 Osteoarthritis of knee, unspecified: Secondary | ICD-10-CM | POA: Diagnosis not present

## 2019-04-08 DIAGNOSIS — Z4789 Encounter for other orthopedic aftercare: Secondary | ICD-10-CM | POA: Diagnosis not present

## 2019-04-08 DIAGNOSIS — Z5189 Encounter for other specified aftercare: Secondary | ICD-10-CM | POA: Diagnosis not present

## 2019-04-08 DIAGNOSIS — M1711 Unilateral primary osteoarthritis, right knee: Secondary | ICD-10-CM | POA: Diagnosis not present

## 2019-04-11 DIAGNOSIS — M6281 Muscle weakness (generalized): Secondary | ICD-10-CM | POA: Diagnosis not present

## 2019-04-11 DIAGNOSIS — M1711 Unilateral primary osteoarthritis, right knee: Secondary | ICD-10-CM | POA: Diagnosis not present

## 2019-04-11 DIAGNOSIS — Z4789 Encounter for other orthopedic aftercare: Secondary | ICD-10-CM | POA: Diagnosis not present

## 2019-04-12 DIAGNOSIS — Z4789 Encounter for other orthopedic aftercare: Secondary | ICD-10-CM | POA: Diagnosis not present

## 2019-04-12 DIAGNOSIS — M1711 Unilateral primary osteoarthritis, right knee: Secondary | ICD-10-CM | POA: Diagnosis not present

## 2019-04-12 DIAGNOSIS — M6281 Muscle weakness (generalized): Secondary | ICD-10-CM | POA: Diagnosis not present

## 2019-04-14 DIAGNOSIS — Z4789 Encounter for other orthopedic aftercare: Secondary | ICD-10-CM | POA: Diagnosis not present

## 2019-04-14 DIAGNOSIS — M6281 Muscle weakness (generalized): Secondary | ICD-10-CM | POA: Diagnosis not present

## 2019-04-14 DIAGNOSIS — K029 Dental caries, unspecified: Secondary | ICD-10-CM | POA: Diagnosis not present

## 2019-04-14 DIAGNOSIS — K0889 Other specified disorders of teeth and supporting structures: Secondary | ICD-10-CM | POA: Diagnosis not present

## 2019-04-14 DIAGNOSIS — M1711 Unilateral primary osteoarthritis, right knee: Secondary | ICD-10-CM | POA: Diagnosis not present

## 2019-04-15 DIAGNOSIS — M1711 Unilateral primary osteoarthritis, right knee: Secondary | ICD-10-CM | POA: Diagnosis not present

## 2019-04-15 DIAGNOSIS — Z4789 Encounter for other orthopedic aftercare: Secondary | ICD-10-CM | POA: Diagnosis not present

## 2019-04-15 DIAGNOSIS — M6281 Muscle weakness (generalized): Secondary | ICD-10-CM | POA: Diagnosis not present

## 2019-04-18 ENCOUNTER — Ambulatory Visit: Payer: Medicare Other | Admitting: Gastroenterology

## 2019-04-18 DIAGNOSIS — Z4789 Encounter for other orthopedic aftercare: Secondary | ICD-10-CM | POA: Diagnosis not present

## 2019-04-18 DIAGNOSIS — M1711 Unilateral primary osteoarthritis, right knee: Secondary | ICD-10-CM | POA: Diagnosis not present

## 2019-04-18 DIAGNOSIS — M6281 Muscle weakness (generalized): Secondary | ICD-10-CM | POA: Diagnosis not present

## 2019-04-19 DIAGNOSIS — M1711 Unilateral primary osteoarthritis, right knee: Secondary | ICD-10-CM | POA: Diagnosis not present

## 2019-04-19 DIAGNOSIS — Z4789 Encounter for other orthopedic aftercare: Secondary | ICD-10-CM | POA: Diagnosis not present

## 2019-04-19 DIAGNOSIS — M6281 Muscle weakness (generalized): Secondary | ICD-10-CM | POA: Diagnosis not present

## 2019-04-21 DIAGNOSIS — M6281 Muscle weakness (generalized): Secondary | ICD-10-CM | POA: Diagnosis not present

## 2019-04-21 DIAGNOSIS — Z4789 Encounter for other orthopedic aftercare: Secondary | ICD-10-CM | POA: Diagnosis not present

## 2019-04-21 DIAGNOSIS — M1711 Unilateral primary osteoarthritis, right knee: Secondary | ICD-10-CM | POA: Diagnosis not present

## 2019-04-22 DIAGNOSIS — M1711 Unilateral primary osteoarthritis, right knee: Secondary | ICD-10-CM | POA: Diagnosis not present

## 2019-04-22 DIAGNOSIS — M6281 Muscle weakness (generalized): Secondary | ICD-10-CM | POA: Diagnosis not present

## 2019-04-22 DIAGNOSIS — Z4789 Encounter for other orthopedic aftercare: Secondary | ICD-10-CM | POA: Diagnosis not present

## 2019-04-25 DIAGNOSIS — M6281 Muscle weakness (generalized): Secondary | ICD-10-CM | POA: Diagnosis not present

## 2019-04-25 DIAGNOSIS — Z4789 Encounter for other orthopedic aftercare: Secondary | ICD-10-CM | POA: Diagnosis not present

## 2019-04-25 DIAGNOSIS — M1711 Unilateral primary osteoarthritis, right knee: Secondary | ICD-10-CM | POA: Diagnosis not present

## 2019-04-26 DIAGNOSIS — M6281 Muscle weakness (generalized): Secondary | ICD-10-CM | POA: Diagnosis not present

## 2019-04-26 DIAGNOSIS — Z4789 Encounter for other orthopedic aftercare: Secondary | ICD-10-CM | POA: Diagnosis not present

## 2019-04-26 DIAGNOSIS — M1711 Unilateral primary osteoarthritis, right knee: Secondary | ICD-10-CM | POA: Diagnosis not present

## 2019-04-27 DIAGNOSIS — Z4789 Encounter for other orthopedic aftercare: Secondary | ICD-10-CM | POA: Diagnosis not present

## 2019-04-27 DIAGNOSIS — M6281 Muscle weakness (generalized): Secondary | ICD-10-CM | POA: Diagnosis not present

## 2019-04-27 DIAGNOSIS — M1711 Unilateral primary osteoarthritis, right knee: Secondary | ICD-10-CM | POA: Diagnosis not present

## 2019-04-28 DIAGNOSIS — Z4789 Encounter for other orthopedic aftercare: Secondary | ICD-10-CM | POA: Diagnosis not present

## 2019-04-28 DIAGNOSIS — M6281 Muscle weakness (generalized): Secondary | ICD-10-CM | POA: Diagnosis not present

## 2019-04-28 DIAGNOSIS — M1711 Unilateral primary osteoarthritis, right knee: Secondary | ICD-10-CM | POA: Diagnosis not present

## 2019-05-11 DIAGNOSIS — I2510 Atherosclerotic heart disease of native coronary artery without angina pectoris: Secondary | ICD-10-CM | POA: Diagnosis not present

## 2019-05-11 DIAGNOSIS — I10 Essential (primary) hypertension: Secondary | ICD-10-CM | POA: Diagnosis not present

## 2019-05-12 DIAGNOSIS — Z96651 Presence of right artificial knee joint: Secondary | ICD-10-CM | POA: Diagnosis not present

## 2019-05-12 DIAGNOSIS — D649 Anemia, unspecified: Secondary | ICD-10-CM | POA: Diagnosis not present

## 2019-05-12 DIAGNOSIS — Z7189 Other specified counseling: Secondary | ICD-10-CM | POA: Diagnosis not present

## 2019-08-23 DIAGNOSIS — K589 Irritable bowel syndrome without diarrhea: Secondary | ICD-10-CM | POA: Diagnosis not present

## 2019-08-23 DIAGNOSIS — M6281 Muscle weakness (generalized): Secondary | ICD-10-CM | POA: Diagnosis not present

## 2019-08-23 DIAGNOSIS — U071 COVID-19: Secondary | ICD-10-CM | POA: Diagnosis not present

## 2019-08-23 DIAGNOSIS — R262 Difficulty in walking, not elsewhere classified: Secondary | ICD-10-CM | POA: Diagnosis not present

## 2019-08-23 DIAGNOSIS — J449 Chronic obstructive pulmonary disease, unspecified: Secondary | ICD-10-CM | POA: Diagnosis not present

## 2019-08-23 DIAGNOSIS — G629 Polyneuropathy, unspecified: Secondary | ICD-10-CM | POA: Diagnosis not present

## 2019-08-23 DIAGNOSIS — M1711 Unilateral primary osteoarthritis, right knee: Secondary | ICD-10-CM | POA: Diagnosis not present

## 2019-08-25 DIAGNOSIS — M6281 Muscle weakness (generalized): Secondary | ICD-10-CM | POA: Diagnosis not present

## 2019-08-25 DIAGNOSIS — U071 COVID-19: Secondary | ICD-10-CM | POA: Diagnosis not present

## 2019-08-25 DIAGNOSIS — M1711 Unilateral primary osteoarthritis, right knee: Secondary | ICD-10-CM | POA: Diagnosis not present

## 2019-08-25 DIAGNOSIS — R262 Difficulty in walking, not elsewhere classified: Secondary | ICD-10-CM | POA: Diagnosis not present

## 2019-08-26 DIAGNOSIS — M6281 Muscle weakness (generalized): Secondary | ICD-10-CM | POA: Diagnosis not present

## 2019-08-26 DIAGNOSIS — M1711 Unilateral primary osteoarthritis, right knee: Secondary | ICD-10-CM | POA: Diagnosis not present

## 2019-08-26 DIAGNOSIS — R262 Difficulty in walking, not elsewhere classified: Secondary | ICD-10-CM | POA: Diagnosis not present

## 2019-08-26 DIAGNOSIS — U071 COVID-19: Secondary | ICD-10-CM | POA: Diagnosis not present

## 2019-08-29 DIAGNOSIS — U071 COVID-19: Secondary | ICD-10-CM | POA: Diagnosis not present

## 2019-08-29 DIAGNOSIS — R262 Difficulty in walking, not elsewhere classified: Secondary | ICD-10-CM | POA: Diagnosis not present

## 2019-08-29 DIAGNOSIS — M1711 Unilateral primary osteoarthritis, right knee: Secondary | ICD-10-CM | POA: Diagnosis not present

## 2019-08-29 DIAGNOSIS — M6281 Muscle weakness (generalized): Secondary | ICD-10-CM | POA: Diagnosis not present

## 2019-08-30 DIAGNOSIS — M1711 Unilateral primary osteoarthritis, right knee: Secondary | ICD-10-CM | POA: Diagnosis not present

## 2019-08-30 DIAGNOSIS — R262 Difficulty in walking, not elsewhere classified: Secondary | ICD-10-CM | POA: Diagnosis not present

## 2019-08-30 DIAGNOSIS — U071 COVID-19: Secondary | ICD-10-CM | POA: Diagnosis not present

## 2019-08-30 DIAGNOSIS — M6281 Muscle weakness (generalized): Secondary | ICD-10-CM | POA: Diagnosis not present

## 2019-08-31 DIAGNOSIS — M1711 Unilateral primary osteoarthritis, right knee: Secondary | ICD-10-CM | POA: Diagnosis not present

## 2019-08-31 DIAGNOSIS — M6281 Muscle weakness (generalized): Secondary | ICD-10-CM | POA: Diagnosis not present

## 2019-08-31 DIAGNOSIS — R262 Difficulty in walking, not elsewhere classified: Secondary | ICD-10-CM | POA: Diagnosis not present

## 2019-08-31 DIAGNOSIS — U071 COVID-19: Secondary | ICD-10-CM | POA: Diagnosis not present

## 2019-09-01 DIAGNOSIS — R262 Difficulty in walking, not elsewhere classified: Secondary | ICD-10-CM | POA: Diagnosis not present

## 2019-09-01 DIAGNOSIS — U071 COVID-19: Secondary | ICD-10-CM | POA: Diagnosis not present

## 2019-09-01 DIAGNOSIS — M6281 Muscle weakness (generalized): Secondary | ICD-10-CM | POA: Diagnosis not present

## 2019-09-01 DIAGNOSIS — M1711 Unilateral primary osteoarthritis, right knee: Secondary | ICD-10-CM | POA: Diagnosis not present

## 2019-09-04 DIAGNOSIS — R262 Difficulty in walking, not elsewhere classified: Secondary | ICD-10-CM | POA: Diagnosis not present

## 2019-09-04 DIAGNOSIS — U071 COVID-19: Secondary | ICD-10-CM | POA: Diagnosis not present

## 2019-09-04 DIAGNOSIS — M6281 Muscle weakness (generalized): Secondary | ICD-10-CM | POA: Diagnosis not present

## 2019-09-04 DIAGNOSIS — M1711 Unilateral primary osteoarthritis, right knee: Secondary | ICD-10-CM | POA: Diagnosis not present

## 2019-09-06 DIAGNOSIS — U071 COVID-19: Secondary | ICD-10-CM | POA: Diagnosis not present

## 2019-09-06 DIAGNOSIS — R262 Difficulty in walking, not elsewhere classified: Secondary | ICD-10-CM | POA: Diagnosis not present

## 2019-09-06 DIAGNOSIS — M1711 Unilateral primary osteoarthritis, right knee: Secondary | ICD-10-CM | POA: Diagnosis not present

## 2019-09-06 DIAGNOSIS — M6281 Muscle weakness (generalized): Secondary | ICD-10-CM | POA: Diagnosis not present

## 2019-09-08 DIAGNOSIS — M6281 Muscle weakness (generalized): Secondary | ICD-10-CM | POA: Diagnosis not present

## 2019-09-08 DIAGNOSIS — U071 COVID-19: Secondary | ICD-10-CM | POA: Diagnosis not present

## 2019-09-08 DIAGNOSIS — R262 Difficulty in walking, not elsewhere classified: Secondary | ICD-10-CM | POA: Diagnosis not present

## 2019-09-08 DIAGNOSIS — M1711 Unilateral primary osteoarthritis, right knee: Secondary | ICD-10-CM | POA: Diagnosis not present

## 2020-02-14 ENCOUNTER — Telehealth: Payer: Self-pay | Admitting: Orthopaedic Surgery

## 2020-02-14 NOTE — Telephone Encounter (Signed)
Call received from Wiley Ford at [facility] facility in [place], ph# [phone], requesting appointment for hip pain. Patient has been seen here in past.  Per chart notes in Epic system, patient has had knee surgery by another orthopaedist, as well as a procedure for her back.  Please review and advise.

## 2020-02-15 NOTE — Telephone Encounter (Signed)
Set up appointment.  No need to ask

## 2020-02-15 NOTE — Telephone Encounter (Signed)
At 11:01am today, 02/15/20, called back to [facility] to schedule per Dr Brooke Bonito response. Scheduler Charlesetta Ivory was paged and not available. Left her a voice message.

## 2020-02-16 NOTE — Telephone Encounter (Signed)
Scheduled appointment; faxed information to [facility] to fax [phone].

## 2020-02-23 ENCOUNTER — Ambulatory Visit (INDEPENDENT_AMBULATORY_CARE_PROVIDER_SITE_OTHER): Payer: Medicare Other | Admitting: Orthopaedic Surgery

## 2020-02-23 ENCOUNTER — Ambulatory Visit: Payer: Medicare Other

## 2020-02-23 ENCOUNTER — Encounter: Payer: Self-pay | Admitting: Orthopaedic Surgery

## 2020-02-23 ENCOUNTER — Other Ambulatory Visit: Payer: Self-pay

## 2020-02-23 VITALS — BP 108/53 | HR 59

## 2020-02-23 DIAGNOSIS — M545 Low back pain, unspecified: Secondary | ICD-10-CM

## 2020-02-23 DIAGNOSIS — N200 Calculus of kidney: Secondary | ICD-10-CM | POA: Insufficient documentation

## 2020-02-23 DIAGNOSIS — J329 Chronic sinusitis, unspecified: Secondary | ICD-10-CM | POA: Insufficient documentation

## 2020-02-23 DIAGNOSIS — I4719 Other supraventricular tachycardia: Secondary | ICD-10-CM | POA: Insufficient documentation

## 2020-02-23 DIAGNOSIS — I471 Supraventricular tachycardia: Secondary | ICD-10-CM | POA: Insufficient documentation

## 2020-02-23 NOTE — Progress Notes (Signed)
Subjective:    Patient ID: Jordan [name], female    DOB: 1945/10/06, 74 y.o.   MRN: [id number]  HPI She is resident at [facility].  She has pain in the lower back and some sciatica pain on the left.  She has no fall.  She had total knee on the right done at [facility] earlier last year.  She is not walking.  She has developed flexion contractures of the knees, more on the right.  PT is seeing her at the nursing home.  She is on oxycodone and Neurontin.  I have reviewed her notes.   Review of Systems  Constitutional: Positive for activity change.  Musculoskeletal: Positive for arthralgias, back pain, gait problem, joint swelling and myalgias.  All other systems reviewed and are negative.  For Review of Systems, all other systems reviewed and are negative.  The following is a summary of the past history medically, past history surgically, known current medicines, social history and family history.  This information is gathered electronically by the computer from prior information and documentation.  I review this each visit and have found including this information at this point in the chart is beneficial and informative.   Past Medical History:  Diagnosis Date  . Allergy    seasonal  . Anemia   . Arthritis   . Asthma    uses Advair and Spiriva daily,Albuterol prn.Takes Singulair at bedtime and Prednisone daily  . Back spasm    takes Zanaflex daily as needed  . Blood transfusion 2007   no abnormal reation noted   . Bruises easily    d/t being on Prednisone  . Bursitis    left elbow  . C. difficile colitis    2007: treated with Flagyl and Vanc, 2014: treated with vanc, Aug 2014: Vanc taper  . Chronic back pain   . Chronic kidney disease    "creatine" creeping up - followed at this time by PCP  . Complication of anesthesia 2000   woke up during one surgery- ovary surgery  . COPD (chronic obstructive pulmonary disease) ([place])   . Depression    takes Prozac daily  .  Dysphasia   . Falls   . H/O hiatal hernia   . Hemorrhoids   . History of bronchitis    last time a yr ago  . History of colon polyps   . History of kidney stones   . History of kidney stones   . Hyperlipidemia    hx of-was on meds but has been off x 1 1/2 yrs  . Hypertension    takes Verapamil nightly  . IBS (irritable bowel syndrome)   . Insomnia    takes Benadryl as needed  . 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 Neurogenic bowel   . Neuromuscular dysfunction of bladder, unspecified   . Nocturia   . Osteoporosis    takes Fosamax weekly  . Pneumonia    MRSA pneumonia in 2007  . Shortness of breath    with exertion daily  . Stroke [facility])    "they say I has a small stroke".No deficits  . Tingling    and pain in left leg  . Urinary frequency     Past Surgical History:  Procedure Laterality Date  . APPENDECTOMY    . BIOPSY  04/13/2017   Procedure: BIOPSY;  Surgeon: Daneil Dolin, MD;  [facility];  Service: Endoscopy;;  right and left colon   .  BIOPSY  07/06/2018   Procedure: BIOPSY;  Surgeon: Danie Binder, MD;  [facility];  Service: Endoscopy;;  Random Colon  . CARPAL TUNNEL RELEASE Left   . CARPAL TUNNEL RELEASE Right 09/21/2015   Procedure: CARPAL TUNNEL RELEASE;  Surgeon: Carole Civil, MD;  Location: [facility];  Service: Orthopedics;  Laterality: Right;  . cataract surgery Bilateral   . CHOLECYSTECTOMY    . COLONOSCOPY   09/24/2006   RMR: Normal rectum, left-sided diverticula  . COLONOSCOPY  Oct 2012   Dr. Henrene Pastor: tubular adenomas, moderative diverticulosis, internal hemorrhoids  . COLONOSCOPY WITH PROPOFOL N/A 04/13/2017   Dr. Gala Romney: internal hemorrhoids, diverticulosis  . CYSTOSCOPY    . ENDARTERECTOMY Right 12/12/2015   Procedure: ENDARTERECTOMY RIGHT CAROTID, RESECTION OF REDUNDANT RIGHT COMMON CAROTID ARTERY WITH PRIMARY REANASTOMOSIS;  Surgeon: Mal Misty, MD;  Location: [place];  Service: Vascular;   Laterality: Right;  . ESOPHAGOGASTRODUODENOSCOPY  04/07/2006   DVV:OHYWVP esophagus s/p placement of Bravo pH probe, some surgical changes but not typical of fundoplication wrap, may have slipped  . FLEXIBLE SIGMOIDOSCOPY N/A 07/06/2018   Procedure: FLEXIBLE SIGMOIDOSCOPY WITH PROPOFOL;  Surgeon: Danie Binder, MD;  [facility];  Service: Endoscopy;  Laterality: N/A;  needs to be done first before the other inpatient   . GANGLION CYST EXCISION Right 09/21/2015   Procedure: RIGHT WRIST GANGLION CYST REMOVAL;  Surgeon: Carole Civil, MD;  Location: [facility];  Service: Orthopedics;  Laterality: Right;  . hemorrhoidal banding    . HERNIA REPAIR     umbilical  . KNEE ARTHROSCOPY Right   . KYPHOPLASTY N/A 02/23/2018   Procedure: KYPHOPLASTY LUMBAR FOUR;  Surgeon: Newman Pies, MD;  Location: [place];  Service: Neurosurgery;  Laterality: N/A;  . LUMBAR LAMINECTOMY/DECOMPRESSION MICRODISCECTOMY Left 10/17/2013   Procedure: LUMBAR LAMINECTOMY/DECOMPRESSION MICRODISCECTOMY 1 LEVEL three/four;  Surgeon: Ophelia Charter, MD;  Location: [facility];  Service: Neurosurgery;  Laterality: Left;  . NECK SURGERY     fusion-   . NISSEN FUNDOPLICATION     x 2  . OLECRANON BURSECTOMY Left 01/11/2014   Procedure: OLECRANON BURSECTOMY;  Surgeon: Carole Civil, MD;  Location: [facility];  Service: Orthopedics;  Laterality: Left;  . OOPHORECTOMY Right   . PATCH ANGIOPLASTY Right 12/12/2015   Procedure: PATCH ANGIOPLASTY RIGHT CAROTID ARTERY USING HEMASHIELD PLATINUM FINESSE PATCH;  Surgeon: Mal Misty, MD;  Location: [place];  Service: Vascular;  Laterality: Right;  . TONSILLECTOMY      Current Outpatient Medications on File Prior to Visit  Medication Sig Dispense Refill  . acetaminophen (TYLENOL) 325 MG tablet Take 650 mg by mouth every 6 (six) hours as needed.     Marland Kitchen amLODipine (NORVASC) 5 MG tablet Take 1 tablet (5 mg total) by mouth daily. 30 tablet 0  . Cholecalciferol (VITAMIN D3 SUPER  STRENGTH) 50 MCG (2000 UT) TABS Take 1 tablet by mouth daily.     Marland Kitchen dicyclomine (BENTYL) 20 MG tablet Take 20 mg by mouth 4 (four) times daily.    Marland Kitchen FLUoxetine (PROZAC) 40 MG capsule Take 40 mg by mouth daily.      . folic acid (FOLVITE) 1 MG tablet Take 1 mg by mouth daily.    Marland Kitchen gabapentin (NEURONTIN) 400 MG capsule Take 400 mg by mouth 2 (two) times daily.    . mirtazapine (REMERON) 15 MG tablet Take 15 mg by mouth at bedtime.    . montelukast (SINGULAIR) 10 MG tablet Take 10  mg by mouth daily.     . polycarbophil (FIBERCON) 625 MG tablet Take 625 mg by mouth daily.    . predniSONE (DELTASONE) 10 MG tablet Take 5 mg by mouth daily with breakfast.     . vitamin C (ASCORBIC ACID) 500 MG tablet Take 500 mg by mouth 2 (two) times daily.    . AMINO ACIDS-PROTEIN HYDROLYS PO Take 30 mLs by mouth 2 (two) times daily. (Patient not taking: Reported on 02/23/2020)    . budesonide-formoterol (SYMBICORT) 160-4.5 MCG/ACT inhaler Inhale 2 puffs into the lungs 2 (two) times daily. (Patient not taking: Reported on 02/23/2020)    . docusate sodium (COLACE) 100 MG capsule Take 1 capsule (100 mg total) by mouth every 12 (twelve) hours. While taking oxycodone (Patient not taking: Reported on 10/18/2018) 60 capsule 0  . DULoxetine (CYMBALTA) 30 MG capsule Take 30 mg by mouth daily.    . ferrous sulfate 325 (65 FE) MG tablet Take 325 mg by mouth 2 (two) times daily with a meal. (Patient not taking: Reported on 02/23/2020)    . HYDROcodone-acetaminophen (NORCO/VICODIN) 5-325 MG tablet Take 2 tablets by mouth every 6 (six) hours as needed for moderate pain. (Patient not taking: Reported on 02/23/2020) 30 tablet 0  . ipratropium-albuterol (DUONEB) 0.5-2.5 (3) MG/3ML SOLN Take 3 mLs by nebulization every 6 (six) hours as needed (SOB). (Patient not taking: Reported on 02/23/2020)    . lisinopril (PRINIVIL,ZESTRIL) 20 MG tablet Take 20 mg by mouth daily. (Patient not taking: Reported on 02/23/2020)    . Melatonin 3 MG TABS Take 6  mg by mouth at bedtime. (Patient not taking: Reported on 02/23/2020)    . metoprolol tartrate (LOPRESSOR) 25 MG tablet Take 1 tablet (25 mg total) by mouth 2 (two) times daily. (Patient not taking: Reported on 02/23/2020) 60 tablet 0  . omeprazole (PRILOSEC) 20 MG capsule Take 40 mg by mouth daily.  (Patient not taking: Reported on 02/23/2020)    . ondansetron (ZOFRAN-ODT) 4 MG disintegrating tablet Take 4 mg by mouth every 4 (four) hours as needed for nausea or vomiting. (Patient not taking: Reported on 02/23/2020)    . Probiotic Product (ALIGN) 4 MG CAPS Take 1 capsule by mouth daily. (Patient not taking: Reported on 02/23/2020)    . thiamine 100 MG tablet Take 100 mg by mouth daily. (Patient not taking: Reported on 02/23/2020)    . tiotropium (SPIRIVA) 18 MCG inhalation capsule Place 18 mcg into inhaler and inhale daily. (Patient not taking: Reported on 02/23/2020)     No current facility-administered medications on file prior to visit.    Social History   Socioeconomic History  . Marital status: Divorced    Spouse name: Not on file  . Number of children: Not on file  . Years of education: Not on file  . Highest education level: Not on file  Occupational History  . Not on file  Tobacco Use  . Smoking status: Never Smoker  . Smokeless tobacco: Never Used  Vaping Use  . Vaping Use: Never used  Substance and Sexual Activity  . Alcohol use: Not Currently    Alcohol/week: 10.0 standard drinks    Types: 10 Cans of beer per week    Comment: couple of beers daily  . Drug use: No  . Sexual activity: Never    Birth control/protection: Post-menopausal  Other Topics Concern  . Not on file  Social History Narrative  . Not on file   Social Determinants of Health  Financial Resource Strain:   . Difficulty of Paying Living Expenses:   Food Insecurity:   . Worried About Charity fundraiser in the Last Year:   . Arboriculturist in the Last Year:   Transportation Needs:   . Lexicographer (Medical):   Marland Kitchen Lack of Transportation (Non-Medical):   Physical Activity:   . Days of Exercise per Week:   . Minutes of Exercise per Session:   Stress:   . Feeling of Stress :   Social Connections:   . Frequency of Communication with Friends and Family:   . Frequency of Social Gatherings with Friends and Family:   . Attends Religious Services:   . Active Member of Clubs or Organizations:   . Attends Archivist Meetings:   Marland Kitchen Marital Status:   Intimate Partner Violence:   . Fear of Current or Ex-Partner:   . Emotionally Abused:   Marland Kitchen Physically Abused:   . Sexually Abused:     Family History  Problem Relation Age of Onset  . Heart disease Father   . Colon cancer Neg Hx     BP (!) 108/53   Pulse (!) 59   There is no height or weight on file to calculate BMI.      Objective:   Physical Exam Vitals and nursing note reviewed.  Constitutional:      Appearance: She is well-developed.  HENT:     Head: Normocephalic and atraumatic.  Eyes:     Conjunctiva/sclera: Conjunctivae normal.     Pupils: Pupils are equal, round, and reactive to light.  Cardiovascular:     Rate and Rhythm: Normal rate and regular rhythm.  Pulmonary:     Effort: Pulmonary effort is normal.  Abdominal:     Palpations: Abdomen is soft.  Musculoskeletal:       Arms:     Cervical back: Normal range of motion and neck supple.  Skin:    General: Skin is warm and dry.  Neurological:     Mental Status: She is alert and oriented to person, place, and time.     Cranial Nerves: No cranial nerve deficit.     Motor: No abnormal muscle tone.     Coordination: Coordination normal.     Deep Tendon Reflexes: Reflexes are normal and symmetric. Reflexes normal.  Psychiatric:        Behavior: Behavior normal.        Thought Content: Thought content normal.        Judgment: Judgment normal.      X-rays were done of the lumbar spine, reported separately.  She has marked decreased  motion of the right total knee, lacking about 25 degrees from full extension.  Left knee is lacking about 5 degrees full extension.     Assessment & Plan:   Encounter Diagnosis  Name Primary?  . Lumbar pain Yes   I am concerned about new fracture of lumbar spine.  I cannot ascertain the age of the compression fractures.  I will get MRI with and without contrast.  Return in two weeks.  Continue same medicines from nursing home.  Call if any problem.  Precautions discussed.   Electronically Signed Sanjuana Kava, MD 6/17/202110:05 AM

## 2020-03-13 ENCOUNTER — Ambulatory Visit: Payer: Medicare Other | Admitting: Orthopaedic Surgery

## 2020-03-29 ENCOUNTER — Ambulatory Visit (HOSPITAL_COMMUNITY)
Admission: RE | Admit: 2020-03-29 | Discharge: 2020-03-29 | Disposition: A | Discharge status: Home / Self Care | Payer: Medicare Other | Source: Ambulatory Visit | Attending: Orthopaedic Surgery | Admitting: Orthopaedic Surgery

## 2020-03-29 ENCOUNTER — Other Ambulatory Visit: Payer: Self-pay

## 2020-03-29 DIAGNOSIS — M545 Low back pain, unspecified: Secondary | ICD-10-CM

## 2020-03-29 MED ORDER — GADOBUTROL 1 MMOL/ML IV SOLN
5.0000 mL | Freq: Once | INTRAVENOUS | Status: AC | PRN
  Administered 2020-03-29: 5 mL via INTRAVENOUS

## 2020-04-03 ENCOUNTER — Ambulatory Visit (INDEPENDENT_AMBULATORY_CARE_PROVIDER_SITE_OTHER): Payer: Medicare Other | Admitting: Orthopaedic Surgery

## 2020-04-03 ENCOUNTER — Encounter: Payer: Self-pay | Admitting: Orthopaedic Surgery

## 2020-04-03 ENCOUNTER — Other Ambulatory Visit: Payer: Self-pay

## 2020-04-03 VITALS — BP 136/81 | HR 61 | Ht 62.0 in | Wt 110.0 lb

## 2020-04-03 DIAGNOSIS — S32050A Wedge compression fracture of fifth lumbar vertebra, initial encounter for closed fracture: Secondary | ICD-10-CM

## 2020-04-03 NOTE — Progress Notes (Signed)
Patient Jordan [name] Adah Salvage, female DOB:[birth year], 74 y.o. BOF:[id number]  Chief Complaint  Patient presents with  . Back Pain  . Results    review MRI     HPI  Jordan [name] is a 74 y.o. female who has lower back pain.  MRI showed: IMPRESSION: 1. L5 right posterior element edematous signal with suspected underlying pedicle fracture. Recommend noncontrast CT correlation. 2. Subacute appearing L5 and S1 endplate fractures with mild depression. 3. Advanced degenerative disease resulting multilevel foraminal impingement, greatest on the left at L2-3 to L4-5 and bilaterally at L5-S1.  I have reviewed the MRI.  I have explained the results.  She has pain.  She is in the nursing home.  She is getting pain medicine every 12 hours.  I have recommended she get it every 6 hours.  I will see her back in one month.  I have completed forms for the nursing home.   Body mass index is 20.12 kg/m.  ROS  Review of Systems  Constitutional: Positive for activity change.  Musculoskeletal: Positive for arthralgias, back pain, gait problem, joint swelling and myalgias.  All other systems reviewed and are negative.   All other systems reviewed and are negative.  The following is a summary of the past history medically, past history surgically, known current medicines, social history and family history.  This information is gathered electronically by the computer from prior information and documentation.  I review this each visit and have found including this information at this point in the chart is beneficial and informative.    Past Medical History:  Diagnosis Date  . Allergy    seasonal  . Anemia   . Arthritis   . Asthma    uses Advair and Spiriva daily,Albuterol prn.Takes Singulair at bedtime and Prednisone daily  . Back spasm    takes Zanaflex daily as needed  . Blood transfusion 2007   no abnormal reation noted   . Bruises easily    d/t being on Prednisone  . Bursitis     left elbow  . C. difficile colitis    2007: treated with Flagyl and Vanc, 2014: treated with vanc, Aug 2014: Vanc taper  . Chronic back pain   . Chronic kidney disease    "creatine" creeping up - followed at this time by PCP  . Complication of anesthesia 2000   woke up during one surgery- ovary surgery  . COPD (chronic obstructive pulmonary disease) ([place])   . Depression    takes Prozac daily  . Dysphasia   . Falls   . H/O hiatal hernia   . Hemorrhoids   . History of bronchitis    last time a yr ago  . History of colon polyps   . History of kidney stones   . History of kidney stones   . Hyperlipidemia    hx of-was on meds but has been off x 1 1/2 yrs  . Hypertension    takes Verapamil nightly  . IBS (irritable bowel syndrome)   . Insomnia    takes Benadryl as needed  . Myocardial infarction [facility]) 2007   Takotsubo cardiomyopathy  . Neurogenic bowel   . Neuromuscular dysfunction of bladder, unspecified   . Nocturia   . Osteoporosis    takes Fosamax weekly  . Pneumonia    MRSA pneumonia in 2007  . Shortness of breath    with exertion daily  . Stroke [facility])    "they say I has a small stroke".No deficits  .  Tingling    and pain in left leg  . Urinary frequency     Past Surgical History:  Procedure Laterality Date  . APPENDECTOMY    . BIOPSY  04/13/2017   Procedure: BIOPSY;  Surgeon: Daneil Dolin, MD;  [facility];  Service: Endoscopy;;  right and left colon   . BIOPSY  07/06/2018   Procedure: BIOPSY;  Surgeon: Danie Binder, MD;  [facility];  Service: Endoscopy;;  Random Colon  . CARPAL TUNNEL RELEASE Left   . CARPAL TUNNEL RELEASE Right 09/21/2015   Procedure: CARPAL TUNNEL RELEASE;  Surgeon: Carole Civil, MD;  Location: [facility];  Service: Orthopedics;  Laterality: Right;  . cataract surgery Bilateral   . CHOLECYSTECTOMY    . COLONOSCOPY   09/24/2006   R[facility]: Normal rectum, left-sided diverticula  . COLONOSCOPY  Oct 2012   Dr.  Henrene Pastor: tubular adenomas, moderative diverticulosis, internal hemorrhoids  . COLONOSCOPY WITH PROPOFOL N/A 04/13/2017   Dr. Gala Romney: internal hemorrhoids, diverticulosis  . CYSTOSCOPY    . ENDARTERECTOMY Right 12/12/2015   Procedure: ENDARTERECTOMY RIGHT CAROTID, RESECTION OF REDUNDANT RIGHT COMMON CAROTID ARTERY WITH PRIMARY REANASTOMOSIS;  Surgeon: Mal Misty, MD;  Location: [place];  Service: Vascular;  Laterality: Right;  . ESOPHAGOGASTRODUODENOSCOPY  04/07/2006   [facility]:NOBSJG esophagus s/p placement of Bravo pH probe, some surgical changes but not typical of fundoplication wrap, may have slipped  . FLEXIBLE SIGMOIDOSCOPY N/A 07/06/2018   Procedure: FLEXIBLE SIGMOIDOSCOPY WITH PROPOFOL;  Surgeon: Danie Binder, MD;  [facility];  Service: Endoscopy;  Laterality: N/A;  needs to be done first before the other inpatient   . GANGLION CYST EXCISION Right 09/21/2015   Procedure: RIGHT WRIST GANGLION CYST REMOVAL;  Surgeon: Carole Civil, MD;  [facility]: [facility];  Service: Orthopedics;  Laterality: Right;  . hemorrhoidal banding    . HERNIA REPAIR     umbilical  . KNEE ARTHROSCOPY Right   . KYPHOPLASTY N/A 02/23/2018   Procedure: KYPHOPLASTY LUMBAR FOUR;  Surgeon: Newman Pies, MD;  Location: [place];  Service: Neurosurgery;  Laterality: N/A;  . LUMBAR LAMINECTOMY/DECOMPRESSION MICRODISCECTOMY Left 10/17/2013   Procedure: LUMBAR LAMINECTOMY/DECOMPRESSION MICRODISCECTOMY 1 LEVEL three/four;  Surgeon: Ophelia Charter, MD;  Location: [place] [facility];  Service: Neurosurgery;  Laterality: Left;  . NECK SURGERY     fusion-   . NISSEN FUNDOPLICATION     x 2  . OLECRANON BURSECTOMY Left 01/11/2014   Procedure: OLECRANON BURSECTOMY;  Surgeon: Carole Civil, MD;  [facility];  Service: Orthopedics;  Laterality: Left;  . OOPHORECTOMY Right   . PATCH ANGIOPLASTY Right 12/12/2015   Procedure: PATCH ANGIOPLASTY RIGHT CAROTID ARTERY USING HEMASHIELD PLATINUM FINESSE PATCH;  Surgeon: Mal Misty, MD;  Location: [place];  Service: Vascular;  Laterality: Right;  . TONSILLECTOMY      Family History  Problem Relation Age of Onset  . Heart disease Father   . Colon cancer Neg Hx     Social History Social History   Tobacco Use  . Smoking status: Never Smoker  . Smokeless tobacco: Never Used  Vaping Use  . Vaping Use: Never used  Substance Use Topics  . Alcohol use: Not Currently    Alcohol/week: 10.0 standard drinks    Types: 10 Cans of beer per week    Comment: couple of beers daily  . Drug use: No    Allergies  Allergen Reactions  . Cardura [Doxazosin Mesylate] Hives  .  Barbiturates Nausea And Vomiting and Rash  . Levofloxacin Rash  . Sulfa Antibiotics Nausea Only and Rash    Current Outpatient Medications  Medication Sig Dispense Refill  . acetaminophen (TYLENOL) 325 MG tablet Take 650 mg by mouth every 6 (six) hours as needed.     Marland Kitchen amLODipine (NORVASC) 2.5 MG tablet Take 2.5 mg by mouth daily.    Marland Kitchen amLODipine (NORVASC) 5 MG tablet Take 1 tablet (5 mg total) by mouth daily. 30 tablet 0  . Cholecalciferol (VITAMIN D3 SUPER STRENGTH) 50 MCG (2000 UT) TABS Take 1 tablet by mouth daily.     Marland Kitchen dicyclomine (BENTYL) 20 MG tablet Take 20 mg by mouth 4 (four) times daily.    Marland Kitchen FLUoxetine (PROZAC) 20 MG capsule Take 20 mg by mouth daily.    Marland Kitchen FLUoxetine (PROZAC) 40 MG capsule Take 40 mg by mouth daily.      . fluticasone furoate-vilanterol (BREO ELLIPTA) 100-25 MCG/INH AEPB     . folic acid (FOLVITE) 1 MG tablet Take 1 mg by mouth daily.    Marland Kitchen gabapentin (NEURONTIN) 100 MG capsule Take 100 mg by mouth daily.    Marland Kitchen gabapentin (NEURONTIN) 400 MG capsule Take 400 mg by mouth 2 (two) times daily.    Marland Kitchen lidocaine (LIDODERM) 5 % Place onto the skin.    . mirtazapine (REMERON) 15 MG tablet Take 15 mg by mouth at bedtime.    . montelukast (SINGULAIR) 10 MG tablet Take 10 mg by mouth daily.     Marland Kitchen omeprazole (PRILOSEC) 20 MG capsule Take 40 mg by mouth daily.     Marland Kitchen oxyCODONE  (OXY IR/ROXICODONE) 5 MG immediate release tablet Take by mouth.    . polycarbophil (FIBERCON) 625 MG tablet Take 625 mg by mouth daily.    . predniSONE (DELTASONE) 10 MG tablet Take 5 mg by mouth daily with breakfast.     . tiotropium (SPIRIVA) 18 MCG inhalation capsule Place 18 mcg into inhaler and inhale daily.     . vitamin C (ASCORBIC ACID) 500 MG tablet Take 500 mg by mouth 2 (two) times daily.    . AMINO ACIDS-PROTEIN HYDROLYS PO Take 30 mLs by mouth 2 (two) times daily. (Patient not taking: Reported on 02/23/2020)    . budesonide-formoterol (SYMBICORT) 160-4.5 MCG/ACT inhaler Inhale 2 puffs into the lungs 2 (two) times daily. (Patient not taking: Reported on 02/23/2020)    . docusate sodium (COLACE) 100 MG capsule Take 1 capsule (100 mg total) by mouth every 12 (twelve) hours. While taking oxycodone (Patient not taking: Reported on 10/18/2018) 60 capsule 0  . ferrous sulfate 325 (65 FE) MG tablet Take 325 mg by mouth 2 (two) times daily with a meal. (Patient not taking: Reported on 02/23/2020)    . ipratropium-albuterol (DUONEB) 0.5-2.5 (3) MG/3ML SOLN Take 3 mLs by nebulization every 6 (six) hours as needed (SOB). (Patient not taking: Reported on 02/23/2020)    . lisinopril (PRINIVIL,ZESTRIL) 20 MG tablet Take 20 mg by mouth daily. (Patient not taking: Reported on 02/23/2020)    . Melatonin 3 MG TABS Take 6 mg by mouth at bedtime. (Patient not taking: Reported on 02/23/2020)    . metoprolol tartrate (LOPRESSOR) 25 MG tablet Take 1 tablet (25 mg total) by mouth 2 (two) times daily. (Patient not taking: Reported on 02/23/2020) 60 tablet 0  . ondansetron (ZOFRAN-ODT) 4 MG disintegrating tablet Take 4 mg by mouth every 4 (four) hours as needed for nausea or vomiting. (Patient not taking: Reported on  02/23/2020)    . Probiotic Product (ALIGN) 4 MG CAPS Take 1 capsule by mouth daily. (Patient not taking: Reported on 02/23/2020)    . thiamine 100 MG tablet Take 100 mg by mouth daily. (Patient not taking:  Reported on 02/23/2020)     No current facility-administered medications for this visit.     Physical Exam  Blood pressure (!) 136/81, pulse 61, height 5\' 2"  (1.575 m), weight 110 lb (49.9 kg).  Constitutional: overall normal hygiene, normal nutrition, well developed, normal grooming, normal body habitus. Assistive device:wheelchair  Musculoskeletal: gait and station Limp unable to walk, contraction of both knees, muscle tone and strength are normal, no tremors or atrophy is present.  .  Neurological: coordination overall normal.  Deep tendon reflex/nerve stretch intact.  Sensation normal.  Cranial nerves II-XII intact.   Skin:   Normal overall no scars, lesions, ulcers or rashes. No psoriasis.  Psychiatric: Alert and oriented x 3.  Recent memory intact, remote memory unclear.  Normal mood and affect. Well groomed.  Good eye contact.  Cardiovascular: overall no swelling, no varicosities, no edema bilaterally, normal temperatures of the legs and arms, no clubbing, cyanosis and good capillary refill.  Lymphatic: palpation is normal.  All other systems reviewed and are negative   The patient has been educated about the nature of the problem(s) and counseled on treatment options.  The patient appeared to understand what I have discussed and is in agreement with it.  Encounter Diagnosis  Name Primary?  . Closed compression fracture of L5 vertebra, initial encounter ([place]) Yes    PLAN Call if any problems.  Precautions discussed.  Continue current medications.   Return to clinic 1 month   Electronically Signed Sanjuana Kava, MD 7/27/20219:24 AM

## 2020-05-08 ENCOUNTER — Ambulatory Visit (INDEPENDENT_AMBULATORY_CARE_PROVIDER_SITE_OTHER): Payer: Medicare Other | Admitting: Orthopaedic Surgery

## 2020-05-08 ENCOUNTER — Other Ambulatory Visit: Payer: Self-pay

## 2020-05-08 ENCOUNTER — Encounter: Payer: Self-pay | Admitting: Orthopaedic Surgery

## 2020-05-08 VITALS — BP 88/47 | HR 63 | Ht 62.0 in | Wt 110.0 lb

## 2020-05-08 DIAGNOSIS — S32050A Wedge compression fracture of fifth lumbar vertebra, initial encounter for closed fracture: Secondary | ICD-10-CM

## 2020-05-08 NOTE — Progress Notes (Signed)
I hurt  She is at nursing home.  She has pain in the lower back.  She has fracture of L5.  NV intact.  I will increase frequency of pain medicine.  Encounter Diagnosis  Name Primary?  . Closed compression fracture of L5 vertebra, subsequent encounter ([place]) Yes   Return in one month.  Call if any problem.  Precautions discussed.   Electronically Signed Sanjuana Kava, MD 8/31/202110:34 AM

## 2020-06-05 ENCOUNTER — Ambulatory Visit: Payer: Medicare Other | Admitting: Orthopaedic Surgery

## 2020-06-05 ENCOUNTER — Encounter: Payer: Self-pay | Admitting: Orthopaedic Surgery

## 2020-07-10 ENCOUNTER — Ambulatory Visit (HOSPITAL_COMMUNITY): Payer: Medicare Other | Admitting: Speech Pathology

## 2020-08-14 IMAGING — MR MR LUMBAR SPINE WO/W CM
7 of 10 series · 31 of 48 positions shown · IV contrast ([facility])
Comparison: 01/21/2018

CLINICAL DATA: Low back pain radiating down both legs for 1 month.

EXAM:
MRI LUMBAR SPINE WITHOUT AND WITH CONTRAST
TECHNIQUE: Multiplanar and multiecho pulse sequences of the lumbar spine were
obtained without and with intravenous contrast.
CONTRAST:  5mL GADAVIST GADOBUTROL 1 MMOL/ML IV SOLN

[Series 5: T2 · sagittal · 4.0mm · 0.68mm/px · 5 of 16 slices shown (1 of 3)]
[im 1/16]
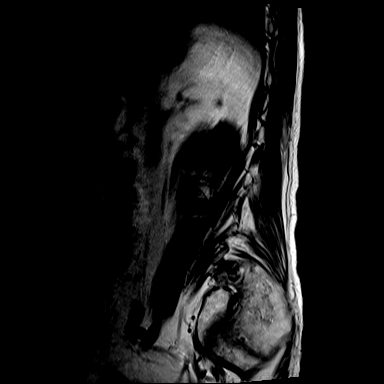
[im 4/16]
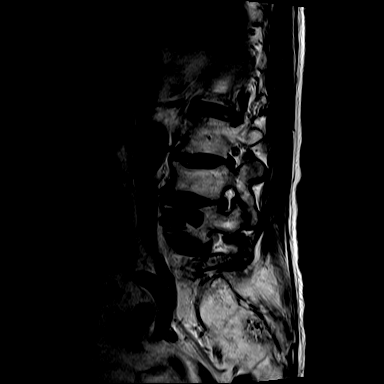
[im 8/16]
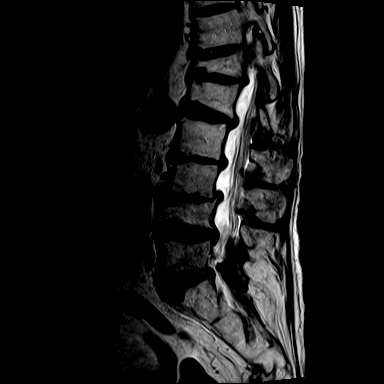
[im 12/16]
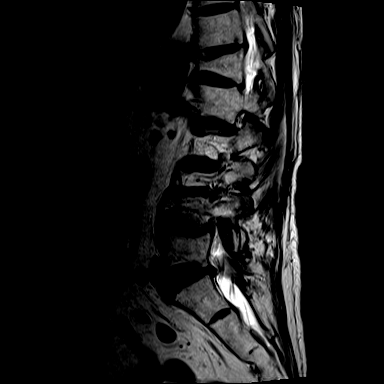
[im 16/16]
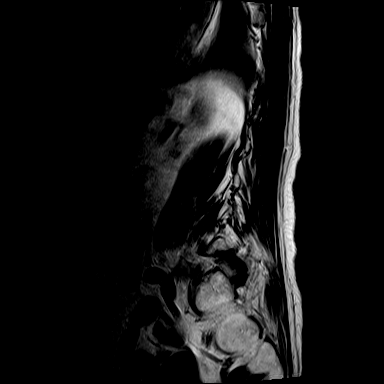

[Series 7: T1 · sagittal · 4.0mm · 0.81mm/px · 4 of 16 slices shown (1 of 3)]
[im 1/16]
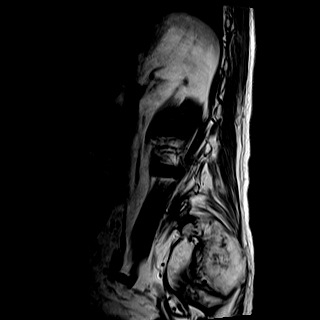
[im 6/16]
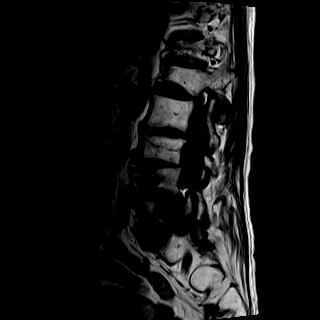
[im 11/16]
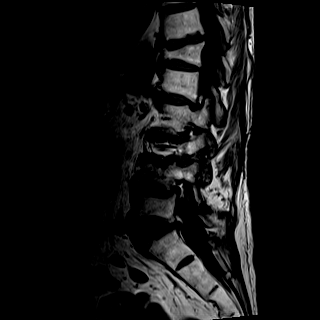
[im 16/16]
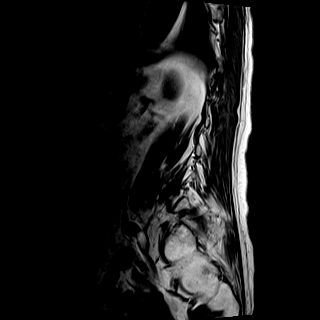

[Series 8: T2 · axial · 4.0mm · 0.70mm/px · z∈[+57,+147]mm · 5 of 19 slices shown (2 of 3)]
[im 1/19]
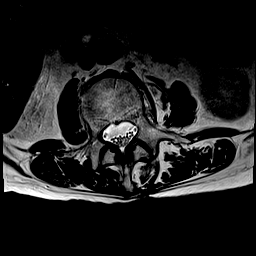
[im 5/19]
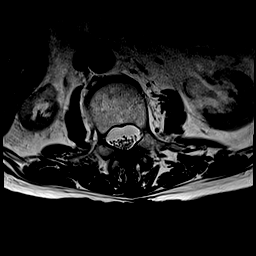
[im 10/19]
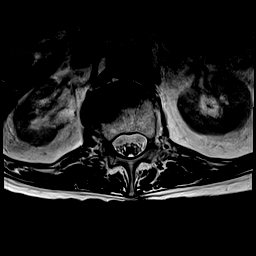
[im 14/19]
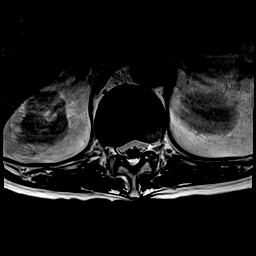
[im 19/19]
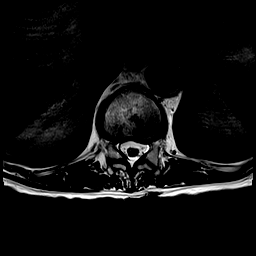

[Series 9: T2 · axial · 4.0mm · 0.70mm/px · z∈[-53,+32]mm · 5 of 18 slices shown (3 of 3)]
[im 1/18]
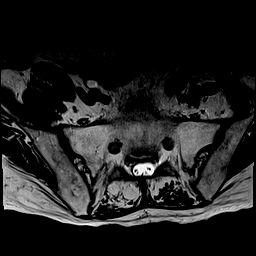
[im 5/18]
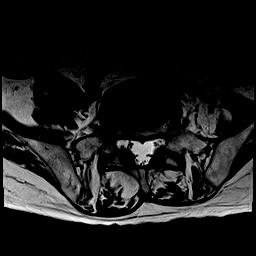
[im 9/18]
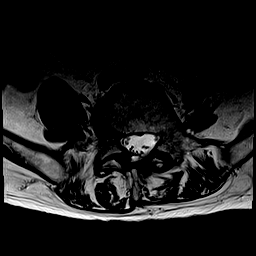
[im 13/18]
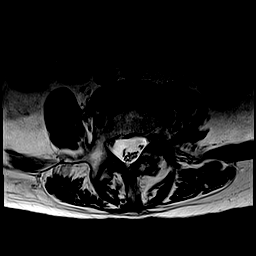
[im 18/18]
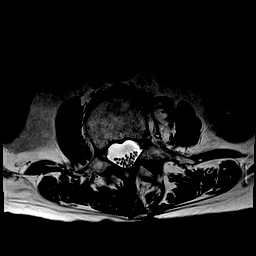

[Series 10: T1 · axial · 4.0mm · 0.35mm/px · z∈[+57,+147]mm · 5 of 19 slices shown (2 of 3)]
[im 1/19]
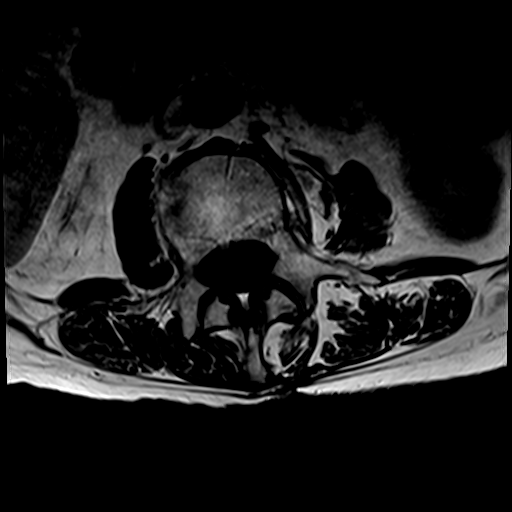
[im 5/19]
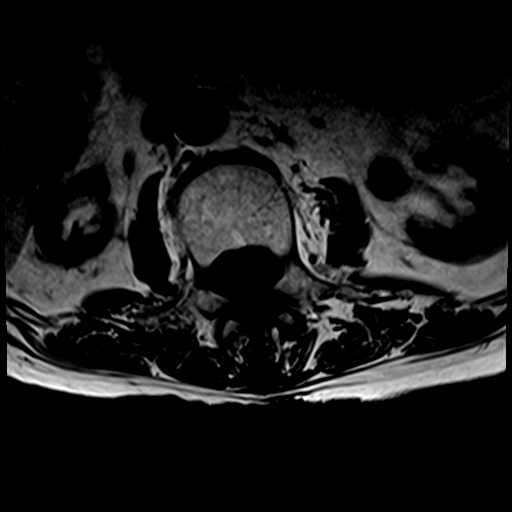
[im 10/19]
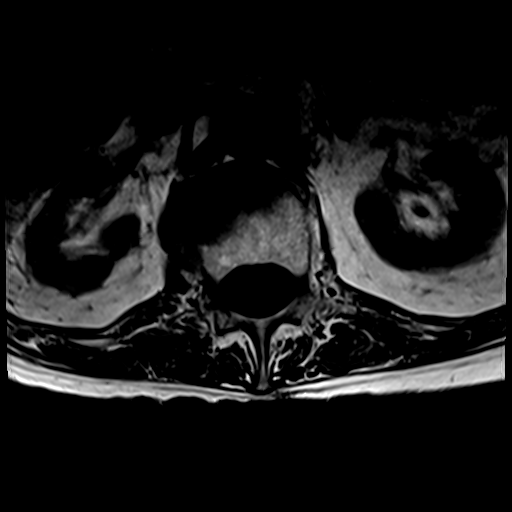
[im 14/19]
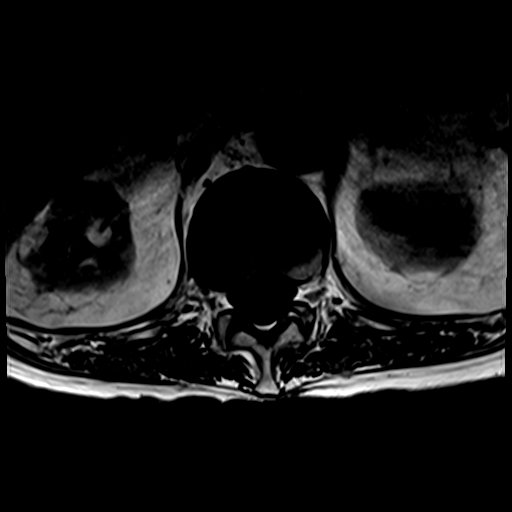
[im 19/19]
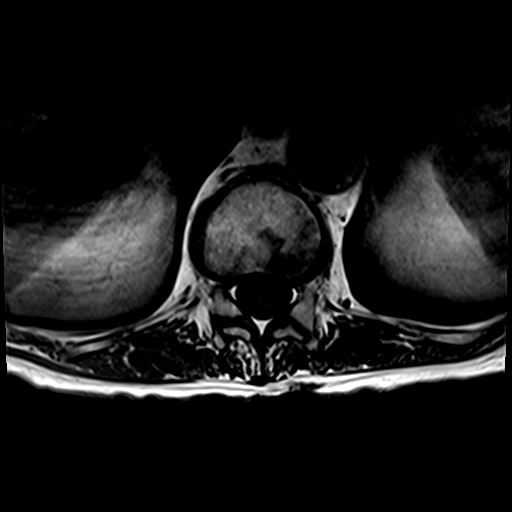

[Series 11: T1 · axial · 4.0mm · 0.35mm/px · z∈[-53,+32]mm · 5 of 18 slices shown (3 of 3)]
[im 1/18]
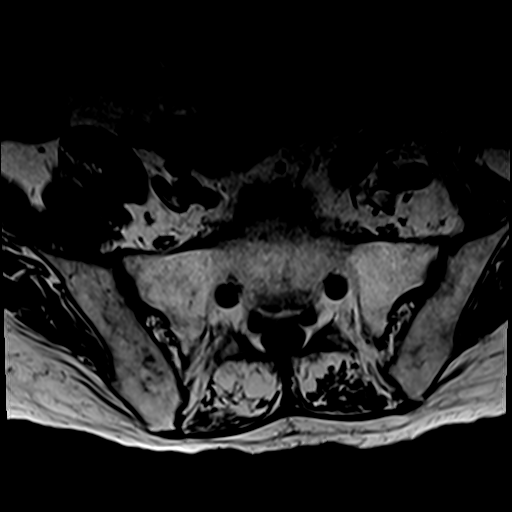
[im 5/18]
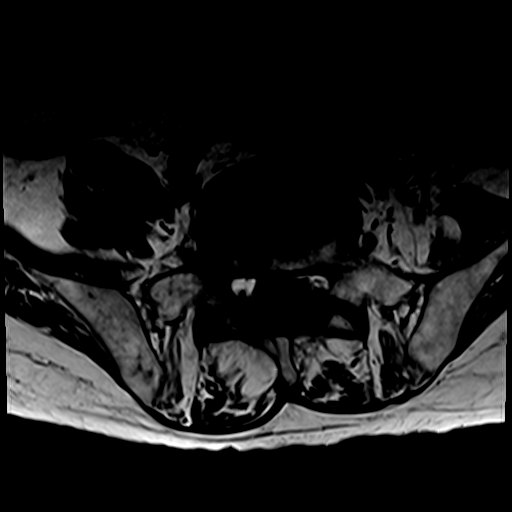
[im 9/18]
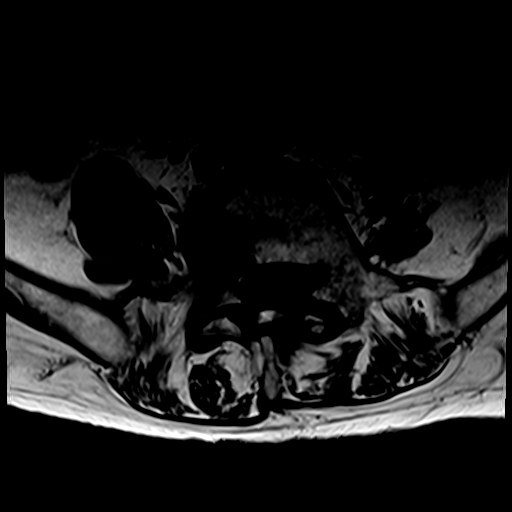
[im 13/18]
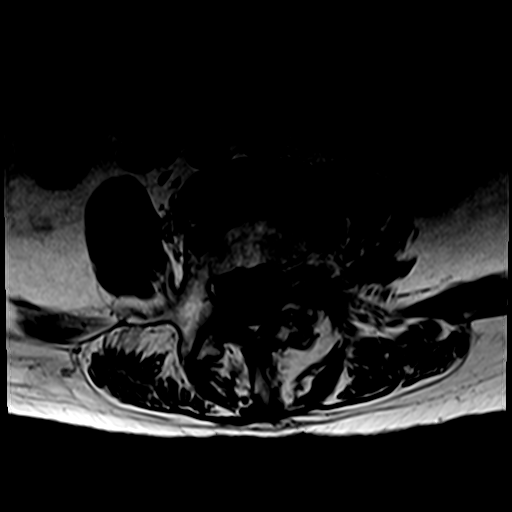
[im 18/18]
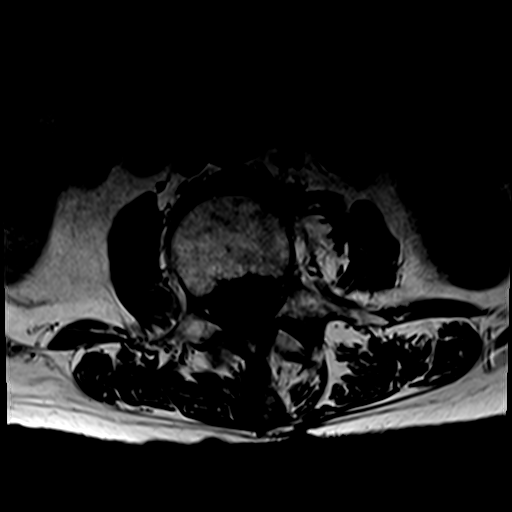

[Series 12: T1 fat-sat post-contrast · sagittal · 4.0mm · 0.81mm/px · 2 of 16 slices shown]
[im 1/16]
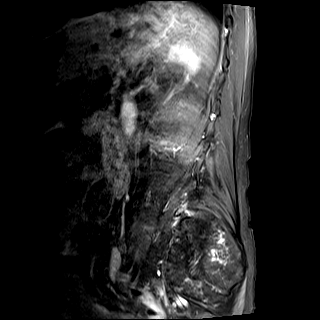
[im 6/16]
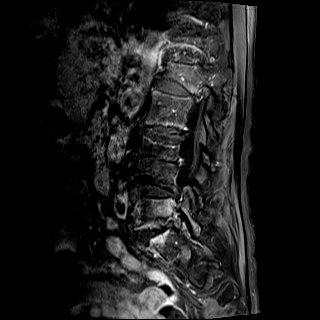

[31 of 48 positions shown; findings below may reference images not displayed]

FINDINGS: Segmentation:  Standard lumbar numbering

Alignment: Straightening of lumbar lordosis. Mild L5-S1
anterolisthesis. Mild scoliosis.

Vertebrae: Marrow edematous signal about the right posterior L5 body
extending into the pedicle and transverse process, with marrow
enhancement.

Curvilinear edematous signal along the mildly depressed inferior
endplate of L5 and following a horizontal fracture plane at the S1
superior endplate.

Remote T12 and L4 compression fractures with cement augmentation at
L4.

Conus medullaris and cauda equina: Conus extends to the L1-2 level.
Conus and cauda equina appear normal.

Paraspinal and other soft tissues: Negative for mass or inflammatory
changes

Disc levels:

T12- L1: Disc narrowing and bulging.  No impingement

L1-L2: Disc narrowing and bulging with ventral thecal sac
flattening. Mild facet spurring. Mild spinal stenosis and bilateral
foraminal narrowing

L2-L3: Disc narrowing with asymmetric left-sided collapse, bulging,
and endplate ridging. Degenerative facet spurring on the left.
Moderate to advanced left foraminal impingement

L3-L4: Disc narrowing and endplate degeneration asymmetric to the
left where there is also asymmetric facet spurring. Left foraminal
impingement with L3 root compression. Mild spinal stenosis

L4-L5: Disc narrowing and bulging. Degenerative facet spurring and
mild anterolisthesis. Moderate left foraminal impingement. Right
subarticular recess narrowing to a moderate degree.

L5-S1:Disc narrowing and bulging. Right foraminal protrusion and
facet spurring. Biforaminal L5 impingement and nerve root
flattening. Patent spinal canal
IMPRESSION: 1. L5 right posterior element edematous signal with suspected
underlying pedicle fracture. Recommend noncontrast CT correlation.
2. Subacute appearing L5 and S1 endplate fractures with mild
depression.
3. Advanced degenerative disease resulting multilevel foraminal
impingement, greatest on the left at L2-3 to L4-5 and bilaterally at
L5-S1.

## 2021-01-01 ENCOUNTER — Other Ambulatory Visit: Payer: Self-pay

## 2021-01-01 ENCOUNTER — Ambulatory Visit (INDEPENDENT_AMBULATORY_CARE_PROVIDER_SITE_OTHER): Payer: Medicare Other | Admitting: Internal Medicine

## 2021-01-01 ENCOUNTER — Encounter: Payer: Self-pay | Admitting: Internal Medicine

## 2021-01-01 VITALS — BP 121/66 | HR 72 | Temp 97.5°F | Ht 63.0 in | Wt 106.8 lb

## 2021-01-01 DIAGNOSIS — K641 Second degree hemorrhoids: Secondary | ICD-10-CM | POA: Diagnosis not present

## 2021-01-01 NOTE — Patient Instructions (Signed)
Hemorrhoid banding pamphlet provided  Questionnaire on hemorrhoid banding to be completed  Office visit with Roseanne Kaufman in the next 2 to 3 weeks for banding session  Continue dicyclomine up to 4 times a day as needed for loose bowels  Avoid straining.

## 2021-01-01 NOTE — Progress Notes (Signed)
Primary Care Physician:  Caprice Renshaw, MD Primary Gastroenterologist:  Dr. Gala Romney  Pre-Procedure History & Physical: HPI:  Jordan [name] is a 75 y.o. female here for evaluation of hemorrhoids.  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  That illness is now behind her.  She does take dicyclomine up to 4 times a day as needed to avoid excessive stooling.  She has 1-2 [place] 5-6 stools daily as she describes.  She is not passing any blood.  She has had some anal pain recently with wiping.  No bleeding.  Nursing staff note prolapsing hemorrhoids.  Patient can feel hemorrhoids from time to time they come out and they go back in. She reminded me that she has had hemorrhoids banded here in this office several years ago with good results for a period of time (prior to her episodes of recurrent CDI).  Currently, I cannot find those records in our system.  She stated that this helped her hemorrhoids tremendously and she did not have a problem for several years.   Last colonoscopy 2019-hemorrhoids left-sided diverticula; Future colonoscopy not recommended.  She has had no abdominal pain.  Appetite is described as being very good.  No nausea, vomiting odynophagia, dysphagia or early satiety.  Past Medical History:  Diagnosis Date  . Allergy    seasonal  . Anemia   . Arthritis   . Asthma    uses Advair and Spiriva daily,Albuterol prn.Takes Singulair at bedtime and Prednisone daily  . Back spasm    takes Zanaflex daily as needed  . Blood transfusion 2007   no abnormal reation noted   . Bruises easily    d/t being on Prednisone  . Bursitis    left elbow  . C. difficile colitis    2007: treated with Flagyl and Vanc, 2014: treated with vanc, Aug 2014: Vanc taper  . Chronic back pain   . Chronic kidney disease    "creatine" creeping up - followed at this time by PCP  . Complication of anesthesia 2000   woke up during one surgery- ovary  surgery  . COPD (chronic obstructive pulmonary disease) ([place])   . Depression    takes Prozac daily  . Dysphasia   . Falls   . H/O hiatal hernia   . Hemorrhoids   . History of bronchitis    last time a yr ago  . History of colon polyps   . History of kidney stones   . History of kidney stones   . Hyperlipidemia    hx of-was on meds but has been off x 1 1/2 yrs  . Hypertension    takes Verapamil nightly  . IBS (irritable bowel syndrome)   . Insomnia    takes Benadryl as needed  . 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 Neurogenic bowel   . Neuromuscular dysfunction of bladder, unspecified   . Nocturia   . Osteoporosis    takes Fosamax weekly  . Pneumonia    MRSA pneumonia in 2007  . Shortness of breath    with exertion daily  . Stroke [facility])    "they say I has a small stroke".No deficits  . Tingling    and pain in left leg  . Urinary frequency     Past Surgical History:  Procedure Laterality Date  . APPENDECTOMY    . BIOPSY  04/13/2017   Procedure: BIOPSY;  Surgeon: Daneil Dolin,  MD;  [facility];  Service: Endoscopy;;  right and left colon   . BIOPSY  07/06/2018   Procedure: BIOPSY;  Surgeon: Danie Binder, MD;  [facility];  Service: Endoscopy;;  Random Colon  . CARPAL TUNNEL RELEASE Left   . CARPAL TUNNEL RELEASE Right 09/21/2015   Procedure: CARPAL TUNNEL RELEASE;  Surgeon: Carole Civil, MD;  Location: [facility];  Service: Orthopedics;  Laterality: Right;  . cataract surgery Bilateral   . CHOLECYSTECTOMY    . COLONOSCOPY   09/24/2006   RMR: Normal rectum, left-sided diverticula  . COLONOSCOPY  Oct 2012   Dr. Henrene Pastor: tubular adenomas, moderative diverticulosis, internal hemorrhoids  . COLONOSCOPY WITH PROPOFOL N/A 04/13/2017   Dr. Gala Romney: internal hemorrhoids, diverticulosis  . CYSTOSCOPY    . ENDARTERECTOMY Right 12/12/2015   Procedure: ENDARTERECTOMY RIGHT CAROTID, RESECTION OF REDUNDANT RIGHT COMMON CAROTID  ARTERY WITH PRIMARY REANASTOMOSIS;  Surgeon: Mal Misty, MD;  Location: [place];  Service: Vascular;  Laterality: Right;  . ESOPHAGOGASTRODUODENOSCOPY  04/07/2006   MGQ:QPYPPJ esophagus s/p placement of Bravo pH probe, some surgical changes but not typical of fundoplication wrap, may have slipped  . FLEXIBLE SIGMOIDOSCOPY N/A 07/06/2018   Procedure: FLEXIBLE SIGMOIDOSCOPY WITH PROPOFOL;  Surgeon: Danie Binder, MD;  [facility];  Service: Endoscopy;  Laterality: N/A;  needs to be done first before the other inpatient   . GANGLION CYST EXCISION Right 09/21/2015   Procedure: RIGHT WRIST GANGLION CYST REMOVAL;  Surgeon: Carole Civil, MD;  Location: [facility];  Service: Orthopedics;  Laterality: Right;  . hemorrhoidal banding    . HERNIA REPAIR     umbilical  . KNEE ARTHROSCOPY Right   . KYPHOPLASTY N/A 02/23/2018   Procedure: KYPHOPLASTY LUMBAR FOUR;  Surgeon: Newman Pies, MD;  Location: [place];  Service: Neurosurgery;  Laterality: N/A;  . LUMBAR LAMINECTOMY/DECOMPRESSION MICRODISCECTOMY Left 10/17/2013   Procedure: LUMBAR LAMINECTOMY/DECOMPRESSION MICRODISCECTOMY 1 LEVEL three/four;  Surgeon: Ophelia Charter, MD;  Location: [place] [facility];  Service: Neurosurgery;  Laterality: Left;  . NECK SURGERY     fusion-   . NISSEN FUNDOPLICATION     x 2  . OLECRANON BURSECTOMY Left 01/11/2014   Procedure: OLECRANON BURSECTOMY;  Surgeon: Carole Civil, MD;  Location: [facility];  Service: Orthopedics;  Laterality: Left;  . OOPHORECTOMY Right   . PATCH ANGIOPLASTY Right 12/12/2015   Procedure: PATCH ANGIOPLASTY RIGHT CAROTID ARTERY USING HEMASHIELD PLATINUM FINESSE PATCH;  Surgeon: Mal Misty, MD;  Location: [place];  Service: Vascular;  Laterality: Right;  . TONSILLECTOMY      Prior to Admission medications   Medication Sig Start Date End Date Taking? Authorizing Provider  acetaminophen (TYLENOL) 325 MG tablet Take 650 mg by mouth every 6 (six) hours as needed.    Yes [provider]  albuterol (ACCUNEB) 0.63 MG/3ML nebulizer solution Take 1 ampule by nebulization every 6 (six) hours as needed for wheezing.   Yes [provider]  amLODipine (NORVASC) 2.5 MG tablet Take 2.5 mg by mouth daily. 03/03/20  Yes [provider]  Cholecalciferol (VITAMIN D3 SUPER STRENGTH) 50 MCG (2000 UT) TABS Take 1 tablet by mouth daily.    Yes [provider]  diclofenac Sodium (VOLTAREN) 1 % GEL Apply 4 g topically every 8 (eight) hours as needed.   Yes [provider]  dicyclomine (BENTYL) 20 MG tablet Take 20 mg by mouth 4 (four) times daily.   Yes [provider]  FLUoxetine (PROZAC) 20 MG capsule Take 20 mg by mouth daily. 03/15/20  Yes [provider]  fluticasone furoate-vilanterol (BREO ELLIPTA) 100-25 MCG/INH AEPB Inhale 1 puff into the lungs daily. 11/11/19  Yes [provider]  gabapentin (NEURONTIN) 100 MG capsule Take 100 mg by mouth daily. 03/16/20  Yes [provider]  iron polysaccharides (NIFEREX) 150 MG capsule Take 1 capsule by mouth daily.   Yes [provider]  lidocaine (LIDODERM) 5 % Place onto the skin.   Yes [provider]  Melatonin 3 MG TABS Take 6 mg by mouth at bedtime.    Yes [provider]  metoprolol tartrate (LOPRESSOR) 25 MG tablet Take 1 tablet (25 mg total) by mouth 2 (two) times daily. 07/19/18  Yes Cristal Deer, MD  mirtazapine (REMERON) 15 MG tablet Take 15 mg by mouth at bedtime.   Yes [provider]  montelukast (SINGULAIR) 10 MG tablet Take 10 mg by mouth daily.   Yes [provider]  ondansetron (ZOFRAN-ODT) 4 MG disintegrating tablet Take 4 mg by mouth every 4 (four) hours as needed for nausea or vomiting.    Yes [provider]  oxyCODONE (OXY IR/ROXICODONE) 5 MG immediate release tablet Take 5 mg by mouth every 12 (twelve) hours as needed.   Yes [provider]  predniSONE (DELTASONE) 10 MG tablet Take 5 mg  by mouth daily with breakfast.    Yes [provider]  SANTYL ointment Apply 1 application topically 3 (three) times daily. 12/06/20  Yes [provider]  senna-docusate (SENOKOT-S) 8.6-50 MG tablet Take 1 tablet by mouth 2 (two) times daily.   Yes [provider]  tiotropium (SPIRIVA) 18 MCG inhalation capsule Place 18 mcg into inhaler and inhale daily.    Yes [provider]  vitamin C (ASCORBIC ACID) 500 MG tablet Take 500 mg by mouth daily.   Yes [provider]    Allergies as of 01/01/2021 - Review Complete 01/01/2021  Allergen Reaction Noted  . Cardura [doxazosin mesylate] Hives 09/14/2013  . Other  10/10/2013  . Barbiturates Nausea And Vomiting and Rash 05/04/2009  . Levofloxacin Rash 12/06/2015  . Sulfa antibiotics Nausea Only and Rash 07/07/2011    Family History  Problem Relation Age of Onset  . Heart disease Father   . Colon cancer Neg Hx     Social History   Socioeconomic History  . Marital status: Divorced    Spouse name: Not on file  . Number of children: Not on file  . Years of education: Not on file  . Highest education level: Not on file  Occupational History  . Not on file  Tobacco Use  . Smoking status: Never Smoker  . Smokeless tobacco: Never Used  Vaping Use  . Vaping Use: Never used  Substance and Sexual Activity  . Alcohol use: Not Currently    Alcohol/week: 10.0 standard drinks    Types: 10 Cans of beer per week    Comment: couple of beers daily  . Drug use: No  . Sexual activity: Never    Birth control/protection: Post-menopausal  Other Topics Concern  . Not on file  Social History Narrative  . Not on file   Social Determinants of Health   Financial Resource Strain: Not on file  Food Insecurity: Not on file  Transportation Needs: Not on file  Physical Activity: Not on file  Stress: Not on file  Social Connections: Not on file  Intimate Partner Violence: Not on file  Review of  Systems: See HPI, otherwise negative ROS  Physical Exam: BP 121/66   Pulse 72   Temp (!) 97.5 F (36.4 C)   Ht 5\' 3"  (1.6 m)   Wt 106 lb 12.8 oz (48.4 kg)   BMI 18.92 kg/m  General:   Frail, elderly lady who is in a wheelchair.  She is mentally clear and gives an excellent history.   Lungs:  Clear throughout to auscultation.   No wheezes, crackles, or rhonchi. No acute distress. Heart:  Regular rate and rhythm; no murmurs, clicks, rubs,  or gallops. Abdomen: Non-distended, normal bowel sounds.  Soft and nontender without appreciable mass or hepatosplenomegaly.  Pulses:  Normal pulses noted. Extremities:  Without clubbing or edema. Rectal: No external lesions.  DRE reveals moderately good sphincter tone no mass in the rectal vault.  No stool in the rectal vault.  Impression/Plan: Pleasant 75 year old lady with multiple comorbidities presents with symptoms compelling for hemorrhoid disease.  She has known hemorrhoids.  By history, she did well with hemorrhoid banding previously.  She is describing grade 2 hemorrhoids.  Certainly, does not have any evidence of grade 3/4 hemorrhoids today.  Hemorrhoids noted on retroflexion at prior colonoscopy.  Her episodes of recurrent CDI likely exacerbated hemorrhoidal disease over time.  All things considered, she would likely be a reasonably good hemorrhoid banding candidate at this time.  Recommendations:  Hemorrhoid banding pamphlet provided  Questionnaire on hemorrhoid banding to be completed  Office visit with Roseanne Kaufman in the next 2 to 3 weeks for banding session  Continue dicyclomine up to 4 times a day as needed for loose bowels  Avoid straining.  We will research the medical record further.   Notice: This dictation was prepared with Dragon dictation along with smaller phrase technology. Any transcriptional errors that result from this process are unintentional and may not be corrected upon review.

## 2021-03-14 ENCOUNTER — Ambulatory Visit: Payer: Medicare Other | Admitting: Gastroenterology

## 2021-03-21 ENCOUNTER — Telehealth: Payer: Self-pay | Admitting: Orthopaedic Surgery

## 2021-03-21 NOTE — Telephone Encounter (Signed)
Call received from [place] at [place] at [place] facility for patient to possibly be scheduled for a shoulder injection. Relayed per chart that patient is under care of another orthopaedic surgeon; therefore, to please check with the other orthopaedist as an injection for a separate orthopaedic problem may not be recommended. Michela Pitcher will check there.

## 2021-04-04 ENCOUNTER — Ambulatory Visit (INDEPENDENT_AMBULATORY_CARE_PROVIDER_SITE_OTHER): Payer: Medicare Other | Admitting: Gastroenterology

## 2021-04-04 ENCOUNTER — Encounter: Payer: Self-pay | Admitting: Gastroenterology

## 2021-04-04 ENCOUNTER — Other Ambulatory Visit: Payer: Self-pay

## 2021-04-04 DIAGNOSIS — K642 Third degree hemorrhoids: Secondary | ICD-10-CM | POA: Insufficient documentation

## 2021-04-04 MED ORDER — DICYCLOMINE HCL 10 MG PO CAPS: 10.0000 mg | ORAL_CAPSULE | Freq: Three times a day (TID) | ORAL | 3 refills | Status: DC

## 2021-04-04 NOTE — Progress Notes (Signed)
[facility] Banding Note:   Very pleasant 75 year old female well-known to this practice. Hemorrhoid banding in 2014/2015 with overall good response. Recurrent symptomatic Grade 3 hemorrhoids now with bleeding, pressure, and soiling. Last colonoscopy 2019.    The patient presents with symptomatic grade 3 hemorrhoids, unresponsive to maximal medical therapy, requesting rubber band ligation of his/her hemorrhoidal disease. All risks, benefits, and alternative forms of therapy were described and informed consent was obtained.   Initially, it was difficult to obtain any tissue. I attempted neutrally, with good results. Banding neutrally completed. Likely with her anatomy, I initially "overshot" the depth and need to pursue more proximally, which we will do at the next visit. Digital anorectal examination was then performed to assure proper positioning of the band, and to adjust the banded tissue as required. The patient was discharged home without pain or other issues. Dietary and behavioral recommendations were given and (if necessary prescriptions were given), along with follow-up instructions. She has requested that dicyclomine be scheduled instead of prn, as it is difficult to obtain at the nursing facility. I have requested this be resumed dicyclomine 10 mg TID before meals and at bedtime, holding for constipation. The patient will return in next available for followup and possible additional banding as required.  No complications were encountered and the patient tolerated the procedure well.  Annitta Needs, PhD, ANP-BC [facility]

## 2021-04-04 NOTE — Patient Instructions (Signed)
I have requested that dicyclomine be scheduled, 3 times a day before meals and at bedtime. Hold if constipation.  We will see you in follow-up for next banding!  I enjoyed seeing you again today! As you know, I value our relationship and want to provide genuine, compassionate, and quality care. I welcome your feedback. If you receive a survey regarding your visit,  I greatly appreciate you taking time to fill this out. See you next time!  Annitta Needs, PhD, ANP-BC [facility]

## 2021-05-06 ENCOUNTER — Telehealth: Payer: Self-pay | Admitting: Internal Medicine

## 2021-05-06 NOTE — Telephone Encounter (Signed)
Pt called stating that she seen Jordan [name] the beginning of Aug and had a banding done. Pt states that the hemorrhoid is still the same as before the banding. Pt is going to the bathroom every time she eats. Pt is having diarrhea and that it is very watery. Pt is not having any rectal bleeding. Pt states that the nursing home gave her some imodium for the diarrhea yesterday but that she hasn't had any imodium today for the diarrhea. Pt is not having any nausea, vomiting or fever.

## 2021-05-06 NOTE — Telephone Encounter (Signed)
Pt states that she has not been given the dicyclomine tid that they started giving it to her yesterday, but that she will make sure she starts taking it tid and let us know if the diarrhea doesn't get better. Informed pt about the banding and she verbalized understanding.

## 2021-05-06 NOTE — Telephone Encounter (Signed)
Please call patient, she is having trouble with her hemorrhoid   she is scheduled with anna in the future for another banding but she is having trouble when she goes to the bathroom

## 2021-05-06 NOTE — Telephone Encounter (Signed)
Is she taking dicyclomine TID before meals and at bedtime? If worsening diarrhea, needs stool studies. There are 3 columns that we have to address, so she may not have improvement in symptoms after only the first one.

## 2021-07-24 ENCOUNTER — Emergency Department (HOSPITAL_COMMUNITY)
Admission: EM | Admit: 2021-07-24 | Discharge: 2021-07-25 | Disposition: A | Discharge status: Skilled Nursing Facility | Payer: Medicare Other | Attending: Emergency Medicine | Admitting: Emergency Medicine

## 2021-07-24 ENCOUNTER — Other Ambulatory Visit: Payer: Self-pay

## 2021-07-24 DIAGNOSIS — I129 Hypertensive chronic kidney disease with stage 1 through stage 4 chronic kidney disease, or unspecified chronic kidney disease: Secondary | ICD-10-CM | POA: Insufficient documentation

## 2021-07-24 DIAGNOSIS — Z79899 Other long term (current) drug therapy: Secondary | ICD-10-CM | POA: Insufficient documentation

## 2021-07-24 DIAGNOSIS — M545 Low back pain, unspecified: Secondary | ICD-10-CM | POA: Diagnosis not present

## 2021-07-24 DIAGNOSIS — R1084 Generalized abdominal pain: Secondary | ICD-10-CM | POA: Insufficient documentation

## 2021-07-24 DIAGNOSIS — J449 Chronic obstructive pulmonary disease, unspecified: Secondary | ICD-10-CM | POA: Diagnosis not present

## 2021-07-24 DIAGNOSIS — J45909 Unspecified asthma, uncomplicated: Secondary | ICD-10-CM | POA: Diagnosis not present

## 2021-07-24 DIAGNOSIS — N3001 Acute cystitis with hematuria: Secondary | ICD-10-CM | POA: Insufficient documentation

## 2021-07-24 DIAGNOSIS — R110 Nausea: Secondary | ICD-10-CM | POA: Diagnosis not present

## 2021-07-24 DIAGNOSIS — N183 Chronic kidney disease, stage 3 unspecified: Secondary | ICD-10-CM | POA: Insufficient documentation

## 2021-07-24 LAB — COMPREHENSIVE METABOLIC PANEL
ALT: 17 U/L (ref 0–44)
AST: 18 U/L (ref 15–41)
Albumin: 3.6 g/dL (ref 3.5–5.0)
Alkaline Phosphatase: 98 U/L (ref 38–126)
Anion gap: 8 (ref 5–15)
BUN: 31 mg/dL — ABNORMAL HIGH (ref 8–23)
CO2: 24 mmol/L (ref 22–32)
Calcium: 8.9 mg/dL (ref 8.9–10.3)
Chloride: 104 mmol/L (ref 98–111)
Creatinine, Ser: 1.65 mg/dL — ABNORMAL HIGH (ref 0.44–1.00)
GFR, Estimated: 32 mL/min — ABNORMAL LOW (ref >60)
Glucose, Bld: 103 mg/dL — ABNORMAL HIGH (ref 70–99)
Potassium: 5.1 mmol/L (ref 3.5–5.1)
Sodium: 136 mmol/L (ref 135–145)
Total Bilirubin: 0.3 mg/dL (ref 0.3–1.2)
Total Protein: 7.3 g/dL (ref 6.5–8.1)

## 2021-07-24 LAB — CBC
HCT: 32 % — ABNORMAL LOW (ref 36.0–46.0)
Hemoglobin: 10.1 g/dL — ABNORMAL LOW (ref 12.0–15.0)
MCH: 31.3 pg (ref 26.0–34.0)
MCHC: 31.6 g/dL (ref 30.0–36.0)
MCV: 99.1 fL (ref 80.0–100.0)
Platelets: 226 10*3/uL (ref 150–400)
RBC: 3.23 MIL/uL — ABNORMAL LOW (ref 3.87–5.11)
RDW: 13 % (ref 11.5–15.5)
WBC: 9.7 10*3/uL (ref 4.0–10.5)
nRBC: 0 % (ref 0.0–0.2)

## 2021-07-24 LAB — LIPASE, BLOOD: Lipase: 28 U/L (ref 11–51)

## 2021-07-24 NOTE — ED Triage Notes (Signed)
Presents from [place] for abdominal pain since yesterday, has orders for xray tomorrow, unable to wait d/t pain. [id number] with EMS. H/o anemia, CKD, htn.

## 2021-07-25 ENCOUNTER — Emergency Department (HOSPITAL_COMMUNITY): Payer: Medicare Other

## 2021-07-25 DIAGNOSIS — R1084 Generalized abdominal pain: Secondary | ICD-10-CM | POA: Diagnosis not present

## 2021-07-25 LAB — URINALYSIS, ROUTINE W REFLEX MICROSCOPIC
Bilirubin Urine: NEGATIVE
Glucose, UA: NEGATIVE mg/dL
Ketones, ur: NEGATIVE mg/dL
Nitrite: NEGATIVE
Protein, ur: NEGATIVE mg/dL
Specific Gravity, Urine: 1.01 (ref 1.005–1.030)
pH: 7 (ref 5.0–8.0)

## 2021-07-25 MED ORDER — CEPHALEXIN 500 MG PO CAPS: 500.0000 mg | ORAL_CAPSULE | Freq: Two times a day (BID) | ORAL | 0 refills | Status: DC

## 2021-07-25 MED ORDER — LACTATED RINGERS IV BOLUS
1000.0000 mL | Freq: Once | INTRAVENOUS | Status: AC
  Administered 2021-07-25: 1000 mL via INTRAVENOUS

## 2021-07-25 MED ORDER — FENTANYL CITRATE PF 50 MCG/ML IJ SOSY
50.0000 ug | PREFILLED_SYRINGE | Freq: Once | INTRAMUSCULAR | Status: AC
  Administered 2021-07-25: 50 ug via INTRAVENOUS
  Filled 2021-07-25: qty 1

## 2021-07-25 MED ORDER — ONDANSETRON HCL 4 MG/2ML IJ SOLN
4.0000 mg | Freq: Once | INTRAMUSCULAR | Status: AC
  Administered 2021-07-25: 4 mg via INTRAVENOUS
  Filled 2021-07-25: qty 2

## 2021-07-25 MED ORDER — SODIUM CHLORIDE 0.9 % IV SOLN
1.0000 g | Freq: Once | INTRAVENOUS | Status: AC
  Administered 2021-07-25: 04:00:00 1 g via INTRAVENOUS
  Filled 2021-07-25: qty 10

## 2021-07-25 NOTE — ED Notes (Signed)
Patient transported to CT 

## 2021-07-25 NOTE — ED Provider Notes (Signed)
[facility] EMERGENCY DEPARTMENT Provider Note   CSN: [id number] Arrival date & time: 07/24/21  2154     History No chief complaint on file.   Jordan [name] is a 75 y.o. female.  The history is provided by the patient.  Abdominal Pain Pain location:  Generalized Pain quality: aching   Pain radiates to:  Back Pain severity:  Moderate Onset quality:  Gradual Duration:  1 day Timing:  Constant Progression:  Worsening Chronicity:  New Relieved by:  Nothing Worsened by:  Movement and palpation Associated symptoms: nausea   Associated symptoms: no constipation, no diarrhea, no fever and no vomiting      Patient w/ history of asthma, chronic pain, chronic kidney disease, IBS presents with abdominal pain.  Patient presents from local nursing facility.  She reports over the past day she has been having abdominal pain as well as back pain.  No falls or trauma.  No new weakness.  No vomiting.  No recent diarrhea, no constipation. Past Medical History:  Diagnosis Date   Allergy    seasonal   Anemia    Arthritis    Asthma    uses Advair and Spiriva daily,Albuterol prn.Takes Singulair at bedtime and Prednisone daily   Back spasm    takes Zanaflex daily as needed   Blood transfusion 2007   no abnormal reation noted    Bruises easily    d/t being on Prednisone   Bursitis    left elbow   C. difficile colitis    2007: treated with Flagyl and Vanc, 2014: treated with vanc, Aug 2014: Vanc taper   Chronic back pain    Chronic kidney disease    "creatine" creeping up - followed at this time by PCP   Complication of anesthesia 2000   woke up during one surgery- ovary surgery   COPD (chronic obstructive pulmonary disease) ([place])    Depression    takes Prozac daily   Dysphasia    Falls    H/O hiatal hernia    Hemorrhoids    History of bronchitis    last time a yr ago   History of colon polyps    History of kidney stones    History of kidney stones    Hyperlipidemia    hx  of-was on meds but has been off x 1 1/2 yrs   Hypertension    takes Verapamil nightly   IBS (irritable bowel syndrome)    Insomnia    takes Benadryl as needed   Myocardial infarction [facility]) 2007   Takotsubo cardiomyopathy   Neurogenic bowel    Neuromuscular dysfunction of bladder, unspecified    Nocturia    Osteoporosis    takes Fosamax weekly   Pneumonia    MRSA pneumonia in 2007   Shortness of breath    with exertion daily   Stroke [facility])    "they say I has a small stroke".No deficits   Tingling    and pain in left leg   Urinary frequency     Patient Active Problem List   Diagnosis Date Noted   Prolapsed internal hemorrhoids, grade 3 04/04/2021   PAT (paroxysmal atrial tachycardia) ([place]) 02/23/2020   Renal stones 02/23/2020   Sinusitis 02/23/2020   Aftercare following right knee joint replacement surgery 12/01/2018   GERD (gastroesophageal reflux disease) 11/18/2018   CKD (chronic kidney disease) stage 3, GFR 30-59 ml/min (HCC) 11/17/2018   IBS (irritable bowel syndrome) 11/17/2018   Ileus (HCC)    Clostridium  difficile colitis    Paralytic ileus of large intestine ([place])    Pressure injury of skin 07/09/2018   Protein-calorie malnutrition, severe 07/09/2018   Pseudomembranous colitis    Megacolon due to infectious colitis 07/04/2018   AKI (acute kidney injury) ([place]) 07/04/2018   Hypokalemia 07/04/2018   Lumbar compression fracture ([place]) 02/23/2018   S/P cervical spinal fusion 08/27/2017   Depression 06/02/2017   Dysphagia 06/02/2017   Impaired mobility and ADLs 06/02/2017   Neurocognitive deficits 06/02/2017   Neurogenic bladder 06/02/2017   Neurogenic bowel 06/02/2017   Displaced fracture of first cervical vertebra ([place]) 05/05/2017   Fall 05/05/2017   Anemia 02/20/2017   History of colon polyps 02/20/2017   Chronic diarrhea 02/20/2017   Abnormal weight loss 02/20/2017   Carotid stenosis, symptomatic w/o infarct 01/01/2016   Carotid stenosis 11/27/2015    Ganglion cyst of wrist    Hematoma 01/23/2014   Lumbar foraminal stenosis 10/17/2013   Olecranon bursitis of left elbow 09/14/2013   Left elbow pain 09/14/2013   C. difficile diarrhea 06/20/2013   Loose stools 03/23/2013   TAKOTSUBO SYNDROME 05/04/2009   PALPITATIONS 05/04/2009   CHEST PAIN UNSPECIFIED 05/04/2009   Essential hypertension 04/24/2009   ASTHMA 04/24/2009   COPD (chronic obstructive pulmonary disease) ([place]) 04/24/2009   Steroid-dependent asthma 04/24/2009   MRSA (methicillin resistant staphylococcus aureus) pneumonia ([place]) 09/05/2005    Past Surgical History:  Procedure Laterality Date   APPENDECTOMY     BIOPSY  04/13/2017   Procedure: BIOPSY;  Surgeon: Daneil Dolin, MD;  Location: [facility];  Service: Endoscopy;;  right and left colon    BIOPSY  07/06/2018   Procedure: BIOPSY;  Surgeon: Danie Binder, MD;  Location: [facility];  Service: Endoscopy;;  Random Colon   CARPAL TUNNEL RELEASE Left    CARPAL TUNNEL RELEASE Right 09/21/2015   Procedure: CARPAL TUNNEL RELEASE;  Surgeon: Carole Civil, MD;  Location: [facility] ORS;  Service: Orthopedics;  Laterality: Right;   cataract surgery Bilateral    CHOLECYSTECTOMY     COLONOSCOPY   09/24/2006   RMR: Normal rectum, left-sided diverticula   COLONOSCOPY  Oct 2012   Dr. Henrene Pastor: tubular adenomas, moderative diverticulosis, internal hemorrhoids   COLONOSCOPY WITH PROPOFOL N/A 04/13/2017   Dr. Gala Romney: internal hemorrhoids, diverticulosis   CYSTOSCOPY     ENDARTERECTOMY Right 12/12/2015   Procedure: ENDARTERECTOMY RIGHT CAROTID, RESECTION OF REDUNDANT RIGHT COMMON CAROTID ARTERY WITH PRIMARY REANASTOMOSIS;  Surgeon: Mal Misty, MD;  Location: [facility] OR;  Service: Vascular;  Laterality: Right;   ESOPHAGOGASTRODUODENOSCOPY  04/07/2006   KAJ:GOTLXB esophagus s/p placement of Bravo pH probe, some surgical changes but not typical of fundoplication wrap, may have slipped   FLEXIBLE SIGMOIDOSCOPY N/A 07/06/2018   Procedure:  FLEXIBLE SIGMOIDOSCOPY WITH PROPOFOL;  Surgeon: Danie Binder, MD;  [facility];  Service: Endoscopy;  Laterality: N/A;  needs to be done first before the other inpatient    GANGLION CYST EXCISION Right 09/21/2015   Procedure: RIGHT WRIST GANGLION CYST REMOVAL;  Surgeon: Carole Civil, MD;  Location: [facility] OR[facility];  Service: Orthopedics;  Laterality: Right;   hemorrhoidal banding     HERNIA REPAIR     umbilical   KNEE ARTHROSCOPY Right    KYPHOPLASTY N/A 02/23/2018   Procedure: KYPHOPLASTY LUMBAR FOUR;  Surgeon: Newman Pies, MD;  Location: [place];  Service: Neurosurgery;  Laterality: N/A;   LUMBAR LAMINECTOMY/DECOMPRESSION MICRODISCECTOMY Left 10/17/2013   Procedure: LUMBAR LAMINECTOMY/DECOMPRESSION MICRODISCECTOMY 1 LEVEL three/four;  Surgeon: Ophelia Charter, MD;  Location: [place] [facility];  Service: Neurosurgery;  Laterality: Left;   NECK SURGERY     fusion-    NISSEN FUNDOPLICATION     x 2   OLECRANON BURSECTOMY Left 01/11/2014   Procedure: OLECRANON BURSECTOMY;  Surgeon: Carole Civil, MD;  Location: [facility];  Service: Orthopedics;  Laterality: Left;   OOPHORECTOMY Right    PATCH ANGIOPLASTY Right 12/12/2015   Procedure: PATCH ANGIOPLASTY RIGHT CAROTID ARTERY USING HEMASHIELD PLATINUM FINESSE PATCH;  Surgeon: Mal Misty, MD;  Location: [place];  Service: Vascular;  Laterality: Right;   TONSILLECTOMY       OB History   No obstetric history on file.     Family History  Problem Relation Age of Onset   Heart disease Father    Colon cancer Neg Hx     Social History   Tobacco Use   Smoking status: Never   Smokeless tobacco: Never  Vaping Use   Vaping Use: Never used  Substance Use Topics   Alcohol use: Not Currently    Alcohol/week: 10.0 standard drinks    Types: 10 Cans of beer per week    Comment: couple of beers daily   Drug use: No    Home Medications Prior to Admission medications   Medication Sig Start Date End Date Taking? Authorizing Provider   cephALEXin (KEFLEX) 500 MG capsule Take 1 capsule (500 mg total) by mouth 2 (two) times daily. 07/25/21  Yes Ripley Fraise, MD  acetaminophen (TYLENOL) 325 MG tablet Take 650 mg by mouth every 6 (six) hours as needed.     [provider]  albuterol (ACCUNEB) 0.63 MG/3ML nebulizer solution Take 1 ampule by nebulization every 6 (six) hours as needed for wheezing.    [provider]  amLODipine (NORVASC) 2.5 MG tablet Take 2.5 mg by mouth daily. 03/03/20   [provider]  Cholecalciferol (VITAMIN D3 SUPER STRENGTH) 50 MCG (2000 UT) TABS Take 1 tablet by mouth daily.     [provider]  diclofenac Sodium (VOLTAREN) 1 % GEL Apply 4 g topically every 8 (eight) hours as needed.    [provider]  dicyclomine (BENTYL) 10 MG capsule Take 1 capsule (10 mg total) by mouth 4 (four) times daily -  before meals and at bedtime. 04/04/21   Annitta Needs, NP  FLUoxetine (PROZAC) 40 MG capsule Take 40 mg by mouth daily.    [provider]  fluticasone furoate-vilanterol (BREO ELLIPTA) 100-25 MCG/INH AEPB Inhale 1 puff into the lungs daily. 11/11/19   [provider]  gabapentin (NEURONTIN) 100 MG capsule Take 100 mg by mouth daily. 03/16/20   [provider]  iron polysaccharides (NIFEREX) 150 MG capsule Take 1 capsule by mouth daily.    [provider]  lidocaine (LIDODERM) 5 % Place onto the skin.    [provider]  Melatonin 3 MG TABS Take 6 mg by mouth at bedtime.     [provider]  metoprolol tartrate (LOPRESSOR) 25 MG tablet Take 1 tablet (25 mg total) by mouth 2 (two) times daily. 07/19/18   Cristal Deer, MD  mirtazapine (REMERON) 15 MG tablet Take 15 mg by mouth at bedtime.    [provider]  montelukast (SINGULAIR) 10 MG tablet Take 10 mg by mouth daily. Patient not taking: Reported on 04/04/2021    [provider]  ondansetron (ZOFRAN-ODT) 4 MG disintegrating tablet Take 4 mg by  mouth every 4 (four)  hours as needed for nausea or vomiting.     [provider]  oxyCODONE (OXY IR/ROXICODONE) 5 MG immediate release tablet Take 5 mg by mouth every 12 (twelve) hours as needed.    [provider]  predniSONE (DELTASONE) 10 MG tablet Take 5 mg by mouth daily with breakfast.     [provider]  SANTYL ointment Apply 1 application topically 3 (three) times daily. 12/06/20   [provider]  senna-docusate (SENOKOT-S) 8.6-50 MG tablet Take 1 tablet by mouth 2 (two) times daily.    [provider]  tiotropium (SPIRIVA) 18 MCG inhalation capsule Place 18 mcg into inhaler and inhale daily.     [provider]  vitamin C (ASCORBIC ACID) 500 MG tablet Take 500 mg by mouth daily.    [provider]    Allergies    Cardura [doxazosin mesylate], Other, Barbiturates, Levofloxacin, and Sulfa antibiotics  Review of Systems   Review of Systems  Constitutional:  Negative for fever.  Gastrointestinal:  Positive for abdominal pain and nausea. Negative for constipation, diarrhea and vomiting.  All other systems reviewed and are negative.  Physical Exam Updated Vital Signs BP 128/66   Pulse 60   Temp 98.2 F (36.8 C) (Oral)   Resp 17   SpO2 96%   Physical Exam CONSTITUTIONAL: Elderly, no acute distress HEAD: Normocephalic/atraumatic EYES: EOMI/PERRL, no icterus ENMT: Mucous membranes dry NECK: supple no meningeal signs SPINE/BACK: Lumbar spinal & paraspinal tenderness, no bruising/crepitance/stepoffs noted to spine CV: S1/S2 noted, no murmurs/rubs/gallops noted LUNGS: Lungs are clear to auscultation bilaterally, no apparent distress ABDOMEN: soft, diffuse mild tenderness, no rebound or guarding, bowel sounds noted throughout abdomen GU:no cva tenderness NEURO: Pt is awake/alert/appropriate, moves all extremitiesx4.  No facial droop.  No focal weakness noted in the lower extremities EXTREMITIES: pulses normal/equal,  full ROM SKIN: warm, color normal PSYCH: no abnormalities of mood noted, alert and oriented to situation  ED Results / Procedures / Treatments   Labs (all labs ordered are listed, but only abnormal results are displayed) Labs Reviewed  COMPREHENSIVE METABOLIC PANEL - Abnormal; Notable for the following components:      Result Value   Glucose, Bld 103 (*)    BUN 31 (*)    Creatinine, Ser 1.65 (*)    GFR, Estimated 32 (*)    All other components within normal limits  CBC - Abnormal; Notable for the following components:   RBC 3.23 (*)    Hemoglobin 10.1 (*)    HCT 32.0 (*)    All other components within normal limits  URINALYSIS, ROUTINE W REFLEX MICROSCOPIC - Abnormal; Notable for the following components:   APPearance CLOUDY (*)    Hgb urine dipstick MODERATE (*)    Leukocytes,Ua LARGE (*)    All other components within normal limits  LIPASE, BLOOD    EKG EKG Interpretation  Date/Time:  Thursday July 25 2021 00:16:02 EST Ventricular Rate:  66 PR Interval:  206 QRS Duration: 118 QT Interval:  435 QTC Calculation: 456 R Axis:   115 Text Interpretation: Sinus rhythm Incomplete right bundle branch block Low voltage, extremity leads Nonspecific T abnormalities, lateral leads Confirmed by Ripley Fraise [phone]) on 07/25/2021 12:39:39 AM  Radiology CT ABDOMEN PELVIS WO CONTRAST  Result Date: 07/25/2021 CLINICAL DATA:  Abdominal pain. EXAM: CT ABDOMEN AND PELVIS WITHOUT CONTRAST TECHNIQUE: Multidetector CT imaging of the abdomen and pelvis was performed following the standard protocol without IV contrast. COMPARISON:  CT abdomen pelvis dated 07/04/2018.  FINDINGS: Evaluation of this exam is limited in the absence of intravenous contrast. Evaluation is also limited due to streak artifact caused by patient's arms. Lower chest: The visualized lung bases are clear. There is coronary vascular calcification. No intra-abdominal free air or free fluid. Hepatobiliary: The liver is  unremarkable no intrahepatic biliary dilatation. Cholecystectomy. Pancreas: Unremarkable. No pancreatic ductal dilatation or surrounding inflammatory changes. Spleen: Normal in size without focal abnormality. Adrenals/Urinary Tract: The adrenal glands unremarkable. Large staghorn calculus in right kidney extending from the renal pelvis into the lower pole and measuring up to approximately 5 cm in length. There is a 4 mm nonobstructing left renal interpolar calculus. There is no hydronephrosis on either side. The visualized ureters and urinary bladder appear unremarkable Stomach/Bowel: There is no bowel obstruction or active inflammation. The appendix is not visualized with certainty. No inflammatory changes identified in the right lower quadrant. Vascular/Lymphatic: Moderate aortoiliac atherosclerotic disease. The IVC is unremarkable. No portal venous gas. There is no adenopathy. Reproductive: The uterus and ovaries are grossly unremarkable. Other: None Musculoskeletal: Osteopenia with scoliosis and degenerative changes of the spine. L4 old compression fracture and vertebroplasty. Old T12 compression fracture with anterior wedging. No acute osseous pathology. IMPRESSION: 1. No acute intra-abdominal or pelvic pathology. 2. Large staghorn calculus in right kidney. No hydronephrosis. 3. Aortic Atherosclerosis (ICD10-I70.0). Electronically Signed   By: Anner Crete M.D.   On: 07/25/2021 02:56    Procedures Procedures   Medications Ordered in ED Medications  lactated ringers bolus 1,000 mL (0 mLs Intravenous Stopped 07/25/21 0230)  fentaNYL (SUBLIMAZE) injection 50 mcg (50 mcg Intravenous Given 07/25/21 0028)  ondansetron (ZOFRAN) injection 4 mg (4 mg Intravenous Given 07/25/21 0027)  cefTRIAXone (ROCEPHIN) 1 g in sodium chloride 0.9 % 100 mL IVPB (1 g Intravenous New Bag/Given 07/25/21 0416)    ED Course  I have reviewed the triage vital signs and the nursing notes.  Pertinent labs & imaging results  that were available during my care of the patient were reviewed by me and considered in my medical decision making (see chart for details).    MDM Rules/Calculators/A&P                           This patient presents to the ED for concern of abdominal pain, this involves an extensive number of treatment options, and is a complaint that carries with it a high risk of complications and morbidity.  The differential diagnosis includes bowel obstruction, bowel perforation, UTI, ischemic bowel, diverticulitis, colitis, pancreatitis   Lab Tests:  I Ordered, reviewed, and interpreted labs, which included electrolytes, liver function panel, complete blood count,  Medicines ordered:  I ordered medication fentanyl for pain  Imaging Studies ordered:  I ordered imaging studies which included CT abdomen pelvis  I independently visualized and interpreted imaging which showed no acute findings, old compression fracture  Additional history obtained:   Previous records obtained and reviewed   Reevaluation:  After the interventions stated above, I reevaluated the patient and found patient is improved  Patient presented from nursing facility for abdominal pain and back pain.  CT imaging does not find any acute issues.  She has old compression fractures.  Patient was found to have UTI and dehydration.  Patient was given IV fluids and antibiotics and appears improved. She will be discharged back to facility on antibiotic Final Clinical Impression(s) / ED Diagnoses Final diagnoses:  Generalized abdominal pain  Acute cystitis with hematuria  Rx / DC Orders ED Discharge Orders          Ordered    cephALEXin (KEFLEX) 500 MG capsule  2 times daily        07/25/21 0543             Ripley Fraise, MD 07/25/21 [phone]

## 2021-07-31 ENCOUNTER — Other Ambulatory Visit: Payer: Self-pay

## 2021-07-31 ENCOUNTER — Emergency Department (HOSPITAL_COMMUNITY)
Admission: EM | Admit: 2021-07-31 | Discharge: 2021-07-31 | Disposition: A | Discharge status: Home / Self Care | Payer: Medicare Other | Attending: Emergency Medicine | Admitting: Emergency Medicine

## 2021-07-31 ENCOUNTER — Encounter (HOSPITAL_COMMUNITY): Payer: Self-pay

## 2021-07-31 DIAGNOSIS — N183 Chronic kidney disease, stage 3 unspecified: Secondary | ICD-10-CM | POA: Diagnosis not present

## 2021-07-31 DIAGNOSIS — K219 Gastro-esophageal reflux disease without esophagitis: Secondary | ICD-10-CM | POA: Insufficient documentation

## 2021-07-31 DIAGNOSIS — J45909 Unspecified asthma, uncomplicated: Secondary | ICD-10-CM | POA: Diagnosis not present

## 2021-07-31 DIAGNOSIS — N200 Calculus of kidney: Secondary | ICD-10-CM | POA: Diagnosis not present

## 2021-07-31 DIAGNOSIS — I129 Hypertensive chronic kidney disease with stage 1 through stage 4 chronic kidney disease, or unspecified chronic kidney disease: Secondary | ICD-10-CM | POA: Diagnosis not present

## 2021-07-31 DIAGNOSIS — R109 Unspecified abdominal pain: Secondary | ICD-10-CM

## 2021-07-31 DIAGNOSIS — Z7951 Long term (current) use of inhaled steroids: Secondary | ICD-10-CM | POA: Diagnosis not present

## 2021-07-31 DIAGNOSIS — Z79899 Other long term (current) drug therapy: Secondary | ICD-10-CM | POA: Diagnosis not present

## 2021-07-31 DIAGNOSIS — J449 Chronic obstructive pulmonary disease, unspecified: Secondary | ICD-10-CM | POA: Insufficient documentation

## 2021-07-31 LAB — CBC WITH DIFFERENTIAL/PLATELET
Abs Immature Granulocytes: 0.06 10*3/uL (ref 0.00–0.07)
Basophils Absolute: 0.1 10*3/uL (ref 0.0–0.1)
Basophils Relative: 1 %
Eosinophils Absolute: 0.5 10*3/uL (ref 0.0–0.5)
Eosinophils Relative: 5 %
HCT: 33.2 % — ABNORMAL LOW (ref 36.0–46.0)
Hemoglobin: 10.8 g/dL — ABNORMAL LOW (ref 12.0–15.0)
Immature Granulocytes: 1 %
Lymphocytes Relative: 9 %
Lymphs Abs: 0.8 10*3/uL (ref 0.7–4.0)
MCH: 31.4 pg (ref 26.0–34.0)
MCHC: 32.5 g/dL (ref 30.0–36.0)
MCV: 96.5 fL (ref 80.0–100.0)
Monocytes Absolute: 0.6 10*3/uL (ref 0.1–1.0)
Monocytes Relative: 8 %
Neutro Abs: 6.5 10*3/uL (ref 1.7–7.7)
Neutrophils Relative %: 76 %
Platelets: 237 10*3/uL (ref 150–400)
RBC: 3.44 MIL/uL — ABNORMAL LOW (ref 3.87–5.11)
RDW: 12.8 % (ref 11.5–15.5)
WBC: 8.5 10*3/uL (ref 4.0–10.5)
nRBC: 0 % (ref 0.0–0.2)

## 2021-07-31 LAB — URINALYSIS, ROUTINE W REFLEX MICROSCOPIC
Bilirubin Urine: NEGATIVE
Glucose, UA: NEGATIVE mg/dL
Ketones, ur: NEGATIVE mg/dL
Nitrite: NEGATIVE
Protein, ur: NEGATIVE mg/dL
Specific Gravity, Urine: 1.01 (ref 1.005–1.030)
pH: 6.5 (ref 5.0–8.0)

## 2021-07-31 LAB — COMPREHENSIVE METABOLIC PANEL
ALT: 17 U/L (ref 0–44)
AST: 20 U/L (ref 15–41)
Albumin: 3.7 g/dL (ref 3.5–5.0)
Alkaline Phosphatase: 91 U/L (ref 38–126)
Anion gap: 7 (ref 5–15)
BUN: 20 mg/dL (ref 8–23)
CO2: 26 mmol/L (ref 22–32)
Calcium: 9.2 mg/dL (ref 8.9–10.3)
Chloride: 101 mmol/L (ref 98–111)
Creatinine, Ser: 1.33 mg/dL — ABNORMAL HIGH (ref 0.44–1.00)
GFR, Estimated: 42 mL/min — ABNORMAL LOW (ref >60)
Glucose, Bld: 109 mg/dL — ABNORMAL HIGH (ref 70–99)
Potassium: 4.8 mmol/L (ref 3.5–5.1)
Sodium: 134 mmol/L — ABNORMAL LOW (ref 135–145)
Total Bilirubin: 0.2 mg/dL — ABNORMAL LOW (ref 0.3–1.2)
Total Protein: 7.4 g/dL (ref 6.5–8.1)

## 2021-07-31 LAB — URINALYSIS, MICROSCOPIC (REFLEX)

## 2021-07-31 LAB — LIPASE, BLOOD: Lipase: 25 U/L (ref 11–51)

## 2021-07-31 MED ORDER — FENTANYL CITRATE PF 50 MCG/ML IJ SOSY
25.0000 ug | PREFILLED_SYRINGE | Freq: Once | INTRAMUSCULAR | Status: AC
  Administered 2021-07-31: 25 ug via INTRAVENOUS
  Filled 2021-07-31: qty 1

## 2021-07-31 MED ORDER — OXYCODONE-ACETAMINOPHEN 5-325 MG PO TABS
1.0000 | ORAL_TABLET | Freq: Once | ORAL | Status: AC
  Administered 2021-07-31: 1 via ORAL
  Filled 2021-07-31: qty 1

## 2021-07-31 NOTE — ED Triage Notes (Signed)
Pt arrived via EMS with complaints of right flank pain. Pt states hx of kidney stones. Denies frequency or dysuria.

## 2021-07-31 NOTE — Discharge Instructions (Addendum)
Your work-up today did not show any evidence of a urinary tract infection.  The staghorn calculus seen on your previous CT is within the kidney and not causing swelling of the ureter.  Your right-sided pain is possibly related to your back.  I recommend that your oxycodone 5 mg tablet be increased to every 4 as needed.  You will also need to take a stool softener daily as this medication can cause constipation.  Please follow-up with your primary care provider for recheck.  Return to the emergency department for any new or worsening symptoms.

## 2021-07-31 NOTE — ED Provider Notes (Signed)
[facility] EMERGENCY DEPARTMENT Provider Note   CSN: [id number] Arrival date & time: 07/31/21  1407     History Chief Complaint  Patient presents with   Flank Pain    SURENA WELGE is a 75 y.o. female.   Flank Pain Pertinent negatives include no chest pain, no abdominal pain and no shortness of breath.     CHANDELLE HARKEY is a 75 y.o. 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  Was here on 07/25/2021 for similar symptoms.  She takes oxycodone daily every 12 hours for her pain.  She states the medication is not helping control her flank pain.  She noticed documentation on CT from her 07/25/21 visit that she had a staghorn calculus of her right kidney.  She is concerned that this is the source of her current pain.  She describes her pain as constant, but worsening with certain movements.  Pain does not radiate into her leg.  No chest pain.  She also reports some "pink-tinged" urine recently.  She denies any other new or worsening symptoms.  Specially, she denies fever, chills, abdominal pain, nausea, vomiting, diarrhea or constipation, or other urinary complaints.    Past Medical History:  Diagnosis Date   Allergy    seasonal   Anemia    Arthritis    Asthma    uses Advair and Spiriva daily,Albuterol prn.Takes Singulair at bedtime and Prednisone daily   Back spasm    takes Zanaflex daily as needed   Blood transfusion 2007   no abnormal reation noted    Bruises easily    d/t being on Prednisone   Bursitis    left elbow   C. difficile colitis    2007: treated with Flagyl and Vanc, 2014: treated with vanc, Aug 2014: Vanc taper   Chronic back pain    Chronic kidney disease    "creatine" creeping up - followed at this time  by PCP   Complication of anesthesia 2000   woke up during one surgery- ovary surgery   COPD (chronic obstructive pulmonary disease) ([place])    Depression    takes Prozac daily   Dysphasia    Falls    H/O hiatal hernia    Hemorrhoids    History of bronchitis    last time a yr ago   History of colon polyps    History of kidney stones    History of kidney stones    Hyperlipidemia    hx of-was on meds but has been off x 1 1/2 yrs   Hypertension    takes Verapamil nightly   IBS (irritable bowel syndrome)    Insomnia    takes Benadryl as needed   Myocardial infarction [facility]) 2007   Takotsubo cardiomyopathy   Neurogenic bowel    Neuromuscular dysfunction of bladder, unspecified    Nocturia    Osteoporosis    takes Fosamax weekly   Pneumonia    MRSA pneumonia in 2007   Shortness of breath    with exertion daily   Stroke [facility])    "they say I has a small stroke".No deficits   Tingling    and pain in left leg   Urinary frequency     Patient Active Problem List   Diagnosis Date Noted  Prolapsed internal hemorrhoids, grade 3 04/04/2021   PAT (paroxysmal atrial tachycardia) ([place]) 02/23/2020   Renal stones 02/23/2020   Sinusitis 02/23/2020   Aftercare following right knee joint replacement surgery 12/01/2018   GERD (gastroesophageal reflux disease) 11/18/2018   CKD (chronic kidney disease) stage 3, GFR 30-59 ml/min (HCC) 11/17/2018   IBS (irritable bowel syndrome) 11/17/2018   Ileus ([place])    Clostridium difficile colitis    Paralytic ileus of large intestine ([place])    Pressure injury of skin 07/09/2018   Protein-calorie malnutrition, severe 07/09/2018   Pseudomembranous colitis    Megacolon due to infectious colitis 07/04/2018   AKI (acute kidney injury) ([place]) 07/04/2018   Hypokalemia 07/04/2018   Lumbar compression fracture ([place]) 02/23/2018   S/P cervical spinal fusion 08/27/2017   Depression 06/02/2017   Dysphagia 06/02/2017   Impaired mobility and ADLs 06/02/2017    Neurocognitive deficits 06/02/2017   Neurogenic bladder 06/02/2017   Neurogenic bowel 06/02/2017   Displaced fracture of first cervical vertebra ([place]) 05/05/2017   Fall 05/05/2017   Anemia 02/20/2017   History of colon polyps 02/20/2017   Chronic diarrhea 02/20/2017   Abnormal weight loss 02/20/2017   Carotid stenosis, symptomatic w/o infarct 01/01/2016   Carotid stenosis 11/27/2015   Ganglion cyst of wrist    Hematoma 01/23/2014   Lumbar foraminal stenosis 10/17/2013   Olecranon bursitis of left elbow 09/14/2013   Left elbow pain 09/14/2013   C. difficile diarrhea 06/20/2013   Loose stools 03/23/2013   TAKOTSUBO SYNDROME 05/04/2009   PALPITATIONS 05/04/2009   CHEST PAIN UNSPECIFIED 05/04/2009   Essential hypertension 04/24/2009   ASTHMA 04/24/2009   COPD (chronic obstructive pulmonary disease) ([place]) 04/24/2009   Steroid-dependent asthma 04/24/2009   MRSA (methicillin resistant staphylococcus aureus) pneumonia ([place]) 09/05/2005    Past Surgical History:  Procedure Laterality Date   APPENDECTOMY     BIOPSY  04/13/2017   Procedure: BIOPSY;  Surgeon: Daneil Dolin, MD;  Location: [facility];  Service: Endoscopy;;  right and left colon    BIOPSY  07/06/2018   Procedure: BIOPSY;  Surgeon: Danie Binder, MD;  Location: [facility];  Service: Endoscopy;;  Random Colon   CARPAL TUNNEL RELEASE Left    CARPAL TUNNEL RELEASE Right 09/21/2015   Procedure: CARPAL TUNNEL RELEASE;  Surgeon: Carole Civil, MD;  Location: [facility] ORS;  Service: Orthopedics;  Laterality: Right;   cataract surgery Bilateral    CHOLECYSTECTOMY     COLONOSCOPY  09/24/2006   RMR: Normal rectum, left-sided diverticula   COLONOSCOPY  06/2011   Dr. Henrene Pastor: tubular adenomas, moderative diverticulosis, internal hemorrhoids   COLONOSCOPY WITH PROPOFOL N/A 04/13/2017   Dr. Gala Romney: internal hemorrhoids, diverticulosis   CYSTOSCOPY     ENDARTERECTOMY Right 12/12/2015   Procedure: ENDARTERECTOMY RIGHT CAROTID,  RESECTION OF REDUNDANT RIGHT COMMON CAROTID ARTERY WITH PRIMARY REANASTOMOSIS;  Surgeon: Mal Misty, MD;  Location: [facility] OR;  Service: Vascular;  Laterality: Right;   ESOPHAGOGASTRODUODENOSCOPY  04/07/2006   WUJ:WJXBJY esophagus s/p placement of Bravo pH probe, some surgical changes but not typical of fundoplication wrap, may have slipped   FLEXIBLE SIGMOIDOSCOPY N/A 07/06/2018   Procedure: FLEXIBLE SIGMOIDOSCOPY WITH PROPOFOL;  Surgeon: Danie Binder, MD;  [facility]: [facility];  Service: Endoscopy;  Laterality: N/A;  needs to be done first before the other inpatient    GANGLION CYST EXCISION Right 09/21/2015   Procedure: RIGHT WRIST GANGLION CYST REMOVAL;  Surgeon: Carole Civil, MD;  Location: [facility] OR[facility];  Service: Orthopedics;  Laterality: Right;   hemorrhoidal banding     HERNIA REPAIR     umbilical   KNEE ARTHROSCOPY Right    KNEE SURGERY Right    KYPHOPLASTY N/A 02/23/2018   Procedure: KYPHOPLASTY LUMBAR FOUR;  Surgeon: Newman Pies, MD;  Location: [place];  Service: Neurosurgery;  Laterality: N/A;   LUMBAR LAMINECTOMY/DECOMPRESSION MICRODISCECTOMY Left 10/17/2013   Procedure: LUMBAR LAMINECTOMY/DECOMPRESSION MICRODISCECTOMY 1 LEVEL three/four;  Surgeon: Ophelia Charter, MD;  Location: [place] [facility] ORS;  Service: Neurosurgery;  Laterality: Left;   NECK SURGERY     fusion-    NISSEN FUNDOPLICATION     x 2   OLECRANON BURSECTOMY Left 01/11/2014   Procedure: OLECRANON BURSECTOMY;  Surgeon: Carole Civil, MD;  Location: [facility];  Service: Orthopedics;  Laterality: Left;   OOPHORECTOMY Right    PATCH ANGIOPLASTY Right 12/12/2015   Procedure: PATCH ANGIOPLASTY RIGHT CAROTID ARTERY USING HEMASHIELD PLATINUM FINESSE PATCH;  Surgeon: Mal Misty, MD;  Location: [place];  Service: Vascular;  Laterality: Right;   TONSILLECTOMY       OB History   No obstetric history on file.     Family History  Problem Relation Age of Onset   Heart disease Father    Colon cancer Neg Hx      Social History   Tobacco Use   Smoking status: Never   Smokeless tobacco: Never  Vaping Use   Vaping Use: Never used  Substance Use Topics   Alcohol use: Not Currently    Alcohol/week: 10.0 standard drinks    Types: 10 Cans of beer per week    Comment: couple of beers daily   Drug use: No    Home Medications Prior to Admission medications   Medication Sig Start Date End Date Taking? Authorizing Provider  acetaminophen (TYLENOL) 325 MG tablet Take 650 mg by mouth every 6 (six) hours as needed.     [provider]  albuterol (ACCUNEB) 0.63 MG/3ML nebulizer solution Take 1 ampule by nebulization every 6 (six) hours as needed for wheezing.    [provider]  amLODipine (NORVASC) 2.5 MG tablet Take 2.5 mg by mouth daily. 03/03/20   [provider]  cephALEXin (KEFLEX) 500 MG capsule Take 1 capsule (500 mg total) by mouth 2 (two) times daily. 07/25/21   Ripley Fraise, MD  Cholecalciferol (VITAMIN D3 SUPER STRENGTH) 50 MCG (2000 UT) TABS Take 1 tablet by mouth daily.     [provider]  diclofenac Sodium (VOLTAREN) 1 % GEL Apply 4 g topically every 8 (eight) hours as needed.    [provider]  dicyclomine (BENTYL) 10 MG capsule Take 1 capsule (10 mg total) by mouth 4 (four) times daily -  before meals and at bedtime. 04/04/21   Annitta Needs, NP  FLUoxetine (PROZAC) 40 MG capsule Take 40 mg by mouth daily.    [provider]  fluticasone furoate-vilanterol (BREO ELLIPTA) 100-25 MCG/INH AEPB Inhale 1 puff into the lungs daily. 11/11/19   [provider]  gabapentin (NEURONTIN) 100 MG capsule Take 100 mg by mouth daily. 03/16/20   [provider]  iron polysaccharides (NIFEREX) 150 MG capsule Take 1 capsule by mouth daily.    [provider]  lidocaine (LIDODERM) 5 % Place onto the skin.    [provider]  Melatonin 3 MG TABS Take 6 mg by mouth at bedtime.     [provider]  metoprolol  tartrate (LOPRESSOR) 25 MG tablet Take 1 tablet (25  mg total) by mouth 2 (two) times daily. 07/19/18   Cristal Deer, MD  mirtazapine (REMERON) 15 MG tablet Take 15 mg by mouth at bedtime.    [provider]  montelukast (SINGULAIR) 10 MG tablet Take 10 mg by mouth daily. Patient not taking: Reported on 04/04/2021    [provider]  ondansetron (ZOFRAN-ODT) 4 MG disintegrating tablet Take 4 mg by mouth every 4 (four) hours as needed for nausea or vomiting.     [provider]  oxyCODONE (OXY IR/ROXICODONE) 5 MG immediate release tablet Take 5 mg by mouth every 12 (twelve) hours as needed.    [provider]  predniSONE (DELTASONE) 10 MG tablet Take 5 mg by mouth daily with breakfast.     [provider]  SANTYL ointment Apply 1 application topically 3 (three) times daily. 12/06/20   [provider]  senna-docusate (SENOKOT-S) 8.6-50 MG tablet Take 1 tablet by mouth 2 (two) times daily.    [provider]  tiotropium (SPIRIVA) 18 MCG inhalation capsule Place 18 mcg into inhaler and inhale daily.     [provider]  vitamin C (ASCORBIC ACID) 500 MG tablet Take 500 mg by mouth daily.    [provider]    Allergies    Cardura [doxazosin mesylate], Other, Barbiturates, Levofloxacin, and Sulfa antibiotics  Review of Systems   Review of Systems  Constitutional:  Negative for chills, fatigue and fever.  Respiratory:  Negative for cough and shortness of breath.   Cardiovascular:  Negative for chest pain.  Gastrointestinal:  Negative for abdominal pain, diarrhea, nausea and vomiting.  Genitourinary:  Positive for flank pain. Negative for decreased urine volume, difficulty urinating, dysuria and hematuria.  Musculoskeletal:  Positive for back pain. Negative for arthralgias and myalgias.  Skin:  Negative for rash.  Neurological:  Negative for dizziness, weakness and numbness.  Hematological:  Does not bruise/bleed  easily.  All other systems reviewed and are negative.  Physical Exam Updated Vital Signs BP (!) 142/72 (BP Location: Right Arm)   Pulse (!) 56   Temp 98.1 F (36.7 C) (Oral)   Resp 16   Ht 5\' 3"  (1.6 m)   Wt 59.6 kg   SpO2 97%   BMI 23.26 kg/m   Physical Exam Vitals and nursing note reviewed.  Constitutional:      General: She is not in acute distress.    Appearance: Normal appearance.  HENT:     Mouth/Throat:     Mouth: Mucous membranes are moist.  Cardiovascular:     Rate and Rhythm: Normal rate and regular rhythm.     Pulses: Normal pulses.  Pulmonary:     Effort: Pulmonary effort is normal. No respiratory distress.  Abdominal:     Palpations: Abdomen is soft.     Tenderness: There is no abdominal tenderness. There is no right CVA tenderness or left CVA tenderness.  Musculoskeletal:        General: Tenderness present.     Right lower leg: No edema.     Left lower leg: No edema.     Comments: Ttp at the right lumbar paraspinal muscles.  No midline tenderness  Skin:    General: Skin is warm and dry.     Capillary Refill: Capillary refill takes less than 2 seconds.  Neurological:     General: No focal deficit present.     Mental Status: She is alert.     Sensory: No sensory deficit.     Motor:  No weakness.    ED Results / Procedures / Treatments   Labs (all labs ordered are listed, but only abnormal results are displayed) Labs Reviewed  URINALYSIS, ROUTINE W REFLEX MICROSCOPIC - Abnormal; Notable for the following components:      Result Value   Hgb urine dipstick TRACE (*)    Leukocytes,Ua MODERATE (*)    All other components within normal limits  COMPREHENSIVE METABOLIC PANEL - Abnormal; Notable for the following components:   Sodium 134 (*)    Glucose, Bld 109 (*)    Creatinine, Ser 1.33 (*)    Total Bilirubin 0.2 (*)    GFR, Estimated 42 (*)    All other components within normal limits  CBC WITH DIFFERENTIAL/PLATELET - Abnormal; Notable for the  following components:   RBC 3.44 (*)    Hemoglobin 10.8 (*)    HCT 33.2 (*)    All other components within normal limits  URINALYSIS, MICROSCOPIC (REFLEX) - Abnormal; Notable for the following components:   Bacteria, UA RARE (*)    All other components within normal limits  LIPASE, BLOOD    EKG None  Radiology No results found.  Procedures Procedures   Medications Ordered in ED Medications  fentaNYL (SUBLIMAZE) injection 25 mcg (has no administration in time range)    ED Course  I have reviewed the triage vital signs and the nursing notes.  Pertinent labs & imaging results that were available during my care of the patient were reviewed by me and considered in my medical decision making (see chart for details).    MDM Rules/Calculators/A&P                           Pt here with persisting right flank pain seen here on 11/17 with  similar sx's.  She has a large staghorn calculus in the right kidney w/o hydronephrosis.  She denies dysuria or hematuria.  No increasing pain or new sx's.  Takes oxycodone twice daily   Her kidney function is baseline, U/A today w/o evidence of infection.  Low clinical suspicion for emergent process.  I will have her increase her oxycodone to q4 hrs. She is agreeable to plan and appears appropriate for d/c back to facility.  Return precautions discussed.    Final Clinical Impression(s) / ED Diagnoses Final diagnoses:  None    Rx / DC Orders ED Discharge Orders     None        Kem Parkinson, PA-C 08/04/21 0901    Jeanell Sparrow, DO 08/04/21 1622

## 2021-07-31 NOTE — ED Notes (Signed)
Pt placed on bedpan, attempting to give urine sample now.

## 2021-07-31 NOTE — ED Notes (Signed)
Attempted to call multiple times with no success given report.

## 2021-08-12 NOTE — Progress Notes (Addendum)
   [place] Banding Note:   Jordan [name] is a 75 y.o. female presenting today for consideration of hemorrhoid banding. She is well-known to this practice. Hemorrhoid banding in 2014/2015 with overall good response. Recurrent symptomatic Grade 3 hemorrhoids now with bleeding, pressure, and soiling. Last colonoscopy 2019. Neutral banding at first visit in July 2022. Initally "overshot" the depth and will need to pursue proximally. She has fecal soiling, bleeding, and prolapse.    The patient presents with symptomatic grade 3 hemorrhoids, unresponsive to maximal medical therapy, requesting rubber band ligation of her hemorrhoidal disease. All risks, benefits, and alternative forms of therapy were described and informed consent was obtained.  The decision was made to band the left lateral internal hemorrhoid, and the [place] was used to perform band ligation without complication. Digital anorectal examination was then performed to assure proper positioning of the band, and to adjust the banded tissue as required. The patient was discharged home without pain or other issues. Dietary and behavioral recommendations were given and (if necessary prescriptions were given), along with follow-up instructions. The patient will return in followup and possible additional banding as required.  No complications were encountered and the patient tolerated the procedure well.   Annitta Needs, PhD, ANP-BC [facility]

## 2021-08-13 ENCOUNTER — Ambulatory Visit (INDEPENDENT_AMBULATORY_CARE_PROVIDER_SITE_OTHER): Payer: Medicare Other | Admitting: Urology

## 2021-08-13 ENCOUNTER — Other Ambulatory Visit: Payer: Self-pay

## 2021-08-13 ENCOUNTER — Encounter: Payer: Self-pay | Admitting: Urology

## 2021-08-13 ENCOUNTER — Ambulatory Visit (INDEPENDENT_AMBULATORY_CARE_PROVIDER_SITE_OTHER): Payer: Medicare Other | Admitting: Gastroenterology

## 2021-08-13 ENCOUNTER — Encounter: Payer: Self-pay | Admitting: Gastroenterology

## 2021-08-13 VITALS — BP 98/58 | HR 66 | Wt 102.0 lb

## 2021-08-13 VITALS — BP 101/69 | HR 80 | Temp 97.1°F | Ht 63.0 in | Wt 102.6 lb

## 2021-08-13 DIAGNOSIS — M546 Pain in thoracic spine: Secondary | ICD-10-CM | POA: Diagnosis not present

## 2021-08-13 DIAGNOSIS — N200 Calculus of kidney: Secondary | ICD-10-CM

## 2021-08-13 DIAGNOSIS — K642 Third degree hemorrhoids: Secondary | ICD-10-CM | POA: Diagnosis not present

## 2021-08-13 NOTE — Progress Notes (Signed)
Assessment: 1. Nephrolithiasis; right, partial staghorn   2. Right-sided thoracic back pain, unspecified chronicity     Plan: I reviewed the patient's records from her ER visits including lab results and imaging studies.  I personally reviewed the CT study from 07/25/2021 showing a large staghorn calculus involving the lower pole of the right kidney and extending to the right renal pelvis.  No significant obstruction was noted on this study. Given the patient's exam and the CT findings, I am not convinced that her right-sided back pain is secondary to the stone.  Given the stone size, I discussed treatment options with the only feasible option likely being percutaneous nephrolithotomy.  She does have significant surgical risk given her multiple medical problems.  Any surgical procedures would likely need to be done at [facility] long. Will schedule for CT abdomen and pelvis with contrast to evaluate for obstruction and renal function. We will make arrangements for her to one of my colleagues at [facility] to discuss PCNL following CT.  Chief Complaint:  Chief Complaint  Patient presents with   Nephrolithiasis     History of Present Illness:  Jordan [name] is a 75 y.o. year old female who is seen in consultation from Everlena Cooper, NP for evaluation of right staghorn calculus.  She reports a 2-week history of some right-sided back pain with radiation to the right upper abdomen.  This is worsened with movement and with certain positions such as lying down.  She also reports some decreased appetite and nausea.  No fevers or chills.  She has noted some remittent pink coloration to her urine over the past 2 months.  She has a prior history of kidney stones undergoing a ureteroscopic stone manipulation in the past.  CT imaging from 07/25/2021 showed a 5 cm staghorn calculus involving the lower pole of the right kidney and extending into the right renal pelvis, a 4 mm nonobstructing left  interpolar renal calculus, no significant hydronephrosis, and no ureteral calculi. She has not had any recent UTIs. She does have a history of renal insufficiency, her recent creatinine was 1.33. Urinalysis from 07/31/2021 showed 0-5 RBCs, 11-20 WBCs, and rare bacteria.   Past Medical History:  Past Medical History:  Diagnosis Date   Allergy    seasonal   Anemia    Arthritis    Asthma    uses Advair and Spiriva daily,Albuterol prn.Takes Singulair at bedtime and Prednisone daily   Back spasm    takes Zanaflex daily as needed   Blood transfusion 2007   no abnormal reation noted    Bruises easily    d/t being on Prednisone   Bursitis    left elbow   C. difficile colitis    2007: treated with Flagyl and Vanc, 2014: treated with vanc, Aug 2014: Vanc taper   Chronic back pain    Chronic kidney disease    "creatine" creeping up - followed at this time by PCP   Complication of anesthesia 2000   woke up during one surgery- ovary surgery   COPD (chronic obstructive pulmonary disease) ([place])    Depression    takes Prozac daily   Dysphasia    Falls    H/O hiatal hernia    Hemorrhoids    History of bronchitis    last time a yr ago   History of colon polyps    History of kidney stones    History of kidney stones    Hyperlipidemia    hx of-was  on meds but has been off x 1 1/2 yrs   Hypertension    takes Verapamil nightly   IBS (irritable bowel syndrome)    Insomnia    takes Benadryl as needed   Myocardial infarction [facility]) 2007   Takotsubo cardiomyopathy   Neurogenic bowel    Neuromuscular dysfunction of bladder, unspecified    Nocturia    Osteoporosis    takes Fosamax weekly   Pneumonia    MRSA pneumonia in 2007   Shortness of breath    with exertion daily   Stroke [facility])    "they say I has a small stroke".No deficits   Tingling    and pain in left leg   Urinary frequency     Past Surgical History:  Past Surgical History:  Procedure Laterality Date   APPENDECTOMY      BIOPSY  04/13/2017   Procedure: BIOPSY;  Surgeon: Daneil Dolin, MD;  [facility]: [facility];  Service: Endoscopy;;  right and left colon    BIOPSY  07/06/2018   Procedure: BIOPSY;  Surgeon: Danie Binder, MD;  [facility];  Service: Endoscopy;;  Random Colon   CARPAL TUNNEL RELEASE Left    CARPAL TUNNEL RELEASE Right 09/21/2015   Procedure: CARPAL TUNNEL RELEASE;  Surgeon: Carole Civil, MD;  Location: [facility];  Service: Orthopedics;  Laterality: Right;   cataract surgery Bilateral    CHOLECYSTECTOMY     COLONOSCOPY  09/24/2006   RMR: Normal rectum, left-sided diverticula   COLONOSCOPY  06/2011   Dr. Henrene Pastor: tubular adenomas, moderative diverticulosis, internal hemorrhoids   COLONOSCOPY WITH PROPOFOL N/A 04/13/2017   Dr. Gala Romney: internal hemorrhoids, diverticulosis   CYSTOSCOPY     ENDARTERECTOMY Right 12/12/2015   Procedure: ENDARTERECTOMY RIGHT CAROTID, RESECTION OF REDUNDANT RIGHT COMMON CAROTID ARTERY WITH PRIMARY REANASTOMOSIS;  Surgeon: Mal Misty, MD;  Location: [facility] OR;  Service: Vascular;  Laterality: Right;   ESOPHAGOGASTRODUODENOSCOPY  04/07/2006   JME:QASTMH esophagus s/p placement of Bravo pH probe, some surgical changes but not typical of fundoplication wrap, may have slipped   FLEXIBLE SIGMOIDOSCOPY N/A 07/06/2018   Procedure: FLEXIBLE SIGMOIDOSCOPY WITH PROPOFOL;  Surgeon: Danie Binder, MD;  [facility];  Service: Endoscopy;  Laterality: N/A;  needs to be done first before the other inpatient    GANGLION CYST EXCISION Right 09/21/2015   Procedure: RIGHT WRIST GANGLION CYST REMOVAL;  Surgeon: Carole Civil, MD;  Location: [facility];  Service: Orthopedics;  Laterality: Right;   hemorrhoidal banding     HERNIA REPAIR     umbilical   KNEE ARTHROSCOPY Right    KNEE SURGERY Right    KYPHOPLASTY N/A 02/23/2018   Procedure: KYPHOPLASTY LUMBAR FOUR;  Surgeon: Newman Pies, MD;  Location: [place];  Service: Neurosurgery;  Laterality:  N/A;   LUMBAR LAMINECTOMY/DECOMPRESSION MICRODISCECTOMY Left 10/17/2013   Procedure: LUMBAR LAMINECTOMY/DECOMPRESSION MICRODISCECTOMY 1 LEVEL three/four;  Surgeon: Ophelia Charter, MD;  Location: [facility];  Service: Neurosurgery;  Laterality: Left;   NECK SURGERY     fusion-    NISSEN FUNDOPLICATION     x 2   OLECRANON BURSECTOMY Left 01/11/2014   Procedure: OLECRANON BURSECTOMY;  Surgeon: Carole Civil, MD;  Location: [facility];  Service: Orthopedics;  Laterality: Left;   OOPHORECTOMY Right    PATCH ANGIOPLASTY Right 12/12/2015   Procedure: PATCH ANGIOPLASTY RIGHT CAROTID ARTERY USING Cable;  Surgeon: Mal Misty, MD;  Location: [place];  Service: Vascular;  Laterality: Right;   TONSILLECTOMY      Allergies:  Allergies  Allergen Reactions   Cardura [Doxazosin Mesylate] Hives   Cardura [Doxazosin]    Other     Strawberry-hives   Barbiturates Nausea And Vomiting and Rash   Levofloxacin Rash   Sulfa Antibiotics Nausea Only and Rash    Family History:  Family History  Problem Relation Age of Onset   Heart disease Father    Colon cancer Neg Hx     Social History:  Social History   Tobacco Use   Smoking status: Never   Smokeless tobacco: Never  Vaping Use   Vaping Use: Never used  Substance Use Topics   Alcohol use: Not Currently    Alcohol/week: 10.0 standard drinks    Types: 10 Cans of beer per week    Comment: couple of beers daily   Drug use: No    Review of symptoms:  Constitutional:  Negative for unexplained weight loss, night sweats, fever, chills ENT:  Negative for nose bleeds, sinus pain, painful swallowing CV:  Negative for chest pain, shortness of breath, exercise intolerance, palpitations, loss of consciousness Resp:  Negative for cough, wheezing, shortness of breath GI:  Negative for nausea, vomiting, diarrhea, bloody stools GU:  Positives noted in HPI; otherwise negative for dysuria, urinary incontinence Neuro:   Negative for seizures, poor balance, limb weakness, slurred speech Psych:  Negative for lack of energy, depression, anxiety Endocrine:  Negative for polydipsia, polyuria, symptoms of hypoglycemia (dizziness, hunger, sweating) Hematologic:  Negative for anemia, purpura, petechia, prolonged or excessive bleeding, use of anticoagulants  Allergic:  Negative for difficulty breathing or choking as a result of exposure to anything; no shellfish allergy; no allergic response (rash/itch) to materials, foods  Physical exam: BP (!) 98/58   Pulse 66   Wt 102 lb (46.3 kg)   BMI 18.07 kg/m  GENERAL APPEARANCE:  Well appearing, well developed, well nourished, NAD HEENT: Atraumatic, Normocephalic, oropharynx clear. NECK: Supple without lymphadenopathy or thyromegaly. LUNGS: Clear to auscultation bilaterally. HEART: Regular Rate and Rhythm without murmurs, gallops, or rubs. ABDOMEN: Soft, non-tender, No Masses. EXTREMITIES: Moves all extremities well.  Without clubbing, cyanosis, or edema. NEUROLOGIC:  Alert and oriented x 3, in wheelchair, CN II-XII grossly intact.  MENTAL STATUS:  Appropriate. BACK:  Tender to palpation over right paraspinal area and lower right rib cage SKIN:  Warm, dry and intact.    Results: No specimen provided

## 2021-08-13 NOTE — Progress Notes (Signed)
Urological Symptom Review  Patient is experiencing the following symptoms: Hard to postpone urination Get up at night to urinate Leakage of urine   Review of Systems  Gastrointestinal (upper)  : Negative for upper GI symptoms  Gastrointestinal (lower) : Diarrhea  Constitutional : Fatigue  Skin: Negative for skin symptoms  Eyes: Blurred vision  Ear/Nose/Throat : Negative for Ear/Nose/Throat symptoms  Hematologic/Lymphatic: Easy bruising  Cardiovascular : Negative for cardiovascular symptoms  Respiratory : Shortness of breath  Endocrine: Excessive thirst  Musculoskeletal: Back pain  Neurological: Negative for neurological symptoms  Psychologic: Negative for psychiatric symptoms

## 2021-08-13 NOTE — Patient Instructions (Signed)
We will see you back in follow-up for repeat banding!  Have a wonderful birthday and Christmas!   I enjoyed seeing you again today! As you know, I value our relationship and want to provide genuine, compassionate, and quality care. I welcome your feedback. If you receive a survey regarding your visit,  I greatly appreciate you taking time to fill this out. See you next time!  Annitta Needs, PhD, ANP-BC [facility]

## 2021-08-20 ENCOUNTER — Ambulatory Visit: Payer: Medicare Other | Admitting: Urology

## 2021-09-07 ENCOUNTER — Encounter (HOSPITAL_COMMUNITY): Payer: Self-pay

## 2021-09-07 ENCOUNTER — Emergency Department (HOSPITAL_COMMUNITY)
Admission: EM | Admit: 2021-09-07 | Discharge: 2021-09-08 | Disposition: A | Discharge status: Home / Self Care | Payer: Medicare Other | Attending: Emergency Medicine | Admitting: Emergency Medicine

## 2021-09-07 ENCOUNTER — Other Ambulatory Visit: Payer: Self-pay

## 2021-09-07 ENCOUNTER — Emergency Department (HOSPITAL_COMMUNITY): Payer: Medicare Other

## 2021-09-07 DIAGNOSIS — M545 Low back pain, unspecified: Secondary | ICD-10-CM | POA: Insufficient documentation

## 2021-09-07 DIAGNOSIS — G8929 Other chronic pain: Secondary | ICD-10-CM | POA: Insufficient documentation

## 2021-09-07 DIAGNOSIS — I129 Hypertensive chronic kidney disease with stage 1 through stage 4 chronic kidney disease, or unspecified chronic kidney disease: Secondary | ICD-10-CM | POA: Insufficient documentation

## 2021-09-07 DIAGNOSIS — J45909 Unspecified asthma, uncomplicated: Secondary | ICD-10-CM | POA: Insufficient documentation

## 2021-09-07 DIAGNOSIS — J449 Chronic obstructive pulmonary disease, unspecified: Secondary | ICD-10-CM | POA: Insufficient documentation

## 2021-09-07 DIAGNOSIS — N3000 Acute cystitis without hematuria: Secondary | ICD-10-CM | POA: Diagnosis not present

## 2021-09-07 DIAGNOSIS — Z79899 Other long term (current) drug therapy: Secondary | ICD-10-CM | POA: Insufficient documentation

## 2021-09-07 DIAGNOSIS — N183 Chronic kidney disease, stage 3 unspecified: Secondary | ICD-10-CM | POA: Diagnosis not present

## 2021-09-07 LAB — URINALYSIS, ROUTINE W REFLEX MICROSCOPIC
Bilirubin Urine: NEGATIVE
Glucose, UA: NEGATIVE mg/dL
Ketones, ur: NEGATIVE mg/dL
Nitrite: NEGATIVE
Protein, ur: 30 mg/dL — AB
Specific Gravity, Urine: 1.026 (ref 1.005–1.030)
WBC, UA: >50 WBC/hpf — ABNORMAL HIGH (ref 0–5)
pH: 7 (ref 5.0–8.0)

## 2021-09-07 LAB — BASIC METABOLIC PANEL
Anion gap: 7 (ref 5–15)
BUN: 22 mg/dL (ref 8–23)
CO2: 24 mmol/L (ref 22–32)
Calcium: 8.5 mg/dL — ABNORMAL LOW (ref 8.9–10.3)
Chloride: 106 mmol/L (ref 98–111)
Creatinine, Ser: 1.52 mg/dL — ABNORMAL HIGH (ref 0.44–1.00)
GFR, Estimated: 35 mL/min — ABNORMAL LOW (ref >60)
Glucose, Bld: 89 mg/dL (ref 70–99)
Potassium: 4.3 mmol/L (ref 3.5–5.1)
Sodium: 137 mmol/L (ref 135–145)

## 2021-09-07 LAB — HEPATIC FUNCTION PANEL
ALT: 12 U/L (ref 0–44)
AST: 17 U/L (ref 15–41)
Albumin: 3 g/dL — ABNORMAL LOW (ref 3.5–5.0)
Alkaline Phosphatase: 90 U/L (ref 38–126)
Bilirubin, Direct: <0.1 mg/dL (ref 0.0–0.2)
Total Bilirubin: 0.4 mg/dL (ref 0.3–1.2)
Total Protein: 6 g/dL — ABNORMAL LOW (ref 6.5–8.1)

## 2021-09-07 LAB — CBC WITH DIFFERENTIAL/PLATELET
Abs Immature Granulocytes: 0.03 10*3/uL (ref 0.00–0.07)
Basophils Absolute: 0.1 10*3/uL (ref 0.0–0.1)
Basophils Relative: 1 %
Eosinophils Absolute: 0.4 10*3/uL (ref 0.0–0.5)
Eosinophils Relative: 6 %
HCT: 27.2 % — ABNORMAL LOW (ref 36.0–46.0)
Hemoglobin: 8.9 g/dL — ABNORMAL LOW (ref 12.0–15.0)
Immature Granulocytes: 0 %
Lymphocytes Relative: 16 %
Lymphs Abs: 1.1 10*3/uL (ref 0.7–4.0)
MCH: 31.8 pg (ref 26.0–34.0)
MCHC: 32.7 g/dL (ref 30.0–36.0)
MCV: 97.1 fL (ref 80.0–100.0)
Monocytes Absolute: 0.7 10*3/uL (ref 0.1–1.0)
Monocytes Relative: 11 %
Neutro Abs: 4.5 10*3/uL (ref 1.7–7.7)
Neutrophils Relative %: 66 %
Platelets: 239 10*3/uL (ref 150–400)
RBC: 2.8 MIL/uL — ABNORMAL LOW (ref 3.87–5.11)
RDW: 12.9 % (ref 11.5–15.5)
WBC: 6.8 10*3/uL (ref 4.0–10.5)
nRBC: 0 % (ref 0.0–0.2)

## 2021-09-07 LAB — LIPASE, BLOOD: Lipase: 25 U/L (ref 11–51)

## 2021-09-07 MED ORDER — HYDROMORPHONE HCL 1 MG/ML IJ SOLN
0.5000 mg | Freq: Once | INTRAMUSCULAR | Status: AC
  Administered 2021-09-07: 0.5 mg via INTRAVENOUS
  Filled 2021-09-07: qty 1

## 2021-09-07 MED ORDER — IOHEXOL 350 MG/ML SOLN
80.0000 mL | Freq: Once | INTRAVENOUS | Status: AC | PRN
  Administered 2021-09-07: 80 mL via INTRAVENOUS

## 2021-09-07 MED ORDER — HYDROMORPHONE HCL 1 MG/ML IJ SOLN
0.2500 mg | Freq: Once | INTRAMUSCULAR | Status: AC
  Administered 2021-09-07: 0.25 mg via INTRAVENOUS
  Filled 2021-09-07: qty 1

## 2021-09-07 MED ORDER — SODIUM CHLORIDE 0.9 % IV BOLUS
1000.0000 mL | Freq: Once | INTRAVENOUS | Status: AC
  Administered 2021-09-07: 1000 mL via INTRAVENOUS

## 2021-09-07 MED ORDER — CEPHALEXIN 250 MG PO CAPS: 250.0000 mg | ORAL_CAPSULE | Freq: Every day | ORAL | 0 refills | Status: AC

## 2021-09-07 NOTE — ED Provider Notes (Signed)
[facility] Provider Note   CSN: [id number] Arrival date & time: 09/07/21  1144     History Chief Complaint  Patient presents with   Back Pain    Jordan [name] is a 75 y.o. female.  HPI  Patient with medical history including asthma, CKD, COPD, kidney stones chronic back pain presents with chief complaint of right-sided right upper back pain.  Patient states it has been going on for about a month's time but over the last couple of days pain has gotten immensely worse, patient has pain remains just in her back, does not radiate, it is worsened with movements improved slightly with rest, she denies  paresthesias or weakness in lower extremities, denies saddle paresthesias, urinary incontinency, retention, difficult bowel movements, she has no urinary symptoms, she denies any recent trauma, no associated fevers, chills, chest pain, shortness of breath, peripheral edema.  Patient no history of IV drug use, no history of AAA, dissections or Murfin disease.  At reviewing patient's chart patient been seen here in the past for same complaint, CT ab pelvis contrast was obtained shows old L4 compression fracture as well as T12 compression fracture, there is also noted a 4 mm nonobstructing left renal interpolar calculus.  Patient was recently evaluated by urology who had reviewed patient's imaging did not feel this is the cause of patient's pain but due to the size of it they do recommend removing the stone.  Past Medical History:  Diagnosis Date   Allergy    seasonal   Anemia    Arthritis    Asthma    uses Advair and Spiriva daily,Albuterol prn.Takes Singulair at bedtime and Prednisone daily   Back spasm    takes Zanaflex daily as needed   Blood transfusion 2007   no abnormal reation noted    Bruises easily    d/t being on Prednisone   Bursitis    left elbow   C. difficile colitis    2007: treated with Flagyl and Vanc, 2014: treated with vanc, Aug 2014: Vanc taper    Chronic back pain    Chronic kidney disease    "creatine" creeping up - followed at this time by PCP   Complication of anesthesia 2000   woke up during one surgery- ovary surgery   COPD (chronic obstructive pulmonary disease) ([place])    Depression    takes Prozac daily   Dysphasia    Falls    H/O hiatal hernia    Hemorrhoids    History of bronchitis    last time a yr ago   History of colon polyps    History of kidney stones    History of kidney stones    Hyperlipidemia    hx of-was on meds but has been off x 1 1/2 yrs   Hypertension    takes Verapamil nightly   IBS (irritable bowel syndrome)    Insomnia    takes Benadryl as needed   Myocardial infarction [facility]) 2007   Takotsubo cardiomyopathy   Neurogenic bowel    Neuromuscular dysfunction of bladder, unspecified    Nocturia    Osteoporosis    takes Fosamax weekly   Pneumonia    MRSA pneumonia in 2007   Shortness of breath    with exertion daily   Stroke [facility])    "they say I has a small stroke".No deficits   Tingling    and pain in left leg   Urinary frequency     Patient Active  Problem List   Diagnosis Date Noted   Prolapsed internal hemorrhoids, grade 3 04/04/2021   PAT (paroxysmal atrial tachycardia) ([place]) 02/23/2020   Nephrolithiasis 02/23/2020   Sinusitis 02/23/2020   Aftercare following right knee joint replacement surgery 12/01/2018   GERD (gastroesophageal reflux disease) 11/18/2018   CKD (chronic kidney disease) stage 3, GFR 30-59 ml/min (HCC) 11/17/2018   IBS (irritable bowel syndrome) 11/17/2018   Ileus ([place])    Clostridium difficile colitis    Paralytic ileus of large intestine ([place])    Pressure injury of skin 07/09/2018   Protein-calorie malnutrition, severe 07/09/2018   Pseudomembranous colitis    Megacolon due to infectious colitis 07/04/2018   AKI (acute kidney injury) ([place]) 07/04/2018   Hypokalemia 07/04/2018   Lumbar compression fracture ([place]) 02/23/2018   S/P cervical spinal fusion  08/27/2017   Depression 06/02/2017   Dysphagia 06/02/2017   Impaired mobility and ADLs 06/02/2017   Neurocognitive deficits 06/02/2017   Neurogenic bladder 06/02/2017   Neurogenic bowel 06/02/2017   Displaced fracture of first cervical vertebra ([place]) 05/05/2017   Fall 05/05/2017   Anemia 02/20/2017   History of colon polyps 02/20/2017   Chronic diarrhea 02/20/2017   Abnormal weight loss 02/20/2017   Carotid stenosis, symptomatic w/o infarct 01/01/2016   Carotid stenosis 11/27/2015   Ganglion cyst of wrist    Hematoma 01/23/2014   Lumbar foraminal stenosis 10/17/2013   Olecranon bursitis of left elbow 09/14/2013   Left elbow pain 09/14/2013   C. difficile diarrhea 06/20/2013   Loose stools 03/23/2013   TAKOTSUBO SYNDROME 05/04/2009   PALPITATIONS 05/04/2009   Right-sided thoracic back pain 05/04/2009   Essential hypertension 04/24/2009   ASTHMA 04/24/2009   COPD (chronic obstructive pulmonary disease) ([place]) 04/24/2009   Steroid-dependent asthma 04/24/2009   MRSA (methicillin resistant staphylococcus aureus) pneumonia ([place]) 09/05/2005    Past Surgical History:  Procedure Laterality Date   APPENDECTOMY     BIOPSY  04/13/2017   Procedure: BIOPSY;  Surgeon: Daneil Dolin, MD;  [facility];  Service: Endoscopy;;  right and left colon    BIOPSY  07/06/2018   Procedure: BIOPSY;  Surgeon: Danie Binder, MD;  [facility];  Service: Endoscopy;;  Random Colon   CARPAL TUNNEL RELEASE Left    CARPAL TUNNEL RELEASE Right 09/21/2015   Procedure: CARPAL TUNNEL RELEASE;  Surgeon: Carole Civil, MD;  Location: [facility];  Service: Orthopedics;  Laterality: Right;   cataract surgery Bilateral    CHOLECYSTECTOMY     COLONOSCOPY  09/24/2006   RMR: Normal rectum, left-sided diverticula   COLONOSCOPY  06/2011   Dr. Henrene Pastor: tubular adenomas, moderative diverticulosis, internal hemorrhoids   COLONOSCOPY WITH PROPOFOL N/A 04/13/2017   Dr. Gala Romney: internal  hemorrhoids, diverticulosis   CYSTOSCOPY     ENDARTERECTOMY Right 12/12/2015   Procedure: ENDARTERECTOMY RIGHT CAROTID, RESECTION OF REDUNDANT RIGHT COMMON CAROTID ARTERY WITH PRIMARY REANASTOMOSIS;  Surgeon: Mal Misty, MD;  Location: [facility] OR;  Service: Vascular;  Laterality: Right;   ESOPHAGOGASTRODUODENOSCOPY  04/07/2006   TKZ:SWFUXN esophagus s/p placement of Bravo pH probe, some surgical changes but not typical of fundoplication wrap, may have slipped   FLEXIBLE SIGMOIDOSCOPY N/A 07/06/2018   Procedure: FLEXIBLE SIGMOIDOSCOPY WITH PROPOFOL;  Surgeon: Danie Binder, MD;  [facility];  Service: Endoscopy;  Laterality: N/A;  needs to be done first before the other inpatient    GANGLION CYST EXCISION Right 09/21/2015   Procedure: RIGHT WRIST GANGLION CYST REMOVAL;  Surgeon: Carole Civil,  MD;  Location: [facility];  Service: Orthopedics;  Laterality: Right;   hemorrhoidal banding     HERNIA REPAIR     umbilical   KNEE ARTHROSCOPY Right    KNEE SURGERY Right    KYPHOPLASTY N/A 02/23/2018   Procedure: KYPHOPLASTY LUMBAR FOUR;  Surgeon: Newman Pies, MD;  Location: [facility];  Service: Neurosurgery;  Laterality: N/A;   LUMBAR LAMINECTOMY/DECOMPRESSION MICRODISCECTOMY Left 10/17/2013   Procedure: LUMBAR LAMINECTOMY/DECOMPRESSION MICRODISCECTOMY 1 LEVEL three/four;  Surgeon: Ophelia Charter, MD;  Location: [facility];  Service: Neurosurgery;  Laterality: Left;   NECK SURGERY     fusion-    NISSEN FUNDOPLICATION     x 2   OLECRANON BURSECTOMY Left 01/11/2014   Procedure: OLECRANON BURSECTOMY;  Surgeon: Carole Civil, MD;  Location: [facility];  Service: Orthopedics;  Laterality: Left;   OOPHORECTOMY Right    PATCH ANGIOPLASTY Right 12/12/2015   Procedure: PATCH ANGIOPLASTY RIGHT CAROTID ARTERY USING HEMASHIELD PLATINUM FINESSE PATCH;  Surgeon: Mal Misty, MD;  Location: [facility];  Service: Vascular;  Laterality: Right;   TONSILLECTOMY       OB History   No obstetric  history on file.     Family History  Problem Relation Age of Onset   Heart disease Father    Colon cancer Neg Hx     Social History   Tobacco Use   Smoking status: Never   Smokeless tobacco: Never  Vaping Use   Vaping Use: Never used  Substance Use Topics   Alcohol use: Not Currently    Alcohol/week: 10.0 standard drinks    Types: 10 Cans of beer per week    Comment: couple of beers daily   Drug use: No    Home Medications Prior to Admission medications   Medication Sig Start Date End Date Taking? Authorizing Provider  cephALEXin (KEFLEX) 250 MG capsule Take 1 capsule (250 mg total) by mouth daily for 7 doses. 09/07/21 09/14/21 Yes Marcello Fennel, PA-C  acetaminophen (TYLENOL) 325 MG tablet Take 650 mg by mouth every 6 (six) hours as needed.     [provider]  albuterol (ACCUNEB) 0.63 MG/3ML nebulizer solution Take 1 ampule by nebulization every 6 (six) hours as needed for wheezing.    [provider]  albuterol (VENTOLIN HFA) 108 (90 Base) MCG/ACT inhaler Inhale into the lungs every 6 (six) hours as needed for wheezing or shortness of breath.    [provider]  amLODipine (NORVASC) 2.5 MG tablet Take 2.5 mg by mouth daily. 03/03/20   [provider]  dicyclomine (BENTYL) 10 MG capsule Take 1 capsule (10 mg total) by mouth 4 (four) times daily -  before meals and at bedtime. Patient taking differently: Take 10 mg by mouth in the morning, at noon, and at bedtime. 04/04/21   Annitta Needs, NP  dicyclomine (BENTYL) 20 MG tablet Take 20 mg by mouth every 6 (six) hours. As needed    [provider]  FLUoxetine (PROZAC) 40 MG capsule Take 40 mg by mouth daily.    [provider]  fluticasone furoate-vilanterol (BREO ELLIPTA) 100-25 MCG/INH AEPB Inhale 1 puff into the lungs daily. 11/11/19   [provider]  gabapentin (NEURONTIN) 100 MG capsule Take 100 mg by mouth daily. 03/16/20   [provider]  iron  polysaccharides (NIFEREX) 150 MG capsule Take 1 capsule by mouth daily.    [provider]  lidocaine (LIDODERM) 5 % Place onto the skin.  [provider]  Melatonin 3 MG TABS Take 6 mg by mouth at bedtime.     [provider]  metoprolol tartrate (LOPRESSOR) 25 MG tablet Take 1 tablet (25 mg total) by mouth 2 (two) times daily. 07/19/18   Cristal Deer, MD  montelukast (SINGULAIR) 10 MG tablet Take 10 mg by mouth daily.    [provider]  ondansetron (ZOFRAN-ODT) 4 MG disintegrating tablet Take 4 mg by mouth every 6 (six) hours as needed for nausea or vomiting.    [provider]  oxyCODONE (OXY IR/ROXICODONE) 5 MG immediate release tablet Take 5 mg by mouth every 4 (four) hours as needed.    [provider]  predniSONE (DELTASONE) 10 MG tablet Take 5 mg by mouth daily with breakfast.     [provider]  senna-docusate (SENOKOT-S) 8.6-50 MG tablet Take 1 tablet by mouth 2 (two) times daily.    [provider]  umeclidinium bromide (INCRUSE ELLIPTA) 62.5 MCG/ACT AEPB Inhale 1 puff into the lungs daily.    [provider]  vitamin C (ASCORBIC ACID) 500 MG tablet Take 500 mg by mouth daily.    [provider]    Allergies    Cardura [doxazosin mesylate], Cardura [doxazosin], Barbiturates, Levofloxacin, and Sulfa antibiotics  Review of Systems   Review of Systems  Constitutional:  Negative for chills and fever.  HENT:  Negative for congestion.   Respiratory:  Negative for shortness of breath.   Cardiovascular:  Negative for chest pain.  Gastrointestinal:  Negative for abdominal pain.  Genitourinary:  Negative for difficulty urinating, dyspareunia, enuresis and hematuria.  Musculoskeletal:  Positive for back pain.  Skin:  Negative for rash.  Neurological:  Negative for dizziness.  Hematological:  Does not bruise/bleed easily.   Physical Exam Updated Vital Signs BP (!) 110/59    Pulse 73    Temp  98.2 F (36.8 C)    Resp 20    Ht 5\' 3"  (1.6 m)    Wt 49.9 kg    SpO2 (!) 85%    BMI 19.49 kg/m   Physical Exam Vitals and nursing note reviewed.  Constitutional:      General: She is not in acute distress.    Appearance: She is not ill-appearing.  HENT:     Head: Normocephalic and atraumatic.     Nose: No congestion.  Eyes:     Conjunctiva/sclera: Conjunctivae normal.  Cardiovascular:     Rate and Rhythm: Normal rate and regular rhythm.     Pulses: Normal pulses.     Heart sounds: No murmur heard.   No friction rub. No gallop.  Pulmonary:     Effort: No respiratory distress.     Breath sounds: No wheezing, rhonchi or rales.  Abdominal:     Palpations: Abdomen is soft.     Tenderness: There is no abdominal tenderness. There is right CVA tenderness. There is no left CVA tenderness.     Comments: Abdomen nondistended, normal bowel sounds, dull to percussion, slight tenderness to palpation in her right upper quadrant, no guarding, rebound times, peritoneal sign, negative Murphy sign McBurney point she does have positive right-sided CVA tenderness.  Musculoskeletal:     Right lower leg: No edema.     Left lower leg: No edema.     Comments: Spine was visualized there is no overlying skin changes, spine was palpated was nontender to palpation, no step-off or deformities noted, she is full range of motion lower extremities, 5 of  5 strength, neurovascular intact, she had positive straight leg raise with the right leg.  Skin:    General: Skin is warm and dry.  Neurological:     Mental Status: She is alert.  Psychiatric:        Mood and Affect: Mood normal.    ED Results / Procedures / Treatments   Labs (all labs ordered are listed, but only abnormal results are displayed) Labs Reviewed  BASIC METABOLIC PANEL - Abnormal; Notable for the following components:      Result Value   Creatinine, Ser 1.52 (*)    Calcium 8.5 (*)    GFR, Estimated 35 (*)    All other components within  normal limits  CBC WITH DIFFERENTIAL/PLATELET - Abnormal; Notable for the following components:   RBC 2.80 (*)    Hemoglobin 8.9 (*)    HCT 27.2 (*)    All other components within normal limits  URINALYSIS, ROUTINE W REFLEX MICROSCOPIC - Abnormal; Notable for the following components:   APPearance CLOUDY (*)    Hgb urine dipstick MODERATE (*)    Protein, ur 30 (*)    Leukocytes,Ua LARGE (*)    WBC, UA >50 (*)    Bacteria, UA FEW (*)    All other components within normal limits  HEPATIC FUNCTION PANEL - Abnormal; Notable for the following components:   Total Protein 6.0 (*)    Albumin 3.0 (*)    All other components within normal limits  URINE CULTURE  LIPASE, BLOOD    EKG None  Radiology CT Angio Chest/Abd/Pel for Dissection W and/or Wo Contrast  Result Date: 09/07/2021 CLINICAL DATA:  Back pain. Suspected aortic aneurysm. Evaluate for dissection. EXAM: CT ANGIOGRAPHY CHEST, ABDOMEN AND PELVIS TECHNIQUE: Non-contrast CT of the chest was initially obtained. Multidetector CT imaging through the chest, abdomen and pelvis was performed using the standard protocol during bolus administration of intravenous contrast. Multiplanar reconstructed images and MIPs were obtained and reviewed to evaluate the vascular anatomy. CONTRAST:  77mL OMNIPAQUE IOHEXOL 350 MG/ML SOLN COMPARISON:  CT abdomen and pelvis 07/25/2021 FINDINGS: CTA CHEST FINDINGS Cardiovascular: Ascending thoracic aorta measures 3.5 cm. Atherosclerotic calcifications involving the thoracic aorta without aneurysm or dissection. Typical three-vessel arch anatomy and the great vessels are patent. Atherosclerotic calcifications involving the left subclavian artery without significant stenosis. Heart size is within normal limits. No significant pericardial effusion. Mediastinum/Nodes: Normal appearance of the mediastinal structures without lymph node enlargement. The esophagus is unremarkable. Lungs/Pleura: Trachea and mainstem bronchi  are patent. Mild scarring at both lung apices. Scattered pleural-based linear and reticular densities in the upper lungs are suggestive for areas of scarring or mild fibrotic changes. Trace right pleural fluid. Questionable trace left pleural fluid. Small amount of consolidation at both lung bases and likely represents atelectasis. Musculoskeletal: Joint space narrowing and degenerative changes in both shoulders, severe on the left side. There is an old left eighth rib fracture. Vertebral body compression deformities at T10 and T12. T12 fracture is chronic. T10 fracture is age indeterminate based on the previous imaging. Review of the MIP images confirms the above findings. CTA ABDOMEN AND PELVIS FINDINGS VASCULAR Aorta: Atherosclerotic calcifications in the abdominal aorta without aneurysm, dissection or significant stenosis. Abdominal aorta is tortuous. Celiac: Patent without aneurysm, dissection or significant stenosis. SMA: Patent without evidence of aneurysm, dissection, vasculitis or significant stenosis. Renals: Calcified plaque and suspect at least moderate stenosis near the origin of the right renal artery. Calcified plaque and at least mild stenosis near the origin  of the left renal artery. IMA: Patent Inflow: Bilateral iliac arteries are patent without significant stenosis and no evidence for aneurysm or dissection. Veins: No obvious venous abnormality within the limitations of this arterial phase study. Review of the MIP images confirms the above findings. NON-VASCULAR Hepatobiliary: Cholecystectomy. No acute abnormality in the liver. Common bile duct measures up to 1.2 cm. Common bile duct was difficult to visualized on the previous examination without contrast. Common bile duct measured approximately 1.0 cm on 04/27/2017. No clear evidence for filling defect or stone in the biliary system. Pancreas: Unremarkable. No pancreatic ductal dilatation or surrounding inflammatory changes. Spleen: Normal in  size without focal abnormality. Adrenals/Urinary Tract: Normal bilateral adrenal glands. Again noted is stone in the left kidney lower pole measuring approximately 3 mm. Again noted are staghorn calculi involving the right renal pelvis and right lower pole. There is scarring and atrophy involving the right kidney lower pole. Additional stone in the right kidney upper pole. No ureter dilatation. Normal appearance of the urinary bladder. Stomach/Bowel: No evidence for bowel distention or focal bowel inflammation. Normal appearance of the stomach. Lymphatic: There is a very low-density structure posterior to the portal vein confluence on sequence 7, image 105. This structure is fluid density and measures 1.5 x 1.0 cm. This could represent a low-density lymph node or possibly an exophytic pancreatic cystic structure. There was a lymph node in this area in 2018 that measured up to 0.9 cm. Otherwise, there is no significant lymph node enlargement in the abdomen or pelvis. Reproductive: Uterus and bilateral adnexa are unremarkable. Other: No ascites.  Negative for free air. Musculoskeletal: Old compression fracture at L4 with vertebral body cement. Old compression fracture at L1 appears stable. Stable disc space widening along the anterior aspect of L5-S1 is unchanged. Stable disc space narrowing at L2-L3 and L3-L4. No acute bone abnormality. Review of the MIP images confirms the above findings. IMPRESSION: 1. Negative for an aortic aneurysm or dissection. 2.  Aortic Atherosclerosis (ICD10-I70.0). 3. Stenosis involving the renal arteries, right side greater than left. 4. Bilateral renal calculi.  Staghorn calculi in the right kidney. 5. Multiple vertebral body compression fractures. Vertebral body compression fractures in lumbar spine appear to be chronic. T10 compression fracture is age indeterminate and recommend clinical correlation in this area. 6. **An incidental finding of potential clinical significance has been  found. Low-density nodular structure near the pancreatic head and portal confluence. Structure measures up to 1.5 cm and may have enlarged over time. Not clear if this represents low-density lymph node or possibly a pancreatic cystic structure. Consider a follow-up CT of the abdomen with contrast in 3-6 months to evaluate for stability. No other areas are concerning for lymph node enlargement.** 7. Extrahepatic biliary system is mildly prominent and likely secondary to cholecystectomy. 8. Atelectasis at both lung bases with trace pleural fluid. Electronically Signed   By: Markus Daft M.D.   On: 09/07/2021 15:19    Procedures Procedures   Medications Ordered in ED Medications  HYDROmorphone (DILAUDID) injection 0.25 mg (0.25 mg Intravenous Given 09/07/21 1310)  sodium chloride 0.9 % bolus 1,000 mL (0 mLs Intravenous Stopped 09/07/21 1751)  iohexol (OMNIPAQUE) 350 MG/ML injection 80 mL (80 mLs Intravenous Contrast Given 09/07/21 1416)  HYDROmorphone (DILAUDID) injection 0.5 mg (0.5 mg Intravenous Given 09/07/21 1645)    ED Course  I have reviewed the triage vital signs and the nursing notes.  Pertinent labs & imaging results that were available during my care of the patient  were reviewed by me and considered in my medical decision making (see chart for details).    MDM Rules/Calculators/A&P                         Initial impression-presents with right-sided flank pain, alert, no acute stress, vital signs reassuring.  Unclear etiology I am concerned for possible dissection as pain is gotten worse, will obtain basic lab work-up, obtain dissection study and reassess.  Work-up-CBC shows normocytic anemia hemoglobin 8.9 a slight decrease from 10 about a month ago, BMP shows creatinine of 1.52, calcium 8.5 GFR 35 hepatic function panel shows total protein 6, albumin 3, lipase is 25, UA shows large leukocytes red blood cells many white blood cells few bacteria no squamous is noted.  CTA chest abdomen  pelvis negative for signs of aneurysm or dissection, stenosis involving the renal arteries right greater than left, renal calculus present staghorn in the right kidney, chronic spinal compression fractures, incidental finding of a low-density nodule in the pancreatic head,  Reassessment-patient reassessed, states she still slight pain but is no other complaints, will provide with additional dose of Dilaudid and reassess.  Noted the patient has an abnormal UA will culture urine and start her on antibiotics.  Patient was updated on lab work and imaging, she is agreement this plan, it is noted that she had a reading of 85% pulse ox on room air, while I was reassessing her,  O2 sats were in the mid 90s, likely her hands were cold of the pulse ox was not on properly.  She is nonhypoxic.  She stable for discharge.    Rule out- I have low suspicion for spinal fracture or spinal cord abnormality as patient denies urinary incontinency, retention, difficulty with bowel movements, denies saddle paresthesias.  Spine was palpated there is no step-off, crepitus or gross deformities felt, patient had 5/5 strength, full range of motion, neurovascular fully intact in the lower extremities.  Imaging negative for acute findings.  I have low suspicion for AAA or dissection as imaging is negative.  I have low suspicion for Pilo and/or kidney stone as imaging is also negative for these findings. Low suspicion for septic arthritis as patient denies IV drug use, skin exam was performed no erythematous, edema or warm joints noted.   Plan-  Right side pain-unclear etiology but likely multifactorial I suspect is mostly caused by the chronic compression fractures seen at L4, will have her follow-up with neurosurgery for further evaluation. UTI-urine cultures pain at this time, will start her on antibiotics, patient has an estimated creatinine clearance of 11.  MDCal, will renally dose this medication.  Have her follow-up with  her PCP for further evaluation. Incidental findings-patient was made aware of the stenosis of her renal arteries as well as the nodule seen on her pancreas, she is given a copy of the read from her scan today, will have her follow-up with her PCP for repeat CT scan for further evaluation.  Vital signs have remained stable, no indication for hospital admission.   Patient given at home care as well strict return precautions.  Patient verbalized that they understood agreed to said plan.     Final Clinical Impression(s) / ED Diagnoses Final diagnoses:  Chronic right-sided low back pain without sciatica  Acute cystitis without hematuria    Rx / DC Orders ED Discharge Orders          Ordered    cephALEXin (KEFLEX) 250 MG capsule  Daily        09/07/21 1749             Aron Baba 09/07/21 1810    Milton Ferguson, MD 09/10/21 [phone]

## 2021-09-07 NOTE — Discharge Instructions (Addendum)
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  Follow-up with your PCP and/or urologist in 1 week's time for reevaluation of urine. Pancreatic nodule this was seen on your CT scan please follow your PCP in 3 to 6 months for repeat scan for further evaluation. Renal stenosis-please follow-up with your PCP and/or your urologist for further evaluation of the stenosis of your renal arteries.  Come back to the emergency department if you develop chest pain, shortness of breath, severe abdominal pain, uncontrolled nausea, vomiting, diarrhea.

## 2021-09-07 NOTE — ED Triage Notes (Addendum)
Pt bib ems from [facility] for back pain on the R side x 1 month.  Worse more recently.  Denies fall.  VSS by ems.  Resp even and unlabored.  Skin warm and dry.  Pt does have hx of kidney stone.  Reports took oxycodone at 0730.

## 2021-09-07 NOTE — ED Notes (Signed)
[facility] called to transport pt back to [facility]

## 2021-09-08 DIAGNOSIS — M545 Low back pain, unspecified: Secondary | ICD-10-CM | POA: Diagnosis not present

## 2021-09-08 MED ORDER — OXYCODONE HCL 5 MG PO TABS
5.0000 mg | ORAL_TABLET | Freq: Once | ORAL | Status: AC
  Administered 2021-09-08: 5 mg via ORAL
  Filled 2021-09-08: qty 1

## 2021-09-08 NOTE — ED Notes (Signed)
Given graham crackers while waiting for ride. ETA 8:30 per Network engineer.

## 2021-09-08 NOTE — ED Notes (Signed)
Attempted to call report x 2 to [facility], no answer. EMS transport is aware.

## 2021-09-08 NOTE — ED Notes (Signed)
Pt yelling help and was asking pain medicine. This nurse informed EDP and MD ordered Oxycodone. Pt appears upset saying she needs to get out of here and that someone should get in touch with [facility] to see if they can come get her. Pt education done, informed pt that she's been discharged since yesterday and secretary's gonna follow up and work on her ride this morning.

## 2021-09-10 LAB — URINE CULTURE: Culture: >=100000 — AB

## 2021-09-11 ENCOUNTER — Telehealth (HOSPITAL_BASED_OUTPATIENT_CLINIC_OR_DEPARTMENT_OTHER): Payer: Self-pay | Admitting: *Deleted

## 2021-09-11 NOTE — Telephone Encounter (Signed)
Post ED Visit - Positive Culture Follow-up  Culture report reviewed by antimicrobial stewardship pharmacist: [facility] Team []  Elenor Quinones, Pharm.D. []  Heide Guile, Pharm.D., BCPS AQ-ID []  Parks Neptune, Pharm.D., BCPS []  Alycia Rossetti, Pharm.D., BCPS []  [place], [place].D., BCPS, AAHIVP []  Legrand Como, Pharm.D., BCPS, AAHIVP []  Salome Arnt, PharmD, BCPS []  Johnnette Gourd, PharmD, BCPS []  Hughes Better, PharmD, BCPS []  Leeroy Cha, PharmD []  Laqueta Linden, PharmD, BCPS [x]  Sabas Sous, PharmD  [place] Team []  Leodis Sias, PharmD []  Lindell Spar, PharmD []  Royetta Asal, PharmD []  Graylin Shiver, Rph []  Rema Fendt) Glennon Mac, PharmD []  Arlyn Dunning, PharmD []  Netta Cedars, PharmD []  Dia Sitter, PharmD []  Leone Haven, PharmD []  Gretta Arab, PharmD []  Theodis Shove, PharmD []  Peggyann Juba, PharmD []  Reuel Boom, PharmD   Positive urine culture Treated with Cephalexin, organism sensitive to the same and no further patient follow-up is required at this time.  Rosie Fate 09/11/2021, 11:53 AM

## 2021-09-18 ENCOUNTER — Ambulatory Visit: Payer: Medicare Other | Admitting: Urology

## 2021-09-20 ENCOUNTER — Ambulatory Visit (HOSPITAL_COMMUNITY): Payer: Commercial Managed Care - HMO

## 2021-09-23 ENCOUNTER — Ambulatory Visit (HOSPITAL_COMMUNITY): Payer: Medicare Other

## 2021-09-24 ENCOUNTER — Encounter: Payer: Self-pay | Admitting: Urology

## 2021-09-24 ENCOUNTER — Other Ambulatory Visit: Payer: Self-pay

## 2021-09-24 ENCOUNTER — Ambulatory Visit (INDEPENDENT_AMBULATORY_CARE_PROVIDER_SITE_OTHER): Payer: Medicare Other | Admitting: Urology

## 2021-09-24 VITALS — BP 94/62 | HR 72 | Ht 62.0 in | Wt 110.0 lb

## 2021-09-24 DIAGNOSIS — N200 Calculus of kidney: Secondary | ICD-10-CM

## 2021-09-24 DIAGNOSIS — Z87440 Personal history of urinary (tract) infections: Secondary | ICD-10-CM

## 2021-09-24 NOTE — Progress Notes (Signed)
Urological Symptom Review  Patient is experiencing the following symptoms: Hard to postpone urination Kidney stones   Review of Systems  Gastrointestinal (upper)  : Negative for upper GI symptoms  Gastrointestinal (lower) : Negative for lower GI symptoms  Constitutional : Fatigue  Skin: Negative for skin symptoms  Eyes: Blurred vision  Ear/Nose/Throat : Sinus problems  Hematologic/Lymphatic: Easy bruising  Cardiovascular : Negative for cardiovascular symptoms  Respiratory : Cough Shortness of breath  Endocrine: Negative for endocrine symptoms  Musculoskeletal: Back pain  Neurological: Negative for neurological symptoms  Psychologic: Depression Anxiety

## 2021-09-24 NOTE — Progress Notes (Signed)
Assessment: 1. Nephrolithiasis; right, partial staghorn   2. History of UTI      Plan: I reviewed the patient's records from her ER visit on 09/07/2021 including the results from the CT chest abdomen and pelvis as well as her urine culture results showing a Proteus UTI.  The CT shows the large right lower pole staghorn calculus with associated scarring and atrophy of the right kidney but no signs of obvious obstruction.  She continues to have some right-sided back pain which I do not think is directly related to her stone.  Given the stone size, I again discussed treatment options with likely the only feasible option being percutaneous nephrolithotomy.  She does have significant surgical risk given her multiple medical problems and any surgical procedures would likely need to be done at [facility] long. She is interested in discussing this option. I will make arrangements for a consultation with Dr. Tresa Moore at [facility] Will request a repeat urinalysis at [facility] as well.  Chief Complaint:  Chief Complaint  Patient presents with   Nephrolithiasis    History of Present Illness:  Jordan [name] is a 76 y.o. year old female who is seen for further evaluation of right staghorn calculus.  At her initial visit in 12/22, she reported a 2-week history of some right-sided back pain with radiation to the right upper abdomen.  This is worsened with movement and with certain positions such as lying down.  She also reported some decreased appetite and nausea.  No fevers or chills.  She has noted some remittent pink coloration to her urine over the past 2 months.  She has a prior history of kidney stones undergoing a ureteroscopic stone manipulation in the past.  CT imaging from 07/25/2021 showed a 5 cm staghorn calculus involving the lower pole of the right kidney and extending into the right renal pelvis, a 4 mm nonobstructing left interpolar renal calculus, no significant hydronephrosis, and no  ureteral calculi. She has not had any recent UTIs. She does have a history of renal insufficiency, her recent creatinine was 1.33. Urinalysis from 07/31/2021 showed 0-5 RBCs, 11-20 WBCs, and rare bacteria.  She was recently evaluated with CT imaging on 09/07/2021.  CT showed the large lower pole renal calculus as well as scarring and atrophy involving the right lower pole, without significant obstruction.  Urine culture grew >100 K. Proteus.  She was treated with antibiotics.  She is not having any UTI symptoms at this time.  She continues to have right-sided back pain.  Portions of the above documentation were copied from a prior visit for review purposes only.    Past Medical History:  Past Medical History:  Diagnosis Date   Allergy    seasonal   Anemia    Arthritis    Asthma    uses Advair and Spiriva daily,Albuterol prn.Takes Singulair at bedtime and Prednisone daily   Back spasm    takes Zanaflex daily as needed   Blood transfusion 2007   no abnormal reation noted    Bruises easily    d/t being on Prednisone   Bursitis    left elbow   C. difficile colitis    2007: treated with Flagyl and Vanc, 2014: treated with vanc, Aug 2014: Vanc taper   Chronic back pain    Chronic kidney disease    "creatine" creeping up - followed at this time by PCP   Complication of anesthesia 2000   woke up during one surgery- ovary  surgery   COPD (chronic obstructive pulmonary disease) (HCC)    Depression    takes Prozac daily   Dysphasia    Falls    H/O hiatal hernia    Hemorrhoids    History of bronchitis    last time a yr ago   History of colon polyps    History of kidney stones    History of kidney stones    Hyperlipidemia    hx of-was on meds but has been off x 1 1/2 yrs   Hypertension    takes Verapamil nightly   IBS (irritable bowel syndrome)    Insomnia    takes Benadryl as needed   Myocardial infarction [facility]) 2007   Takotsubo cardiomyopathy   Neurogenic bowel     Neuromuscular dysfunction of bladder, unspecified    Nocturia    Osteoporosis    takes Fosamax weekly   Pneumonia    MRSA pneumonia in 2007   Shortness of breath    with exertion daily   Stroke [facility])    "they say I has a small stroke".No deficits   Tingling    and pain in left leg   Urinary frequency     Past Surgical History:  Past Surgical History:  Procedure Laterality Date   APPENDECTOMY     BIOPSY  04/13/2017   Procedure: BIOPSY;  Surgeon: Daneil Dolin, MD;  [facility];  Service: Endoscopy;;  right and left colon    BIOPSY  07/06/2018   Procedure: BIOPSY;  Surgeon: Danie Binder, MD;  [facility];  Service: Endoscopy;;  Random Colon   CARPAL TUNNEL RELEASE Left    CARPAL TUNNEL RELEASE Right 09/21/2015   Procedure: CARPAL TUNNEL RELEASE;  Surgeon: Carole Civil, MD;  Location: [facility] OR[facility];  Service: Orthopedics;  Laterality: Right;   cataract surgery Bilateral    CHOLECYSTECTOMY     COLONOSCOPY  09/24/2006   RMR: Normal rectum, left-sided diverticula   COLONOSCOPY  06/2011   Dr. Henrene Pastor: tubular adenomas, moderative diverticulosis, internal hemorrhoids   COLONOSCOPY WITH PROPOFOL N/A 04/13/2017   Dr. Gala Romney: internal hemorrhoids, diverticulosis   CYSTOSCOPY     ENDARTERECTOMY Right 12/12/2015   Procedure: ENDARTERECTOMY RIGHT CAROTID, RESECTION OF REDUNDANT RIGHT COMMON CAROTID ARTERY WITH PRIMARY REANASTOMOSIS;  Surgeon: Mal Misty, MD;  Location: [facility];  Service: Vascular;  Laterality: Right;   ESOPHAGOGASTRODUODENOSCOPY  04/07/2006   LZJ:QBHALP esophagus s/p placement of Bravo pH probe, some surgical changes but not typical of fundoplication wrap, may have slipped   FLEXIBLE SIGMOIDOSCOPY N/A 07/06/2018   Procedure: FLEXIBLE SIGMOIDOSCOPY WITH PROPOFOL;  Surgeon: Danie Binder, MD;  [facility];  Service: Endoscopy;  Laterality: N/A;  needs to be done first before the other inpatient    GANGLION CYST EXCISION Right  09/21/2015   Procedure: RIGHT WRIST GANGLION CYST REMOVAL;  Surgeon: Carole Civil, MD;  Location: [facility];  Service: Orthopedics;  Laterality: Right;   hemorrhoidal banding     HERNIA REPAIR     umbilical   KNEE ARTHROSCOPY Right    KNEE SURGERY Right    KYPHOPLASTY N/A 02/23/2018   Procedure: KYPHOPLASTY LUMBAR FOUR;  Surgeon: Newman Pies, MD;  Location: [place];  Service: Neurosurgery;  Laterality: N/A;   LUMBAR LAMINECTOMY/DECOMPRESSION MICRODISCECTOMY Left 10/17/2013   Procedure: LUMBAR LAMINECTOMY/DECOMPRESSION MICRODISCECTOMY 1 LEVEL three/four;  Surgeon: Ophelia Charter, MD;  Location: [place] [facility];  Service: Neurosurgery;  Laterality: Left;   NECK SURGERY  fusion-    NISSEN FUNDOPLICATION     x 2   OLECRANON BURSECTOMY Left 01/11/2014   Procedure: OLECRANON BURSECTOMY;  Surgeon: Carole Civil, MD;  Location: [facility];  Service: Orthopedics;  Laterality: Left;   OOPHORECTOMY Right    PATCH ANGIOPLASTY Right 12/12/2015   Procedure: PATCH ANGIOPLASTY RIGHT CAROTID ARTERY USING [place];  Surgeon: Mal Misty, MD;  Location: [place];  Service: Vascular;  Laterality: Right;   TONSILLECTOMY      Allergies:  Allergies  Allergen Reactions   Cardura [Doxazosin Mesylate] Hives   Cardura [Doxazosin]    Barbiturates Nausea And Vomiting and Rash   Levofloxacin Rash   Sulfa Antibiotics Nausea Only and Rash    Family History:  Family History  Problem Relation Age of Onset   Heart disease Father    Colon cancer Neg Hx     Social History:  Social History   Tobacco Use   Smoking status: Never   Smokeless tobacco: Never  Vaping Use   Vaping Use: Never used  Substance Use Topics   Alcohol use: Not Currently    Alcohol/week: 10.0 standard drinks    Types: 10 Cans of beer per week    Comment: couple of beers daily   Drug use: No    ROS: Constitutional:  Negative for fever, chills, weight loss CV: Negative for chest pain, previous  MI, hypertension Respiratory:  Negative for shortness of breath, wheezing, sleep apnea, frequent cough GI:  Negative for nausea, vomiting, bloody stool, GERD  Physical exam: BP 94/62    Pulse 72    Ht 5\' 2"  (1.575 m)    Wt 110 lb (49.9 kg)    BMI 20.12 kg/m  GENERAL APPEARANCE:  Well appearing, well developed, well nourished, NAD HEENT:  Atraumatic, normocephalic, oropharynx clear NECK:  Supple without lymphadenopathy or thyromegaly ABDOMEN:  Soft, non-tender, no masses EXTREMITIES:  Moves all extremities well, without clubbing, cyanosis, or edema NEUROLOGIC:  Alert and oriented x 3, in wheelchair, CN II-XII grossly intact MENTAL STATUS:  appropriate BACK:  Non-tender to palpation, No CVAT SKIN:  Warm, dry, and intact   Results: No specimen provided

## 2021-09-30 ENCOUNTER — Ambulatory Visit (HOSPITAL_COMMUNITY): Admission: RE | Admit: 2021-09-30 | Payer: Medicare Other | Source: Ambulatory Visit

## 2021-10-15 ENCOUNTER — Encounter: Payer: Self-pay | Admitting: Gastroenterology

## 2021-10-15 ENCOUNTER — Ambulatory Visit (INDEPENDENT_AMBULATORY_CARE_PROVIDER_SITE_OTHER): Payer: Medicare Other | Admitting: Gastroenterology

## 2021-10-15 ENCOUNTER — Other Ambulatory Visit: Payer: Self-pay

## 2021-10-15 VITALS — BP 110/74 | HR 108 | Temp 96.8°F | Ht 62.0 in | Wt 103.4 lb

## 2021-10-15 DIAGNOSIS — K642 Third degree hemorrhoids: Secondary | ICD-10-CM

## 2021-10-15 NOTE — Progress Notes (Signed)
° °  [place] Banding Note:   Jordan [name] is a 76 y.o. female presenting today for consideration of hemorrhoid banding. She is well-known to this practice. Hemorrhoid banding in 2014/2015 with overall good response. Recurrent symptomatic Grade 3 hemorrhoids now with bleeding, pressure, and soiling. Last colonoscopy 2019. Neutral banding at first visit in July 2022. Left lateral banding in Dec 2022. She has not noticed much difference since last banding.     The patient presents with symptomatic grade 3 hemorrhoids, unresponsive to maximal medical therapy, requesting rubber band ligation of his/her hemorrhoidal disease. All risks, benefits, and alternative forms of therapy were described and informed consent was obtained.   The decision was made to band the right anterior internal hemorrhoid, and the [place] was used to perform band ligation without complication. Digital anorectal examination was then performed to assure proper positioning of the band, and to adjust the banded tissue as required. The patient was discharged home without pain or other issues. Dietary and behavioral recommendations were given and (if necessary prescriptions were given), along with follow-up instructions. The patient will return several weeks for follow-up and banding.   No complications were encountered and the patient tolerated the procedure well.   Annitta Needs, PhD, ANP-BC [facility]

## 2021-10-15 NOTE — Patient Instructions (Signed)
I will see you back in a few weeks for further banding!  It was good to see you today!  I enjoyed seeing you again today! As you know, I value our relationship and want to provide genuine, compassionate, and quality care. I welcome your feedback. If you receive a survey regarding your visit,  I greatly appreciate you taking time to fill this out. See you next time!  Annitta Needs, PhD, ANP-BC [facility]

## 2021-11-25 ENCOUNTER — Telehealth: Payer: Self-pay | Admitting: Urology

## 2021-11-25 NOTE — Telephone Encounter (Signed)
Per Marlowe Kays at [facility], ? ?"Rejection Reason - Patient did not respond" ?Rosalio Loud said on Nov 21, 2021 3:39 PM ?

## 2021-11-26 ENCOUNTER — Encounter (HOSPITAL_COMMUNITY): Payer: Self-pay | Admitting: Radiology

## 2021-11-26 ENCOUNTER — Ambulatory Visit (HOSPITAL_COMMUNITY)
Admission: RE | Admit: 2021-11-26 | Discharge: 2021-11-26 | Disposition: A | Discharge status: Home / Self Care | Payer: Medicare Other | Source: Ambulatory Visit | Attending: Urology | Admitting: Urology

## 2021-11-26 ENCOUNTER — Other Ambulatory Visit: Payer: Self-pay

## 2021-11-26 DIAGNOSIS — N200 Calculus of kidney: Secondary | ICD-10-CM | POA: Diagnosis present

## 2021-11-26 DIAGNOSIS — M546 Pain in thoracic spine: Secondary | ICD-10-CM | POA: Insufficient documentation

## 2021-11-26 LAB — POCT I-STAT CREATININE: Creatinine, Ser: 1.6 mg/dL — ABNORMAL HIGH (ref 0.44–1.00)

## 2021-11-26 MED ORDER — IOHEXOL 300 MG/ML  SOLN
100.0000 mL | Freq: Once | INTRAMUSCULAR | Status: AC | PRN
  Administered 2021-11-26: 75 mL via INTRAVENOUS

## 2021-12-31 ENCOUNTER — Ambulatory Visit (INDEPENDENT_AMBULATORY_CARE_PROVIDER_SITE_OTHER): Payer: Medicare Other | Admitting: Gastroenterology

## 2021-12-31 VITALS — BP 98/50 | HR 88 | Temp 97.3°F | Ht 62.0 in | Wt 104.0 lb

## 2021-12-31 DIAGNOSIS — K529 Noninfective gastroenteritis and colitis, unspecified: Secondary | ICD-10-CM | POA: Diagnosis not present

## 2021-12-31 MED ORDER — COLESTIPOL HCL 1 G PO TABS: 2.0000 g | ORAL_TABLET | Freq: Every day | ORAL | 3 refills | Status: DC

## 2021-12-31 NOTE — Progress Notes (Signed)
? ? ? ? ? ?  [place] BANDING PROCEDURE NOTE ? ?Jordan [name] is a 76 y.o.  female presenting today for consideration of hemorrhoid banding. She is well-known to this practice. Hemorrhoid banding in 2014/2015 with overall good response. Recurrent symptomatic Grade 3 hemorrhoids now with bleeding, pressure, and soiling. Last colonoscopy 2019. Neutral banding at first visit in July 2022. Left lateral banding in Dec 2022. Right anterior banding in Feb 2023. Main complaint is chronic diarrhea. Dicyclomine without much improvement.  ? ?In the left lateral decubitus position, anoscopic examination revealed no significant hemorrhoids that would be amenable to banding.  ? ?No banding was done today. Instead, we will stop dicyclomine and trial Colestid BID. She will return in 3 months.  ? ? ?Jordan Needs, PhD, ANP-BC ?[facility]  ? ?

## 2021-12-31 NOTE — Patient Instructions (Signed)
We are stopping dicyclomine (Bentyl). ? ?Instead, let's do Colestid 2 tablets each morning. We can increase this to twice a day if needed. ? ?We will see you in 3 months! ? ?I enjoyed seeing you again today! As you know, I value our relationship and want to provide genuine, compassionate, and quality care. I welcome your feedback. If you receive a survey regarding your visit,  I greatly appreciate you taking time to fill this out. See you next time! ? ?Annitta Needs, PhD, ANP-BC ?[facility]  ? ?

## 2022-01-13 ENCOUNTER — Telehealth: Payer: Self-pay

## 2022-01-13 NOTE — Telephone Encounter (Signed)
Returned the pt's call from vm. Had to leave a vm for pt to return call. ?

## 2022-01-13 NOTE — Telephone Encounter (Signed)
Jordan [name] I spoke with the pt and was advised that the medication you switched her to was giving her explosive diarrhea. She has not taken any of it today. Please advise regarding what you she needs to do. She questions whether or not she should go back to the other medication. ?

## 2022-01-13 NOTE — Telephone Encounter (Signed)
That's interesting. Yes, we can go back to dicyclomine 10 mg before meals and at bedtime.  ?

## 2022-01-14 ENCOUNTER — Encounter: Payer: Self-pay | Admitting: Gastroenterology

## 2022-01-14 ENCOUNTER — Telehealth: Payer: Self-pay

## 2022-01-14 NOTE — Telephone Encounter (Signed)
I have completed a letter to be faxed to her facility.  ?

## 2022-01-14 NOTE — Telephone Encounter (Signed)
Also sent her a message in her myChart ?

## 2022-01-14 NOTE — Telephone Encounter (Signed)
Phoned and LMOVM for the pt to go back to what she was taking and if she has questions to call me back. ?

## 2022-01-14 NOTE — Telephone Encounter (Signed)
Phoned to [facility] and got the fax number to send the D/C over ?

## 2022-01-14 NOTE — Telephone Encounter (Signed)
Pt LMOVM regarding the medication she needs to stop. Where she is at needs a D/C letter from you stating discontinue one and start the other medication. From the way the pt is talking she may be in a facility I don't know. I thought she lived at home. ? ?Called the pt and went to vm ?

## 2022-01-15 NOTE — Telephone Encounter (Signed)
Faxed the D/C order to [facility] with confirmation coming back that they had received/delivered it. ?

## 2022-02-26 ENCOUNTER — Encounter: Payer: Self-pay | Admitting: Gastroenterology

## 2022-03-17 ENCOUNTER — Encounter (INDEPENDENT_AMBULATORY_CARE_PROVIDER_SITE_OTHER): Payer: Self-pay

## 2022-03-23 ENCOUNTER — Other Ambulatory Visit: Payer: Self-pay

## 2022-03-23 ENCOUNTER — Encounter (HOSPITAL_COMMUNITY): Payer: Self-pay | Admitting: *Deleted

## 2022-03-23 ENCOUNTER — Emergency Department (HOSPITAL_COMMUNITY): Payer: Medicare Other

## 2022-03-23 ENCOUNTER — Other Ambulatory Visit (HOSPITAL_COMMUNITY): Payer: Medicare Other

## 2022-03-23 ENCOUNTER — Inpatient Hospital Stay (HOSPITAL_COMMUNITY): Payer: Medicare Other

## 2022-03-23 ENCOUNTER — Inpatient Hospital Stay (HOSPITAL_COMMUNITY)
Admission: EM | Admit: 2022-03-23 | Discharge: 2022-03-26 | DRG: 308 | Disposition: A | Discharge status: Other Acute Inpatient Hospital | Payer: Medicare Other | Attending: Family Medicine | Admitting: Family Medicine

## 2022-03-23 DIAGNOSIS — Z87891 Personal history of nicotine dependence: Secondary | ICD-10-CM

## 2022-03-23 DIAGNOSIS — B964 Proteus (mirabilis) (morganii) as the cause of diseases classified elsewhere: Secondary | ICD-10-CM | POA: Diagnosis present

## 2022-03-23 DIAGNOSIS — Z8719 Personal history of other diseases of the digestive system: Secondary | ICD-10-CM

## 2022-03-23 DIAGNOSIS — J449 Chronic obstructive pulmonary disease, unspecified: Secondary | ICD-10-CM | POA: Diagnosis present

## 2022-03-23 DIAGNOSIS — I4891 Unspecified atrial fibrillation: Secondary | ICD-10-CM | POA: Diagnosis present

## 2022-03-23 DIAGNOSIS — J9601 Acute respiratory failure with hypoxia: Secondary | ICD-10-CM | POA: Diagnosis present

## 2022-03-23 DIAGNOSIS — L89510 Pressure ulcer of right ankle, unstageable: Secondary | ICD-10-CM | POA: Diagnosis present

## 2022-03-23 DIAGNOSIS — Z681 Body mass index (BMI) 19 or less, adult: Secondary | ICD-10-CM | POA: Diagnosis not present

## 2022-03-23 DIAGNOSIS — K649 Unspecified hemorrhoids: Secondary | ICD-10-CM | POA: Diagnosis present

## 2022-03-23 DIAGNOSIS — Z79899 Other long term (current) drug therapy: Secondary | ICD-10-CM

## 2022-03-23 DIAGNOSIS — I639 Cerebral infarction, unspecified: Secondary | ICD-10-CM | POA: Diagnosis not present

## 2022-03-23 DIAGNOSIS — N319 Neuromuscular dysfunction of bladder, unspecified: Secondary | ICD-10-CM | POA: Diagnosis present

## 2022-03-23 DIAGNOSIS — Z8249 Family history of ischemic heart disease and other diseases of the circulatory system: Secondary | ICD-10-CM

## 2022-03-23 DIAGNOSIS — I480 Paroxysmal atrial fibrillation: Secondary | ICD-10-CM | POA: Diagnosis not present

## 2022-03-23 DIAGNOSIS — I1310 Hypertensive heart and chronic kidney disease without heart failure, with stage 1 through stage 4 chronic kidney disease, or unspecified chronic kidney disease: Secondary | ICD-10-CM | POA: Diagnosis present

## 2022-03-23 DIAGNOSIS — Z888 Allergy status to other drugs, medicaments and biological substances status: Secondary | ICD-10-CM

## 2022-03-23 DIAGNOSIS — Z8673 Personal history of transient ischemic attack (TIA), and cerebral infarction without residual deficits: Secondary | ICD-10-CM

## 2022-03-23 DIAGNOSIS — I5181 Takotsubo syndrome: Secondary | ICD-10-CM | POA: Diagnosis present

## 2022-03-23 DIAGNOSIS — M4856XA Collapsed vertebra, not elsewhere classified, lumbar region, initial encounter for fracture: Secondary | ICD-10-CM | POA: Diagnosis present

## 2022-03-23 DIAGNOSIS — I252 Old myocardial infarction: Secondary | ICD-10-CM | POA: Diagnosis not present

## 2022-03-23 DIAGNOSIS — N390 Urinary tract infection, site not specified: Secondary | ICD-10-CM | POA: Diagnosis present

## 2022-03-23 DIAGNOSIS — Z882 Allergy status to sulfonamides status: Secondary | ICD-10-CM

## 2022-03-23 DIAGNOSIS — I999 Unspecified disorder of circulatory system: Secondary | ICD-10-CM | POA: Diagnosis present

## 2022-03-23 DIAGNOSIS — F32A Depression, unspecified: Secondary | ICD-10-CM | POA: Diagnosis present

## 2022-03-23 DIAGNOSIS — M810 Age-related osteoporosis without current pathological fracture: Secondary | ICD-10-CM | POA: Diagnosis present

## 2022-03-23 DIAGNOSIS — E785 Hyperlipidemia, unspecified: Secondary | ICD-10-CM | POA: Diagnosis present

## 2022-03-23 DIAGNOSIS — K648 Other hemorrhoids: Secondary | ICD-10-CM | POA: Diagnosis present

## 2022-03-23 DIAGNOSIS — N1832 Chronic kidney disease, stage 3b: Secondary | ICD-10-CM | POA: Diagnosis present

## 2022-03-23 DIAGNOSIS — K589 Irritable bowel syndrome without diarrhea: Secondary | ICD-10-CM | POA: Diagnosis present

## 2022-03-23 DIAGNOSIS — D631 Anemia in chronic kidney disease: Secondary | ICD-10-CM | POA: Diagnosis present

## 2022-03-23 DIAGNOSIS — I130 Hypertensive heart and chronic kidney disease with heart failure and stage 1 through stage 4 chronic kidney disease, or unspecified chronic kidney disease: Secondary | ICD-10-CM | POA: Diagnosis present

## 2022-03-23 DIAGNOSIS — Z881 Allergy status to other antibiotic agents status: Secondary | ICD-10-CM

## 2022-03-23 DIAGNOSIS — R110 Nausea: Secondary | ICD-10-CM

## 2022-03-23 DIAGNOSIS — Z981 Arthrodesis status: Secondary | ICD-10-CM

## 2022-03-23 DIAGNOSIS — E440 Moderate protein-calorie malnutrition: Secondary | ICD-10-CM | POA: Diagnosis present

## 2022-03-23 DIAGNOSIS — Z87310 Personal history of (healed) osteoporosis fracture: Secondary | ICD-10-CM

## 2022-03-23 DIAGNOSIS — Z7951 Long term (current) use of inhaled steroids: Secondary | ICD-10-CM

## 2022-03-23 DIAGNOSIS — J90 Pleural effusion, not elsewhere classified: Secondary | ICD-10-CM

## 2022-03-23 LAB — COMPREHENSIVE METABOLIC PANEL
ALT: 20 U/L (ref 0–44)
AST: 24 U/L (ref 15–41)
Albumin: 3.1 g/dL — ABNORMAL LOW (ref 3.5–5.0)
Alkaline Phosphatase: 92 U/L (ref 38–126)
Anion gap: 8 (ref 5–15)
BUN: 16 mg/dL (ref 8–23)
CO2: 23 mmol/L (ref 22–32)
Calcium: 8.8 mg/dL — ABNORMAL LOW (ref 8.9–10.3)
Chloride: 106 mmol/L (ref 98–111)
Creatinine, Ser: 1.26 mg/dL — ABNORMAL HIGH (ref 0.44–1.00)
GFR, Estimated: 44 mL/min — ABNORMAL LOW (ref >60)
Glucose, Bld: 170 mg/dL — ABNORMAL HIGH (ref 70–99)
Potassium: 3.9 mmol/L (ref 3.5–5.1)
Sodium: 137 mmol/L (ref 135–145)
Total Bilirubin: 0.7 mg/dL (ref 0.3–1.2)
Total Protein: 6.5 g/dL (ref 6.5–8.1)

## 2022-03-23 LAB — URINALYSIS, ROUTINE W REFLEX MICROSCOPIC
Bacteria, UA: NONE SEEN
Bilirubin Urine: NEGATIVE
Glucose, UA: NEGATIVE mg/dL
Ketones, ur: NEGATIVE mg/dL
Nitrite: NEGATIVE
Protein, ur: 30 mg/dL — AB
Specific Gravity, Urine: 1.017 (ref 1.005–1.030)
WBC, UA: >50 WBC/hpf — ABNORMAL HIGH (ref 0–5)
pH: 7 (ref 5.0–8.0)

## 2022-03-23 LAB — LIPASE, BLOOD: Lipase: 22 U/L (ref 11–51)

## 2022-03-23 LAB — TROPONIN I (HIGH SENSITIVITY)
Troponin I (High Sensitivity): 4 ng/L (ref <18)
Troponin I (High Sensitivity): 3 ng/L (ref <18)

## 2022-03-23 LAB — CBC
HCT: 30.5 % — ABNORMAL LOW (ref 36.0–46.0)
Hemoglobin: 9.9 g/dL — ABNORMAL LOW (ref 12.0–15.0)
MCH: 29.6 pg (ref 26.0–34.0)
MCHC: 32.5 g/dL (ref 30.0–36.0)
MCV: 91.3 fL (ref 80.0–100.0)
Platelets: 274 10*3/uL (ref 150–400)
RBC: 3.34 MIL/uL — ABNORMAL LOW (ref 3.87–5.11)
RDW: 14 % (ref 11.5–15.5)
WBC: 13.1 10*3/uL — ABNORMAL HIGH (ref 4.0–10.5)
nRBC: 0 % (ref 0.0–0.2)

## 2022-03-23 LAB — CBC WITH DIFFERENTIAL/PLATELET
Abs Immature Granulocytes: 0.03 10*3/uL (ref 0.00–0.07)
Basophils Absolute: 0.1 10*3/uL (ref 0.0–0.1)
Basophils Relative: 1 %
Eosinophils Absolute: 2.7 10*3/uL — ABNORMAL HIGH (ref 0.0–0.5)
Eosinophils Relative: 22 %
HCT: 30.8 % — ABNORMAL LOW (ref 36.0–46.0)
Hemoglobin: 9.8 g/dL — ABNORMAL LOW (ref 12.0–15.0)
Immature Granulocytes: 0 %
Lymphocytes Relative: 4 %
Lymphs Abs: 0.5 10*3/uL — ABNORMAL LOW (ref 0.7–4.0)
MCH: 29.7 pg (ref 26.0–34.0)
MCHC: 31.8 g/dL (ref 30.0–36.0)
MCV: 93.3 fL (ref 80.0–100.0)
Monocytes Absolute: 0.5 10*3/uL (ref 0.1–1.0)
Monocytes Relative: 4 %
Neutro Abs: 8.7 10*3/uL — ABNORMAL HIGH (ref 1.7–7.7)
Neutrophils Relative %: 69 %
Platelets: 260 10*3/uL (ref 150–400)
RBC: 3.3 MIL/uL — ABNORMAL LOW (ref 3.87–5.11)
RDW: 14.1 % (ref 11.5–15.5)
WBC: 12.5 10*3/uL — ABNORMAL HIGH (ref 4.0–10.5)
nRBC: 0 % (ref 0.0–0.2)

## 2022-03-23 LAB — BRAIN NATRIURETIC PEPTIDE: B Natriuretic Peptide: 580 pg/mL — ABNORMAL HIGH (ref 0.0–100.0)

## 2022-03-23 LAB — MRSA NEXT GEN BY PCR, NASAL: MRSA by PCR Next Gen: NOT DETECTED

## 2022-03-23 LAB — TSH: TSH: 1.804 u[IU]/mL (ref 0.350–4.500)

## 2022-03-23 MED ORDER — SODIUM CHLORIDE 0.9 % IV BOLUS
1000.0000 mL | Freq: Once | INTRAVENOUS | Status: AC
  Administered 2022-03-23: 1000 mL via INTRAVENOUS

## 2022-03-23 MED ORDER — METOPROLOL TARTRATE 25 MG PO TABS
25.0000 mg | ORAL_TABLET | Freq: Two times a day (BID) | ORAL | Status: DC
  Administered 2022-03-23 – 2022-03-26 (×7): 25 mg via ORAL
  Filled 2022-03-23 (×7): qty 1

## 2022-03-23 MED ORDER — CHLORHEXIDINE GLUCONATE CLOTH 2 % EX PADS
6.0000 | MEDICATED_PAD | Freq: Every day | CUTANEOUS | Status: DC
  Administered 2022-03-24 – 2022-03-26 (×3): 6 via TOPICAL

## 2022-03-23 MED ORDER — MONTELUKAST SODIUM 10 MG PO TABS
10.0000 mg | ORAL_TABLET | Freq: Every day | ORAL | Status: DC
  Administered 2022-03-24 – 2022-03-26 (×3): 10 mg via ORAL
  Filled 2022-03-23 (×4): qty 1

## 2022-03-23 MED ORDER — SODIUM CHLORIDE 0.9% FLUSH: 3.0000 mL | INTRAVENOUS | Status: DC | PRN

## 2022-03-23 MED ORDER — IOHEXOL 300 MG/ML  SOLN
80.0000 mL | Freq: Once | INTRAMUSCULAR | Status: AC | PRN
  Administered 2022-03-23: 80 mL via INTRAVENOUS

## 2022-03-23 MED ORDER — GUAIFENESIN 100 MG/5ML PO LIQD: 10.0000 mL | Freq: Four times a day (QID) | ORAL | Status: DC | PRN

## 2022-03-23 MED ORDER — MELATONIN 3 MG PO TABS
6.0000 mg | ORAL_TABLET | Freq: Every day | ORAL | Status: DC
  Administered 2022-03-23 – 2022-03-25 (×3): 6 mg via ORAL
  Filled 2022-03-23 (×3): qty 2

## 2022-03-23 MED ORDER — FLUOXETINE HCL 20 MG PO CAPS
40.0000 mg | ORAL_CAPSULE | Freq: Every day | ORAL | Status: DC
  Administered 2022-03-23 – 2022-03-26 (×4): 40 mg via ORAL
  Filled 2022-03-23 (×4): qty 2

## 2022-03-23 MED ORDER — FUROSEMIDE 10 MG/ML IJ SOLN
20.0000 mg | Freq: Once | INTRAMUSCULAR | Status: AC
  Administered 2022-03-23: 20 mg via INTRAVENOUS
  Filled 2022-03-23: qty 2

## 2022-03-23 MED ORDER — SODIUM CHLORIDE 0.9 % IV SOLN: 250.0000 mL | INTRAVENOUS | Status: DC | PRN

## 2022-03-23 MED ORDER — PANTOPRAZOLE SODIUM 40 MG IV SOLR
40.0000 mg | Freq: Once | INTRAVENOUS | Status: AC
Start: 2022-03-23 — End: 2022-03-23
  Administered 2022-03-23: 40 mg via INTRAVENOUS
  Filled 2022-03-23: qty 10

## 2022-03-23 MED ORDER — ONDANSETRON HCL 4 MG PO TABS: 4.0000 mg | ORAL_TABLET | Freq: Four times a day (QID) | ORAL | Status: DC | PRN

## 2022-03-23 MED ORDER — GABAPENTIN 100 MG PO CAPS
100.0000 mg | ORAL_CAPSULE | Freq: Every day | ORAL | Status: DC
  Administered 2022-03-23 – 2022-03-26 (×4): 100 mg via ORAL
  Filled 2022-03-23 (×4): qty 1

## 2022-03-23 MED ORDER — DILTIAZEM HCL-DEXTROSE 125-5 MG/125ML-% IV SOLN (PREMIX)
5.0000 mg/h | INTRAVENOUS | Status: DC
  Administered 2022-03-23: 5 mg/h via INTRAVENOUS
  Filled 2022-03-23: qty 125

## 2022-03-23 MED ORDER — FUROSEMIDE 10 MG/ML IJ SOLN
40.0000 mg | Freq: Once | INTRAMUSCULAR | Status: AC
  Administered 2022-03-24: 40 mg via INTRAVENOUS
  Filled 2022-03-23: qty 4

## 2022-03-23 MED ORDER — SODIUM CHLORIDE 0.9% FLUSH
3.0000 mL | Freq: Two times a day (BID) | INTRAVENOUS | Status: DC
Start: 2022-03-23 — End: 2022-03-26
  Administered 2022-03-23 – 2022-03-26 (×5): 3 mL via INTRAVENOUS

## 2022-03-23 MED ORDER — ACETAMINOPHEN 650 MG RE SUPP: 650.0000 mg | Freq: Four times a day (QID) | RECTAL | Status: DC | PRN

## 2022-03-23 MED ORDER — ENOXAPARIN SODIUM 40 MG/0.4ML IJ SOSY
40.0000 mg | PREFILLED_SYRINGE | INTRAMUSCULAR | Status: DC
  Administered 2022-03-23 – 2022-03-24 (×2): 40 mg via SUBCUTANEOUS
  Filled 2022-03-23 (×2): qty 0.4

## 2022-03-23 MED ORDER — ACETAMINOPHEN 325 MG PO TABS
650.0000 mg | ORAL_TABLET | Freq: Four times a day (QID) | ORAL | Status: DC | PRN
  Administered 2022-03-23: 650 mg via ORAL
  Filled 2022-03-23: qty 2

## 2022-03-23 MED ORDER — ONDANSETRON HCL 4 MG/2ML IJ SOLN
4.0000 mg | Freq: Once | INTRAMUSCULAR | Status: AC
  Administered 2022-03-23: 4 mg via INTRAVENOUS
  Filled 2022-03-23: qty 2

## 2022-03-23 MED ORDER — ONDANSETRON HCL 4 MG/2ML IJ SOLN
4.0000 mg | Freq: Four times a day (QID) | INTRAMUSCULAR | Status: DC | PRN
  Administered 2022-03-23 – 2022-03-26 (×5): 4 mg via INTRAVENOUS
  Filled 2022-03-23 (×5): qty 2

## 2022-03-23 MED ORDER — SODIUM CHLORIDE 0.9 % IV SOLN
1.0000 g | INTRAVENOUS | Status: DC
  Administered 2022-03-23 – 2022-03-25 (×3): 1 g via INTRAVENOUS
  Filled 2022-03-23 (×4): qty 10

## 2022-03-23 NOTE — Progress Notes (Signed)
Echo tech at bedside. Jordan Corona [name]

## 2022-03-23 NOTE — Progress Notes (Signed)
ANTICOAGULATION CONSULT NOTE - Initial Consult  Pharmacy Consult for Lovenox Indication: atrial fibrillation  Allergies  Allergen Reactions   Cardura [Doxazosin Mesylate] Hives   Cardura [Doxazosin]    Strawberry Extract     strawberries   Barbiturates Nausea And Vomiting and Rash   Levofloxacin Rash   Sulfa Antibiotics Nausea Only and Rash    Patient Measurements: Height: '5\' 2"'$  (157.5 cm) Weight: 45.4 kg (100 lb) IBW/kg (Calculated) : 50.1  Vital Signs: Temp: 98 F (36.7 C) (07/16 1300) Temp Source: Oral (07/16 1300) BP: 136/88 (07/16 1300) Pulse Rate: 94 (07/16 1300)  Labs: Recent Labs    03/23/22 0938 03/23/22 1040 03/23/22 1201  HGB 9.9* 9.8*  --   HCT 30.5* 30.8*  --   PLT 274 260  --   CREATININE 1.26*  --   --   TROPONINIHS  --   --  4    Estimated Creatinine Clearance: 27.2 mL/min (A) (by C-G formula based on SCr of 1.26 mg/dL (H)).   Medical History: Past Medical History:  Diagnosis Date   Allergy    seasonal   Anemia    Arthritis    Asthma    uses Advair and Spiriva daily,Albuterol prn.Takes Singulair at bedtime and Prednisone daily   Back spasm    takes Zanaflex daily as needed   Blood transfusion 2007   no abnormal reation noted    Bruises easily    d/t being on Prednisone   Bursitis    left elbow   C. difficile colitis    2007: treated with Flagyl and Vanc, 2014: treated with vanc, Aug 2014: Vanc taper   Chronic back pain    Chronic kidney disease    "creatine" creeping up - followed at this time by PCP   Complication of anesthesia 2000   woke up during one surgery- ovary surgery   COPD (chronic obstructive pulmonary disease) ([place])    Depression    takes Prozac daily   Dysphasia    Falls    H/O hiatal hernia    Hemorrhoids    History of bronchitis    last time a yr ago   History of colon polyps    History of kidney stones    History of kidney stones    Hyperlipidemia    hx of-was on meds but has been off x 1 1/2 yrs    Hypertension    takes Verapamil nightly   IBS (irritable bowel syndrome)    Insomnia    takes Benadryl as needed   Myocardial infarction [facility]) 2007   Takotsubo cardiomyopathy   Neurogenic bowel    Neuromuscular dysfunction of bladder, unspecified    Nocturia    Osteoporosis    takes Fosamax weekly   Pneumonia    MRSA pneumonia in 2007   Shortness of breath    with exertion daily   Stroke [facility])    "they say I has a small stroke".No deficits   Tingling    and pain in left leg   Urinary frequency     Medications:  (Not in a hospital admission)   Assessment: New onset atrial fibrillation with RVR -Check TSH and 2D echocardiogram -IV diltiazem for heart rate control -CHA2DS2-VASc score of 4 or greater, will start on full dose Lovenox for anticoagulation -Appreciate cardiology evaluation in a.m. -Continue home metoprolol Goal of Therapy:  Anti-Xa level 0.6-1 units/ml 4hrs after LMWH dose given Monitor platelets by anticoagulation protocol: Yes   Plan:  Lovenox '1mg'$ /Kg  SQ q24hrs Monitor for s/sx bleeding complications  Hart Robinsons A 03/23/2022,1:49 PM

## 2022-03-23 NOTE — H&P (Addendum)
History and Physical    PRISEIS CRATTY ZHY:[id number] DOB: [birth year] DOA: 03/23/2022  PCP: Caprice Renshaw, MD   Patient coming from: SNF  Chief Complaint: Nausea/dyspnea/abdominal pain  HPI: Jordan [name] is a 76 y.o. female with medical history significant for neuromuscular dysfunction of bladder and colon, CKD stage IIIb, hypertension, dyslipidemia, IBS, COPD, prior CVA, depression, osteoporosis, lumbar compression fracture, and hemorrhoids who presented to the ED from her SNF on account of worsening nausea and shortness of breath as well as some abdominal pain.  She has not had any recent fevers or chills, flank pain or new back pain and is uncertain of any dysuria.   ED Course: Vital signs are stable and patient is afebrile.  Leukocytosis of 12,500 noted and hemoglobin 9.8 with creatinine 1.26.  50% L2 compression fracture noted on abdominal imaging with no other acute findings noted.  Patient started initially on IV fluid and then given Lasix for diuresis.  Diltiazem drip ordered for new onset atrial fibrillation.  Patient noted to have UTI on urine analysis and will be started on Rocephin empirically.  Review of Systems: Could not be obtained given patient condition  Past Medical History:  Diagnosis Date   Allergy    seasonal   Anemia    Arthritis    Asthma    uses Advair and Spiriva daily,Albuterol prn.Takes Singulair at bedtime and Prednisone daily   Back spasm    takes Zanaflex daily as needed   Blood transfusion 2007   no abnormal reation noted    Bruises easily    d/t being on Prednisone   Bursitis    left elbow   C. difficile colitis    2007: treated with Flagyl and Vanc, 2014: treated with vanc, Aug 2014: Vanc taper   Chronic back pain    Chronic kidney disease    "creatine" creeping up - followed at this time by PCP   Complication of anesthesia 2000   woke up during one surgery- ovary surgery   COPD (chronic obstructive pulmonary disease) ([place])    Depression     takes Prozac daily   Dysphasia    Falls    H/O hiatal hernia    Hemorrhoids    History of bronchitis    last time a yr ago   History of colon polyps    History of kidney stones    History of kidney stones    Hyperlipidemia    hx of-was on meds but has been off x 1 1/2 yrs   Hypertension    takes Verapamil nightly   IBS (irritable bowel syndrome)    Insomnia    takes Benadryl as needed   Myocardial infarction [facility]) 2007   Takotsubo cardiomyopathy   Neurogenic bowel    Neuromuscular dysfunction of bladder, unspecified    Nocturia    Osteoporosis    takes Fosamax weekly   Pneumonia    MRSA pneumonia in 2007   Shortness of breath    with exertion daily   Stroke [facility])    "they say I has a small stroke".No deficits   Tingling    and pain in left leg   Urinary frequency     Past Surgical History:  Procedure Laterality Date   APPENDECTOMY     BIOPSY  04/13/2017   Procedure: BIOPSY;  Surgeon: Daneil Dolin, MD;  [facility];  Service: Endoscopy;;  right and left colon    BIOPSY  07/06/2018   Procedure: BIOPSY;  Surgeon: Danie Binder, MD;  [facility]: [facility];  Service: Endoscopy;;  Random Colon   CARPAL TUNNEL RELEASE Left    CARPAL TUNNEL RELEASE Right 09/21/2015   Procedure: CARPAL TUNNEL RELEASE;  Surgeon: Carole Civil, MD;  Location: [facility];  Service: Orthopedics;  Laterality: Right;   cataract surgery Bilateral    CHOLECYSTECTOMY     COLONOSCOPY  09/24/2006   RMR: Normal rectum, left-sided diverticula   COLONOSCOPY  06/2011   Dr. Henrene Pastor: tubular adenomas, moderative diverticulosis, internal hemorrhoids   COLONOSCOPY WITH PROPOFOL N/A 04/13/2017   Dr. Gala Romney: internal hemorrhoids, diverticulosis   CYSTOSCOPY     ENDARTERECTOMY Right 12/12/2015   Procedure: ENDARTERECTOMY RIGHT CAROTID, RESECTION OF REDUNDANT RIGHT COMMON CAROTID ARTERY WITH PRIMARY REANASTOMOSIS;  Surgeon: Mal Misty, MD;  Location: [facility] OR;  Service: Vascular;   Laterality: Right;   ESOPHAGOGASTRODUODENOSCOPY  04/07/2006   NID:POEUMP esophagus s/p placement of Bravo pH probe, some surgical changes but not typical of fundoplication wrap, may have slipped   FLEXIBLE SIGMOIDOSCOPY N/A 07/06/2018   Procedure: FLEXIBLE SIGMOIDOSCOPY WITH PROPOFOL;  Surgeon: Danie Binder, MD;  [facility];  Service: Endoscopy;  Laterality: N/A;  needs to be done first before the other inpatient    GANGLION CYST EXCISION Right 09/21/2015   Procedure: RIGHT WRIST GANGLION CYST REMOVAL;  Surgeon: Carole Civil, MD;  Location: [facility];  Service: Orthopedics;  Laterality: Right;   hemorrhoidal banding     HERNIA REPAIR     umbilical   KNEE ARTHROSCOPY Right    KNEE SURGERY Right    KYPHOPLASTY N/A 02/23/2018   Procedure: KYPHOPLASTY LUMBAR FOUR;  Surgeon: Newman Pies, MD;  Location: [employer];  Service: Neurosurgery;  Laterality: N/A;   LUMBAR LAMINECTOMY/DECOMPRESSION MICRODISCECTOMY Left 10/17/2013   Procedure: LUMBAR LAMINECTOMY/DECOMPRESSION MICRODISCECTOMY 1 LEVEL three/four;  Surgeon: Ophelia Charter, MD;  Location: [place] [facility];  Service: Neurosurgery;  Laterality: Left;   NECK SURGERY     fusion-    NISSEN FUNDOPLICATION     x 2   OLECRANON BURSECTOMY Left 01/11/2014   Procedure: OLECRANON BURSECTOMY;  Surgeon: Carole Civil, MD;  Location: [facility];  Service: Orthopedics;  Laterality: Left;   OOPHORECTOMY Right    PATCH ANGIOPLASTY Right 12/12/2015   Procedure: PATCH ANGIOPLASTY RIGHT CAROTID ARTERY USING HEMASHIELD PLATINUM FINESSE PATCH;  Surgeon: Mal Misty, MD;  Location: [place];  Service: Vascular;  Laterality: Right;   TONSILLECTOMY       reports that she has never smoked. She has never used smokeless tobacco. She reports that she does not currently use alcohol after a past usage of about 10.0 standard drinks of alcohol per week. She reports that she does not use drugs.  Allergies  Allergen Reactions   Cardura [Doxazosin  Mesylate] Hives   Cardura [Doxazosin]    Strawberry Extract     strawberries   Barbiturates Nausea And Vomiting and Rash   Levofloxacin Rash   Sulfa Antibiotics Nausea Only and Rash    Family History  Problem Relation Age of Onset   Heart disease Father    Colon cancer Neg Hx     Prior to Admission medications   Medication Sig Start Date End Date Taking? Authorizing Provider  acetaminophen (TYLENOL) 325 MG tablet Take 650 mg by mouth every 6 (six) hours as needed.     [provider]  albuterol (ACCUNEB) 0.63 MG/3ML nebulizer solution Take 1 ampule by nebulization every 6 (six) hours as needed  for wheezing.    [provider]  albuterol (VENTOLIN HFA) 108 (90 Base) MCG/ACT inhaler Inhale into the lungs every 6 (six) hours as needed for wheezing or shortness of breath.    [provider]  amLODipine (NORVASC) 2.5 MG tablet Take 2.5 mg by mouth daily. 03/03/20   [provider]  colestipol (COLESTID) 1 g tablet Take 2 tablets (2 g total) by mouth daily. 12/31/21   Annitta Needs, NP  diclofenac Sodium (VOLTAREN) 1 % GEL Apply 4 g topically every 8 (eight) hours as needed.    [provider]  dicyclomine (BENTYL) 10 MG capsule Take 1 capsule (10 mg total) by mouth 4 (four) times daily -  before meals and at bedtime. Patient taking differently: Take 10 mg by mouth in the morning, at noon, and at bedtime. 04/04/21   Annitta Needs, NP  Ergocalciferol (VITAMIN D2) 50 MCG (2000 UT) TABS Take 1 tablet by mouth daily.    [provider]  FLUoxetine (PROZAC) 40 MG capsule Take 40 mg by mouth daily.    [provider]  fluticasone furoate-vilanterol (BREO ELLIPTA) 100-25 MCG/INH AEPB Inhale 1 puff into the lungs daily. 11/11/19   [provider]  gabapentin (NEURONTIN) 100 MG capsule Take 100 mg by mouth daily. 03/16/20   [provider]  guaiFENesin (ROBITUSSIN) 100 MG/5ML liquid Take 10 mLs by mouth every 6 (six) hours as  needed for cough or to loosen phlegm.    [provider]  hydrocortisone (ANUSOL-HC) 2.5 % rectal cream Place 1 application rectally 2 (two) times daily as needed for hemorrhoids or anal itching.    [provider]  iron polysaccharides (NIFEREX) 150 MG capsule Take 1 capsule by mouth daily.    [provider]  lidocaine (LIDODERM) 5 % Place 1 patch onto the skin.    [provider]  Melatonin 3 MG TABS Take 6 mg by mouth at bedtime.     [provider]  metoprolol tartrate (LOPRESSOR) 25 MG tablet Take 1 tablet (25 mg total) by mouth 2 (two) times daily. 07/19/18   Cristal Deer, MD  montelukast (SINGULAIR) 10 MG tablet Take 10 mg by mouth daily.    [provider]  ondansetron (ZOFRAN-ODT) 4 MG disintegrating tablet Take 4 mg by mouth every 6 (six) hours as needed for nausea or vomiting.    [provider]  oxyCODONE (OXY IR/ROXICODONE) 5 MG immediate release tablet Take 5 mg by mouth every 4 (four) hours as needed.    [provider]  predniSONE (DELTASONE) 5 MG tablet Take 5 mg by mouth daily. 09/15/21   [provider]  senna-docusate (SENOKOT-S) 8.6-50 MG tablet Take 1 tablet by mouth 2 (two) times daily.    [provider]  umeclidinium bromide (INCRUSE ELLIPTA) 62.5 MCG/ACT AEPB Inhale 1 puff into the lungs daily.    [provider]  vitamin C (ASCORBIC ACID) 500 MG tablet Take 500 mg by mouth daily.    [provider]    Physical Exam: Vitals:   03/23/22 0920 03/23/22 0922 03/23/22 0923 03/23/22 1200  BP:  (!) 149/104  (!) 133/99  Pulse: (!) 113   93  Resp: 20   19  Temp: 97.8 F (36.6 C)     TempSrc: Oral     SpO2: (!) 89%   100%  Height:   '5\' 2"'$  (1.575 m)     Constitutional: NAD, calm, comfortable Vitals:   03/23/22 0920 03/23/22 1517  03/23/22 0923 03/23/22 1200  BP:  (!) 149/104  (!) 133/99  Pulse: (!) 113   93  Resp: 20   19  Temp: 97.8 F (36.6 C)     TempSrc:  Oral     SpO2: (!) 89%   100%  Height:   '5\' 2"'$  (1.575 m)    Eyes: lids and conjunctivae normal Neck: normal, supple Respiratory: Mild bilateral wheezing, on 3 L nasal cannula Cardiovascular: Irregular and tachycardic Abdomen: no tenderness, no distention. Bowel sounds positive.  Musculoskeletal:  No edema. Skin: no rashes, lesions, ulcers.  Psychiatric: Flat affect  Labs on Admission: I have personally reviewed following labs and imaging studies  CBC: Recent Labs  Lab 03/23/22 0938 03/23/22 1040  WBC 13.1* 12.5*  NEUTROABS  --  8.7*  HGB 9.9* 9.8*  HCT 30.5* 30.8*  MCV 91.3 93.3  PLT 274 353   Basic Metabolic Panel: Recent Labs  Lab 03/23/22 0938  NA 137  K 3.9  CL 106  CO2 23  GLUCOSE 170*  BUN 16  CREATININE 1.26*  CALCIUM 8.8*   GFR: CrCl cannot be calculated (Unknown ideal weight.). Liver Function Tests: Recent Labs  Lab 03/23/22 0938  AST 24  ALT 20  ALKPHOS 92  BILITOT 0.7  PROT 6.5  ALBUMIN 3.1*   Recent Labs  Lab 03/23/22 0938  LIPASE 22   No results for input(s): "AMMONIA" in the last 168 hours. Coagulation Profile: No results for input(s): "INR", "PROTIME" in the last 168 hours. Cardiac Enzymes: No results for input(s): "CKTOTAL", "CKMB", "CKMBINDEX", "TROPONINI" in the last 168 hours. BNP (last 3 results) No results for input(s): "PROBNP" in the last 8760 hours. HbA1C: No results for input(s): "HGBA1C" in the last 72 hours. CBG: No results for input(s): "GLUCAP" in the last 168 hours. Lipid Profile: No results for input(s): "CHOL", "HDL", "LDLCALC", "TRIG", "CHOLHDL", "LDLDIRECT" in the last 72 hours. Thyroid Function Tests: No results for input(s): "TSH", "T4TOTAL", "FREET4", "T3FREE", "THYROIDAB" in the last 72 hours. Anemia Panel: No results for input(s): "VITAMINB12", "FOLATE", "FERRITIN", "TIBC", "IRON", "RETICCTPCT" in the last 72 hours. Urine analysis:    Component Value Date/Time   COLORURINE YELLOW 03/23/2022 1200    APPEARANCEUR CLOUDY (A) 03/23/2022 1200   LABSPEC 1.017 03/23/2022 1200   PHURINE 7.0 03/23/2022 1200   GLUCOSEU NEGATIVE 03/23/2022 1200   HGBUR MODERATE (A) 03/23/2022 1200   BILIRUBINUR NEGATIVE 03/23/2022 1200   KETONESUR NEGATIVE 03/23/2022 1200   PROTEINUR 30 (A) 03/23/2022 1200   UROBILINOGEN 0.2 05/20/2007 1717   NITRITE NEGATIVE 03/23/2022 1200   LEUKOCYTESUR LARGE (A) 03/23/2022 1200    Radiological Exams on Admission: CT Abdomen Pelvis W Contrast  Result Date: 03/23/2022 CLINICAL DATA:  76 year old female with acute abdominal and pelvic pain with nausea and vomiting. EXAM: CT ABDOMEN AND PELVIS WITH CONTRAST TECHNIQUE: Multidetector CT imaging of the abdomen and pelvis was performed using the standard protocol following bolus administration of intravenous contrast. RADIATION DOSE REDUCTION: This exam was performed according to the departmental dose-optimization program which includes automated exposure control, adjustment of the mA and/or kV according to patient size and/or use of iterative reconstruction technique. CONTRAST:  52m OMNIPAQUE IOHEXOL 300 MG/ML  SOLN COMPARISON:  11/26/2021 and prior CTs FINDINGS: Lower chest: Trace bilateral pleural effusions are identified. Hepatobiliary: Liver is unchanged with no significant hepatic abnormalities noted. Patient is status post cholecystectomy. There is no evidence of intrahepatic or extrahepatic biliary dilatation. Pancreas: Unremarkable except for mild atrophy Spleen: Unremarkable Adrenals/Urinary Tract: A 5  mm calculus within the LEFT renal pelvis has migrated from the UPPER pole since the prior study. There is no evidence of hydronephrosis. 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 Bilateral renal atrophy and cortical thinning again noted. A punctate nonobstructing mid LEFT renal calculus is present. No obstructing urinary calculi are identified. Stomach/Bowel: Stomach is within normal limits.  No evidence of bowel wall thickening, distention, or inflammatory changes. Vascular/Lymphatic: Aortic atherosclerosis. No enlarged abdominal or pelvic lymph nodes. Reproductive: No significant abnormality. Other: No ascites, focal collection or pneumoperitoneum. Musculoskeletal: A new 50% compression fracture of the L2 SUPERIOR endplate is noted since 11/26/2021, with 2 mm bony retropulsion. Compression fractures of T10, T12, L1 and L4 with vertebral augmentation changes in L4 again identified. Multilevel degenerative disc disease/spondylosis and facet arthropathy again noted. IMPRESSION: 1. New 50% compression fracture of the L2 SUPERIOR endplate since 36/64/4034, with 2 mm bony retropulsion. 2. 5 mm LEFT renal calculus has migrated into the LEFT renal pelvis since the prior study. No evidence of hydronephrosis. 3. Trace bilateral pleural effusions. 4. Unchanged large RIGHT staghorn calculus and punctate nonobstructing LEFT renal calculus. 5. Aortic Atherosclerosis (ICD10-I70.0). Electronically Signed   By: Margarette Canada M.D.   On: 03/23/2022 11:35   DG Chest Port 1 View  Result Date: 03/23/2022 CLINICAL DATA:  Nausea, vomiting, and diarrhea. EXAM: PORTABLE CHEST 1 VIEW COMPARISON:  Acute abdominal series 07/04/2018 FINDINGS: Heart is upper limits of normal. Atherosclerotic changes are present at the aortic arch. Slight increase in the diffuse interstitial pattern is noted. No focal airspace consolidation is present. Advanced degenerative changes are noted in the shoulders. IMPRESSION: 1. Slight increase in diffuse interstitial pattern compatible with edema. 2. No focal airspace consolidation. Electronically Signed   By: San Morelle M.D.   On: 03/23/2022 10:44    EKG: Independently reviewed.   Assessment/Plan Principal Problem:   New onset a-fib ([place])    New onset atrial fibrillation with RVR -Check TSH and 2D echocardiogram -IV diltiazem for heart rate control -CHA2DS2-VASc score of 4 or  greater, will start on full dose Lovenox for anticoagulation -Appreciate cardiology evaluation in a.m. -Continue home metoprolol  Acute hypoxemic respiratory failure secondary to pulmonary edema related to above -Continue diuresis with Lasix due to pulmonary edema and BNP elevation -Check 2D echocardiogram -Wean oxygen requirements as tolerated -Strict I's and O's  UTI -Check blood and urine cultures -Start Rocephin empirically  CKD stage IIIb -Currently stable and at baseline, monitor while on Lasix for diuresis  Essential hypertension -Currently with stable blood pressure readings -Continue metoprolol for now and hold amlodipine  COPD -No acute bronchospasms -DuoNebs as needed  New 50% compression fracture in the setting of prior compression fractures/osteoporosis -No complaints of new back pain noted -Continue to monitor  Chronic anemia -Currently stable -Continue to monitor CBC  Neuromuscular dysfunction of bladder and colon -Lives in SNF   DVT prophylaxis: Full dose Lovenox Code Status: Full Family Communication: None at bedside Disposition Plan: Admit for atrial fibrillation with RVR/UTI Consults called: Cardiology in a.m. Admission status: Inpatient, SDU  Severity of Illness: The appropriate patient status for this patient is INPATIENT. Inpatient status is judged to be reasonable and necessary in order to provide the required intensity of service to ensure the patient's safety. The patient's presenting symptoms, physical exam findings, and initial radiographic and laboratory data in the context of their chronic comorbidities is felt to place them at high risk for further clinical deterioration. Furthermore,  it is not anticipated that the patient will be medically stable for discharge from the hospital within 2 midnights of admission.   * I certify that at the point of admission it is my clinical judgment that the patient will require inpatient hospital care  spanning beyond 2 midnights from the point of admission due to high intensity of service, high risk for further deterioration and high frequency of surveillance required.*   Idabell Picking D Shatona Andujar DO [facility]  If 7PM-7AM, please contact night-coverage [email or web address]  03/23/2022, 12:45 PM

## 2022-03-23 NOTE — ED Triage Notes (Addendum)
Pt in via [facility] from [facility], per report the pt c/o n/v/d, pt c/o generalized abd pain, pt rcvd 4 mg Zofran pta, pt denies vomiting, states, "I just feel like I have too." Pt reports x 2 liquid stools in the last 24 hours,pt c/o SOB which is baseline per report before the pt receives her daily Albuterol, pt reported that she did receive albuterol pta, pt sats 89% on RA upon arrival to ED, A&O x4

## 2022-03-23 NOTE — ED Notes (Signed)
Unable to void after 1034m NS bolus completed. Patient c/o weakness and nausea. I&O cath done to collect urine specimen.

## 2022-03-23 NOTE — Progress Notes (Signed)
  Echocardiogram 2D Echocardiogram has been performed.  Jordan [name] 03/23/2022, 5:18 PM

## 2022-03-23 NOTE — ED Provider Notes (Addendum)
[facility] EMERGENCY DEPARTMENT Provider Note   CSN: [id number] Arrival date & time: 03/23/22  8921     History  Chief Complaint  Patient presents with   Emesis    Jordan [name] is a 76 y.o. female with complex medical history of hyperlipidemia, hemorrhoids, osteoporosis, depression, COPD, IBS, HTN, anemia, neurogenic bowel, prior CVA, prior renal calculi, prior MI, CKD, neuromuscular dysfunction.  Presenting today from SNF due to abdominal pain, nausea, and shortness of breath that started earlier this morning.  Denies recent fevers, chills, neck stiffness, flank pain, new back pain, chest pain pressure, or extremity weakness.  Unsure of urinary symptoms.  Notes bowel movements became looser today.  Normally has shortness of breath, but states this worsened this morning.  Was initially 89% on room air.  AAOx4.  Not on anticoagulation.  Took her normal medications this morning, including her albuterol.  Patient is overall difficult historian.  Prior Hx of appendectomy, MI, renal calculi, CVA, C. difficile.  The history is provided by the patient and medical records.  Emesis      Home Medications Prior to Admission medications   Medication Sig Start Date End Date Taking? Authorizing Provider  acetaminophen (TYLENOL) 325 MG tablet Take 650 mg by mouth every 6 (six) hours as needed.     [provider]  albuterol (ACCUNEB) 0.63 MG/3ML nebulizer solution Take 1 ampule by nebulization every 6 (six) hours as needed for wheezing.    [provider]  albuterol (VENTOLIN HFA) 108 (90 Base) MCG/ACT inhaler Inhale into the lungs every 6 (six) hours as needed for wheezing or shortness of breath.    [provider]  amLODipine (NORVASC) 2.5 MG tablet Take 2.5 mg by mouth daily. 03/03/20   [provider]  colestipol (COLESTID) 1 g tablet Take 2 tablets (2 g total) by mouth daily. 12/31/21   Annitta Needs, NP  diclofenac Sodium (VOLTAREN) 1 % GEL Apply 4 g  topically every 8 (eight) hours as needed.    [provider]  dicyclomine (BENTYL) 10 MG capsule Take 1 capsule (10 mg total) by mouth 4 (four) times daily -  before meals and at bedtime. Patient taking differently: Take 10 mg by mouth in the morning, at noon, and at bedtime. 04/04/21   Annitta Needs, NP  Ergocalciferol (VITAMIN D2) 50 MCG (2000 UT) TABS Take 1 tablet by mouth daily.    [provider]  FLUoxetine (PROZAC) 40 MG capsule Take 40 mg by mouth daily.    [provider]  fluticasone furoate-vilanterol (BREO ELLIPTA) 100-25 MCG/INH AEPB Inhale 1 puff into the lungs daily. 11/11/19   [provider]  gabapentin (NEURONTIN) 100 MG capsule Take 100 mg by mouth daily. 03/16/20   [provider]  guaiFENesin (ROBITUSSIN) 100 MG/5ML liquid Take 10 mLs by mouth every 6 (six) hours as needed for cough or to loosen phlegm.    [provider]  hydrocortisone (ANUSOL-HC) 2.5 % rectal cream Place 1 application rectally 2 (two) times daily as needed for hemorrhoids or anal itching.    [provider]  iron polysaccharides (NIFEREX) 150 MG capsule Take 1 capsule by mouth daily.    [provider]  lidocaine (LIDODERM) 5 % Place 1 patch onto the skin.    [provider]  Melatonin 3 MG TABS Take 6 mg by mouth at bedtime.     [provider]  metoprolol tartrate (LOPRESSOR) 25 MG tablet Take 1 tablet (25 mg total)  by mouth 2 (two) times daily. 07/19/18   Cristal Deer, MD  montelukast (SINGULAIR) 10 MG tablet Take 10 mg by mouth daily.    [provider]  ondansetron (ZOFRAN-ODT) 4 MG disintegrating tablet Take 4 mg by mouth every 6 (six) hours as needed for nausea or vomiting.    [provider]  oxyCODONE (OXY IR/ROXICODONE) 5 MG immediate release tablet Take 5 mg by mouth every 4 (four) hours as needed.    [provider]  predniSONE (DELTASONE) 5 MG tablet Take 5 mg by mouth daily.  09/15/21   [provider]  senna-docusate (SENOKOT-S) 8.6-50 MG tablet Take 1 tablet by mouth 2 (two) times daily.    [provider]  umeclidinium bromide (INCRUSE ELLIPTA) 62.5 MCG/ACT AEPB Inhale 1 puff into the lungs daily.    [provider]  vitamin C (ASCORBIC ACID) 500 MG tablet Take 500 mg by mouth daily.    [provider]      Allergies    Cardura [doxazosin mesylate], Cardura [doxazosin], Strawberry extract, Barbiturates, Levofloxacin, and Sulfa antibiotics    Review of Systems   Review of Systems  Gastrointestinal:  Positive for vomiting.    Physical Exam Updated Vital Signs BP (!) 133/99   Pulse 93   Temp 97.8 F (36.6 C) (Oral)   Resp 19   Ht '5\' 2"'$  (1.575 m)   SpO2 100%   BMI 19.02 kg/m  Physical Exam Vitals and nursing note reviewed.  Constitutional:      General: She is not in acute distress.    Appearance: She is well-developed and underweight. She is ill-appearing. She is not diaphoretic.     Comments: Appears clinically dehydrated, poor skin turgor  HENT:     Head: Normocephalic and atraumatic.  Eyes:     General: Lids are normal. Gaze aligned appropriately.     Conjunctiva/sclera: Conjunctivae normal.  Cardiovascular:     Rate and Rhythm: Normal rate. Rhythm irregularly irregular.     Pulses:          Radial pulses are 2+ on the right side and 2+ on the left side.       Dorsalis pedis pulses are 1+ on the right side and 1+ on the left side.     Heart sounds: No murmur heard. Pulmonary:     Effort: Pulmonary effort is normal. Tachypnea (Borderline 19-20) present. No accessory muscle usage or respiratory distress.     Breath sounds: Wheezing and rales present.     Comments: Diffuse inspiratory/expiratory wheezing wheezing and rhonchi bilaterally Abdominal:     General: Abdomen is scaphoid.     Palpations: Abdomen is soft. There is no shifting dullness, mass or pulsatile mass.     Tenderness: There is no abdominal  tenderness.     Hernia: No hernia is present.       Comments: TTP as indicated above, without guarding, obvious mass, rebound tenderness, or CVA tenderness.  Musculoskeletal:        General: No swelling.     Cervical back: Neck supple. No rigidity.     Right lower leg: No edema.     Left lower leg: No edema.     Comments: Overall chronic appearing atrophy, especially of the lower extremities.  Skin:    General: Skin is warm and dry.     Capillary Refill: Capillary refill takes less than 2 seconds.  Neurological:     Mental Status: She is alert and oriented to person, place,  and time.     GCS: GCS eye subscore is 4. GCS verbal subscore is 5. GCS motor subscore is 6.     Cranial Nerves: No dysarthria or facial asymmetry.     Sensory: Sensation is intact.     Motor: No tremor or seizure activity.     Comments: Unable to assess gait due to pre-existing conditions.  Psychiatric:        Mood and Affect: Mood normal.     ED Results / Procedures / Treatments   Labs (all labs ordered are listed, but only abnormal results are displayed) Labs Reviewed  COMPREHENSIVE METABOLIC PANEL - Abnormal; Notable for the following components:      Result Value   Glucose, Bld 170 (*)    Creatinine, Ser 1.26 (*)    Calcium 8.8 (*)    Albumin 3.1 (*)    GFR, Estimated 44 (*)    All other components within normal limits  CBC - Abnormal; Notable for the following components:   WBC 13.1 (*)    RBC 3.34 (*)    Hemoglobin 9.9 (*)    HCT 30.5 (*)    All other components within normal limits  URINALYSIS, ROUTINE W REFLEX MICROSCOPIC - Abnormal; Notable for the following components:   APPearance CLOUDY (*)    Hgb urine dipstick MODERATE (*)    Protein, ur 30 (*)    Leukocytes,Ua LARGE (*)    WBC, UA >50 (*)    All other components within normal limits  BRAIN NATRIURETIC PEPTIDE - Abnormal; Notable for the following components:   B Natriuretic Peptide 580.0 (*)    All other components within normal  limits  CBC WITH DIFFERENTIAL/PLATELET - Abnormal; Notable for the following components:   WBC 12.5 (*)    RBC 3.30 (*)    Hemoglobin 9.8 (*)    HCT 30.8 (*)    Neutro Abs 8.7 (*)    Lymphs Abs 0.5 (*)    Eosinophils Absolute 2.7 (*)    All other components within normal limits  LIPASE, BLOOD  PATHOLOGIST SMEAR REVIEW  TROPONIN I (HIGH SENSITIVITY)    EKG EKG Interpretation  Date/Time:  Sunday March 23 2022 09:25:10 EDT Ventricular Rate:  100 PR Interval:    QRS Duration: 122 QT Interval:  320 QTC Calculation: 413 R Axis:   75 Text Interpretation: Atrial fibrillation Paired ventricular premature complexes IVCD, consider atypical RBBB Confirmed by Milton Ferguson [id number]) on 03/23/2022 12:18:12 PM  Radiology CT Abdomen Pelvis W Contrast  Result Date: 03/23/2022 CLINICAL DATA:  76 year old female with acute abdominal and pelvic pain with nausea and vomiting. EXAM: CT ABDOMEN AND PELVIS WITH CONTRAST TECHNIQUE: Multidetector CT imaging of the abdomen and pelvis was performed using the standard protocol following bolus administration of intravenous contrast. RADIATION DOSE REDUCTION: This exam was performed according to the departmental dose-optimization program which includes automated exposure control, adjustment of the mA and/or kV according to patient size and/or use of iterative reconstruction technique. CONTRAST:  52m OMNIPAQUE IOHEXOL 300 MG/ML  SOLN COMPARISON:  11/26/2021 and prior CTs FINDINGS: Lower chest: Trace bilateral pleural effusions are identified. Hepatobiliary: Liver is unchanged with no significant hepatic abnormalities noted. Patient is status post cholecystectomy. There is no evidence of intrahepatic or extrahepatic biliary dilatation. Pancreas: Unremarkable except for mild atrophy Spleen: Unremarkable Adrenals/Urinary Tract: A 5 mm calculus within the LEFT renal pelvis has migrated from the UPPER pole since the prior study. There is no evidence of hydronephrosis. A  large staghorn calculus  within the RIGHT kidney and renal pelvis is again noted. Bilateral renal atrophy and cortical thinning again noted. A punctate nonobstructing mid LEFT renal calculus is present. No obstructing urinary calculi are identified. Stomach/Bowel: Stomach is within normal limits. No evidence of bowel wall thickening, distention, or inflammatory changes. Vascular/Lymphatic: Aortic atherosclerosis. No enlarged abdominal or pelvic lymph nodes. Reproductive: No significant abnormality. Other: No ascites, focal collection or pneumoperitoneum. Musculoskeletal: A new 50% compression fracture of the L2 SUPERIOR endplate is noted since 11/26/2021, with 2 mm bony retropulsion. Compression fractures of T10, T12, L1 and L4 with vertebral augmentation changes in L4 again identified. Multilevel degenerative disc disease/spondylosis and facet arthropathy again noted. IMPRESSION: 1. New 50% compression fracture of the L2 SUPERIOR endplate since 61/60/7371, with 2 mm bony retropulsion. 2. 5 mm LEFT renal calculus has migrated into the LEFT renal pelvis since the prior study. No evidence of hydronephrosis. 3. Trace bilateral pleural effusions. 4. Unchanged large RIGHT staghorn calculus and punctate nonobstructing LEFT renal calculus. 5. Aortic Atherosclerosis (ICD10-I70.0). Electronically Signed   By: Margarette Canada M.D.   On: 03/23/2022 11:35   DG Chest Port 1 View  Result Date: 03/23/2022 CLINICAL DATA:  Nausea, vomiting, and diarrhea. EXAM: PORTABLE CHEST 1 VIEW COMPARISON:  Acute abdominal series 07/04/2018 FINDINGS: Heart is upper limits of normal. Atherosclerotic changes are present at the aortic arch. Slight increase in the diffuse interstitial pattern is noted. No focal airspace consolidation is present. Advanced degenerative changes are noted in the shoulders. IMPRESSION: 1. Slight increase in diffuse interstitial pattern compatible with edema. 2. No focal airspace consolidation. Electronically Signed   By:  San Morelle M.D.   On: 03/23/2022 10:44    Procedures Procedures    Medications Ordered in ED Medications  diltiazem (CARDIZEM) 125 mg in dextrose 5% 125 mL (1 mg/mL) infusion (has no administration in time range)  furosemide (LASIX) injection 20 mg (has no administration in time range)  sodium chloride 0.9 % bolus 1,000 mL (0 mLs Intravenous Stopped 03/23/22 1204)  ondansetron (ZOFRAN) injection 4 mg (4 mg Intravenous Given 03/23/22 1047)  pantoprazole (PROTONIX) injection 40 mg (40 mg Intravenous Given 03/23/22 1048)  iohexol (OMNIPAQUE) 300 MG/ML solution 80 mL (80 mLs Intravenous Contrast Given 03/23/22 1054)    ED Course/ Medical Decision Making/ A&P                           Medical Decision Making Amount and/or Complexity of Data Reviewed Labs: ordered. Radiology: ordered.  Risk Prescription drug management. Decision regarding hospitalization.   76 y.o. female presents to the ED for concern of Emesis     This involves an extensive number of treatment options, and is a complaint that carries with it a high risk of complications and morbidity.    Past Medical History / Co-morbidities / Social History: Hx of hyperlipidemia, hemorrhoids, osteoporosis, depression, COPD, IBS, HTN, anemia, neurogenic bowel, prior CVA, prior renal calculi, prior MI, CKD, neuromuscular dysfunction Social Determinants of Health include: Elderly  Additional History:  Internal and external records from outside source obtained and reviewed including GI which indicates hx chronic diarrhea, and urology which indicates nephrolithiasis  Lab Tests: I ordered, and personally interpreted labs.  The pertinent results include:   CBC: Borderline elevated WBC 13.1/12.5, with leukocytosis.  Anemia of Hgb 9.9 and HCT 30.5 close to baseline. CMP/BMP: Creatinine 1.26, BUN 16, GFR 44. Lipase 22 UA: Large leukocytes, WBC greater than 50, RBC 11-20.  Suggestive of  moderate UTI. BNP: 580.0, nothing to  compare this to Troponin: Pending  Imaging Studies: I ordered imaging studies including CXR and CT abdomen/pelvis .   I independently visualized and interpreted imaging which showed CXR: Mildly increased diffuse interstitial pattern, suggestive of edema, without focal consolidation CT abdomen pelvis: 1. New 50% compression fracture of the L2 SUPERIOR endplate since 98/92/1194, with 2 mm bony retropulsion.  2. 5 mm LEFT renal calculus has migrated into the LEFT renal pelvis since the prior study. No evidence of hydronephrosis.  3. Trace bilateral pleural effusions.  4. Unchanged large RIGHT staghorn calculus and punctate nonobstructing LEFT renal calculus.  5. Aortic Atherosclerosis I agree with the radiologist interpretation.  Cardiac Monitoring: The patient was maintained on a cardiac monitor.  I personally viewed and interpreted the cardiac monitored which showed an underlying rhythm of: Atrial fibrillation without RVR  ED Course / Critical Interventions: Pt ill-appearing on exam.  Not diaphoretic and in NAD.  Appears clinically dehydrated, overall weak, and underweight.  Complex history as indicated above.  Presenting from SNF due to abdominal pain with nausea and diarrhea as of this morning, without vomiting.  Also with increased shortness of breath.  Afebrile, mild tachycardia, not tachypneic on exam.  Does not meet sepsis criteria at this time.  IVF, zofran, and pantoprazole ordered.   Upon re-evaluation pt remains unchanged.  CXR indicates possible edema, no evidence of pneumonia or pneumothorax.  EKG demonstrates new onset of Afib without RVR.  Satting at 100% on 2L.  Denies urinary symptoms.  BNP elevated at 580.  Troponins ordered.  Pulses strong and equal in all extremities, not hypotensive or overtly hypertensive, less suspicious of AAA dissection at this time.  Significant abdominal pain, plan to proceed with CT abdomen and pelvis for further eval. Borderline elevated WBC with mild  leukocytosis.  Possible evolving sepsis.  CT abdomen and pelvis indicates new compression fracture L2, however patient not complaining of back pain.  Also identified bilateral pleural effusions.  In the setting with elevated BNP may be suggestive of acute CHF, though without evidence of fluid overload in extremities.  No other new radiological findings that clinically correlate to patient's presentation.  Trouble providing urine, plan to in/out cath. Upon reevaluation, pt's condition has worsened.  UA resulted, indicates moderate UTI with hematuria.  Started diltiazem drip for Afib and IV lasix for pleural effusions.  Troponins pending.  Consultation for hospitalist placed. I consulted with Dr. Manuella Ghazi of hospitalist team, and discussed lab and imaging findings as well as pertinent plan - they recommend: admission for further evaluation and treatment  Disposition: Admission for new onset Afib and moderate UTI with possible developing sepsis.    This patient was a shared case encounter with my attending, Dr. Roderic Palau, who directly contributed to the proposed treatment course and cosigned this note including patient's presenting symptoms, physical exam, and planned diagnostics and interventions.  Attending physician stated agreement with plan or made changes to plan which were implemented.     This chart was dictated using voice recognition software.  Despite best efforts to proofread, errors can occur which can change the documentation meaning.   Final Clinical Impression(s) / ED Diagnoses Final diagnoses:  Atrial fibrillation, new onset ([place])  Urinary tract infection with hematuria, site unspecified  Nausea  Bilateral pleural effusion    Rx / DC Orders ED Discharge Orders     None         Prince Rome, PA-C 17/40/81 1310  Prince Rome, PA-C 41/71/27 1313    Milton Ferguson, MD 03/23/22 1659

## 2022-03-24 ENCOUNTER — Inpatient Hospital Stay (HOSPITAL_COMMUNITY): Payer: Medicare Other

## 2022-03-24 DIAGNOSIS — I4891 Unspecified atrial fibrillation: Secondary | ICD-10-CM | POA: Diagnosis not present

## 2022-03-24 LAB — BASIC METABOLIC PANEL
Anion gap: 8 (ref 5–15)
BUN: 17 mg/dL (ref 8–23)
CO2: 25 mmol/L (ref 22–32)
Calcium: 8.7 mg/dL — ABNORMAL LOW (ref 8.9–10.3)
Chloride: 107 mmol/L (ref 98–111)
Creatinine, Ser: 1.32 mg/dL — ABNORMAL HIGH (ref 0.44–1.00)
GFR, Estimated: 42 mL/min — ABNORMAL LOW (ref >60)
Glucose, Bld: 94 mg/dL (ref 70–99)
Potassium: 3.6 mmol/L (ref 3.5–5.1)
Sodium: 140 mmol/L (ref 135–145)

## 2022-03-24 LAB — ECHOCARDIOGRAM COMPLETE
Height: 64 in
S' Lateral: 2.4 cm
Weight: 1492.07 oz

## 2022-03-24 LAB — CBC
HCT: 28.8 % — ABNORMAL LOW (ref 36.0–46.0)
Hemoglobin: 9.3 g/dL — ABNORMAL LOW (ref 12.0–15.0)
MCH: 29.8 pg (ref 26.0–34.0)
MCHC: 32.3 g/dL (ref 30.0–36.0)
MCV: 92.3 fL (ref 80.0–100.0)
Platelets: 253 10*3/uL (ref 150–400)
RBC: 3.12 MIL/uL — ABNORMAL LOW (ref 3.87–5.11)
RDW: 14.1 % (ref 11.5–15.5)
WBC: 7.4 10*3/uL (ref 4.0–10.5)
nRBC: 0 % (ref 0.0–0.2)

## 2022-03-24 LAB — PATHOLOGIST SMEAR REVIEW

## 2022-03-24 LAB — MAGNESIUM: Magnesium: 1.9 mg/dL (ref 1.7–2.4)

## 2022-03-24 MED ORDER — DILTIAZEM HCL 60 MG PO TABS
60.0000 mg | ORAL_TABLET | Freq: Three times a day (TID) | ORAL | Status: AC
Start: 2022-03-24 — End: 2022-03-25
  Administered 2022-03-24 – 2022-03-25 (×6): 60 mg via ORAL
  Filled 2022-03-24 (×6): qty 1

## 2022-03-24 MED ORDER — OXYCODONE HCL 5 MG PO TABS
5.0000 mg | ORAL_TABLET | Freq: Four times a day (QID) | ORAL | Status: DC | PRN
  Administered 2022-03-24 – 2022-03-26 (×3): 5 mg via ORAL
  Filled 2022-03-24 (×3): qty 1

## 2022-03-24 MED ORDER — LEVALBUTEROL HCL 0.63 MG/3ML IN NEBU: 0.6300 mg | INHALATION_SOLUTION | Freq: Four times a day (QID) | RESPIRATORY_TRACT | Status: DC | PRN

## 2022-03-24 NOTE — Evaluation (Signed)
Clinical/Bedside Swallow Evaluation Patient Details  Name: Jordan [name] MRN: [id number] Date of Birth: March 15, 1946  Today's Date: 03/24/2022 Time: SLP Start Time (ACUTE ONLY): 1545 SLP Stop Time (ACUTE ONLY): 1610 SLP Time Calculation (min) (ACUTE ONLY): 25 min  Past Medical History:  Past Medical History:  Diagnosis Date   Allergy    seasonal   Anemia    Arthritis    Asthma    uses Advair and Spiriva daily,Albuterol prn.Takes Singulair at bedtime and Prednisone daily   Back spasm    takes Zanaflex daily as needed   Blood transfusion 2007   no abnormal reation noted    Bruises easily    d/t being on Prednisone   Bursitis    left elbow   C. difficile colitis    2007: treated with Flagyl and Vanc, 2014: treated with vanc, Aug 2014: Vanc taper   Chronic back pain    Chronic kidney disease    "creatine" creeping up - followed at this time by PCP   Complication of anesthesia 2000   woke up during one surgery- ovary surgery   COPD (chronic obstructive pulmonary disease) ([place])    Depression    takes Prozac daily   Dysphasia    Falls    H/O hiatal hernia    Hemorrhoids    History of bronchitis    last time a yr ago   History of colon polyps    History of kidney stones    History of kidney stones    Hyperlipidemia    hx of-was on meds but has been off x 1 1/2 yrs   Hypertension    takes Verapamil nightly   IBS (irritable bowel syndrome)    Insomnia    takes Benadryl as needed   Myocardial infarction [facility] [address]) 2007   Takotsubo cardiomyopathy   Neurogenic bowel    Neuromuscular dysfunction of bladder, unspecified    Nocturia    Osteoporosis    takes Fosamax weekly   Pneumonia    MRSA pneumonia in 2007   Shortness of breath    with exertion daily   Stroke [facility])    "they say I has a small stroke".No deficits   Tingling    and pain in left leg   Urinary frequency    Past Surgical History:  Past Surgical History:  Procedure Laterality Date   APPENDECTOMY      BIOPSY  04/13/2017   Procedure: BIOPSY;  Surgeon: Daneil Dolin, MD;  [facility];  Service: Endoscopy;;  right and left colon    BIOPSY  07/06/2018   Procedure: BIOPSY;  Surgeon: Danie Binder, MD;  [facility];  Service: Endoscopy;;  Random Colon   CARPAL TUNNEL RELEASE Left    CARPAL TUNNEL RELEASE Right 09/21/2015   Procedure: CARPAL TUNNEL RELEASE;  Surgeon: Carole Civil, MD;  Location: [facility];  Service: Orthopedics;  Laterality: Right;   cataract surgery Bilateral    CHOLECYSTECTOMY     COLONOSCOPY  09/24/2006   RMR: Normal rectum, left-sided diverticula   COLONOSCOPY  06/2011   Dr. Henrene Pastor: tubular adenomas, moderative diverticulosis, internal hemorrhoids   COLONOSCOPY WITH PROPOFOL N/A 04/13/2017   Dr. Gala Romney: internal hemorrhoids, diverticulosis   CYSTOSCOPY     ENDARTERECTOMY Right 12/12/2015   Procedure: ENDARTERECTOMY RIGHT CAROTID, RESECTION OF REDUNDANT RIGHT COMMON CAROTID ARTERY WITH PRIMARY REANASTOMOSIS;  Surgeon: Mal Misty, MD;  Location: [place];  Service: Vascular;  Laterality: Right;   ESOPHAGOGASTRODUODENOSCOPY  04/07/2006  [facility]:XNATFT esophagus s/p placement of Bravo pH probe, some surgical changes but not typical of fundoplication wrap, may have slipped   FLEXIBLE SIGMOIDOSCOPY N/A 07/06/2018   Procedure: FLEXIBLE SIGMOIDOSCOPY WITH PROPOFOL;  Surgeon: Danie Binder, MD;  [facility];  Service: Endoscopy;  Laterality: N/A;  needs to be done first before the other inpatient    GANGLION CYST EXCISION Right 09/21/2015   Procedure: RIGHT WRIST GANGLION CYST REMOVAL;  Surgeon: Carole Civil, MD;  Location: [facility];  Service: Orthopedics;  Laterality: Right;   hemorrhoidal banding     HERNIA REPAIR     umbilical   KNEE ARTHROSCOPY Right    KNEE SURGERY Right    KYPHOPLASTY N/A 02/23/2018   Procedure: KYPHOPLASTY LUMBAR FOUR;  Surgeon: Newman Pies, MD;  Location: [place];  Service: Neurosurgery;  Laterality: N/A;    LUMBAR LAMINECTOMY/DECOMPRESSION MICRODISCECTOMY Left 10/17/2013   Procedure: LUMBAR LAMINECTOMY/DECOMPRESSION MICRODISCECTOMY 1 LEVEL three/four;  Surgeon: Ophelia Charter, MD;  Location: [place] [facility];  Service: Neurosurgery;  Laterality: Left;   NECK SURGERY     fusion-    NISSEN FUNDOPLICATION     x 2   OLECRANON BURSECTOMY Left 01/11/2014   Procedure: OLECRANON BURSECTOMY;  Surgeon: Carole Civil, MD;  [facility];  Service: Orthopedics;  Laterality: Left;   OOPHORECTOMY Right    PATCH ANGIOPLASTY Right 12/12/2015   Procedure: PATCH ANGIOPLASTY RIGHT CAROTID ARTERY USING HEMASHIELD PLATINUM FINESSE PATCH;  Surgeon: Mal Misty, MD;  Location: [place];  Service: Vascular;  Laterality: Right;   TONSILLECTOMY     HPI:  Jordan [name] is a 76 y.o. female with medical history significant for neuromuscular dysfunction of bladder and colon, CKD stage IIIb, hypertension, dyslipidemia, IBS, COPD, prior CVA, depression, osteoporosis, lumbar compression fracture, and hemorrhoids who presented to the ED from her SNF on account of worsening nausea and shortness of breath as well as some abdominal pain.  Patient was admitted with new onset paroxysmal atrial fibrillation with RVR with associated acute hypoxemic respiratory failure as well as UTI. BSE requested.    Assessment / Plan / Recommendation  Clinical Impression  BSE completed at bedside. Pt presents with hoarse, weak vocal quality. Oral completed and Pt with dry oral mucosa, improved after ice chips. Pt assessed with ice chips, water, thickened juice, puree, and mech soft textures. Pt with reduced labial seal with cup sips at times and needs assist for feeding. She presented with coughing after cup and straw sips with thins and occasional cough after straw sips NTL. Pt with improved performance with puree and cup sips of NTL. Recommend D2/NTL with pills in puree and SLP to f/u tomorrow for appropriatness of MBSS as needed. Above to  RN. SLP Visit Diagnosis: Dysphagia, unspecified (R13.10)    Aspiration Risk  Moderate aspiration risk    Diet Recommendation Dysphagia 2 (Fine chop);Nectar-thick liquid   Liquid Administration via: Cup;No straw Medication Administration: Whole meds with puree Supervision: Staff to assist with self feeding;Full supervision/cueing for compensatory strategies Compensations: Slow rate;Small sips/bites Postural Changes: Seated upright at 90 degrees;Remain upright for at least 30 minutes after po intake    Other  Recommendations Oral Care Recommendations: Oral care BID;Staff/trained caregiver to provide oral care Other Recommendations: Clarify dietary restrictions;Order thickener from pharmacy    Recommendations for follow up therapy are one component of a multi-disciplinary discharge planning process, led by the attending physician.  Recommendations may be updated based on patient status, additional functional criteria and insurance  authorization.  Follow up Recommendations Skilled nursing-short term rehab (<3 hours/day)      Assistance Recommended at Discharge Frequent or constant Supervision/Assistance  Functional Status Assessment Patient has had a recent decline in their functional status and demonstrates the ability to make significant improvements in function in a reasonable and predictable amount of time.  Frequency and Duration min 2x/week  1 week       Prognosis Prognosis for Safe Diet Advancement: Fair      Swallow Study   General Date of Onset: 03/23/22 HPI: Jordan [name] is a 76 y.o. female with medical history significant for neuromuscular dysfunction of bladder and colon, CKD stage IIIb, hypertension, dyslipidemia, IBS, COPD, prior CVA, depression, osteoporosis, lumbar compression fracture, and hemorrhoids who presented to the ED from her SNF on account of worsening nausea and shortness of breath as well as some abdominal pain.  Patient was admitted with new onset  paroxysmal atrial fibrillation with RVR with associated acute hypoxemic respiratory failure as well as UTI. BSE requested. Type of Study: Bedside Swallow Evaluation Previous Swallow Assessment: N/A Diet Prior to this Study: Dysphagia 1 (puree);Thin liquids Temperature Spikes Noted: No Respiratory Status: Nasal cannula History of Recent Intubation: No Behavior/Cognition: Alert;Cooperative;Pleasant mood Oral Cavity Assessment: Dry Oral Care Completed by SLP: Yes Vision: Functional for self-feeding Self-Feeding Abilities: Needs assist Patient Positioning: Upright in bed Baseline Vocal Quality: Hoarse;Low vocal intensity Volitional Cough: Congested;Weak Volitional Swallow: Able to elicit    Oral/Motor/Sensory Function Overall Oral Motor/Sensory Function: Within functional limits   Ice Chips Ice chips: Within functional limits Presentation: Spoon   Thin Liquid Thin Liquid: Impaired Presentation: Cup;Spoon;Straw Oral Phase Impairments: Reduced labial seal Oral Phase Functional Implications: Right anterior spillage Pharyngeal  Phase Impairments: Suspected delayed Swallow;Decreased hyoid-laryngeal movement;Cough - Immediate    Nectar Thick Nectar Thick Liquid: Impaired Presentation: Straw;Cup Oral Phase Impairments: Reduced lingual movement/coordination Pharyngeal Phase Impairments: Cough - Immediate (better with cup sip)   Honey Thick Honey Thick Liquid: Not tested   Puree Puree: Within functional limits Presentation: Spoon   Solid     Solid: Impaired Presentation: Spoon Oral Phase Impairments: Reduced lingual movement/coordination;Impaired mastication Oral Phase Functional Implications: Prolonged oral transit     Thank you,  Genene Churn, [place]  Aanya Haynes 03/24/2022,4:16 PM

## 2022-03-24 NOTE — Progress Notes (Signed)
Dasia, nurse with  with [facility] calling to see what patient's admission diagnosis is. She was informed that patient is admitted for new onset afib. Bryson Corona [name]

## 2022-03-24 NOTE — Progress Notes (Signed)
Cardiology at bedside. Jordan Corona [name]

## 2022-03-24 NOTE — Progress Notes (Signed)
PROGRESS NOTE    Jordan [name]  NGE:[id number] DOB: 1945-10-20 DOA: 03/23/2022 PCP: Caprice Renshaw, MD   Brief Narrative:    Jordan [name] is a 76 y.o. female with medical history significant for neuromuscular dysfunction of bladder and colon, CKD stage IIIb, hypertension, dyslipidemia, IBS, COPD, prior CVA, depression, osteoporosis, lumbar compression fracture, and hemorrhoids who presented to the ED from her SNF on account of worsening nausea and shortness of breath as well as some abdominal pain.  Patient was admitted with new onset paroxysmal atrial fibrillation with RVR with associated acute hypoxemic respiratory failure as well as UTI.  Assessment & Plan:   Principal Problem:   New onset a-fib ([place])  Assessment and Plan:   New onset paroxysmal atrial fibrillation with RVR -TSH one-point eight 2D echocardiogram without any significant abnormalities LVEF 60-65% -IV diltiazem changed to oral diltiazem per cardiology -CHA2DS2-VASc score of 4 or greater, will start on full dose Lovenox for anticoagulation -Continue home metoprolol -Okay for transfer to telemetry -Appreciate cardiology recommendations with plans to discharge on Eliquis   Acute hypoxemic respiratory failure secondary to pulmonary edema related to above -Hold further diuresis  -Wean oxygen as tolerated -Strict I's and O's   UTI -Check blood and urine cultures, pending -Start Rocephin empirically   CKD stage IIIb -Currently stable and at baseline, monitor while on Lasix for diuresis   Essential hypertension -Currently with stable blood pressure readings -Continue metoprolol for now and hold amlodipine   COPD -No acute bronchospasms -DuoNebs as needed   New 50% compression fracture in the setting of prior compression fractures/osteoporosis -No complaints of new back pain noted -Continue to monitor   Chronic anemia -Currently stable -Continue to monitor CBC   Neuromuscular dysfunction of bladder and  colon -Lives in SNF  Dysphagia -Dysphagia diet and SLP evaluation  DVT prophylaxis: Full dose Lovenox Code Status: Full Family Communication: None at bedside Disposition Plan:  Status is: Inpatient Remains inpatient appropriate because: Need for IV medications.  Consultants:  Cardiology  Procedures:  None  Antimicrobials:  Anti-infectives (From admission, onward)    Start     Dose/Rate Route Frequency Ordered Stop   03/23/22 1330  cefTRIAXone (ROCEPHIN) 1 g in sodium chloride 0.9 % 100 mL IVPB        1 g 200 mL/hr over 30 Minutes Intravenous Every 24 hours 03/23/22 1242         Subjective: Patient seen and evaluated today with no new acute complaints or concerns. No acute concerns or events noted overnight.  She has had some difficulty with swallowing with some congestion and coughing.  Objective: Vitals:   03/24/22 0925 03/24/22 1000 03/24/22 1025 03/24/22 1118  BP: (!) 109/57 122/70    Pulse:  84 79 93  Resp: '19 19 20 19  '$ Temp:    98 F (36.7 C)  TempSrc:    Oral  SpO2: 97% 94% 96% 98%  Weight:      Height:        Intake/Output Summary (Last 24 hours) at 03/24/2022 1214 Last data filed at 03/24/2022 1117 Gross per 24 hour  Intake 276.34 ml  Output 1900 ml  Net -1623.66 ml   Filed Weights   03/23/22 1334 03/23/22 1450  Weight: 45.4 kg 42.3 kg    Examination:  General exam: Appears calm and comfortable  Respiratory system: Clear to auscultation. Respiratory effort normal.  2 L nasal cannula Cardiovascular system: S1 & S2 heard, irregular Gastrointestinal system: Abdomen is soft Central nervous  system: Alert and awake Extremities: No edema Skin: No significant lesions noted Psychiatry: Flat affect.    Data Reviewed: I have personally reviewed following labs and imaging studies  CBC: Recent Labs  Lab 03/23/22 0938 03/23/22 1040 03/24/22 0359  WBC 13.1* 12.5* 7.4  NEUTROABS  --  8.7*  --   HGB 9.9* 9.8* 9.3*  HCT 30.5* 30.8* 28.8*  MCV  91.3 93.3 92.3  PLT 274 260 259   Basic Metabolic Panel: Recent Labs  Lab 03/23/22 0938 03/24/22 0359  NA 137 140  K 3.9 3.6  CL 106 107  CO2 23 25  GLUCOSE 170* 94  BUN 16 17  CREATININE 1.26* 1.32*  CALCIUM 8.8* 8.7*  MG  --  1.9   GFR: Estimated Creatinine Clearance: 24.2 mL/min (A) (by C-G formula based on SCr of 1.32 mg/dL (H)). Liver Function Tests: Recent Labs  Lab 03/23/22 0938  AST 24  ALT 20  ALKPHOS 92  BILITOT 0.7  PROT 6.5  ALBUMIN 3.1*   Recent Labs  Lab 03/23/22 0938  LIPASE 22   No results for input(s): "AMMONIA" in the last 168 hours. Coagulation Profile: No results for input(s): "INR", "PROTIME" in the last 168 hours. Cardiac Enzymes: No results for input(s): "CKTOTAL", "CKMB", "CKMBINDEX", "TROPONINI" in the last 168 hours. BNP (last 3 results) No results for input(s): "PROBNP" in the last 8760 hours. HbA1C: No results for input(s): "HGBA1C" in the last 72 hours. CBG: No results for input(s): "GLUCAP" in the last 168 hours. Lipid Profile: No results for input(s): "CHOL", "HDL", "LDLCALC", "TRIG", "CHOLHDL", "LDLDIRECT" in the last 72 hours. Thyroid Function Tests: Recent Labs    03/23/22 1340  TSH 1.804   Anemia Panel: No results for input(s): "VITAMINB12", "FOLATE", "FERRITIN", "TIBC", "IRON", "RETICCTPCT" in the last 72 hours. Sepsis Labs: No results for input(s): "PROCALCITON", "LATICACIDVEN" in the last 168 hours.  Recent Results (from the past 240 hour(s))  Culture, blood (Routine X 2) w Reflex to ID Panel     Status: None (Preliminary result)   Collection Time: 03/23/22  1:30 PM   Specimen: BLOOD RIGHT WRIST  Result Value Ref Range Status   Specimen Description   Final    BLOOD RIGHT WRIST BOTTLES DRAWN AEROBIC AND ANAEROBIC   Special Requests Blood Culture adequate volume  Final   Culture   Final    NO GROWTH < 12 HOURS Performed at [facility], [address], [place], [place] [address]    Report Status PENDING   Incomplete  Culture, blood (Routine X 2) w Reflex to ID Panel     Status: None (Preliminary result)   Collection Time: 03/23/22  1:40 PM   Specimen: Right Antecubital; Blood  Result Value Ref Range Status   Specimen Description   Final    RIGHT ANTECUBITAL BOTTLES DRAWN AEROBIC AND ANAEROBIC   Special Requests   Final    Blood Culture results may not be optimal due to an excessive volume of blood received in culture bottles   Culture   Final    NO GROWTH < 12 HOURS Performed at [facility], [address], [place],  [address]    Report Status PENDING  Incomplete  MRSA Next Gen by PCR, Nasal     Status: None   Collection Time: 03/23/22  2:41 PM   Specimen: Nasal Mucosa; Nasal Swab  Result Value Ref Range Status   MRSA by PCR Next Gen NOT DETECTED NOT DETECTED Final    Comment: (NOTE) The  GeneXpert MRSA Assay (FDA approved for NASAL specimens only), is one component of a comprehensive MRSA colonization surveillance program. It is not intended to diagnose MRSA infection nor to guide or monitor treatment for MRSA infections. Test performance is not FDA approved in patients less than 61 years old. Performed at [facility], [address] [place], [place] [address]          Radiology Studies: ECHOCARDIOGRAM COMPLETE  Result Date: 03/24/2022    ECHOCARDIOGRAM REPORT   Patient Name:   Jordan [name] Date of Exam: 03/23/2022 Medical Rec #:  [id number]     Height:       64.0 in Accession #:    [id number]    Weight:       93.3 lb Date of Birth:  Oct 22, 1945    BSA:          1.415 m Patient Age:    24 years      BP:           129/69 mmHg Patient Gender: F             HR:           87 bpm. Exam Location:  Inpatient Procedure: 2D Echo Indications:    atrial fibrillation  History:        Patient has prior history of Echocardiogram examinations, most                 recent 05/11/2009. History of Tak[place]ubo, COPD and chronic kidney                 disease; Risk Factors:Hypertension.   Sonographer:    Johny Chess RDCS Referring Phys: [id number] [facility]  Sonographer Comments: Technically difficult study due to poor echo windows. Image acquisition challenging due to respiratory motion. IMPRESSIONS  1. Cannot completely fully assess wall motion with technical challenges. Left ventricular ejection fraction, by estimation, is 60 to 65%. The left ventricle has normal function. The left ventricle has no regional wall motion abnormalities. Left ventricular diastolic parameters are indeterminate.  2. Right ventricular systolic function is normal. The right ventricular size is normal. There is normal pulmonary artery systolic pressure.  3. Left atrial size was mildly dilated.  4. Right atrial size was mildly dilated.  5. Posteriorly directed jet. The mitral valve was not well visualized. Mild mitral valve regurgitation.  6. The aortic valve is normal in structure. There is mild calcification of the aortic valve. Aortic valve regurgitation is not visualized.  7. The inferior vena cava is normal in size with greater than 50% respiratory variability, suggesting right atrial pressure of 3 mmHg.  8. Cannot exclude a small PFO. FINDINGS  Left Ventricle: Cannot completely fully assess wall motion with technical challenges. Left ventricular ejection fraction, by estimation, is 60 to 65%. The left ventricle has normal function. The left ventricle has no regional wall motion abnormalities. The left ventricular internal cavity size was normal in size. There is no left ventricular hypertrophy. Left ventricular diastolic parameters are indeterminate. Right Ventricle: The right ventricular size is normal. No increase in right ventricular wall thickness. Right ventricular systolic function is normal. There is normal pulmonary artery systolic pressure. The tricuspid regurgitant velocity is 2.81 m/s, and  with an assumed right atrial pressure of 3 mmHg, the estimated right ventricular systolic pressure is 32.4  mmHg. Left Atrium: Left atrial size was mildly dilated. Right Atrium: Right atrial size was mildly dilated. Prominent Chiari network. Pericardium: There is no evidence  of pericardial effusion. Mitral Valve: Posteriorly directed jet. The mitral valve was not well visualized. Mild mitral valve regurgitation. Tricuspid Valve: The tricuspid valve is normal in structure. Tricuspid valve regurgitation is mild. Aortic Valve: The aortic valve is normal in structure. There is mild calcification of the aortic valve. Aortic valve regurgitation is not visualized. Pulmonic Valve: The pulmonic valve was not well visualized. Pulmonic valve regurgitation is not visualized. Aorta: The aortic root and ascending aorta are structurally normal, with no evidence of dilitation. Venous: The inferior vena cava is normal in size with greater than 50% respiratory variability, suggesting right atrial pressure of 3 mmHg. IAS/Shunts: Cannot exclude a small PFO.  LEFT VENTRICLE PLAX 2D LVIDd:         3.60 cm LVIDs:         2.40 cm LV PW:         0.90 cm LV IVS:        0.90 cm LVOT diam:     1.60 cm LVOT Area:     2.01 cm  IVC IVC diam: 2.00 cm LEFT ATRIUM           Index        RIGHT ATRIUM           Index LA diam:      3.80 cm 2.69 cm/m   RA Area:     18.80 cm LA Vol (A4C): 37.7 ml 26.65 ml/m  RA Volume:   51.00 ml  36.05 ml/m   AORTA Ao Root diam: 3.10 cm Ao Asc diam:  3.40 cm TRICUSPID VALVE TR Peak grad:   31.6 mmHg TR Vmax:        281.00 cm/s  SHUNTS Systemic Diam: 1.60 cm Phineas Inches Electronically signed by Phineas Inches Signature Date/Time: 03/24/2022/10:52:25 AM    Final    DG CHEST PORT 1 VIEW  Result Date: 03/24/2022 CLINICAL DATA:  Aspiration. EXAM: PORTABLE CHEST 1 VIEW COMPARISON:  03/23/2022 FINDINGS: 5:38 a.m. Similar enlargement of the heart silhouette but with interval near resolution of interstitial edema from the lower zones noted previously. There are trace pleural effusions. The lungs emphysematous with chronic right  suprahilar scar-like opacities but no active pneumonia. The aorta is tortuous with atherosclerosis. There is osteopenia. Advanced asymmetric left shoulder DJD. IMPRESSION: Near-resolution of prior interstitial edema. Chronic change. Minimal pleural effusions. Electronically Signed   By: Telford Nab M.D.   On: 03/24/2022 07:48   CT Abdomen Pelvis W Contrast  Result Date: 03/23/2022 CLINICAL DATA:  76 year old female with acute abdominal and pelvic pain with nausea and vomiting. EXAM: CT ABDOMEN AND PELVIS WITH CONTRAST TECHNIQUE: Multidetector CT imaging of the abdomen and pelvis was performed using the standard protocol following bolus administration of intravenous contrast. RADIATION DOSE REDUCTION: This exam was performed according to the departmental dose-optimization program which includes automated exposure control, adjustment of the mA and/or kV according to patient size and/or use of iterative reconstruction technique. CONTRAST:  9m OMNIPAQUE IOHEXOL 300 MG/ML  SOLN COMPARISON:  11/26/2021 and prior CTs FINDINGS: Lower chest: Trace bilateral pleural effusions are identified. Hepatobiliary: Liver is unchanged with no significant hepatic abnormalities noted. Patient is status post cholecystectomy. There is no evidence of intrahepatic or extrahepatic biliary dilatation. Pancreas: Unremarkable except for mild atrophy Spleen: Unremarkable Adrenals/Urinary Tract: A 5 mm calculus within the LEFT renal pelvis has migrated from the UPPER pole since the prior study. There is no evidence of hydronephrosis. A large staghorn calculus within the RIGHT kidney and renal pelvis  is again noted. Bilateral renal atrophy and cortical thinning again noted. A punctate nonobstructing mid LEFT renal calculus is present. No obstructing urinary calculi are identified. Stomach/Bowel: Stomach is within normal limits. No evidence of bowel wall thickening, distention, or inflammatory changes. Vascular/Lymphatic: Aortic  atherosclerosis. No enlarged abdominal or pelvic lymph nodes. Reproductive: No significant abnormality. Other: No ascites, focal collection or pneumoperitoneum. Musculoskeletal: A new 50% compression fracture of the L2 SUPERIOR endplate is noted since 11/26/2021, with 2 mm bony retropulsion. Compression fractures of T10, T12, L1 and L4 with vertebral augmentation changes in L4 again identified. Multilevel degenerative disc disease/spondylosis and facet arthropathy again noted. IMPRESSION: 1. New 50% compression fracture of the L2 SUPERIOR endplate since 32/95/1884, with 2 mm bony retropulsion. 2. 5 mm LEFT renal calculus has migrated into the LEFT renal pelvis since the prior study. No evidence of hydronephrosis. 3. Trace bilateral pleural effusions. 4. Unchanged large RIGHT staghorn calculus and punctate nonobstructing LEFT renal calculus. 5. Aortic Atherosclerosis (ICD10-I70.0). Electronically Signed   By: Margarette Canada M.D.   On: 03/23/2022 11:35   DG Chest Port 1 View  Result Date: 03/23/2022 CLINICAL DATA:  Nausea, vomiting, and diarrhea. EXAM: PORTABLE CHEST 1 VIEW COMPARISON:  Acute abdominal series 07/04/2018 FINDINGS: Heart is upper limits of normal. Atherosclerotic changes are present at the aortic arch. Slight increase in the diffuse interstitial pattern is noted. No focal airspace consolidation is present. Advanced degenerative changes are noted in the shoulders. IMPRESSION: 1. Slight increase in diffuse interstitial pattern compatible with edema. 2. No focal airspace consolidation. Electronically Signed   By: San Morelle M.D.   On: 03/23/2022 10:44        Scheduled Meds:  Chlorhexidine Gluconate Cloth  6 each Topical Daily   diltiazem  60 mg Oral Q8H   enoxaparin (LOVENOX) injection  40 mg Subcutaneous Q24H   FLUoxetine  40 mg Oral Daily   gabapentin  100 mg Oral Daily   melatonin  6 mg Oral QHS   metoprolol tartrate  25 mg Oral BID   montelukast  10 mg Oral Daily   sodium  chloride flush  3 mL Intravenous Q12H   Continuous Infusions:  sodium chloride     cefTRIAXone (ROCEPHIN)  IV Stopped (03/23/22 1451)     LOS: 1 day    Time spent: 35 minutes    Everado Pillsbury Darleen Crocker, DO [facility]  If 7PM-7AM, please contact night-coverage [email or web address] 03/24/2022, 12:14 PM

## 2022-03-24 NOTE — TOC Initial Note (Signed)
Transition of Care [facility]) - Initial/Assessment Note    Patient Details  Name: Jordan [name] MRN: [id number] Date of Birth: 1946/05/31  Transition of Care [facility]) CM/SW Contact:    Iona Beard, [place] Phone Number: 03/24/2022, 10:33 AM  Clinical Narrative:                 TOC noted per chart review pt arrived from [facility]. CSW spoke to [place] in admissions who states that pt is a LTC resident at their facility. Plan will be for return at D/C. TOC to follow.   Expected Discharge Plan: Long Term Nursing Home Barriers to Discharge: Continued Medical Work up   Patient Goals and CMS Choice Patient states their goals for this hospitalization and ongoing recovery are:: return to LTC CMS Medicare.gov Compare Post Acute Care list provided to:: Patient Choice offered to / list presented to : Patient  Expected Discharge Plan and Services Expected Discharge Plan: [place] Acute Care Choice: Nursing Home Living arrangements for the past 2 months: [place]                                      Prior Living Arrangements/Services Living arrangements for the past 2 months: [place] Lives with:: Facility Resident Patient language and need for interpreter reviewed:: Yes Do you feel safe going back to the place where you live?: Yes      Need for Family Participation in Patient Care: Yes (Comment) Care giver support system in place?: Yes (comment)   Criminal Activity/Legal Involvement Pertinent to Current Situation/Hospitalization: No - Comment as needed  Activities of Daily Living Home Assistive Devices/Equipment: None ADL Screening (condition at time of admission) Patient's cognitive ability adequate to safely complete daily activities?: Yes Is the patient deaf or have difficulty hearing?: No Does the patient have difficulty seeing, even when wearing glasses/contacts?: No Does the patient have difficulty concentrating,  remembering, or making decisions?: Yes Patient able to express need for assistance with ADLs?: Yes Does the patient have difficulty dressing or bathing?: Yes Independently performs ADLs?: No Communication: Independent Dressing (OT): Dependent Is this a change from baseline?: Pre-admission baseline Grooming: Dependent Is this a change from baseline?: Pre-admission baseline Feeding: Independent Bathing: Dependent Is this a change from baseline?: Pre-admission baseline Toileting: Dependent Is this a change from baseline?: Pre-admission baseline In/Out Bed: Dependent Is this a change from baseline?: Pre-admission baseline Walks in Home: Dependent Is this a change from baseline?: Pre-admission baseline Does the patient have difficulty walking or climbing stairs?: Yes Weakness of Legs: Both Weakness of Arms/Hands: Both  Permission Sought/Granted                  Emotional Assessment         Alcohol / Substance Use: Not Applicable Psych Involvement: No (comment)  Admission diagnosis:  Nausea [R11.0] New onset a-fib ([place]) [I48.91] Bilateral pleural effusion [J90] Atrial fibrillation, new onset ([place]) [I48.91] Urinary tract infection with hematuria, site unspecified [N39.0, R31.9] Patient Active Problem List   Diagnosis Date Noted   New onset a-fib ([place]) 03/23/2022   Prolapsed internal hemorrhoids, grade 3 04/04/2021   PAT (paroxysmal atrial tachycardia) ([place]) 02/23/2020   Nephrolithiasis 02/23/2020   Sinusitis 02/23/2020   Aftercare following right knee joint replacement surgery 12/01/2018   GERD (gastroesophageal reflux disease) 11/18/2018   CKD (chronic kidney disease) stage 3,  GFR 30-59 ml/min (HCC) 11/17/2018   IBS (irritable bowel syndrome) 11/17/2018   Ileus ([place])    Clostridium difficile colitis    Paralytic ileus of large intestine ([place])    Pressure injury of skin 07/09/2018   Protein-calorie malnutrition, severe 07/09/2018   Pseudomembranous colitis     Megacolon due to infectious colitis 07/04/2018   AKI (acute kidney injury) ([place]) 07/04/2018   Hypokalemia 07/04/2018   Lumbar compression fracture ([place]) 02/23/2018   S/P cervical spinal fusion 08/27/2017   Depression 06/02/2017   Dysphagia 06/02/2017   Impaired mobility and ADLs 06/02/2017   Neurocognitive deficits 06/02/2017   Neurogenic bladder 06/02/2017   Neurogenic bowel 06/02/2017   Displaced fracture of first cervical vertebra ([employer]) 05/05/2017   Fall 05/05/2017   Anemia 02/20/2017   History of colon polyps 02/20/2017   Chronic diarrhea 02/20/2017   Abnormal weight loss 02/20/2017   Carotid stenosis, symptomatic w/o infarct 01/01/2016   Carotid stenosis 11/27/2015   Ganglion cyst of wrist    Hematoma 01/23/2014   Lumbar foraminal stenosis 10/17/2013   Olecranon bursitis of left elbow 09/14/2013   Left elbow pain 09/14/2013   C. difficile diarrhea 06/20/2013   Loose stools 03/23/2013   TAKOTSUBO SYNDROME 05/04/2009   PALPITATIONS 05/04/2009   Right-sided thoracic back pain 05/04/2009   Essential hypertension 04/24/2009   ASTHMA 04/24/2009   COPD (chronic obstructive pulmonary disease) ([place]) 04/24/2009   Steroid-dependent asthma 04/24/2009   MRSA (methicillin resistant staphylococcus aureus) pneumonia ([place]) 09/05/2005   PCP:  Caprice Renshaw, MD Pharmacy:   [place], [place] [address] [place] [address] Phone: [phone] Fax: [phone]     Social Determinants of Health (SDOH) Interventions    Readmission Risk Interventions    03/24/2022   10:32 AM  Readmission Risk Prevention Plan  Transportation Screening Complete  Home Care Screening Complete  Medication Review (RN CM) Complete

## 2022-03-24 NOTE — Consult Note (Signed)
Cardiology Consultation:   Patient ID: DEA BITTING MRN: [id number]; DOB: Dec 23, 1945  Admit date: 03/23/2022 Date of Consult: 03/24/2022  PCP:  Caprice Renshaw, MD   [facility] Providers Cardiologist:  None        Patient Profile:   Jordan [name] is a 76 y.o. female with a hx of  Jordan [name] is a 76 y.o. female with medical history significant for neuromuscular dysfunction of bladder and colon, CKD stage IIIb, hypertension, dyslipidemia, IBS, COPD, prior CVA, former smoker, depression, osteoporosis, lumbar compression fracture, and hemorrhoids who presented to the ED from her SNF on account of worsening nausea and shortness of breath as well as some abdominal pain. who is being seen 03/24/2022 for the evaluation of atrial fibrillation at the request of Dr. Manuella Ghazi.  History of Present Illness:   Jordan [name] presented with the above symptoms. She had mild white count. Crt at baseline (1.3-1.6). She was diagnosed with a UTI and started on rocephin. She developed new onset atrial fibrillation and started on a diltiazem drip. Otherwise she is HDS. Troponin was negative. EKG shows atrial fibrillation rate 100. She notes her most concerning symptoms were nausea. No LE edema, sob, palpitations or chest pain.  She saw Dr. Domenic Polite in 2010 and was having palpitations managed with verapamil. No diagnosis of afib. She has documented hx of Takotsubo cardiomyopathy; echo and myoview at that time were unremarkable.  Cxray shows mild interstitial edema. Crt 1.26-> 1.32. She's on 2 L O2 weaning  Past Medical History:  Diagnosis Date   Allergy    seasonal   Anemia    Arthritis    Asthma    uses Advair and Spiriva daily,Albuterol prn.Takes Singulair at bedtime and Prednisone daily   Back spasm    takes Zanaflex daily as needed   Blood transfusion 2007   no abnormal reation noted    Bruises easily    d/t being on Prednisone   Bursitis    left elbow   C. difficile colitis    2007: treated with  Flagyl and Vanc, 2014: treated with vanc, Aug 2014: Vanc taper   Chronic back pain    Chronic kidney disease    "creatine" creeping up - followed at this time by PCP   Complication of anesthesia 2000   woke up during one surgery- ovary surgery   COPD (chronic obstructive pulmonary disease) ([place])    Depression    takes Prozac daily   Dysphasia    Falls    H/O hiatal hernia    Hemorrhoids    History of bronchitis    last time a yr ago   History of colon polyps    History of kidney stones    History of kidney stones    Hyperlipidemia    hx of-was on meds but has been off x 1 1/2 yrs   Hypertension    takes Verapamil nightly   IBS (irritable bowel syndrome)    Insomnia    takes Benadryl as needed   Myocardial infarction [facility]) 2007   Takotsubo cardiomyopathy   Neurogenic bowel    Neuromuscular dysfunction of bladder, unspecified    Nocturia    Osteoporosis    takes Fosamax weekly   Pneumonia    MRSA pneumonia in 2007   Shortness of breath    with exertion daily   Stroke [facility])    "they say I has a small stroke".No deficits   Tingling    and pain in left leg  Urinary frequency     Past Surgical History:  Procedure Laterality Date   APPENDECTOMY     BIOPSY  04/13/2017   Procedure: BIOPSY;  Surgeon: Daneil Dolin, MD;  [facility];  Service: Endoscopy;;  right and left colon    BIOPSY  07/06/2018   Procedure: BIOPSY;  Surgeon: Danie Binder, MD;  [facility];  Service: Endoscopy;;  Random Colon   CARPAL TUNNEL RELEASE Left    CARPAL TUNNEL RELEASE Right 09/21/2015   Procedure: CARPAL TUNNEL RELEASE;  Surgeon: Carole Civil, MD;  Location: [facility] OR[facility];  Service: Orthopedics;  Laterality: Right;   cataract surgery Bilateral    CHOLECYSTECTOMY     COLONOSCOPY  09/24/2006   RMR: Normal rectum, left-sided diverticula   COLONOSCOPY  06/2011   Dr. Henrene Pastor: tubular adenomas, moderative diverticulosis, internal hemorrhoids   COLONOSCOPY WITH  PROPOFOL N/A 04/13/2017   Dr. Gala Romney: internal hemorrhoids, diverticulosis   CYSTOSCOPY     ENDARTERECTOMY Right 12/12/2015   Procedure: ENDARTERECTOMY RIGHT CAROTID, RESECTION OF REDUNDANT RIGHT COMMON CAROTID ARTERY WITH PRIMARY REANASTOMOSIS;  Surgeon: Mal Misty, MD;  Location: [facility] OR;  Service: Vascular;  Laterality: Right;   ESOPHAGOGASTRODUODENOSCOPY  04/07/2006   KXF:GHWEXH esophagus s/p placement of Bravo pH probe, some surgical changes but not typical of fundoplication wrap, may have slipped   FLEXIBLE SIGMOIDOSCOPY N/A 07/06/2018   Procedure: FLEXIBLE SIGMOIDOSCOPY WITH PROPOFOL;  Surgeon: Danie Binder, MD;  [facility];  Service: Endoscopy;  Laterality: N/A;  needs to be done first before the other inpatient    GANGLION CYST EXCISION Right 09/21/2015   Procedure: RIGHT WRIST GANGLION CYST REMOVAL;  Surgeon: Carole Civil, MD;  Location: [facility];  Service: Orthopedics;  Laterality: Right;   hemorrhoidal banding     HERNIA REPAIR     umbilical   KNEE ARTHROSCOPY Right    KNEE SURGERY Right    KYPHOPLASTY N/A 02/23/2018   Procedure: KYPHOPLASTY LUMBAR FOUR;  Surgeon: Newman Pies, MD;  Location: [place];  Service: Neurosurgery;  Laterality: N/A;   LUMBAR LAMINECTOMY/DECOMPRESSION MICRODISCECTOMY Left 10/17/2013   Procedure: LUMBAR LAMINECTOMY/DECOMPRESSION MICRODISCECTOMY 1 LEVEL three/four;  Surgeon: Ophelia Charter, MD;  Location: [place] [facility];  Service: Neurosurgery;  Laterality: Left;   NECK SURGERY     fusion-    NISSEN FUNDOPLICATION     x 2   OLECRANON BURSECTOMY Left 01/11/2014   Procedure: OLECRANON BURSECTOMY;  Surgeon: Carole Civil, MD;  Location: [facility];  Service: Orthopedics;  Laterality: Left;   OOPHORECTOMY Right    PATCH ANGIOPLASTY Right 12/12/2015   Procedure: PATCH ANGIOPLASTY RIGHT CAROTID ARTERY USING [place];  Surgeon: Mal Misty, MD;  Location: [facility] OR;  Service: Vascular;  Laterality: Right;    TONSILLECTOMY       Home Medications:  Prior to Admission medications   Medication Sig Start Date End Date Taking? Authorizing Provider  amLODipine (NORVASC) 2.5 MG tablet Take 2.5 mg by mouth daily. 03/03/20  Yes [provider]  B Complex-C-Folic Acid (B COMPLEX-VITAMIN C-FOLIC ACID) 1 MG tablet Take 1 tablet by mouth daily with breakfast.   Yes [provider]  dicyclomine (BENTYL) 10 MG capsule Take 1 capsule (10 mg total) by mouth 4 (four) times daily -  before meals and at bedtime. Patient taking differently: Take 10 mg by mouth in the morning, at noon, and at bedtime. 04/04/21  Yes Annitta Needs, NP  Ergocalciferol (VITAMIN D2) 50  MCG (2000 UT) TABS Take 1 tablet by mouth daily.   Yes [provider]  FLUoxetine (PROZAC) 40 MG capsule Take 40 mg by mouth daily.   Yes [provider]  fluticasone furoate-vilanterol (BREO ELLIPTA) 100-25 MCG/INH AEPB Inhale 1 puff into the lungs daily. 11/11/19  Yes [provider]  gabapentin (NEURONTIN) 100 MG capsule Take 100 mg by mouth daily. 03/16/20  Yes [provider]  iron polysaccharides (NIFEREX) 150 MG capsule Take 1 capsule by mouth daily.   Yes [provider]  lidocaine (LIDODERM) 5 % Place 1 patch onto the skin.   Yes [provider]  Melatonin 3 MG TABS Take 6 mg by mouth at bedtime.    Yes [provider]  metoprolol tartrate (LOPRESSOR) 25 MG tablet Take 1 tablet (25 mg total) by mouth 2 (two) times daily. 07/19/18  Yes Cristal Deer, MD  montelukast (SINGULAIR) 10 MG tablet Take 10 mg by mouth daily.   Yes [provider]  oxyCODONE (OXY IR/ROXICODONE) 5 MG immediate release tablet Take 5 mg by mouth every 4 (four) hours as needed.   Yes [provider]  predniSONE (DELTASONE) 5 MG tablet Take 5 mg by mouth daily. 09/15/21  Yes [provider]  umeclidinium bromide (INCRUSE ELLIPTA) 62.5 MCG/ACT AEPB Inhale 1 puff into the lungs daily.    Yes [provider]  vitamin C (ASCORBIC ACID) 500 MG tablet Take 500 mg by mouth daily.   Yes [provider]  colestipol (COLESTID) 1 g tablet Take 2 tablets (2 g total) by mouth daily. Patient not taking: Reported on 03/23/2022 12/31/21   Annitta Needs, NP    Inpatient Medications: Scheduled Meds:  Chlorhexidine Gluconate Cloth  6 each Topical Daily   enoxaparin (LOVENOX) injection  40 mg Subcutaneous Q24H   FLUoxetine  40 mg Oral Daily   gabapentin  100 mg Oral Daily   melatonin  6 mg Oral QHS   metoprolol tartrate  25 mg Oral BID   montelukast  10 mg Oral Daily   sodium chloride flush  3 mL Intravenous Q12H   Continuous Infusions:  sodium chloride     cefTRIAXone (ROCEPHIN)  IV Stopped (03/23/22 1451)   diltiazem (CARDIZEM) infusion Stopped (03/24/22 0738)   PRN Meds: sodium chloride, acetaminophen **OR** acetaminophen, guaiFENesin, ondansetron **OR** ondansetron (ZOFRAN) IV, sodium chloride flush  Allergies:    Allergies  Allergen Reactions   Cardura [Doxazosin Mesylate] Hives   Cardura [Doxazosin]    Strawberry Extract     strawberries   Barbiturates Nausea And Vomiting and Rash   Levofloxacin Rash   Sulfa Antibiotics Nausea Only and Rash    Social History:   Social History   Socioeconomic History   Marital status: Divorced    Spouse name: Not on file   Number of children: Not on file   Years of education: Not on file   Highest education level: Not on file  Occupational History   Not on file  Tobacco Use   Smoking status: Never   Smokeless tobacco: Never  Vaping Use   Vaping Use: Never used  Substance and Sexual Activity   Alcohol use: Not Currently    Alcohol/week: 10.0 standard drinks of alcohol    Types: 10 Cans of beer per week    Comment: couple of beers daily   Drug use: No   Sexual activity: Never    Birth control/protection: Post-menopausal  Other Topics Concern   Not on file  Social  History Narrative   Not on file    Social Determinants of Health   Financial Resource Strain: Not on file  Food Insecurity: Not on file  Transportation Needs: Not on file  Physical Activity: Not on file  Stress: Not on file  Social Connections: Not on file  Intimate Partner Violence: Not on file    Family History:    Family History  Problem Relation Age of Onset   Heart disease Father    Colon cancer Neg Hx      ROS:  Please see the history of present illness.   All other ROS reviewed and negative.     Physical Exam/Data:   Vitals:   03/24/22 0700 03/24/22 0717 03/24/22 0800 03/24/22 0828  BP: 105/64  123/70   Pulse: 75  100 95  Resp: '17  19 17  '$ Temp:  97.6 F (36.4 C)    TempSrc:  Oral    SpO2: 100%  99% 99%  Weight:      Height:        Intake/Output Summary (Last 24 hours) at 03/24/2022 0839 Last data filed at 03/24/2022 0758 Gross per 24 hour  Intake 1273.34 ml  Output 1650 ml  Net -376.66 ml      03/23/2022    2:50 PM 03/23/2022    1:34 PM 12/31/2021    2:30 PM  Last 3 Weights  Weight (lbs) 93 lb 4.1 oz 100 lb 104 lb  Weight (kg) 42.3 kg 45.36 kg 47.174 kg     Body mass index is 16.01 kg/m.  General:  cachectic, frailt HEENT: normal Neck: no JVD Vascular: No carotid bruits; Distal pulses 2+ bilaterally Cardiac:  normal S1, S2; irregular rate and rhythm Lungs:  normal wob, expiratory wheezes  Abd: soft, nontender, no hepatomegaly  Ext: no LE edema Musculoskeletal:  No deformities, BUE and BLE strength normal and equal.  Skin: warm and dry  Neuro:  CNs 2-12 intact, no focal abnormalities noted Psych:  Normal affect   EKG:  The EKG was personally reviewed and demonstrates:  Afib, IVCD  Telemetry:  Telemetry was personally reviewed and demonstrates:  rate controlled afib with few PVCs  Relevant CV Studies: TTE 03/23/2022 Echo performed yesterday shows normal LV and RV function. Bi-atrial enlargement No WMA Mild TR Normal RVSP Mild posteriorly directed MR Normal  IVC  Laboratory Data:  High Sensitivity Troponin:   Recent Labs  Lab 03/23/22 1201 03/23/22 1340  TROPONINIHS 4 3     Chemistry Recent Labs  Lab 03/23/22 0938 03/24/22 0359  NA 137 140  K 3.9 3.6  CL 106 107  CO2 23 25  GLUCOSE 170* 94  BUN 16 17  CREATININE 1.26* 1.32*  CALCIUM 8.8* 8.7*  MG  --  1.9  GFRNONAA 44* 42*  ANIONGAP 8 8    Recent Labs  Lab 03/23/22 0938  PROT 6.5  ALBUMIN 3.1*  AST 24  ALT 20  ALKPHOS 92  BILITOT 0.7   Lipids No results for input(s): "CHOL", "TRIG", "HDL", "LABVLDL", "LDLCALC", "CHOLHDL" in the last 168 hours.  Hematology Recent Labs  Lab 03/23/22 0938 03/23/22 1040 03/24/22 0359  WBC 13.1* 12.5* 7.4  RBC 3.34* 3.30* 3.12*  HGB 9.9* 9.8* 9.3*  HCT 30.5* 30.8* 28.8*  MCV 91.3 93.3 92.3  MCH 29.6 29.7 29.8  MCHC 32.5 31.8 32.3  RDW 14.0 14.1 14.1  PLT 274 260 253   Thyroid  Recent Labs  Lab 03/23/22 1340  TSH 1.804  BNP Recent Labs  Lab 03/23/22 1006  BNP 580.0*    DDimer No results for input(s): "DDIMER" in the last 168 hours.   Radiology/Studies:  DG CHEST PORT 1 VIEW  Result Date: 03/24/2022 CLINICAL DATA:  Aspiration. EXAM: PORTABLE CHEST 1 VIEW COMPARISON:  03/23/2022 FINDINGS: 5:38 a.m. Similar enlargement of the heart silhouette but with interval near resolution of interstitial edema from the lower zones noted previously. There are trace pleural effusions. The lungs emphysematous with chronic right suprahilar scar-like opacities but no active pneumonia. The aorta is tortuous with atherosclerosis. There is osteopenia. Advanced asymmetric left shoulder DJD. IMPRESSION: Near-resolution of prior interstitial edema. Chronic change. Minimal pleural effusions. Electronically Signed   By: Telford Nab M.D.   On: 03/24/2022 07:48   CT Abdomen Pelvis W Contrast  Result Date: 03/23/2022 CLINICAL DATA:  76 year old female with acute abdominal and pelvic pain with nausea and vomiting. EXAM: CT ABDOMEN AND PELVIS  WITH CONTRAST TECHNIQUE: Multidetector CT imaging of the abdomen and pelvis was performed using the standard protocol following bolus administration of intravenous contrast. RADIATION DOSE REDUCTION: This exam was performed according to the departmental dose-optimization program which includes automated exposure control, adjustment of the mA and/or kV according to patient size and/or use of iterative reconstruction technique. CONTRAST:  66m OMNIPAQUE IOHEXOL 300 MG/ML  SOLN COMPARISON:  11/26/2021 and prior CTs FINDINGS: Lower chest: Trace bilateral pleural effusions are identified. Hepatobiliary: Liver is unchanged with no significant hepatic abnormalities noted. Patient is status post cholecystectomy. There is no evidence of intrahepatic or extrahepatic biliary dilatation. Pancreas: Unremarkable except for mild atrophy Spleen: Unremarkable Adrenals/Urinary Tract: A 5 mm calculus within the LEFT renal pelvis has migrated from the UPPER pole since the prior study. There is no evidence of hydronephrosis. 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 Bilateral renal atrophy and cortical thinning again noted. A punctate nonobstructing mid LEFT renal calculus is present. No obstructing urinary calculi are identified. Stomach/Bowel: Stomach is within normal limits. No evidence of bowel wall thickening, distention, or inflammatory changes. Vascular/Lymphatic: Aortic atherosclerosis. No enlarged abdominal or pelvic lymph nodes. Reproductive: No significant abnormality. Other: No ascites, focal collection or pneumoperitoneum. Musculoskeletal: A new 50% compression fracture of the L2 SUPERIOR endplate is noted since 11/26/2021, with 2 mm bony retropulsion. Compression fractures of T10, T12, L1 and L4 with vertebral augmentation changes in L4 again identified. Multilevel degenerative disc disease/spondylosis and facet arthropathy again noted. IMPRESSION: 1. New 50% compression fracture of  the L2 SUPERIOR endplate since 017/61/6073 with 2 mm bony retropulsion. 2. 5 mm LEFT renal calculus has migrated into the LEFT renal pelvis since the prior study. No evidence of hydronephrosis. 3. Trace bilateral pleural effusions. 4. Unchanged large RIGHT staghorn calculus and punctate nonobstructing LEFT renal calculus. 5. Aortic Atherosclerosis (ICD10-I70.0). Electronically Signed   By: JMargarette CanadaM.D.   On: 03/23/2022 11:35   DG Chest Port 1 View  Result Date: 03/23/2022 CLINICAL DATA:  Nausea, vomiting, and diarrhea. EXAM: PORTABLE CHEST 1 VIEW COMPARISON:  Acute abdominal series 07/04/2018 FINDINGS: Heart is upper limits of normal. Atherosclerotic changes are present at the aortic arch. Slight increase in the diffuse interstitial pattern is noted. No focal airspace consolidation is present. Advanced degenerative changes are noted in the shoulders. IMPRESSION: 1. Slight increase in diffuse interstitial pattern compatible with edema. 2. No focal airspace consolidation. Electronically Signed   By: CSan MorelleM.D.   On: 03/23/2022 10:44     Assessment and Plan:  Paroxysmal Atrial Fibrillation: new onset. Chads2vasc= 7 ( with hx of takotsubo). She is euvolemic. Echo from yesterday shows significant bi-atrial enlargement, normal LV/RV function, mild MR, normal RVSP. IVC is normal - stopped dilt gtt with rate control; transitioned to oral diltiazem 60 mg q8H; can consolidate if doing well with this dose (hold parameters SBP < 90 mmHg) - continue metop tartrate 25 mg BID - recommend initiation of eliquis 2.5 mg BID on discharge (has had crt > 1.5 in March, wt 42 kg) ( currently on weight based lovenox) - stop diuretics  2. O2 Demand -recommend nebulizers with expiratory wheezing  Risk Assessment/Risk Scores:          CHA2DS2-VASc Score = 7   This indicates a 11.2% annual risk of stroke. The patient's score is based upon: CHF History: 1 HTN History: 1 Diabetes History: 0 Stroke  History: 2 Vascular Disease History: 0 Age Score: 2 Gender Score: 1         For questions or updates, please contact [place] Please consult [email or web address] for contact info under    Signed, Janina Mayo, MD  03/24/2022 8:39 AM

## 2022-03-24 NOTE — Progress Notes (Signed)
Initial Nutrition Assessment  DOCUMENTATION CODES:   Non-severe (moderate) malnutrition in context of chronic illness  INTERVENTION:  Magic cup TID with meals   Assist with feeding   Multivitamin daily  Request for re-weight  NUTRITION DIAGNOSIS:   Moderate Malnutrition related to chronic illness as evidenced by mild fat depletion, moderate fat depletion, severe fat depletion, mild muscle depletion, moderate muscle depletion, severe muscle depletion, percent weight loss (noted below).   GOAL:  Patient will meet greater than or equal to 90% of their needs   MONITOR:  PO intake, Supplement acceptance, Labs, Weight trends  REASON FOR ASSESSMENT:   Malnutrition Screening Tool    ASSESSMENT: Patient is a 75 yo female with hx of COPD, IBS, CKD3b, chronic anemia, dysphagia who presents with complaint of abdominal pain, nausea and shortness of breath. New onset a-fib. Cardiology following.  Patient receiving purred foods with thin liquids. SLP evaluation pending. Dependent with feeding per patient. Lunch tray observed ~ 25% consumed. Patient doesn't drink Ensure but likes ice cream (chocolate or strawberry) - is willing to try Magic cups.  Will add to trays.   Medications reviewed. Weights- 12/31/21-47.2 kg currently 42.3 kg. Patient has significant loss of 10% in < 3 months. She is unable to elaborate. Denies change in eating pattern.       Latest Ref Rng & Units 03/24/2022    3:59 AM 03/23/2022    9:38 AM 11/26/2021    3:30 PM  BMP  Glucose 70 - 99 mg/dL 94  170    BUN 8 - 23 mg/dL 17  16    Creatinine 0.44 - 1.00 mg/dL 1.32  1.26  1.60   Sodium 135 - 145 mmol/L 140  137    Potassium 3.5 - 5.1 mmol/L 3.6  3.9    Chloride 98 - 111 mmol/L 107  106    CO2 22 - 32 mmol/L 25  23    Calcium 8.9 - 10.3 mg/dL 8.7  8.8       NUTRITION - FOCUSED PHYSICAL EXAM:  Flowsheet Row Most Recent Value  Orbital Region Moderate depletion  Upper Arm Region Severe depletion  Thoracic and  Lumbar Region Moderate depletion  Buccal Region Mild depletion  Temple Region Mild depletion  Clavicle Bone Region Moderate depletion  Clavicle and Acromion Bone Region Severe depletion  Dorsal Hand Mild depletion  Patellar Region Severe depletion  Anterior Thigh Region Severe depletion  Posterior Calf Region Severe depletion  Edema (RD Assessment) None  Hair Reviewed  Eyes Reviewed  Mouth Reviewed  Skin Reviewed  Nails Reviewed       Diet Order:   Diet Order             DIET - DYS 1 Room service appropriate? Yes; Fluid consistency: Thin  Diet effective now                   EDUCATION NEEDS:  Education needs have been addressed  Skin:  Skin Assessment: Reviewed RN Assessment  Last BM:  7/17 smear type 7  Height:   Ht Readings from Last 1 Encounters:  03/23/22 '5\' 4"'$  (1.626 m)    Weight:   Wt Readings from Last 1 Encounters:  03/23/22 42.3 kg    Ideal Body Weight:   55 kg  BMI:  Body mass index is 16.01 kg/m.  Estimated Nutritional Needs:   Kcal:  1300-1500  Protein:  60-65 gr  Fluid:  1300 ml  Colman Cater MS,RD,CSG,LDN Office: [phone] Pager: [phone]

## 2022-03-24 NOTE — Progress Notes (Signed)
Report given to Caryl Pina, RN on 300. SLP at bedside and will transport upon SLP eval completion. Bryson Corona [name]

## 2022-03-25 ENCOUNTER — Inpatient Hospital Stay (HOSPITAL_COMMUNITY): Payer: Medicare Other

## 2022-03-25 DIAGNOSIS — I639 Cerebral infarction, unspecified: Secondary | ICD-10-CM

## 2022-03-25 DIAGNOSIS — I4891 Unspecified atrial fibrillation: Secondary | ICD-10-CM | POA: Diagnosis not present

## 2022-03-25 DIAGNOSIS — E440 Moderate protein-calorie malnutrition: Secondary | ICD-10-CM | POA: Insufficient documentation

## 2022-03-25 LAB — URINE CULTURE: Culture: 30000 — AB

## 2022-03-25 LAB — CBC
HCT: 31.5 % — ABNORMAL LOW (ref 36.0–46.0)
Hemoglobin: 10 g/dL — ABNORMAL LOW (ref 12.0–15.0)
MCH: 29.6 pg (ref 26.0–34.0)
MCHC: 31.7 g/dL (ref 30.0–36.0)
MCV: 93.2 fL (ref 80.0–100.0)
Platelets: 227 10*3/uL (ref 150–400)
RBC: 3.38 MIL/uL — ABNORMAL LOW (ref 3.87–5.11)
RDW: 14.3 % (ref 11.5–15.5)
WBC: 8.6 10*3/uL (ref 4.0–10.5)
nRBC: 0 % (ref 0.0–0.2)

## 2022-03-25 LAB — BASIC METABOLIC PANEL
Anion gap: 10 (ref 5–15)
BUN: 20 mg/dL (ref 8–23)
CO2: 25 mmol/L (ref 22–32)
Calcium: 9 mg/dL (ref 8.9–10.3)
Chloride: 105 mmol/L (ref 98–111)
Creatinine, Ser: 1.36 mg/dL — ABNORMAL HIGH (ref 0.44–1.00)
GFR, Estimated: 40 mL/min — ABNORMAL LOW (ref >60)
Glucose, Bld: 86 mg/dL (ref 70–99)
Potassium: 3.2 mmol/L — ABNORMAL LOW (ref 3.5–5.1)
Sodium: 140 mmol/L (ref 135–145)

## 2022-03-25 LAB — MAGNESIUM: Magnesium: 1.9 mg/dL (ref 1.7–2.4)

## 2022-03-25 MED ORDER — ENOXAPARIN SODIUM 30 MG/0.3ML IJ SOSY
30.0000 mg | PREFILLED_SYRINGE | INTRAMUSCULAR | Status: DC
  Administered 2022-03-25 – 2022-03-26 (×2): 30 mg via SUBCUTANEOUS
  Filled 2022-03-25 (×2): qty 0.3

## 2022-03-25 MED ORDER — POTASSIUM CHLORIDE CRYS ER 20 MEQ PO TBCR
40.0000 meq | EXTENDED_RELEASE_TABLET | Freq: Once | ORAL | Status: AC
Start: 2022-03-25 — End: 2022-03-25
  Administered 2022-03-25: 40 meq via ORAL
  Filled 2022-03-25: qty 2

## 2022-03-25 MED ORDER — DILTIAZEM HCL ER COATED BEADS 180 MG PO CP24
180.0000 mg | ORAL_CAPSULE | Freq: Every day | ORAL | Status: DC
Start: 2022-03-26 — End: 2022-03-26
  Administered 2022-03-26: 180 mg via ORAL
  Filled 2022-03-25: qty 1

## 2022-03-25 MED ORDER — APIXABAN 2.5 MG PO TABS: 2.5000 mg | ORAL_TABLET | Freq: Two times a day (BID) | ORAL | Status: DC

## 2022-03-25 NOTE — Progress Notes (Signed)
Progress Note  Patient Name: Jordan [name] Date of Encounter: 03/25/2022  [facility] Cardiologist: New  Subjective   Patient off floor for CT scan  Inpatient Medications    Scheduled Meds:  Chlorhexidine Gluconate Cloth  6 each Topical Daily   diltiazem  60 mg Oral Q8H   enoxaparin (LOVENOX) injection  40 mg Subcutaneous Q24H   FLUoxetine  40 mg Oral Daily   gabapentin  100 mg Oral Daily   melatonin  6 mg Oral QHS   metoprolol tartrate  25 mg Oral BID   montelukast  10 mg Oral Daily   sodium chloride flush  3 mL Intravenous Q12H   Continuous Infusions:  sodium chloride     cefTRIAXone (ROCEPHIN)  IV Stopped (03/24/22 1401)   PRN Meds: sodium chloride, acetaminophen **OR** acetaminophen, guaiFENesin, levalbuterol, ondansetron **OR** ondansetron (ZOFRAN) IV, oxyCODONE, sodium chloride flush   Vital Signs    Vitals:   03/24/22 1631 03/25/22 0019 03/25/22 0417 03/25/22 0822  BP: 122/75 123/75 105/77 116/78  Pulse: 95 (!) 102 87   Resp: '18 17 16   '$ Temp: 97.8 F (36.6 C) 97.9 F (36.6 C) 97.6 F (36.4 C)   TempSrc: Oral Oral Oral   SpO2: 95% 96% 97%   Weight:      Height:        Intake/Output Summary (Last 24 hours) at 03/25/2022 0853 Last data filed at 03/24/2022 1600 Gross per 24 hour  Intake 100 ml  Output 750 ml  Net -650 ml      03/23/2022    2:50 PM 03/23/2022    1:34 PM 12/31/2021    2:30 PM  Last 3 Weights  Weight (lbs) 93 lb 4.1 oz 100 lb 104 lb  Weight (kg) 42.3 kg 45.36 kg 47.174 kg      Telemetry    Rate controlled afib - Personally Reviewed  ECG    N/a - Personally Reviewed  Physical Exam   Not performed, patient off floor  Labs    High Sensitivity Troponin:   Recent Labs  Lab 03/23/22 1201 03/23/22 1340  TROPONINIHS 4 3     Chemistry Recent Labs  Lab 03/23/22 0938 03/24/22 0359 03/25/22 0501  NA 137 140 140  K 3.9 3.6 3.2*  CL 106 107 105  CO2 '23 25 25  '$ GLUCOSE 170* 94 86  BUN '16 17 20  '$ CREATININE 1.26* 1.32*  1.36*  CALCIUM 8.8* 8.7* 9.0  MG  --  1.9 1.9  PROT 6.5  --   --   ALBUMIN 3.1*  --   --   AST 24  --   --   ALT 20  --   --   ALKPHOS 92  --   --   BILITOT 0.7  --   --   GFRNONAA 44* 42* 40*  ANIONGAP '8 8 10    '$ Lipids No results for input(s): "CHOL", "TRIG", "HDL", "LABVLDL", "LDLCALC", "CHOLHDL" in the last 168 hours.  Hematology Recent Labs  Lab 03/23/22 1040 03/24/22 0359 03/25/22 0501  WBC 12.5* 7.4 8.6  RBC 3.30* 3.12* 3.38*  HGB 9.8* 9.3* 10.0*  HCT 30.8* 28.8* 31.5*  MCV 93.3 92.3 93.2  MCH 29.7 29.8 29.6  MCHC 31.8 32.3 31.7  RDW 14.1 14.1 14.3  PLT 260 253 227   Thyroid  Recent Labs  Lab 03/23/22 1340  TSH 1.804    BNP Recent Labs  Lab 03/23/22 1006  BNP 580.0*    DDimer No results for  input(s): "DDIMER" in the last 168 hours.   Radiology    ECHOCARDIOGRAM COMPLETE  Result Date: 03/24/2022    ECHOCARDIOGRAM REPORT   Patient Name:   Jordan [name] Date of Exam: 03/23/2022 Medical Rec #:  [id number]     Height:       64.0 in Accession #:    [id number]    Weight:       93.3 lb Date of Birth:  [birth year]    BSA:          1.415 m Patient Age:    76 years      BP:           129/69 mmHg Patient Gender: F             HR:           87 bpm. Exam Location:  Inpatient Procedure: 2D Echo Indications:    atrial fibrillation  History:        Patient has prior history of Echocardiogram examinations, most                 recent 05/11/2009. History of Takotsubo, COPD and chronic kidney                 disease; Risk Factors:Hypertension.  Sonographer:    Johny Chess RDCS Referring Phys: [id number] [place] [facility]  Sonographer Comments: Technically difficult study due to poor echo windows. Image acquisition challenging due to respiratory motion. IMPRESSIONS  1. Cannot completely fully assess wall motion with technical challenges. Left ventricular ejection fraction, by estimation, is 60 to 65%. The left ventricle has normal function. The left ventricle has no regional wall motion  abnormalities. Left ventricular diastolic parameters are indeterminate.  2. Right ventricular systolic function is normal. The right ventricular size is normal. There is normal pulmonary artery systolic pressure.  3. Left atrial size was mildly dilated.  4. Right atrial size was mildly dilated.  5. Posteriorly directed jet. The mitral valve was not well visualized. Mild mitral valve regurgitation.  6. The aortic valve is normal in structure. There is mild calcification of the aortic valve. Aortic valve regurgitation is not visualized.  7. The inferior vena cava is normal in size with greater than 50% respiratory variability, suggesting right atrial pressure of 3 mmHg.  8. Cannot exclude a small PFO. FINDINGS  Left Ventricle: Cannot completely fully assess wall motion with technical challenges. Left ventricular ejection fraction, by estimation, is 60 to 65%. The left ventricle has normal function. The left ventricle has no regional wall motion abnormalities. The left ventricular internal cavity size was normal in size. There is no left ventricular hypertrophy. Left ventricular diastolic parameters are indeterminate. Right Ventricle: The right ventricular size is normal. No increase in right ventricular wall thickness. Right ventricular systolic function is normal. There is normal pulmonary artery systolic pressure. The tricuspid regurgitant velocity is 2.81 m/s, and  with an assumed right atrial pressure of 3 mmHg, the estimated right ventricular systolic pressure is 71.0 mmHg. Left Atrium: Left atrial size was mildly dilated. Right Atrium: Right atrial size was mildly dilated. Prominent Chiari network. Pericardium: There is no evidence of pericardial effusion. Mitral Valve: Posteriorly directed jet. The mitral valve was not well visualized. Mild mitral valve regurgitation. Tricuspid Valve: The tricuspid valve is normal in structure. Tricuspid valve regurgitation is mild. Aortic Valve: The aortic valve is normal in  structure. There is mild calcification of the aortic valve. Aortic valve regurgitation is not visualized. Pulmonic Valve:  The pulmonic valve was not well visualized. Pulmonic valve regurgitation is not visualized. Aorta: The aortic root and ascending aorta are structurally normal, with no evidence of dilitation. Venous: The inferior vena cava is normal in size with greater than 50% respiratory variability, suggesting right atrial pressure of 3 mmHg. IAS/Shunts: Cannot exclude a small PFO.  LEFT VENTRICLE PLAX 2D LVIDd:         3.60 cm LVIDs:         2.40 cm LV PW:         0.90 cm LV IVS:        0.90 cm LVOT diam:     1.60 cm LVOT Area:     2.01 cm  IVC IVC diam: 2.00 cm LEFT ATRIUM           Index        RIGHT ATRIUM           Index LA diam:      3.80 cm 2.69 cm/m   RA Area:     18.80 cm LA Vol (A4C): 37.7 ml 26.65 ml/m  RA Volume:   51.00 ml  36.05 ml/m   AORTA Ao Root diam: 3.10 cm Ao Asc diam:  3.40 cm TRICUSPID VALVE TR Peak grad:   31.6 mmHg TR Vmax:        281.00 cm/s  SHUNTS Systemic Diam: 1.60 cm Phineas Inches Electronically signed by Phineas Inches Signature Date/Time: 03/24/2022/10:52:25 AM    Final    DG CHEST PORT 1 VIEW  Result Date: 03/24/2022 CLINICAL DATA:  Aspiration. EXAM: PORTABLE CHEST 1 VIEW COMPARISON:  03/23/2022 FINDINGS: 5:38 a.m. Similar enlargement of the heart silhouette but with interval near resolution of interstitial edema from the lower zones noted previously. There are trace pleural effusions. The lungs emphysematous with chronic right suprahilar scar-like opacities but no active pneumonia. The aorta is tortuous with atherosclerosis. There is osteopenia. Advanced asymmetric left shoulder DJD. IMPRESSION: Near-resolution of prior interstitial edema. Chronic change. Minimal pleural effusions. Electronically Signed   By: Telford Nab M.D.   On: 03/24/2022 07:48   CT Abdomen Pelvis W Contrast  Result Date: 03/23/2022 CLINICAL DATA:  76 year old female with acute abdominal and  pelvic pain with nausea and vomiting. EXAM: CT ABDOMEN AND PELVIS WITH CONTRAST TECHNIQUE: Multidetector CT imaging of the abdomen and pelvis was performed using the standard protocol following bolus administration of intravenous contrast. RADIATION DOSE REDUCTION: This exam was performed according to the departmental dose-optimization program which includes automated exposure control, adjustment of the mA and/or kV according to patient size and/or use of iterative reconstruction technique. CONTRAST:  55m OMNIPAQUE IOHEXOL 300 MG/ML  SOLN COMPARISON:  11/26/2021 and prior CTs FINDINGS: Lower chest: Trace bilateral pleural effusions are identified. Hepatobiliary: Liver is unchanged with no significant hepatic abnormalities noted. Patient is status post cholecystectomy. There is no evidence of intrahepatic or extrahepatic biliary dilatation. Pancreas: Unremarkable except for mild atrophy Spleen: Unremarkable Adrenals/Urinary Tract: A 5 mm calculus within the LEFT renal pelvis has migrated from the UPPER pole since the prior study. There is no evidence of hydronephrosis. 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 Bilateral renal atrophy and cortical thinning again noted. A punctate nonobstructing mid LEFT renal calculus is present. No obstructing urinary calculi are identified. Stomach/Bowel: Stomach is within normal limits. No evidence of bowel wall thickening, distention, or inflammatory changes. Vascular/Lymphatic: Aortic atherosclerosis. No enlarged abdominal or pelvic lymph nodes. Reproductive: No significant abnormality. Other: No  ascites, focal collection or pneumoperitoneum. Musculoskeletal: A new 50% compression fracture of the L2 SUPERIOR endplate is noted since 11/26/2021, with 2 mm bony retropulsion. Compression fractures of T10, T12, L1 and L4 with vertebral augmentation changes in L4 again identified. Multilevel degenerative disc disease/spondylosis and facet  arthropathy again noted. IMPRESSION: 1. New 50% compression fracture of the L2 SUPERIOR endplate since 24/40/1027, with 2 mm bony retropulsion. 2. 5 mm LEFT renal calculus has migrated into the LEFT renal pelvis since the prior study. No evidence of hydronephrosis. 3. Trace bilateral pleural effusions. 4. Unchanged large RIGHT staghorn calculus and punctate nonobstructing LEFT renal calculus. 5. Aortic Atherosclerosis (ICD10-I70.0). Electronically Signed   By: Margarette Canada M.D.   On: 03/23/2022 11:35   DG Chest Port 1 View  Result Date: 03/23/2022 CLINICAL DATA:  Nausea, vomiting, and diarrhea. EXAM: PORTABLE CHEST 1 VIEW COMPARISON:  Acute abdominal series 07/04/2018 FINDINGS: Heart is upper limits of normal. Atherosclerotic changes are present at the aortic arch. Slight increase in the diffuse interstitial pattern is noted. No focal airspace consolidation is present. Advanced degenerative changes are noted in the shoulders. IMPRESSION: 1. Slight increase in diffuse interstitial pattern compatible with edema. 2. No focal airspace consolidation. Electronically Signed   By: San Morelle M.D.   On: 03/23/2022 10:44    Cardiac Studies   03/2022 echo 1. Cannot completely fully assess wall motion with technical challenges.  Left ventricular ejection fraction, by estimation, is 60 to 65%. The left  ventricle has normal function. The left ventricle has no regional wall  motion abnormalities. Left  ventricular diastolic parameters are indeterminate.   2. Right ventricular systolic function is normal. The right ventricular  size is normal. There is normal pulmonary artery systolic pressure.   3. Left atrial size was mildly dilated.   4. Right atrial size was mildly dilated.   5. Posteriorly directed jet. The mitral valve was not well visualized.  Mild mitral valve regurgitation.   6. The aortic valve is normal in structure. There is mild calcification  of the aortic valve. Aortic valve  regurgitation is not visualized.   7. The inferior vena cava is normal in size with greater than 50%  respiratory variability, suggesting right atrial pressure of 3 mmHg.   8. Cannot exclude a small PFO.   Patient Profile     Jordan [name] is a 76 y.o. female with a hx of  Jordan [name] is a 76 y.o. female with medical history significant for neuromuscular dysfunction of bladder and colon, CKD stage IIIb, hypertension, dyslipidemia, IBS, COPD, prior CVA, former smoker, depression, osteoporosis, lumbar compression fracture, and hemorrhoids who presented to the ED from her [facility]F on account of worsening nausea and shortness of breath as well as some abdominal pain. who is being seen 03/24/2022 for the evaluation of atrial fibrillation at the request of Dr. Manuella Ghazi.  Assessment & Plan    1.PAF - new diagnosis this admission - initially on dilt gtt, transitioned to oral dilt. Currnetly on dilt '60mg'$  tid and lopressor '25mg'$  bid. BP's tolerating. Consolidate dilt to '180mg'$  once daily starting tomrrow since already received short acting doses today - borderline dosing for eliquis 2.5 vs '5mg'$  given 42 kg, Cr varies 1.2-1.6. Can start 2.'5mg'$  bid, if Cr remains <1.5 over time increase dose. Make change when clear no invasive procedures indicated   Patient off floor this AM for CT scan, no billing placed for this encounter. We will f/u telemetry tomorrow AM, no additonal cardiology recs.  For questions or updates, please contact [place] Please consult [email or web address] for contact info under        Signed, Carlyle Dolly, MD  03/25/2022, 8:53 AM

## 2022-03-25 NOTE — Progress Notes (Signed)
PROGRESS NOTE    [name]  LOV:[id number] DOB: 1945-10-05 DOA: 03/23/2022 PCP: Caprice Renshaw, MD   Brief Narrative:    Jordan [name] is a 76 y.o. female with medical history significant for neuromuscular dysfunction of bladder and colon, CKD stage IIIb, hypertension, dyslipidemia, IBS, COPD, prior CVA, depression, osteoporosis, lumbar compression fracture, and hemorrhoids who presented to the ED from her SNF on account of worsening nausea and shortness of breath as well as some abdominal pain.  Patient was admitted with new onset paroxysmal atrial fibrillation with RVR with associated acute hypoxemic respiratory failure as well as UTI.  She was noted to have worsening arm weakness on 7/18 as well as dysphagia which prompted CT head that is now demonstrating subacute ischemic strokes.  MRI/MRA pending and neurology consulted.  Atrial fibrillation with RVR improved and medication dosing adjusted per cardiology.  Assessment & Plan:   Principal Problem:   New onset a-fib ([place])  Assessment and Plan:   New onset paroxysmal atrial fibrillation with RVR -TSH one-point eight 2D echocardiogram without any significant abnormalities LVEF 60-65% -IV diltiazem changed to oral diltiazem per cardiology, consolidated to 180 mg once daily starting 7/19 -CHA2DS2-VASc score of 4 or greater, will start on full dose Lovenox for anticoagulation -Continue home metoprolol -Okay for transfer to telemetry -Appreciate cardiology recommendations with plans to discharge on Eliquis   Acute hypoxemic respiratory failure secondary to pulmonary edema related to above -Hold further diuresis  -Wean oxygen as tolerated -Strict I's and O's  Subacute CVAs -Likely cardioembolic in the setting of new onset atrial fibrillation with RVR -MRI brain and MRA head and neck ordered and pending -Likely start Eliquis 2.5 mg twice daily, but await MRI results to determine timing of initiation   Proteus UTI -DC Rocephin after  today as 3 doses are completed   CKD stage IIIb -Currently stable and at baseline, monitor while on Lasix for diuresis   Essential hypertension -Currently with stable blood pressure readings -Continue metoprolol for now and hold amlodipine   COPD -No acute bronchospasms -DuoNebs as needed   New 50% compression fracture in the setting of prior compression fractures/osteoporosis -No complaints of new back pain noted -Continue to monitor   Chronic anemia -Currently stable -Continue to monitor CBC   Neuromuscular dysfunction of bladder and colon -Lives in SNF   Dysphagia -Dysphagia diet and SLP evaluation   DVT prophylaxis: Full dose Lovenox Code Status: Full Family Communication: None at bedside Disposition Plan:  Status is: Inpatient Remains inpatient appropriate because: Need for IV medications.   Consultants:  Cardiology Neurology   Procedures:  None   Antimicrobials:  Anti-infectives (From admission, onward)    Start     Dose/Rate Route Frequency Ordered Stop   03/23/22 1330  cefTRIAXone (ROCEPHIN) 1 g in sodium chloride 0.9 % 100 mL IVPB        1 g 200 mL/hr over 30 Minutes Intravenous Every 24 hours 03/23/22 1242         Subjective: Patient seen and evaluated today with worsening weakness to her bilateral upper extremities with difficulty feeding herself.  No acute overnight events noted.  Objective: Vitals:   03/25/22 0417 03/25/22 0822 03/25/22 1220 03/25/22 1520  BP: 105/77 116/78 (!) 114/57 111/69  Pulse: 87  67 88  Resp: 16  18   Temp: 97.6 F (36.4 C)  98.2 F (36.8 C)   TempSrc: Oral  Oral   SpO2: 97%  95%   Weight:  Height:        Intake/Output Summary (Last 24 hours) at 03/25/2022 1612 Last data filed at 03/25/2022 1300 Gross per 24 hour  Intake 480 ml  Output --  Net 480 ml   Filed Weights   03/23/22 1334 03/23/22 1450  Weight: 45.4 kg 42.3 kg    Examination:  General exam: Appears calm and comfortable  Respiratory  system: Clear to auscultation. Respiratory effort normal. Cardiovascular system: S1 & S2 heard, RRR.  Gastrointestinal system: Abdomen is soft Central nervous system: Alert and awake Extremities: No edema Skin: No significant lesions noted Psychiatry: Flat affect.    Data Reviewed: I have personally reviewed following labs and imaging studies  CBC: Recent Labs  Lab 03/23/22 0938 03/23/22 1040 03/24/22 0359 03/25/22 0501  WBC 13.1* 12.5* 7.4 8.6  NEUTROABS  --  8.7*  --   --   HGB 9.9* 9.8* 9.3* 10.0*  HCT 30.5* 30.8* 28.8* 31.5*  MCV 91.3 93.3 92.3 93.2  PLT 274 260 253 191   Basic Metabolic Panel: Recent Labs  Lab 03/23/22 0938 03/24/22 0359 03/25/22 0501  NA 137 140 140  K 3.9 3.6 3.2*  CL 106 107 105  CO2 '23 25 25  '$ GLUCOSE 170* 94 86  BUN '16 17 20  '$ CREATININE 1.26* 1.32* 1.36*  CALCIUM 8.8* 8.7* 9.0  MG  --  1.9 1.9   GFR: Estimated Creatinine Clearance: 23.5 mL/min (A) (by C-G formula based on SCr of 1.36 mg/dL (H)). Liver Function Tests: Recent Labs  Lab 03/23/22 0938  AST 24  ALT 20  ALKPHOS 92  BILITOT 0.7  PROT 6.5  ALBUMIN 3.1*   Recent Labs  Lab 03/23/22 0938  LIPASE 22   No results for input(s): "AMMONIA" in the last 168 hours. Coagulation Profile: No results for input(s): "INR", "PROTIME" in the last 168 hours. Cardiac Enzymes: No results for input(s): "CKTOTAL", "CKMB", "CKMBINDEX", "TROPONINI" in the last 168 hours. BNP (last 3 results) No results for input(s): "PROBNP" in the last 8760 hours. HbA1C: No results for input(s): "HGBA1C" in the last 72 hours. CBG: No results for input(s): "GLUCAP" in the last 168 hours. Lipid Profile: No results for input(s): "CHOL", "HDL", "LDLCALC", "TRIG", "CHOLHDL", "LDLDIRECT" in the last 72 hours. Thyroid Function Tests: Recent Labs    03/23/22 1340  TSH 1.804   Anemia Panel: No results for input(s): "VITAMINB12", "FOLATE", "FERRITIN", "TIBC", "IRON", "RETICCTPCT" in the last 72  hours. Sepsis Labs: No results for input(s): "PROCALCITON", "LATICACIDVEN" in the last 168 hours.  Recent Results (from the past 240 hour(s))  Urine Culture     Status: Abnormal   Collection Time: 03/23/22 12:00 PM   Specimen: Urine, Clean Catch  Result Value Ref Range Status   Specimen Description   Final    URINE, CLEAN CATCH Performed at [facility], [address], [place], [place] [address]    Special Requests   Final    NONE Performed at [facility], [address], [place], [place] [address]    Culture 30,000 COLONIES/mL PROTEUS MIRABILIS (A)  Final   Report Status 03/25/2022 FINAL  Final   Organism ID, Bacteria PROTEUS MIRABILIS (A)  Final      Susceptibility   Proteus mirabilis - MIC*    AMPICILLIN <=2 SENSITIVE Sensitive     CEFAZOLIN <=4 SENSITIVE Sensitive     CEFEPIME <=0.12 SENSITIVE Sensitive     CEFTRIAXONE <=0.25 SENSITIVE Sensitive     CIPROFLOXACIN <=0.25 SENSITIVE Sensitive     GENTAMICIN <=  1 SENSITIVE Sensitive     IMIPENEM 1 SENSITIVE Sensitive     NITROFURANTOIN 128 RESISTANT Resistant     TRIMETH/SULFA <=20 SENSITIVE Sensitive     AMPICILLIN/SULBACTAM <=2 SENSITIVE Sensitive     PIP/TAZO <=4 SENSITIVE Sensitive     * 30,000 COLONIES/mL PROTEUS MIRABILIS  Culture, blood (Routine X 2) w Reflex to ID Panel     Status: None (Preliminary result)   Collection Time: 03/23/22  1:30 PM   Specimen: BLOOD RIGHT WRIST  Result Value Ref Range Status   Specimen Description   Final    BLOOD RIGHT WRIST BOTTLES DRAWN AEROBIC AND ANAEROBIC   Special Requests Blood Culture adequate volume  Final   Culture   Final    NO GROWTH 2 DAYS Performed at [facility], [address], [place], [place] [address]    Report Status PENDING  Incomplete  Culture, blood (Routine X 2) w Reflex to ID Panel     Status: None (Preliminary result)   Collection Time: 03/23/22  1:40 PM   Specimen: Right Antecubital; Blood  Result Value Ref Range Status   Specimen Description   Final     RIGHT ANTECUBITAL BOTTLES DRAWN AEROBIC AND ANAEROBIC   Special Requests   Final    Blood Culture results may not be optimal due to an excessive volume of blood received in culture bottles   Culture   Final    NO GROWTH 2 DAYS Performed at [facility], [address], [place], [place] [address]    Report Status PENDING  Incomplete  MRSA Next Gen by PCR, Nasal     Status: None   Collection Time: 03/23/22  2:41 PM   Specimen: Nasal Mucosa; Nasal Swab  Result Value Ref Range Status   MRSA by PCR Next Gen NOT DETECTED NOT DETECTED Final    Comment: (NOTE) The GeneXpert MRSA Assay (FDA approved for NASAL specimens only), is one component of a comprehensive MRSA colonization surveillance program. It is not intended to diagnose MRSA infection nor to guide or monitor treatment for MRSA infections. Test performance is not FDA approved in patients less than 57 years old. Performed at [facility], [address], [place],  [address]          Radiology Studies: CT HEAD WO CONTRAST (5MM)  Result Date: 03/25/2022 CLINICAL DATA:  Provided history: Neuro deficit, acute, stroke suspected. Additional history provided: UTI. EXAM: CT HEAD WITHOUT CONTRAST TECHNIQUE: Contiguous axial images were obtained from the base of the skull through the vertex without intravenous contrast. RADIATION DOSE REDUCTION: This exam was performed according to the departmental dose-optimization program which includes automated exposure control, adjustment of the mA and/or kV according to patient size and/or use of iterative reconstruction technique. COMPARISON:  Brain MRI 05/05/2017. FINDINGS: Brain: Moderate to advanced generalized cerebral atrophy. Comparatively mild cerebellar atrophy. Advanced patchy and ill-defined hypoattenuation within the cerebral white matter, nonspecific but compatible with chronic small vessel ischemic disease. Multiple infarcts within bilateral cerebellar hemispheres, new from the prior  brain MRI of 05/05/2017. An infarct within the superior right cerebellar hemisphere appears subacute to chronic. The remaining infarcts appear acute/subacute. The largest infarct is located within the superior left cerebellar hemisphere, measuring 3 cm. There is no acute intracranial hemorrhage. No demarcated cortical infarct. No extra-axial fluid collection. No evidence of an intracranial mass. No midline shift. Vascular: No hyperdense vessel.  Atherosclerotic calcifications. Skull: No fracture or aggressive osseous lesion. Sinuses/Orbits: No mass or acute finding within the imaged orbits.  Essentially complete opacification of the bilateral maxillary and sphenoid sinuses with associated chronic reactive osteitis. Extensive partial opacification of the bilateral ethmoid air cells (with associated chronic reactive osteitis). Mild mucosal thickening within the right frontal sinus. Other: Partially imaged cervical posterior spinal fusion construct. Partially imaged lucency surrounding the bilateral C1 lateral mass screws, suggesting hardware loosening (series 3, image 1). Impression #1 will be called to the ordering clinician or representative by the Radiologist Assistant, and communication documented in the PACS or [employer]. IMPRESSION: 1. 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 The majority of these infarcts appear acute/subacute. Consider a brain MRI for further evaluation. 2. Advanced chronic small vessel given changes within the cerebral white matter. 3. Moderate to advanced generalized cerebral atrophy. 4. Comparatively mild cerebellar atrophy. 5. Severe paranasal sinus disease, as described. 6. Partially imaged cervical posterior spinal fusion construct. Lucency surrounding the bilateral C1 lateral mass screws, suggesting hardware loosening. Electronically Signed   By: Kellie Simmering D.O.   On: 03/25/2022 10:13   ECHOCARDIOGRAM  COMPLETE  Result Date: 03/24/2022    ECHOCARDIOGRAM REPORT   Patient Name:   NAOMY ESHAM Date of Exam: 03/23/2022 Medical Rec #:  [id number]     Height:       64.0 in Accession #:    [id number]    Weight:       93.3 lb Date of Birth:  [birth year]    BSA:          1.415 m Patient Age:    1 years      BP:           129/69 mmHg Patient Gender: F             HR:           87 bpm. Exam Location:  Inpatient Procedure: 2D Echo Indications:    atrial fibrillation  History:        Patient has prior history of Echocardiogram examinations, most                 recent 05/11/2009. History of Takotsubo, COPD and chronic kidney                 disease; Risk Factors:Hypertension.  Sonographer:    Johny Chess RDCS Referring Phys: [id number] [place] [facility]  Sonographer Comments: Technically difficult study due to poor echo windows. Image acquisition challenging due to respiratory motion. IMPRESSIONS  1. Cannot completely fully assess wall motion with technical challenges. Left ventricular ejection fraction, by estimation, is 60 to 65%. The left ventricle has normal function. The left ventricle has no regional wall motion abnormalities. Left ventricular diastolic parameters are indeterminate.  2. Right ventricular systolic function is normal. The right ventricular size is normal. There is normal pulmonary artery systolic pressure.  3. Left atrial size was mildly dilated.  4. Right atrial size was mildly dilated.  5. Posteriorly directed jet. The mitral valve was not well visualized. Mild mitral valve regurgitation.  6. The aortic valve is normal in structure. There is mild calcification of the aortic valve. Aortic valve regurgitation is not visualized.  7. The inferior vena cava is normal in size with greater than 50% respiratory variability, suggesting right atrial pressure of 3 mmHg.  8. Cannot exclude a small PFO. FINDINGS  Left Ventricle: Cannot completely fully assess wall motion with technical challenges. Left ventricular  ejection fraction, by estimation, is 60 to 65%. The left ventricle has  normal function. The left ventricle has no regional wall motion abnormalities. The left ventricular internal cavity size was normal in size. There is no left ventricular hypertrophy. Left ventricular diastolic parameters are indeterminate. Right Ventricle: The right ventricular size is normal. No increase in right ventricular wall thickness. Right ventricular systolic function is normal. There is normal pulmonary artery systolic pressure. The tricuspid regurgitant velocity is 2.81 m/s, and  with an assumed right atrial pressure of 3 mmHg, the estimated right ventricular systolic pressure is 44.0 mmHg. Left Atrium: Left atrial size was mildly dilated. Right Atrium: Right atrial size was mildly dilated. Prominent Chiari network. Pericardium: There is no evidence of pericardial effusion. Mitral Valve: Posteriorly directed jet. The mitral valve was not well visualized. Mild mitral valve regurgitation. Tricuspid Valve: The tricuspid valve is normal in structure. Tricuspid valve regurgitation is mild. Aortic Valve: The aortic valve is normal in structure. There is mild calcification of the aortic valve. Aortic valve regurgitation is not visualized. Pulmonic Valve: The pulmonic valve was not well visualized. Pulmonic valve regurgitation is not visualized. Aorta: The aortic root and ascending aorta are structurally normal, with no evidence of dilitation. Venous: The inferior vena cava is normal in size with greater than 50% respiratory variability, suggesting right atrial pressure of 3 mmHg. IAS/Shunts: Cannot exclude a small PFO.  LEFT VENTRICLE PLAX 2D LVIDd:         3.60 cm LVIDs:         2.40 cm LV PW:         0.90 cm LV IVS:        0.90 cm LVOT diam:     1.60 cm LVOT Area:     2.01 cm  IVC IVC diam: 2.00 cm LEFT ATRIUM           Index        RIGHT ATRIUM           Index LA diam:      3.80 cm 2.69 cm/m   RA Area:     18.80 cm LA Vol (A4C): 37.7 ml  26.65 ml/m  RA Volume:   51.00 ml  36.05 ml/m   AORTA Ao Root diam: 3.10 cm Ao Asc diam:  3.40 cm TRICUSPID VALVE TR Peak grad:   31.6 mmHg TR Vmax:        281.00 cm/s  SHUNTS Systemic Diam: 1.60 cm Phineas Inches Electronically signed by Phineas Inches Signature Date/Time: 03/24/2022/10:52:25 AM    Final    DG CHEST PORT 1 VIEW  Result Date: 03/24/2022 CLINICAL DATA:  Aspiration. EXAM: PORTABLE CHEST 1 VIEW COMPARISON:  03/23/2022 FINDINGS: 5:38 a.m. Similar enlargement of the heart silhouette but with interval near resolution of interstitial edema from the lower zones noted previously. There are trace pleural effusions. The lungs emphysematous with chronic right suprahilar scar-like opacities but no active pneumonia. The aorta is tortuous with atherosclerosis. There is osteopenia. Advanced asymmetric left shoulder DJD. IMPRESSION: Near-resolution of prior interstitial edema. Chronic change. Minimal pleural effusions. Electronically Signed   By: Telford Nab M.D.   On: 03/24/2022 07:48        Scheduled Meds:  Chlorhexidine Gluconate Cloth  6 each Topical Daily   [START ON 03/26/2022] diltiazem  180 mg Oral Daily   diltiazem  60 mg Oral Q8H   enoxaparin (LOVENOX) injection  30 mg Subcutaneous Q24H   FLUoxetine  40 mg Oral Daily   gabapentin  100 mg Oral Daily   melatonin  6 mg Oral QHS  metoprolol tartrate  25 mg Oral BID   montelukast  10 mg Oral Daily   sodium chloride flush  3 mL Intravenous Q12H   Continuous Infusions:  sodium chloride     cefTRIAXone (ROCEPHIN)  IV 1 g (03/25/22 1249)     LOS: 2 days    Time spent: 35 minutes    Larrisha Babineau Darleen Crocker, DO [facility]  If 7PM-7AM, please contact night-coverage [email or web address] 03/25/2022, 4:12 PM

## 2022-03-25 NOTE — Progress Notes (Signed)
Modified Barium Swallow Progress Note  Patient Details  Name: Jordan [name] MRN: [id number] Date of Birth: 1946-06-04  Today's Date: 03/25/2022  Modified Barium Swallow completed.  Full report located under Chart Review in the Imaging Section.  Brief recommendations include the following:  Clinical Impression  Pt presents with moderate/severe oropharyngeal dysphagia characterized by impaired lingual movement and impaired labial seal resulting in left labial spillage with liquids, premature spillage to the pyriforms with liquids; Pt with reduced tongue base retraction, epiglottic deflection, and laryngeal closure resulting in penetration/aspiration before and during the swallow with cup sips of thin and NTL which was sensed with a weak cough and not removed, and signifcant pooling after the swallow from lingual residuals spilling into the valleculae, lateral channels, and pyriforms. Pt also noted to have a prominent CP bar and also has cervical hardware from previous surgery. Pt's primary deficit appears related to gross weakness and fatigue at this time. MRI is pending. Recommend short term alternative means of nutrition vs allowing D1/puree and honey thick liquids. Pt has great difficulty initiating a second swallow (timewise). Once she does initiate it, is effective in reducing pharyngeal residue. Pt will need ongoing dysphagia intervention. Will downgrade diet to D1/puree and HTL via cup sips with feeder assist and po medications whole in puree, cue Pt to swallow 2-3x per bite/sip, d/c PO if too fatigued. SLP will f/u tomorrow.   Swallow Evaluation Recommendations       SLP Diet Recommendations: Dysphagia 1 (Puree) solids;Honey thick liquids;Alternative means - temporary;Ice chips PRN after oral care   Liquid Administration via: Cup;No straw   Medication Administration: Whole meds with puree   Supervision: Full assist for feeding;Staff to assist with self feeding;Full supervision/cueing  for compensatory strategies   Compensations: Slow rate;Small sips/bites;Multiple dry swallows after each bite/sip;Clear throat intermittently;Effortful swallow   Postural Changes: Remain semi-upright after after feeds/meals (Comment);Seated upright at 90 degrees   Oral Care Recommendations: Oral care BID;Staff/trained caregiver to provide oral care   Other Recommendations: Order thickener from pharmacy;Prohibited food (jello, ice cream, thin soups);Clarify dietary restrictions   Thank you,  Genene Churn, [place]  [place] 03/25/2022,4:48 PM

## 2022-03-25 NOTE — NC FL2 (Signed)
[place] LEVEL OF CARE SCREENING TOOL     IDENTIFICATION  Patient Name: Jordan [name] Birthdate: 25-Mar-1946 Sex: female Admission Date (Current Location): 03/23/2022  [place] and [place] Number:  [facility] [id number] [place] and Address:  [facility] [address] [place]      Provider Number: [id number]  Attending Physician Name and Address:  Rodena Goldmann, DO  Relative Name and Phone Number:  Phill Myron Denman George)   [phone]    Current Level of Care: Hospital Recommended Level of Care: [place] Prior Approval Number:    Date Approved/Denied:   PASRR Number:    Discharge Plan: SNF    Current Diagnoses: Patient Active Problem List   Diagnosis Date Noted   New onset a-fib ([place]) 03/23/2022   Prolapsed internal hemorrhoids, grade 3 04/04/2021   PAT (paroxysmal atrial tachycardia) ([place]) 02/23/2020   Nephrolithiasis 02/23/2020   Sinusitis 02/23/2020   Aftercare following right knee joint replacement surgery 12/01/2018   GERD (gastroesophageal reflux disease) 11/18/2018   CKD (chronic kidney disease) stage 3, GFR 30-59 ml/min ([place]) 11/17/2018   IBS (irritable bowel syndrome) 11/17/2018   Ileus ([place])    Clostridium difficile colitis    Paralytic ileus of large intestine ([place])    Pressure injury of skin 07/09/2018   Protein-calorie malnutrition, severe 07/09/2018   Pseudomembranous colitis    Megacolon due to infectious colitis 07/04/2018   AKI (acute kidney injury) ([place]) 07/04/2018   Hypokalemia 07/04/2018   Lumbar compression fracture ([place]) 02/23/2018   S/P cervical spinal fusion 08/27/2017   Depression 06/02/2017   Dysphagia 06/02/2017   Impaired mobility and ADLs 06/02/2017   Neurocognitive deficits 06/02/2017   Neurogenic bladder 06/02/2017   Neurogenic bowel 06/02/2017   Displaced fracture of first cervical vertebra ([place]) 05/05/2017   Fall 05/05/2017   Anemia 02/20/2017   History of colon  polyps 02/20/2017   Chronic diarrhea 02/20/2017   Abnormal weight loss 02/20/2017   Carotid stenosis, symptomatic w/o infarct 01/01/2016   Carotid stenosis 11/27/2015   Ganglion cyst of wrist    Hematoma 01/23/2014   Lumbar foraminal stenosis 10/17/2013   Olecranon bursitis of left elbow 09/14/2013   Left elbow pain 09/14/2013   C. difficile diarrhea 06/20/2013   Loose stools 03/23/2013   TAKOTSUBO SYNDROME 05/04/2009   PALPITATIONS 05/04/2009   Right-sided thoracic back pain 05/04/2009   Essential hypertension 04/24/2009   ASTHMA 04/24/2009   COPD (chronic obstructive pulmonary disease) ([place]) 04/24/2009   Steroid-dependent asthma 04/24/2009   MRSA (methicillin resistant staphylococcus aureus) pneumonia ([place]) 09/05/2005    Orientation RESPIRATION BLADDER Height & Weight     Self, Time, Situation, Place  Normal Incontinent Weight: 93 lb 4.1 oz (42.3 kg) Height:  '5\' 4"'$  (162.6 cm)  BEHAVIORAL SYMPTOMS/MOOD NEUROLOGICAL BOWEL NUTRITION STATUS      Incontinent Diet (Diet DYS 2)  AMBULATORY STATUS COMMUNICATION OF NEEDS Skin   Extensive Assist Verbally Normal                       Personal Care Assistance Level of Assistance  Bathing, Feeding, Dressing Bathing Assistance: Maximum assistance Feeding assistance: Limited assistance Dressing Assistance: Maximum assistance     Functional Limitations Info  Sight, Hearing, Speech Sight Info: Adequate Hearing Info: Adequate Speech Info: Adequate    SPECIAL CARE FACTORS FREQUENCY                       Contractures Contractures Info: Not  present    Additional Factors Info  Code Status, Allergies Code Status Info: FULL Allergies Info: Cardura (Doxazosin Mesylate), Cardura (Doxazosin), Strawberry Extract, Barbiturates, Levofloxacin, Sulfa Antibiotics           Current Medications (03/25/2022):  This is the current hospital active medication list Current Facility-Administered Medications  Medication Dose Route  Frequency Provider Last Rate Last Admin   0.9 %  sodium chloride infusion  250 mL Intravenous PRN Manuella Ghazi, Pratik D, DO       acetaminophen (TYLENOL) tablet 650 mg  650 mg Oral Q6H PRN Heath Lark D, DO   650 mg at 03/23/22 2308   Or   acetaminophen (TYLENOL) suppository 650 mg  650 mg Rectal Q6H PRN Manuella Ghazi, Pratik D, DO       cefTRIAXone (ROCEPHIN) 1 g in sodium chloride 0.9 % 100 mL IVPB  1 g Intravenous Q24H Manuella Ghazi, Pratik D, DO 200 mL/hr at 03/25/22 1249 1 g at 03/25/22 1249   Chlorhexidine Gluconate Cloth 2 % PADS 6 each  6 each Topical Daily Adefeso, [place], DO   6 each at 03/25/22 0957   [START ON 03/26/2022] diltiazem (CARDIZEM CD) 24 hr capsule 180 mg  180 mg Oral Daily Branch, Alphonse Guild, MD       diltiazem (CARDIZEM) tablet 60 mg  60 mg Oral Q8H Arnoldo Lenis, MD   60 mg at 03/25/22 0559   enoxaparin (LOVENOX) injection 30 mg  30 mg Subcutaneous Q24H Arnoldo Lenis, MD   30 mg at 03/25/22 0957   FLUoxetine (PROZAC) capsule 40 mg  40 mg Oral Daily Manuella Ghazi, Pratik D, DO   40 mg at 03/25/22 4193   gabapentin (NEURONTIN) capsule 100 mg  100 mg Oral Daily Manuella Ghazi, Pratik D, DO   100 mg at 03/25/22 0815   guaiFENesin (ROBITUSSIN) 100 MG/5ML liquid 10 mL  10 mL Oral Q6H PRN Manuella Ghazi, Pratik D, DO       levalbuterol (XOPENEX) nebulizer solution 0.63 mg  0.63 mg Nebulization Q6H PRN Manuella Ghazi, Pratik D, DO       melatonin tablet 6 mg  6 mg Oral QHS Shah, Pratik D, DO   6 mg at 03/24/22 2134   metoprolol tartrate (LOPRESSOR) tablet 25 mg  25 mg Oral BID Manuella Ghazi, Pratik D, DO   25 mg at 03/25/22 0816   montelukast (SINGULAIR) tablet 10 mg  10 mg Oral Daily Manuella Ghazi, Pratik D, DO   10 mg at 03/25/22 0815   ondansetron (ZOFRAN) tablet 4 mg  4 mg Oral Q6H PRN Manuella Ghazi, Pratik D, DO       Or   ondansetron [facility]) injection 4 mg  4 mg Intravenous Q6H PRN Manuella Ghazi, Pratik D, DO   4 mg at 03/24/22 2106   oxyCODONE (Oxy IR/ROXICODONE) immediate release tablet 5 mg  5 mg Oral Q6H PRN Manuella Ghazi, Pratik D, DO   5 mg at 03/25/22 1043    sodium chloride flush (NS) 0.9 % injection 3 mL  3 mL Intravenous Q12H Shah, Pratik D, DO   3 mL at 03/25/22 0957   sodium chloride flush (NS) 0.9 % injection 3 mL  3 mL Intravenous PRN Heath Lark D, DO         Discharge Medications: Please see discharge summary for a list of discharge medications.  Relevant Imaging Results:  Relevant Lab Results:   Additional Information SSN: [id number]  Jahad Old, Clydene Pugh, LCSW

## 2022-03-25 NOTE — Consult Note (Addendum)
I connected with  [name] on 03/25/22 by a video enabled telemedicine application and verified that I am speaking with the correct person using two identifiers.   I discussed the limitations of evaluation and management by telemedicine. The patient expressed understanding and agreed to proceed.  Location of patient: [facility] Location of physician: [facility]  Neurology Consultation Reason for Consult: Stroke Referring Physician: Dr. Heath Lark  CC: Trouble breathing, abdominal discomfort  History is obtained from: Patient, patient's friend at bedside, chart review  HPI: Jordan [name] is a 76 y.o. female with past medical history of CKD stage IIIb, hypertension, hyperlipidemia, prior stroke, COPD, depression, osteoporosis, hemorrhoids who presented to the emergency room on 03/23/2022  from SNF with complaints of nausea, trouble breathing as well as abdominal pain.  She was diagnosed with atrial fibrillation and started on diltiazem for rate control.  While in the hospital yesterday, patient was noted to have trouble swallowing.  Today patient was noted to have bilateral upper extremity weakness and therefore CT head was ordered which showed multiple infarcts within the bilateral cerebellar hemispheres.  Therefore neurology was consulted.  Currently patient is reporting some nausea and feeling dizzy when she gets up to walk.  Patient denies any focal weakness, vision changes, trouble swallowing, speech disturbance.  Last known normal: Unclear, suspected 03/23/2022 Event happened at SNF No acute events admitted No thrombectomy as unlikely large vessel occlusion, MRA pending mRS 2  ROS: All other systems reviewed and negative except as noted in the HPI.   Past Medical History:  Diagnosis Date   Allergy    seasonal   Anemia    Arthritis    Asthma    uses Advair and Spiriva daily,Albuterol prn.Takes Singulair at bedtime and Prednisone daily   Back spasm    takes  Zanaflex daily as needed   Blood transfusion 2007   no abnormal reation noted    Bruises easily    d/t being on Prednisone   Bursitis    left elbow   C. difficile colitis    2007: treated with Flagyl and Vanc, 2014: treated with vanc, Aug 2014: Vanc taper   Chronic back pain    Chronic kidney disease    "creatine" creeping up - followed at this time by PCP   Complication of anesthesia 2000   woke up during one surgery- ovary surgery   COPD (chronic obstructive pulmonary disease) ([place])    Depression    takes Prozac daily   Dysphasia    Falls    H/O hiatal hernia    Hemorrhoids    History of bronchitis    last time a yr ago   History of colon polyps    History of kidney stones    History of kidney stones    Hyperlipidemia    hx of-was on meds but has been off x 1 1/2 yrs   Hypertension    takes Verapamil nightly   IBS (irritable bowel syndrome)    Insomnia    takes Benadryl as needed   Myocardial infarction [facility]) 2007   Takotsubo cardiomyopathy   Neurogenic bowel    Neuromuscular dysfunction of bladder, unspecified    Nocturia    Osteoporosis    takes Fosamax weekly   Pneumonia    MRSA pneumonia in 2007   Shortness of breath    with exertion daily   Stroke [facility])    "they say I has a small stroke".No deficits   Tingling  and pain in left leg   Urinary frequency     Family History  Problem Relation Age of Onset   Heart disease Father    Colon cancer Neg Hx     Social History:  reports that she has never smoked. She has never used smokeless tobacco. She reports that she does not currently use alcohol after a past usage of about 10.0 standard drinks of alcohol per week. She reports that she does not use drugs.   Medications Prior to Admission  Medication Sig Dispense Refill Last Dose   amLODipine (NORVASC) 2.5 MG tablet Take 2.5 mg by mouth daily.   03/23/2022   B Complex-C-Folic Acid (B COMPLEX-VITAMIN C-FOLIC ACID) 1 MG tablet Take 1 tablet by mouth daily  with breakfast.   03/23/2022   dicyclomine (BENTYL) 10 MG capsule Take 1 capsule (10 mg total) by mouth 4 (four) times daily -  before meals and at bedtime. (Patient taking differently: Take 10 mg by mouth in the morning, at noon, and at bedtime.) 120 capsule 3 03/23/2022   Ergocalciferol (VITAMIN D2) 50 MCG (2000 UT) TABS Take 1 tablet by mouth daily.   03/23/2022   FLUoxetine (PROZAC) 40 MG capsule Take 40 mg by mouth daily.   03/23/2022   fluticasone furoate-vilanterol (BREO ELLIPTA) 100-25 MCG/INH AEPB Inhale 1 puff into the lungs daily.   03/23/2022   gabapentin (NEURONTIN) 100 MG capsule Take 100 mg by mouth daily.   03/23/2022   iron polysaccharides (NIFEREX) 150 MG capsule Take 1 capsule by mouth daily.   03/23/2022   lidocaine (LIDODERM) 5 % Place 1 patch onto the skin.   03/23/2022   Melatonin 3 MG TABS Take 6 mg by mouth at bedtime.    03/22/2022   metoprolol tartrate (LOPRESSOR) 25 MG tablet Take 1 tablet (25 mg total) by mouth 2 (two) times daily. 60 tablet 0 03/23/2022 at 0900   montelukast (SINGULAIR) 10 MG tablet Take 10 mg by mouth daily.   03/23/2022   oxyCODONE (OXY IR/ROXICODONE) 5 MG immediate release tablet Take 5 mg by mouth every 4 (four) hours as needed.   03/23/2022   predniSONE (DELTASONE) 5 MG tablet Take 5 mg by mouth daily.   03/23/2022   umeclidinium bromide (INCRUSE ELLIPTA) 62.5 MCG/ACT AEPB Inhale 1 puff into the lungs daily.   03/23/2022   vitamin C (ASCORBIC ACID) 500 MG tablet Take 500 mg by mouth daily.   03/23/2022   colestipol (COLESTID) 1 g tablet Take 2 tablets (2 g total) by mouth daily. (Patient not taking: Reported on 03/23/2022) 60 tablet 3 Not Taking      Exam: Current vital signs: BP (!) 114/57 (BP Location: Right Arm)   Pulse 67   Temp 98.2 F (36.8 C) (Oral)   Resp 18   Ht '5\' 4"'$  (1.626 m)   Wt 42.3 kg   SpO2 95%   BMI 16.01 kg/m  Vital signs in last 24 hours: Temp:  [97.6 F (36.4 C)-98.2 F (36.8 C)] 98.2 F (36.8 C) (07/18 1220) Pulse Rate:   [67-102] 67 (07/18 1220) Resp:  [16-23] 18 (07/18 1220) BP: (102-128)/(44-81) 114/57 (07/18 1220) SpO2:  [92 %-97 %] 95 % (07/18 1220)  Physical Exam  Constitutional: Appears well-developed and well-nourished.  Psych: Affect appropriate to situation Eyes: No scleral injection Neuro: AOx3, cranial nerves II to XII appear grossly intact, antigravity strength in upper extremities without drift, left finger-nose ataxia, right finger-to-nose no ataxia   I have reviewed labs in epic  and the results pertinent to this consultation are: CBC:  Recent Labs  Lab 03/23/22 1040 03/24/22 0359 03/25/22 0501  WBC 12.5* 7.4 8.6  NEUTROABS 8.7*  --   --   HGB 9.8* 9.3* 10.0*  HCT 30.8* 28.8* 31.5*  MCV 93.3 92.3 93.2  PLT 260 253 132    Basic Metabolic Panel:  Lab Results  Component Value Date   NA 140 03/25/2022   K 3.2 (L) 03/25/2022   CO2 25 03/25/2022   GLUCOSE 86 03/25/2022   BUN 20 03/25/2022   CREATININE 1.36 (H) 03/25/2022   CALCIUM 9.0 03/25/2022   GFRNONAA 40 (L) 03/25/2022   GFRAA 53 (L) 07/18/2018   Lipid Panel: No results found for: "LDLCALC" HgbA1c: No results found for: "HGBA1C" Urine Drug Screen: No results found for: "LABOPIA", "COCAINSCRNUR", "LABBENZ", "AMPHETMU", "THCU", "LABBARB"  Alcohol Level     Component Value Date/Time   ETH 43 (H) 05/05/2017 0440     I have reviewed the images obtained:  CT without contrast 04/06/2022: 1. 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 The majority of these infarcts appear acute/subacute. Consider a brain MRI for further evaluation. 2. Advanced chronic small vessel given changes within the cerebral white matter. 3. Moderate to advanced generalized cerebral atrophy. 4. Comparatively mild cerebellar atrophy. 5. Severe paranasal sinus disease, as described. 6. Partially imaged cervical posterior spinal fusion construct. Lucency surrounding the bilateral C1 lateral  mass screws, suggesting hardware loosening.  ASSESSMENT/PLAN: 76 year old female who presented to the hospital with shortness of breath, abdominal discomfort and was diagnosed with atrial fibrillation.  While in the hospital patient was also noted to have bilateral upper extremity weakness and therefore CTA was ordered which showed bilateral cerebellar hemisphere strokes.  Subacute ischemic strokes -Most likely cardioembolic  Recommendations: -Would recommend starting Eliquis for stroke prevention.  Per cardiology, they would recommend 2.5 mg twice daily due to fluctuating creatinine levels.  I agree with this recommendation.  However, awaiting MRI brain to help decide the timing of initiation of anticoagulation -MRI brain, MRA head and neck ordered and pending -Lipid panel ordered and pending.  If LDL more than 75, recommend starting atorvastatin 40 mg daily. -Please notify neurology for any acute neurological change -Discussed plan with patient, patient's friend at bedside and Dr. Manuella Ghazi via secure chat  Thank you for allowing [place] to participate in the care of this patient. If you have any further questions, please contact  me or neurohospitalist.   Zeb Comfort Epilepsy [facility] neurohospitalist

## 2022-03-25 NOTE — Progress Notes (Signed)
Speech Language Pathology Treatment: Dysphagia  Patient Details Name: Jordan [name] MRN: [id number] DOB: [birth year] Today's Date: 03/25/2022 Time: 8676-7209 SLP Time Calculation (min) (ACUTE ONLY): 23 min  Assessment / Plan / Recommendation Clinical Impression  Pt seen for ongoing dysphagia intervention following clinical swallow evaluation completed yesterday. Pt still showing signs of aspiration with thin liquids this date, but she reports subjective improvement with swallow. Pt typically self feeds and is unable at this time. Will proceed with MBSS this afternoon.    HPI HPI: Jordan [name] is a 76 y.o. female with medical history significant for neuromuscular dysfunction of bladder and colon, CKD stage IIIb, hypertension, dyslipidemia, IBS, COPD, prior CVA, depression, osteoporosis, lumbar compression fracture, and hemorrhoids who presented to the ED from her SNF on account of worsening nausea and shortness of breath as well as some abdominal pain.  Patient was admitted with new onset paroxysmal atrial fibrillation with RVR with associated acute hypoxemic respiratory failure as well as UTI. BSE requested.      SLP Plan  MBS      Recommendations for follow up therapy are one component of a multi-disciplinary discharge planning process, led by the attending physician.  Recommendations may be updated based on patient status, additional functional criteria and insurance authorization.    Recommendations  Diet recommendations: Dysphagia 2 (fine chop);Nectar-thick liquid Liquids provided via: Cup Medication Administration: Whole meds with puree Supervision: Staff to assist with self feeding;Full supervision/cueing for compensatory strategies Compensations: Slow rate;Small sips/bites Postural Changes and/or Swallow Maneuvers: Seated upright 90 degrees;Upright 30-60 min after meal                Oral Care Recommendations: Oral care BID;Staff/trained caregiver to provide oral  care Follow Up Recommendations: Skilled nursing-short term rehab (<3 hours/day) Assistance recommended at discharge: Frequent or constant Supervision/Assistance SLP Visit Diagnosis: Dysphagia, unspecified (R13.10) Plan: MBS         Thank you,  Genene Churn, [place]   [place]  03/25/2022, 1:16 PM

## 2022-03-26 DIAGNOSIS — I4891 Unspecified atrial fibrillation: Secondary | ICD-10-CM | POA: Diagnosis not present

## 2022-03-26 LAB — BASIC METABOLIC PANEL
Anion gap: 6 (ref 5–15)
BUN: 19 mg/dL (ref 8–23)
CO2: 26 mmol/L (ref 22–32)
Calcium: 9.3 mg/dL (ref 8.9–10.3)
Chloride: 109 mmol/L (ref 98–111)
Creatinine, Ser: 1.38 mg/dL — ABNORMAL HIGH (ref 0.44–1.00)
GFR, Estimated: 40 mL/min — ABNORMAL LOW (ref >60)
Glucose, Bld: 109 mg/dL — ABNORMAL HIGH (ref 70–99)
Potassium: 4 mmol/L (ref 3.5–5.1)
Sodium: 141 mmol/L (ref 135–145)

## 2022-03-26 LAB — LIPID PANEL
Cholesterol: 155 mg/dL (ref 0–200)
HDL: 58 mg/dL (ref >40)
LDL Cholesterol: 79 mg/dL (ref 0–99)
Total CHOL/HDL Ratio: 2.7 RATIO
Triglycerides: 90 mg/dL (ref <150)
VLDL: 18 mg/dL (ref 0–40)

## 2022-03-26 LAB — CBC
HCT: 33.1 % — ABNORMAL LOW (ref 36.0–46.0)
Hemoglobin: 10.5 g/dL — ABNORMAL LOW (ref 12.0–15.0)
MCH: 29.5 pg (ref 26.0–34.0)
MCHC: 31.7 g/dL (ref 30.0–36.0)
MCV: 93 fL (ref 80.0–100.0)
Platelets: 257 10*3/uL (ref 150–400)
RBC: 3.56 MIL/uL — ABNORMAL LOW (ref 3.87–5.11)
RDW: 14.3 % (ref 11.5–15.5)
WBC: 9.8 10*3/uL (ref 4.0–10.5)
nRBC: 0 % (ref 0.0–0.2)

## 2022-03-26 LAB — MAGNESIUM: Magnesium: 1.9 mg/dL (ref 1.7–2.4)

## 2022-03-26 MED ORDER — ONDANSETRON HCL 4 MG PO TABS: 4.0000 mg | ORAL_TABLET | Freq: Four times a day (QID) | ORAL | 0 refills | Status: AC | PRN

## 2022-03-26 MED ORDER — CEPHALEXIN 250 MG PO CAPS
250.0000 mg | ORAL_CAPSULE | Freq: Two times a day (BID) | ORAL | 0 refills | Status: AC
Start: 2022-03-26 — End: 2022-03-28

## 2022-03-26 MED ORDER — MEGESTROL ACETATE 400 MG/10ML PO SUSP: 400.0000 mg | Freq: Every day | ORAL | 0 refills | Status: AC

## 2022-03-26 MED ORDER — MEGESTROL ACETATE 400 MG/10ML PO SUSP: 400.0000 mg | Freq: Every day | ORAL | Status: DC

## 2022-03-26 MED ORDER — APIXABAN 2.5 MG PO TABS: 2.5000 mg | ORAL_TABLET | Freq: Two times a day (BID) | ORAL | 1 refills | Status: DC

## 2022-03-26 MED ORDER — APIXABAN 2.5 MG PO TABS: 2.5000 mg | ORAL_TABLET | Freq: Two times a day (BID) | ORAL | Status: DC

## 2022-03-26 MED ORDER — DILTIAZEM HCL ER COATED BEADS 180 MG PO CP24: 180.0000 mg | ORAL_CAPSULE | Freq: Every day | ORAL | 1 refills | Status: AC

## 2022-03-26 MED ORDER — CEPHALEXIN 250 MG PO CAPS
250.0000 mg | ORAL_CAPSULE | Freq: Two times a day (BID) | ORAL | Status: DC
Start: 2022-03-26 — End: 2022-03-26
  Administered 2022-03-26: 250 mg via ORAL
  Filled 2022-03-26: qty 1

## 2022-03-26 NOTE — Progress Notes (Signed)
Tele reviewed, rate controlled afib. Continue dilti 180 and lopressor '25mg'$  bid, start eliquis 2.'5mg'$  bid when ok from primary team standpoint. No further recs, we will arrange outpatient f/u and signoff inpatient care   Carlyle Dolly MD

## 2022-03-26 NOTE — Progress Notes (Signed)
Report given to [place] at [facility]. No further questions at this time. EMS called by receptionist.

## 2022-03-26 NOTE — TOC Transition Note (Signed)
Transition of Care [facility]) - CM/SW Discharge Note   Patient Details  Name: [name] MRN: [id number] Date of Birth: June 16, 1946  [facility] CM/SW Contact:  Ihor Gully, LCSW Phone Number: 03/26/2022, 1:12 PM   Clinical Narrative:    D/c clinicals sent to facility. TOC signing off.   Final next level of care: Long Term Nursing Home Barriers to Discharge: No Barriers Identified   Patient Goals and CMS Choice Patient states their goals for this hospitalization and ongoing recovery are:: return to LTC CMS Medicare.gov Compare Post Acute Care list provided to:: Patient Choice offered to / list presented to : Patient  Discharge Placement              Patient chooses bed at: Other - please specify in the comment section below: [facility]) Patient to be transferred to facility by: [place] Name of family member notified: messages left for friend and brother listed on chart Patient and family notified of of transfer: 03/26/22  Discharge Plan and Services     Post Acute Care Choice: Nursing Home                               Social Determinants of Health (SDOH) Interventions     Readmission Risk Interventions    03/24/2022   10:32 AM  Readmission Risk Prevention Plan  Transportation Screening Complete  Home Care Screening Complete  Medication Review (RN CM) Complete

## 2022-03-26 NOTE — Care Management Important Message (Signed)
Important Message  Patient Details  Name: Jordan [name] MRN: [id number] Date of Birth: 1946/06/29   Medicare Important Message Given:  N/A - LOS <3 / Initial given by admissions     Tommy Medal 03/26/2022, 11:17 AM

## 2022-03-26 NOTE — Discharge Summary (Signed)
Physician Discharge Summary   Patient: Jordan [name] MRN: [id number] DOB: December 17, 1945  Admit date:     03/23/2022  Discharge date: 03/26/22  Discharge Physician: Deatra James   PCP: Caprice Renshaw, MD   Recommendations at discharge:   - Aggressive PT OT, fall precautions -Continue current antibiotic -Follow with her PCP and cardiologist within 1-2 weeks  Discharge Diagnoses: Principal Problem:   New onset a-fib [facility]) Active Problems:   Malnutrition of moderate degree   Jordan [name] is a 76 y.o. female with medical history significant for neuromuscular dysfunction of bladder and colon, CKD stage IIIb, hypertension, dyslipidemia, IBS, COPD, prior CVA, depression, osteoporosis, lumbar compression fracture, and hemorrhoids who presented to the ED from her SNF on account of worsening nausea and shortness of breath as well as some abdominal pain.  Patient was admitted with new onset paroxysmal atrial fibrillation with RVR with associated acute hypoxemic respiratory failure as well as UTI.  She was noted to have worsening arm weakness on 7/18 as well as dysphagia which prompted CT head that is now demonstrating subacute ischemic strokes.  MRI/MRA pending and neurology consulted.  Atrial fibrillation with RVR improved and medication dosing adjusted per cardiology.   Assessment & Plan:   Principal Problem:   New onset a-fib ([place])   Assessment and Plan:     New onset paroxysmal atrial fibrillation with RVR -TSH one-point eight 2D echocardiogram without any significant abnormalities LVEF 60-65% -IV diltiazem changed to oral diltiazem per cardiology, consolidated to 180 mg once daily starting 7/19 -CHA2DS2-VASc score of 4 or greater, will start on full dose Lovenox for anticoagulation Cardiology Dr. Harl Bowie was consulted: Final recommended home medication diltiazem 180 mg daily, Lopressor 25 mg p.o. twice daily, Eliquis 2.5 mg twice daily Follow-up as an outpatient     Acute hypoxemic  respiratory failure secondary to pulmonary edema related to above -Hold further diuresis  -Successfully weaned off oxygen--satting 94% on room air    Subacute CVAs -Likely cardioembolic in the setting of new onset atrial fibrillation with RVR -MRI brain and MRA head and neck ordered and pending -Likely start Eliquis 2.5 mg twice daily, but await MRI results to determine timing of initiation   Proteus UTI -DC Rocephin after today as 3 doses >>>> switch to p.o. Keflex for 2 more days.   CKD stage IIIb -Currently stable and at baseline, monitor while on Lasix for diuresis   Essential hypertension -Currently with stable blood pressure readings -Continue metoprolol for now and hold amlodipine   COPD -No acute bronchospasms -DuoNebs as needed   New 50% compression fracture in the setting of prior compression fractures/osteoporosis -No complaints of new back pain noted -Continue to monitor   Chronic anemia -Currently stable -Continue to monitor CBC   Neuromuscular dysfunction of bladder and colon -Lives in SNF   Dysphagia -Dysphagia diet and SLP evaluation--- recommended dysphagia 1 Aspiration precaution   SLP Diet Recommendations: Dysphagia 1 (Puree) solids;Honey thick liquids;Alternative means - temporary;Ice chips PRN after oral care    Liquid Administration via: Cup;No straw    Medication Administration: Whole meds with puree    Supervision: Full assist for feeding;Staff to assist with self feeding;Full supervision/cueing for compensatory strategies  Code Status: Full Family Communication: None at bedside Disposition Plan:        Consultants: Speech/neurology, cardiology Procedures performed: Modified swallow evaluation Disposition: Skilled nursing facility Diet recommendation:  Discharge Diet Orders (From admission, onward)     Start     Ordered  03/26/22 0000  Diet - low sodium heart healthy        03/26/22 1212           Cardiac and Carb modified  diet DISCHARGE MEDICATION: Allergies as of 03/26/2022       Reactions   Cardura [doxazosin Mesylate] Hives   Cardura [doxazosin]    Strawberry Extract    strawberries   Barbiturates Nausea And Vomiting, Rash   Levofloxacin Rash   Sulfa Antibiotics Nausea Only, Rash        Medication List     STOP taking these medications    amLODipine 2.5 MG tablet Commonly known as: NORVASC   colestipol 1 g tablet Commonly known as: COLESTID   dicyclomine 10 MG capsule Commonly known as: BENTYL   predniSONE 5 MG tablet Commonly known as: DELTASONE   vitamin C 500 MG tablet Commonly known as: ASCORBIC ACID       TAKE these medications    apixaban 2.5 MG Tabs tablet Commonly known as: ELIQUIS Take 1 tablet (2.5 mg total) by mouth 2 (two) times daily. Start taking on: March 27, 2022   B complex-vitamin C-folic acid 1 MG tablet Take 1 tablet by mouth daily with breakfast.   Breo Ellipta 100-25 MCG/ACT Aepb Generic drug: fluticasone furoate-vilanterol Inhale 1 puff into the lungs daily.   cephALEXin 250 MG capsule Commonly known as: KEFLEX Take 1 capsule (250 mg total) by mouth every 12 (twelve) hours for 2 days.   diltiazem 180 MG 24 hr capsule Commonly known as: CARDIZEM CD Take 1 capsule (180 mg total) by mouth daily. Start taking on: March 27, 2022   FLUoxetine 40 MG capsule Commonly known as: PROZAC Take 40 mg by mouth daily.   gabapentin 100 MG capsule Commonly known as: NEURONTIN Take 100 mg by mouth daily.   Incruse Ellipta 62.5 MCG/ACT Aepb Generic drug: umeclidinium bromide Inhale 1 puff into the lungs daily.   iron polysaccharides 150 MG capsule Commonly known as: NIFEREX Take 1 capsule by mouth daily.   lidocaine 5 % Commonly known as: LIDODERM Place 1 patch onto the skin.   megestrol 400 MG/10ML suspension Commonly known as: MEGACE Take 10 mLs (400 mg total) by mouth daily.   melatonin 3 MG Tabs tablet Take 6 mg by mouth at bedtime.    metoprolol tartrate 25 MG tablet Commonly known as: LOPRESSOR Take 1 tablet (25 mg total) by mouth 2 (two) times daily.   montelukast 10 MG tablet Commonly known as: SINGULAIR Take 10 mg by mouth daily.   ondansetron 4 MG tablet Commonly known as: ZOFRAN Take 1 tablet (4 mg total) by mouth every 6 (six) hours as needed for nausea.   oxyCODONE 5 MG immediate release tablet Commonly known as: Oxy IR/ROXICODONE Take 5 mg by mouth every 4 (four) hours as needed.   Vitamin D2 50 MCG (2000 UT) Tabs Take 1 tablet by mouth daily.        Contact information for follow-up providers     Erma Heritage, PA-C Follow up on 04/11/2022.   Specialties: Physician Assistant, Cardiology Why: Cardiology Follow-up on 04/11/2022 at 2:30 PM. Contact information: [place] [address] [phone]              Contact information for after-discharge care     [facility] .   Service: Skilled Chiropodist information: [address] [place] [phone]  Discharge Exam: Filed Weights   03/23/22 1334 03/23/22 1450  Weight: 45.4 kg 42.3 kg      Physical Exam:   General:  AAO x 3,  cooperative, no distress;   HEENT:  Normocephalic, PERRL, otherwise with in Normal limits   Neuro:  CNII-XII intact. , normal motor and sensation, reflexes intact   Lungs:   Clear to auscultation BL, Respirations unlabored,  No wheezes / crackles  Cardio:    S1/S2, RRR, No murmure, No Rubs or Gallops   Abdomen:  Soft, non-tender, bowel sounds active all four quadrants, no guarding or peritoneal signs.  Muscular  skeletal:  Limited exam -global generalized weaknesses - in bed, able to move all 4 extremities,   2+ pulses,  symmetric, No pitting edema  Skin:  Dry, warm to touch, negative for any Rashes,  Wounds: Please see nursing documentation  Pressure Injury 07/06/18 Stage I  -  Intact skin with non-blanchable redness of a localized area usually over a bony prominence. reddened nonblanchable and boggy heel (Active)  07/06/18 2342  Location: Heel  Location Orientation: Left  Staging: Stage I -  Intact skin with non-blanchable redness of a localized area usually over a bony prominence.  Wound Description (Comments): reddened nonblanchable and boggy heel  Present on Admission:      Pressure Injury 07/06/18 Stage I -  Intact skin with non-blanchable redness of a localized area usually over a bony prominence. reddened, nonblanchable, boggy heel (Active)  07/06/18 2346  Location: Heel  Location Orientation: Right  Staging: Stage I -  Intact skin with non-blanchable redness of a localized area usually over a bony prominence.  Wound Description (Comments): reddened, nonblanchable, boggy heel  Present on Admission:      Pressure Injury Unstageable - Full thickness tissue loss in which the base of the ulcer is covered by slough (yellow, tan, gray, green or brown) and/or eschar (tan, brown or black) in the wound bed. scabbed area to the outter ankle,  surrounding skin is  (Active)     Location: Ankle  Location Orientation: Anterior;Right;Lateral (outter)  Staging: Unstageable - Full thickness tissue loss in which the base of the ulcer is covered by slough (yellow, tan, gray, green or brown) and/or eschar (tan, brown or black) in the wound bed.  Wound Description (Comments): scabbed area to the outter ankle,  surrounding skin is nonblanchable  Present on Admission:           Condition at discharge: fair  The results of significant diagnostics from this hospitalization (including imaging, microbiology, ancillary and laboratory) are listed below for reference.   Imaging Studies: MR BRAIN WO CONTRAST  Result Date: 03/25/2022 CLINICAL DATA:  Neuro deficit with acute stroke suspected EXAM: MRI HEAD WITHOUT CONTRAST MRA HEAD WITHOUT CONTRAST MRA NECK WITHOUT CONTRAST  TECHNIQUE: Multiplanar, multiecho pulse sequences of the brain and surrounding structures were obtained without intravenous contrast. Angiographic images of the Circle of Willis were obtained using MRA technique without intravenous contrast. Angiographic images of the neck were obtained using MRA technique without intravenous contrast. Carotid stenosis measurements (when applicable) are obtained utilizing NASCET criteria, using the distal internal carotid diameter as the denominator. COMPARISON:  Head CT from earlier today FINDINGS: MRI HEAD FINDINGS Brain: Restrict diffusion in the left upper cerebellum and left posterior midbrain. Small remote infarcts in the right cerebellum. Confluent chronic small vessel ischemia in the cerebral white matter and pons. Cerebral volume loss which is generalized and moderate for age. No acute hemorrhage, hydrocephalus, or  masslike finding. Vascular: Arterial findings below.  No abnormal flow voids Skull and upper cervical spine: No focal marrow lesion. Extensive cervical spine fusion. Sinuses/Orbits: Confluent bilateral sinus opacification with inspissated material in the maxillary sinuses bulging into the nasal cavity. Bilateral cataract resection. MRA HEAD FINDINGS Carotid arteries are widely patent. No anterior circulation branch occlusion, beading, or flow limiting stenosis. Hypoplastic left A1 segment. Left dominant vertebral artery. Extensive atheromatous irregularity of the non dominant right vertebral artery with high-grade narrowing before the basilar. A left superior cerebellar artery is cut off compared to the right. No PCA branch occlusion or beading. Hypoplastic left P1 segment. MRA NECK FINDINGS Antegrade flow in the carotid and vertebral arteries by time-of-flight technique. The arch and proximal great vessels are not covered. Some aspects of the vertebral arteries are obscured due to metallic artifact from cervical spine hardware. No evidence of vertebral stenosis  or dissection. The anterior circulation vessels show no flow limiting stenosis or ulceration in the carotid or internal carotid arteries. The proximal left common carotid is not well visualized, but motion at this level. IMPRESSION: Brain MRI: 1. Acute left superior cerebellar infarct. 2. Advanced chronic small vessel ischemia. Remote right cerebellar infarcts. 3. Advanced chronic sinusitis. Head MRA: 1. Left superior cerebellar branch cut off in keeping with the above. 2. Advanced atheromatous irregularity and narrowing of the non dominant right vertebral artery. Neck MRA: No flow limiting stenosis or ulceration of major cervical vessels. Electronically Signed   By: Jorje Guild M.D.   On: 03/25/2022 17:33   MR ANGIO HEAD WO CONTRAST  Result Date: 03/25/2022 CLINICAL DATA:  Neuro deficit with acute stroke suspected EXAM: MRI HEAD WITHOUT CONTRAST MRA HEAD WITHOUT CONTRAST MRA NECK WITHOUT CONTRAST TECHNIQUE: Multiplanar, multiecho pulse sequences of the brain and surrounding structures were obtained without intravenous contrast. Angiographic images of the Circle of Willis were obtained using MRA technique without intravenous contrast. Angiographic images of the neck were obtained using MRA technique without intravenous contrast. Carotid stenosis measurements (when applicable) are obtained utilizing NASCET criteria, using the distal internal carotid diameter as the denominator. COMPARISON:  Head CT from earlier today FINDINGS: MRI HEAD FINDINGS Brain: Restrict diffusion in the left upper cerebellum and left posterior midbrain. Small remote infarcts in the right cerebellum. Confluent chronic small vessel ischemia in the cerebral white matter and pons. Cerebral volume loss which is generalized and moderate for age. No acute hemorrhage, hydrocephalus, or masslike finding. Vascular: Arterial findings below.  No abnormal flow voids Skull and upper cervical spine: No focal marrow lesion. Extensive cervical spine  fusion. Sinuses/Orbits: Confluent bilateral sinus opacification with inspissated material in the maxillary sinuses bulging into the nasal cavity. Bilateral cataract resection. MRA HEAD FINDINGS Carotid arteries are widely patent. No anterior circulation branch occlusion, beading, or flow limiting stenosis. Hypoplastic left A1 segment. Left dominant vertebral artery. Extensive atheromatous irregularity of the non dominant right vertebral artery with high-grade narrowing before the basilar. A left superior cerebellar artery is cut off compared to the right. No PCA branch occlusion or beading. Hypoplastic left P1 segment. MRA NECK FINDINGS Antegrade flow in the carotid and vertebral arteries by time-of-flight technique. The arch and proximal great vessels are not covered. Some aspects of the vertebral arteries are obscured due to metallic artifact from cervical spine hardware. No evidence of vertebral stenosis or dissection. The anterior circulation vessels show no flow limiting stenosis or ulceration in the carotid or internal carotid arteries. The proximal left common carotid is not well visualized, but motion  at this level. IMPRESSION: Brain MRI: 1. Acute left superior cerebellar infarct. 2. Advanced chronic small vessel ischemia. Remote right cerebellar infarcts. 3. Advanced chronic sinusitis. Head MRA: 1. Left superior cerebellar branch cut off in keeping with the above. 2. Advanced atheromatous irregularity and narrowing of the non dominant right vertebral artery. Neck MRA: No flow limiting stenosis or ulceration of major cervical vessels. Electronically Signed   By: Jorje Guild M.D.   On: 03/25/2022 17:33   MR ANGIO NECK WO CONTRAST  Result Date: 03/25/2022 CLINICAL DATA:  Neuro deficit with acute stroke suspected EXAM: MRI HEAD WITHOUT CONTRAST MRA HEAD WITHOUT CONTRAST MRA NECK WITHOUT CONTRAST TECHNIQUE: Multiplanar, multiecho pulse sequences of the brain and surrounding structures were obtained  without intravenous contrast. Angiographic images of the Circle of Willis were obtained using MRA technique without intravenous contrast. Angiographic images of the neck were obtained using MRA technique without intravenous contrast. Carotid stenosis measurements (when applicable) are obtained utilizing NASCET criteria, using the distal internal carotid diameter as the denominator. COMPARISON:  Head CT from earlier today FINDINGS: MRI HEAD FINDINGS Brain: Restrict diffusion in the left upper cerebellum and left posterior midbrain. Small remote infarcts in the right cerebellum. Confluent chronic small vessel ischemia in the cerebral white matter and pons. Cerebral volume loss which is generalized and moderate for age. No acute hemorrhage, hydrocephalus, or masslike finding. Vascular: Arterial findings below.  No abnormal flow voids Skull and upper cervical spine: No focal marrow lesion. Extensive cervical spine fusion. Sinuses/Orbits: Confluent bilateral sinus opacification with inspissated material in the maxillary sinuses bulging into the nasal cavity. Bilateral cataract resection. MRA HEAD FINDINGS Carotid arteries are widely patent. No anterior circulation branch occlusion, beading, or flow limiting stenosis. Hypoplastic left A1 segment. Left dominant vertebral artery. Extensive atheromatous irregularity of the non dominant right vertebral artery with high-grade narrowing before the basilar. A left superior cerebellar artery is cut off compared to the right. No PCA branch occlusion or beading. Hypoplastic left P1 segment. MRA NECK FINDINGS Antegrade flow in the carotid and vertebral arteries by time-of-flight technique. The arch and proximal great vessels are not covered. Some aspects of the vertebral arteries are obscured due to metallic artifact from cervical spine hardware. No evidence of vertebral stenosis or dissection. The anterior circulation vessels show no flow limiting stenosis or ulceration in the  carotid or internal carotid arteries. The proximal left common carotid is not well visualized, but motion at this level. IMPRESSION: Brain MRI: 1. Acute left superior cerebellar infarct. 2. Advanced chronic small vessel ischemia. Remote right cerebellar infarcts. 3. Advanced chronic sinusitis. Head MRA: 1. Left superior cerebellar branch cut off in keeping with the above. 2. Advanced atheromatous irregularity and narrowing of the non dominant right vertebral artery. Neck MRA: No flow limiting stenosis or ulceration of major cervical vessels. Electronically Signed   By: Jorje Guild M.D.   On: 03/25/2022 17:33   DG Swallowing Func-Speech Pathology  Result Date: 03/25/2022 Table formatting from the original result was not included. Objective Swallowing Evaluation: Type of Study: MBS-Modified Barium Swallow Study  Patient Details Name: CHAIRTY TOMAN MRN: [id number] Date of Birth: 12-04-1945 Today's Date: 03/25/2022 Time: SLP Start Time (ACUTE ONLY): 1326 -SLP Stop Time (ACUTE ONLY): 1401 SLP Time Calculation (min) (ACUTE ONLY): 35 min Past Medical History: Past Medical History: Diagnosis Date  Allergy   seasonal  Anemia   Arthritis   Asthma   uses Advair and Spiriva daily,Albuterol prn.Takes Singulair at bedtime and Prednisone daily  Back spasm  takes Zanaflex daily as needed  Blood transfusion 2007  no abnormal reation noted   Bruises easily   d/t being on Prednisone  Bursitis   left elbow  C. difficile colitis   2007: treated with Flagyl and Vanc, 2014: treated with vanc, Aug 2014: Vanc taper  Chronic back pain   Chronic kidney disease   "creatine" creeping up - followed at this time by PCP  Complication of anesthesia 2000  woke up during one surgery- ovary surgery  COPD (chronic obstructive pulmonary disease) ([place])   Depression   takes Prozac daily  Dysphasia   Falls   H/O hiatal hernia   Hemorrhoids   History of bronchitis   last time a yr ago  History of colon polyps   History of kidney stones   History of  kidney stones   Hyperlipidemia   hx of-was on meds but has been off x 1 1/2 yrs  Hypertension   takes Verapamil nightly  IBS (irritable bowel syndrome)   Insomnia   takes Benadryl as needed  Myocardial infarction [facility]) 2007  Takotsubo cardiomyopathy  Neurogenic bowel   Neuromuscular dysfunction of bladder, unspecified   Nocturia   Osteoporosis   takes Fosamax weekly  Pneumonia   MRSA pneumonia in 2007  Shortness of breath   with exertion daily  Stroke [facility])   "they say I has a small stroke".No deficits  Tingling   and pain in left leg  Urinary frequency  Past Surgical History: Past Surgical History: Procedure Laterality Date  APPENDECTOMY    BIOPSY  04/13/2017  Procedure: BIOPSY;  Surgeon: Daneil Dolin, MD;  [facility];  Service: Endoscopy;;  right and left colon   BIOPSY  07/06/2018  Procedure: BIOPSY;  Surgeon: Danie Binder, MD;  [facility];  Service: Endoscopy;;  Random Colon  CARPAL TUNNEL RELEASE Left   CARPAL TUNNEL RELEASE Right 09/21/2015  Procedure: CARPAL TUNNEL RELEASE;  Surgeon: Carole Civil, MD;  Location: [facility];  Service: Orthopedics;  Laterality: Right;  cataract surgery Bilateral   CHOLECYSTECTOMY    COLONOSCOPY  09/24/2006  RMR: Normal rectum, left-sided diverticula  COLONOSCOPY  06/2011  Dr. Henrene Pastor: tubular adenomas, moderative diverticulosis, internal hemorrhoids  COLONOSCOPY WITH PROPOFOL N/A 04/13/2017  Dr. Gala Romney: internal hemorrhoids, diverticulosis  CYSTOSCOPY    ENDARTERECTOMY Right 12/12/2015  Procedure: ENDARTERECTOMY RIGHT CAROTID, RESECTION OF REDUNDANT RIGHT COMMON CAROTID ARTERY WITH PRIMARY REANASTOMOSIS;  Surgeon: Mal Misty, MD;  [facility];  Service: Vascular;  Laterality: Right;  ESOPHAGOGASTRODUODENOSCOPY  04/07/2006  FTD:DUKGUR esophagus s/p placement of Bravo pH probe, some surgical changes but not typical of fundoplication wrap, may have slipped  FLEXIBLE SIGMOIDOSCOPY N/A 07/06/2018  Procedure: FLEXIBLE SIGMOIDOSCOPY WITH  PROPOFOL;  Surgeon: Danie Binder, MD;  [facility];  Service: Endoscopy;  Laterality: N/A;  needs to be done first before the other inpatient   GANGLION CYST EXCISION Right 09/21/2015  Procedure: RIGHT WRIST GANGLION CYST REMOVAL;  Surgeon: Carole Civil, MD;  Location: [facility];  Service: Orthopedics;  Laterality: Right;  hemorrhoidal banding    HERNIA REPAIR    umbilical  KNEE ARTHROSCOPY Right   KNEE SURGERY Right   KYPHOPLASTY N/A 02/23/2018  Procedure: KYPHOPLASTY LUMBAR FOUR;  Surgeon: Newman Pies, MD;  Location: [place];  Service: Neurosurgery;  Laterality: N/A;  LUMBAR LAMINECTOMY/DECOMPRESSION MICRODISCECTOMY Left 10/17/2013  Procedure: LUMBAR LAMINECTOMY/DECOMPRESSION MICRODISCECTOMY 1 LEVEL three/four;  Surgeon: Ophelia Charter, MD;  Location: [place] NEURO ORS;  Service: Neurosurgery;  Laterality: Left;  NECK SURGERY    fusion-   NISSEN FUNDOPLICATION    x 2  OLECRANON BURSECTOMY Left 01/11/2014  Procedure: OLECRANON BURSECTOMY;  Surgeon: Carole Civil, MD;  Location[facility];  Service: Orthopedics;  Laterality: Left;  OOPHORECTOMY Right   PATCH ANGIOPLASTY Right 12/12/2015  Procedure: PATCH ANGIOPLASTY RIGHT CAROTID ARTERY USING HEMASHIELD PLATINUM FINESSE PATCH;  Surgeon: Mal Misty, MD;  Location: [place];  Service: Vascular;  Laterality: Right;  TONSILLECTOMY   HPI: Jordan [name] is a 76 y.o. female with medical history significant for neuromuscular dysfunction of bladder and colon, CKD stage IIIb, hypertension, dyslipidemia, IBS, COPD, prior CVA, depression, osteoporosis, lumbar compression fracture, and hemorrhoids who presented to the ED from her SNF on account of worsening nausea and shortness of breath as well as some abdominal pain.  Patient was admitted with new onset paroxysmal atrial fibrillation with RVR with associated acute hypoxemic respiratory failure as well as UTI. She was noted to have worsening arm weakness on 7/18 as well as dysphagia which prompted CT head  that is now demonstrating subacute ischemic strokes.  MRI/MRA pending and neurology consulted. MBSS ordered.  Subjective: "It is a little better."  Recommendations for follow up therapy are one component of a multi-disciplinary discharge planning process, led by the attending physician.  Recommendations may be updated based on patient status, additional functional criteria and insurance authorization. Assessment / Plan / Recommendation   03/25/2022   4:00 PM Clinical Impressions Clinical Impression Pt presents with moderate/severe oropharyngeal dysphagia characterized by impaired lingual movement and impaired labial seal resulting in left labial spillage with liquids, premature spillage to the pyriforms with liquids; Pt with reduced tongue base retraction, epiglottic deflection, and laryngeal closure resulting in penetration/aspiration before and during the swallow with cup sips of thin and NTL which was sensed with a weak cough and not removed, and signifcant pooling after the swallow from lingual residuals spilling into the valleculae, lateral channels, and pyriforms. Pt also noted to have a prominent CP bar and also has cervical hardware from previous surgery. Pt's primary deficit appears related to gross weakness and fatigue at this time. MRI is pending. Recommend short term alternative means of nutrition vs allowing D1/puree and honey thick liquids. Pt has great difficulty initiating a second swallow (timewise). Once she does initiate it, is effective in reducing pharyngeal residue. Pt will need ongoing dysphagia intervention. Will downgrade diet to D1/puree and HTL via cup sips with feeder assist and po medications whole in puree, cue Pt to swallow 2-3x per bite/sip, d/c PO if too fatigued. SLP will f/u tomorrow. SLP Visit Diagnosis Dysphagia, oropharyngeal phase (R13.12) Impact on safety and function Moderate aspiration risk;Risk for inadequate nutrition/hydration     03/25/2022   4:00 PM Treatment  Recommendations Treatment Recommendations Therapy as outlined in treatment plan below     03/25/2022   4:00 PM Prognosis Prognosis for Safe Diet Advancement Fair Barriers to Reach Goals Severity of deficits   03/25/2022   4:00 PM Diet Recommendations SLP Diet Recommendations Dysphagia 1 (Puree) solids;Honey thick liquids;Alternative means - temporary;Ice chips PRN after oral care Liquid Administration via Cup;No straw Medication Administration Whole meds with puree Compensations Slow rate;Small sips/bites;Multiple dry swallows after each bite/sip;Clear throat intermittently;Effortful swallow Postural Changes Remain semi-upright after after feeds/meals (Comment);Seated upright at 90 degrees     03/25/2022   4:00 PM Other Recommendations Oral Care Recommendations Oral care BID;Staff/trained caregiver to provide oral care Other Recommendations Order thickener from pharmacy;Prohibited food (jello,  ice cream, thin soups);Clarify dietary restrictions Follow Up Recommendations Skilled nursing-short term rehab (<3 hours/day) Assistance recommended at discharge Frequent or constant Supervision/Assistance Functional Status Assessment Patient has had a recent decline in their functional status and demonstrates the ability to make significant improvements in function in a reasonable and predictable amount of time.   03/25/2022   4:00 PM Frequency and Duration  Speech Therapy Frequency (ACUTE ONLY) min 2x/week Treatment Duration 1 week     03/25/2022   4:00 PM Oral Phase Oral Phase Impaired Oral - Honey Teaspoon Weak lingual manipulation;Lingual/palatal residue Oral - Honey Cup Weak lingual manipulation;Lingual/palatal residue Oral - Nectar Teaspoon Lingual/palatal residue Oral - Thin Teaspoon Lingual/palatal residue;Left anterior bolus loss;Weak lingual manipulation;Premature spillage Oral - Thin Cup Left anterior bolus loss;Weak lingual manipulation;Lingual/palatal residue;Premature spillage;Decreased bolus cohesion Oral - Puree  Weak lingual manipulation;Incomplete tongue to palate contact;Lingual/palatal residue Oral - Mech Soft Lingual/palatal residue Oral - Pill --    03/25/2022   4:00 PM Pharyngeal Phase Pharyngeal Phase Impaired Pharyngeal- Honey Teaspoon Reduced epiglottic inversion;Reduced tongue base retraction;Pharyngeal residue - valleculae;Lateral channel residue;Pharyngeal residue - pyriform;Pharyngeal residue - cp segment Pharyngeal- Honey Cup Delayed swallow initiation-vallecula;Reduced epiglottic inversion;Reduced laryngeal elevation;Pharyngeal residue - valleculae;Pharyngeal residue - pyriform;Pharyngeal residue - cp segment Pharyngeal- Nectar Teaspoon Delayed swallow initiation-pyriform sinuses;Reduced epiglottic inversion;Reduced anterior laryngeal mobility;Reduced laryngeal elevation;Reduced airway/laryngeal closure;Reduced tongue base retraction;Penetration/Aspiration before swallow;Penetration/Aspiration during swallow;Trace aspiration;Moderate aspiration;Pharyngeal residue - valleculae;Pharyngeal residue - pyriform;Lateral channel residue Pharyngeal Material enters airway, passes BELOW cords and not ejected out despite cough attempt by patient Pharyngeal- Thin Teaspoon Delayed swallow initiation-pyriform sinuses;Reduced epiglottic inversion;Reduced anterior laryngeal mobility;Reduced laryngeal elevation;Pharyngeal residue - valleculae;Pharyngeal residue - pyriform Pharyngeal- Thin Cup Delayed swallow initiation-pyriform sinuses;Reduced epiglottic inversion;Reduced anterior laryngeal mobility;Reduced laryngeal elevation;Reduced airway/laryngeal closure;Penetration/Aspiration before swallow;Penetration/Aspiration during swallow;Trace aspiration;Moderate aspiration;Pharyngeal residue - valleculae;Pharyngeal residue - pyriform;Lateral channel residue Pharyngeal Material enters airway, passes BELOW cords and not ejected out despite cough attempt by patient Pharyngeal- Puree Delayed swallow initiation-vallecula;Reduced  epiglottic inversion;Reduced tongue base retraction;Pharyngeal residue - valleculae;Pharyngeal residue - pyriform;Lateral channel residue Pharyngeal- Mechanical Soft Delayed swallow initiation-vallecula;Pharyngeal residue - valleculae;Reduced tongue base retraction Pharyngeal- Pill [facility]    03/25/2022   4:00 PM Cervical Esophageal Phase  Cervical Esophageal Phase Impaired Honey Cup Prominent cricopharyngeal segment Cervical Esophageal Comment prominent cricopharyngeus Thank you, Genene Churn, [place] 03/25/2022, 4:50 PM                     CT HEAD WO CONTRAST (5MM)  Result Date: 03/25/2022 CLINICAL DATA:  Provided history: Neuro deficit, acute, stroke suspected. Additional history provided: UTI. EXAM: CT HEAD WITHOUT CONTRAST TECHNIQUE: Contiguous axial images were obtained from the base of the skull through the vertex without intravenous contrast. RADIATION DOSE REDUCTION: This exam was performed according to the departmental dose-optimization program which includes automated exposure control, adjustment of the mA and/or kV according to patient size and/or use of iterative reconstruction technique. COMPARISON:  Brain MRI 05/05/2017. FINDINGS: Brain: Moderate to advanced generalized cerebral atrophy. Comparatively mild cerebellar atrophy. Advanced patchy and ill-defined hypoattenuation within the cerebral white matter, nonspecific but compatible with chronic small vessel ischemic disease. Multiple infarcts within bilateral cerebellar hemispheres, new from the prior brain MRI of 05/05/2017. An infarct within the superior right cerebellar hemisphere appears subacute to chronic. The remaining infarcts appear acute/subacute. The largest infarct is located within the superior left cerebellar hemisphere, measuring 3 cm. There is no acute intracranial hemorrhage. No demarcated cortical infarct. No extra-axial fluid collection. No evidence of an intracranial mass.  No midline shift. Vascular: No  hyperdense vessel.  Atherosclerotic calcifications. Skull: No fracture or aggressive osseous lesion. Sinuses/Orbits: No mass or acute finding within the imaged orbits. Essentially complete opacification of the bilateral maxillary and sphenoid sinuses with associated chronic reactive osteitis. Extensive partial opacification of the bilateral ethmoid air cells (with associated chronic reactive osteitis). Mild mucosal thickening within the right frontal sinus. Other: Partially imaged cervical posterior spinal fusion construct. Partially imaged lucency surrounding the bilateral C1 lateral mass screws, suggesting hardware loosening (series 3, image 1). Impression #1 will be called to the ordering clinician or representative by the Radiologist Assistant, and communication documented in the PACS or [employer]. IMPRESSION: 1. 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 The majority of these infarcts appear acute/subacute. Consider a brain MRI for further evaluation. 2. Advanced chronic small vessel given changes within the cerebral white matter. 3. Moderate to advanced generalized cerebral atrophy. 4. Comparatively mild cerebellar atrophy. 5. Severe paranasal sinus disease, as described. 6. Partially imaged cervical posterior spinal fusion construct. Lucency surrounding the bilateral C1 lateral mass screws, suggesting hardware loosening. Electronically Signed   By: Kellie Simmering D.O.   On: 03/25/2022 10:13   ECHOCARDIOGRAM COMPLETE  Result Date: 03/24/2022    ECHOCARDIOGRAM REPORT   Patient Name:   ZENOLA DEZARN Date of Exam: 03/23/2022 Medical Rec #:  [id number]     Height:       64.0 in Accession #:    [id number]    Weight:       93.3 lb Date of Birth:  05/12/1946    BSA:          1.415 m Patient Age:    28 years      BP:           129/69 mmHg Patient Gender: F             HR:           87 bpm. Exam Location:  Inpatient Procedure: 2D Echo Indications:     atrial fibrillation  History:        Patient has prior history of Echocardiogram examinations, most                 recent 05/11/2009. History of Takotsubo, COPD and chronic kidney                 disease; Risk Factors:Hypertension.  Sonographer:    Johny Chess RDCS Referring Phys: [id number] [place] [facility]  Sonographer Comments: Technically difficult study due to poor echo windows. Image acquisition challenging due to respiratory motion. IMPRESSIONS  1. Cannot completely fully assess wall motion with technical challenges. Left ventricular ejection fraction, by estimation, is 60 to 65%. The left ventricle has normal function. The left ventricle has no regional wall motion abnormalities. Left ventricular diastolic parameters are indeterminate.  2. Right ventricular systolic function is normal. The right ventricular size is normal. There is normal pulmonary artery systolic pressure.  3. Left atrial size was mildly dilated.  4. Right atrial size was mildly dilated.  5. Posteriorly directed jet. The mitral valve was not well visualized. Mild mitral valve regurgitation.  6. The aortic valve is normal in structure. There is mild calcification of the aortic valve. Aortic valve regurgitation is not visualized.  7. The inferior vena cava is normal in size with greater than 50% respiratory variability, suggesting right atrial pressure of 3 mmHg.  8. Cannot exclude a small PFO.  FINDINGS  Left Ventricle: Cannot completely fully assess wall motion with technical challenges. Left ventricular ejection fraction, by estimation, is 60 to 65%. The left ventricle has normal function. The left ventricle has no regional wall motion abnormalities. The left ventricular internal cavity size was normal in size. There is no left ventricular hypertrophy. Left ventricular diastolic parameters are indeterminate. Right Ventricle: The right ventricular size is normal. No increase in right ventricular wall thickness. Right ventricular systolic  function is normal. There is normal pulmonary artery systolic pressure. The tricuspid regurgitant velocity is 2.81 m/s, and  with an assumed right atrial pressure of 3 mmHg, the estimated right ventricular systolic pressure is 84.5 mmHg. Left Atrium: Left atrial size was mildly dilated. Right Atrium: Right atrial size was mildly dilated. Prominent Chiari network. Pericardium: There is no evidence of pericardial effusion. Mitral Valve: Posteriorly directed jet. The mitral valve was not well visualized. Mild mitral valve regurgitation. Tricuspid Valve: The tricuspid valve is normal in structure. Tricuspid valve regurgitation is mild. Aortic Valve: The aortic valve is normal in structure. There is mild calcification of the aortic valve. Aortic valve regurgitation is not visualized. Pulmonic Valve: The pulmonic valve was not well visualized. Pulmonic valve regurgitation is not visualized. Aorta: The aortic root and ascending aorta are structurally normal, with no evidence of dilitation. Venous: The inferior vena cava is normal in size with greater than 50% respiratory variability, suggesting right atrial pressure of 3 mmHg. IAS/Shunts: Cannot exclude a small PFO.  LEFT VENTRICLE PLAX 2D LVIDd:         3.60 cm LVIDs:         2.40 cm LV PW:         0.90 cm LV IVS:        0.90 cm LVOT diam:     1.60 cm LVOT Area:     2.01 cm  IVC IVC diam: 2.00 cm LEFT ATRIUM           Index        RIGHT ATRIUM           Index LA diam:      3.80 cm 2.69 cm/m   RA Area:     18.80 cm LA Vol (A4C): 37.7 ml 26.65 ml/m  RA Volume:   51.00 ml  36.05 ml/m   AORTA Ao Root diam: 3.10 cm Ao Asc diam:  3.40 cm TRICUSPID VALVE TR Peak grad:   31.6 mmHg TR Vmax:        281.00 cm/s  SHUNTS Systemic Diam: 1.60 cm Phineas Inches Electronically signed by Phineas Inches Signature Date/Time: 03/24/2022/10:52:25 AM    Final    DG CHEST PORT 1 VIEW  Result Date: 03/24/2022 CLINICAL DATA:  Aspiration. EXAM: PORTABLE CHEST 1 VIEW COMPARISON:  03/23/2022  FINDINGS: 5:38 a.m. Similar enlargement of the heart silhouette but with interval near resolution of interstitial edema from the lower zones noted previously. There are trace pleural effusions. The lungs emphysematous with chronic right suprahilar scar-like opacities but no active pneumonia. The aorta is tortuous with atherosclerosis. There is osteopenia. Advanced asymmetric left shoulder DJD. IMPRESSION: Near-resolution of prior interstitial edema. Chronic change. Minimal pleural effusions. Electronically Signed   By: Telford Nab M.D.   On: 03/24/2022 07:48   CT Abdomen Pelvis W Contrast  Result Date: 03/23/2022 CLINICAL DATA:  76 year old female with acute abdominal and pelvic pain with nausea and vomiting. EXAM: CT ABDOMEN AND PELVIS WITH CONTRAST TECHNIQUE: Multidetector CT imaging of the abdomen and pelvis was  performed using the standard protocol following bolus administration of intravenous contrast. RADIATION DOSE REDUCTION: This exam was performed according to the departmental dose-optimization program which includes automated exposure control, adjustment of the mA and/or kV according to patient size and/or use of iterative reconstruction technique. CONTRAST:  56m OMNIPAQUE IOHEXOL 300 MG/ML  SOLN COMPARISON:  11/26/2021 and prior CTs FINDINGS: Lower chest: Trace bilateral pleural effusions are identified. Hepatobiliary: Liver is unchanged with no significant hepatic abnormalities noted. Patient is status post cholecystectomy. There is no evidence of intrahepatic or extrahepatic biliary dilatation. Pancreas: Unremarkable except for mild atrophy Spleen: Unremarkable Adrenals/Urinary Tract: A 5 mm calculus within the LEFT renal pelvis has migrated from the UPPER pole since the prior study. There is no evidence of hydronephrosis. 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 Bilateral renal atrophy and cortical thinning again noted. A punctate nonobstructing mid  LEFT renal calculus is present. No obstructing urinary calculi are identified. Stomach/Bowel: Stomach is within normal limits. No evidence of bowel wall thickening, distention, or inflammatory changes. Vascular/Lymphatic: Aortic atherosclerosis. No enlarged abdominal or pelvic lymph nodes. Reproductive: No significant abnormality. Other: No ascites, focal collection or pneumoperitoneum. Musculoskeletal: A new 50% compression fracture of the L2 SUPERIOR endplate is noted since 11/26/2021, with 2 mm bony retropulsion. Compression fractures of T10, T12, L1 and L4 with vertebral augmentation changes in L4 again identified. Multilevel degenerative disc disease/spondylosis and facet arthropathy again noted. IMPRESSION: 1. New 50% compression fracture of the L2 SUPERIOR endplate since 056/31/4970 with 2 mm bony retropulsion. 2. 5 mm LEFT renal calculus has migrated into the LEFT renal pelvis since the prior study. No evidence of hydronephrosis. 3. Trace bilateral pleural effusions. 4. Unchanged large RIGHT staghorn calculus and punctate nonobstructing LEFT renal calculus. 5. Aortic Atherosclerosis (ICD10-I70.0). Electronically Signed   By: JMargarette CanadaM.D.   On: 03/23/2022 11:35   DG Chest Port 1 View  Result Date: 03/23/2022 CLINICAL DATA:  Nausea, vomiting, and diarrhea. EXAM: PORTABLE CHEST 1 VIEW COMPARISON:  Acute abdominal series 07/04/2018 FINDINGS: Heart is upper limits of normal. Atherosclerotic changes are present at the aortic arch. Slight increase in the diffuse interstitial pattern is noted. No focal airspace consolidation is present. Advanced degenerative changes are noted in the shoulders. IMPRESSION: 1. Slight increase in diffuse interstitial pattern compatible with edema. 2. No focal airspace consolidation. Electronically Signed   By: CSan MorelleM.D.   On: 03/23/2022 10:44    Microbiology: Results for orders placed or performed during the hospital encounter of 03/23/22  Urine Culture      Status: Abnormal   Collection Time: 03/23/22 12:00 PM   Specimen: Urine, Clean Catch  Result Value Ref Range Status   Specimen Description   Final    URINE, CLEAN CATCH [facility] [address], [place] [address]   Special Requests   Final    NONE [facility] [address] [place] [id number]   Culture 30,000 COLONIES/mL PROTEUS MIRABILIS (A)  Final   Report Status 03/25/2022 FINAL  Final   Organism ID, Bacteria PROTEUS MIRABILIS (A)  Final      Susceptibility   Proteus mirabilis - MIC*    AMPICILLIN <=2 SENSITIVE Sensitive     CEFAZOLIN <=4 SENSITIVE Sensitive     CEFEPIME <=0.12 SENSITIVE Sensitive     CEFTRIAXONE <=0.25 SENSITIVE Sensitive     CIPROFLOXACIN <=0.25 SENSITIVE Sensitive     GENTAMICIN <=1 SENSITIVE Sensitive  IMIPENEM 1 SENSITIVE Sensitive     NITROFURANTOIN 128 RESISTANT Resistant     TRIMETH/SULFA <=20 SENSITIVE Sensitive     AMPICILLIN/SULBACTAM <=2 SENSITIVE Sensitive     PIP/TAZO <=4 SENSITIVE Sensitive     * 30,000 COLONIES/mL PROTEUS MIRABILIS  Culture, blood (Routine X 2) w Reflex to ID Panel     Status: None (Preliminary result)   Collection Time: 03/23/22  1:30 PM   Specimen: BLOOD RIGHT WRIST  Result Value Ref Range Status   Specimen Description   Final    BLOOD RIGHT WRIST BOTTLES DRAWN AEROBIC AND ANAEROBIC   Special Requests Blood Culture adequate volume  Final   Culture   Final    NO GROWTH 2 DAYS Performed at [facility], [address] [place], [place] [address]    Report Status PENDING  Incomplete  Culture, blood (Routine X 2) w Reflex to ID Panel     Status: None (Preliminary result)   Collection Time: 03/23/22  1:40 PM   Specimen: Right Antecubital; Blood  Result Value Ref Range Status   Specimen Description   Final    RIGHT ANTECUBITAL BOTTLES DRAWN AEROBIC AND ANAEROBIC   Special Requests   Final    Blood Culture results may not be optimal due to an excessive volume of blood  received in culture bottles   Culture   Final    NO GROWTH 2 DAYS Performed at [facility], [address], [place], [place] [address]    Report Status PENDING  Incomplete  MRSA Next Gen by PCR, Nasal     Status: None   Collection Time: 03/23/22  2:41 PM   Specimen: Nasal Mucosa; Nasal Swab  Result Value Ref Range Status   MRSA by PCR Next Gen NOT DETECTED NOT DETECTED Final    Comment: (NOTE) The GeneXpert MRSA Assay (FDA approved for NASAL specimens only), is one component of a comprehensive MRSA colonization surveillance program. It is not intended to diagnose MRSA infection nor to guide or monitor treatment for MRSA infections. Test performance is not FDA approved in patients less than 9 years old. Performed at [facility], [address], [place],  [address]     Labs: CBC: Recent Labs  Lab 03/23/22 [id number] 03/23/22 1040 03/24/22 0359 03/25/22 0501 03/26/22 0637  WBC 13.1* 12.5* 7.4 8.6 9.8  NEUTROABS  --  8.7*  --   --   --   HGB 9.9* 9.8* 9.3* 10.0* 10.5*  HCT 30.5* 30.8* 28.8* 31.5* 33.1*  MCV 91.3 93.3 92.3 93.2 93.0  PLT 274 260 253 227 194   Basic Metabolic Panel: Recent Labs  Lab 03/23/22 0938 03/24/22 0359 03/25/22 0501 03/26/22 0637  NA 137 140 140 141  K 3.9 3.6 3.2* 4.0  CL 106 107 105 109  CO2 '23 25 25 26  '$ GLUCOSE 170* 94 86 109*  BUN '16 17 20 19  '$ CREATININE 1.26* 1.32* 1.36* 1.38*  CALCIUM 8.8* 8.7* 9.0 9.3  MG  --  1.9 1.9 1.9   Liver Function Tests: Recent Labs  Lab 03/23/22 0938  AST 24  ALT 20  ALKPHOS 92  BILITOT 0.7  PROT 6.5  ALBUMIN 3.1*   CBG: No results for input(s): "GLUCAP" in the last 168 hours.  Discharge time spent: greater than 30 minutes.  Signed: Deatra James, MD [facility] 03/26/2022

## 2022-03-28 LAB — CULTURE, BLOOD (ROUTINE X 2)
Culture: NO GROWTH
Culture: NO GROWTH
Special Requests: ADEQUATE

## 2022-04-02 ENCOUNTER — Ambulatory Visit: Payer: Medicare Other | Admitting: Gastroenterology

## 2022-04-10 ENCOUNTER — Ambulatory Visit: Payer: Medicare Other | Admitting: Gastroenterology

## 2022-04-11 ENCOUNTER — Ambulatory Visit (INDEPENDENT_AMBULATORY_CARE_PROVIDER_SITE_OTHER): Payer: Medicare Other | Admitting: Student

## 2022-04-11 ENCOUNTER — Encounter: Payer: Self-pay | Admitting: Student

## 2022-04-11 VITALS — BP 98/62 | HR 62 | Ht 62.0 in | Wt 100.0 lb

## 2022-04-11 DIAGNOSIS — N1832 Chronic kidney disease, stage 3b: Secondary | ICD-10-CM | POA: Diagnosis not present

## 2022-04-11 DIAGNOSIS — I10 Essential (primary) hypertension: Secondary | ICD-10-CM | POA: Diagnosis not present

## 2022-04-11 DIAGNOSIS — I4819 Other persistent atrial fibrillation: Secondary | ICD-10-CM

## 2022-04-11 MED ORDER — RIVAROXABAN 15 MG PO TABS: 15.0000 mg | ORAL_TABLET | Freq: Every day | ORAL | 3 refills | Status: AC

## 2022-04-11 NOTE — Progress Notes (Signed)
Cardiology Office Note    Date:  04/11/2022   ID:  SWAY GUTTIERREZ, DOB 06-Dec-1945, MRN [id number]  PCP:  Caprice Renshaw, MD  Cardiologist: Carlyle Dolly, MD    Chief Complaint  Patient presents with   Hospitalization Follow-up    History of Present Illness:    Jordan [name] is a 76 y.o. female with past medical history of HTN, HLD, Stage III CKD, carotid artery stenosis (s/p R CEA), COPD, prior CVA and history of neuromuscular dysfunction of bladder and colon who presents to the office today for hospital follow-up  She was most recently admitted to [facility] last month for evaluation of worsening nausea, dyspnea and abdominal pain. She was found to have acute hypoxic respiratory failure in the setting of pulmonary edema and also to have a UTI. She was also in new onset atrial fibrillation and Cardiology was consulted for further management. A rate-control strategy was recommended at that time and IV Cardizem was transitioned to Cardizem CD 180 mg daily along with Lopressor 25 mg twice daily being initiated. During admission, she had worsening dysphagia and left arm weakness. CT Head showed multiple infarcts within the bilateral cerebellar hemispheres. Brain MRI did show an acute left superior cerebellar infarct and advanced chronic small vessel ischemia. Neurology was consulted and this was felt to be cardioembolic with recommendations to start Eliquis for anticoagulation. She was started on 2.5 mg twice daily given her variable creatinine and weight of 93 lbs.   In talking with the patient and one of her caregivers from [facility] today, she denies any recent palpitations or chest pain. She is mostly in her wheelchair throughout the day but says her breathing has been stable. No specific orthopnea, PND or pitting edema. She has been on Eliquis for anticoagulation and denies any evidence of active bleeding but says she does itch along her stomach and arms and questions if this is possibly  secondary to the medication as she is aware of other people that have had this reaction to Eliquis as well.  Past Medical History:  Diagnosis Date   Allergy    seasonal   Anemia    Arthritis    Asthma    uses Advair and Spiriva daily,Albuterol prn.Takes Singulair at bedtime and Prednisone daily   Back spasm    takes Zanaflex daily as needed   Blood transfusion 2007   no abnormal reation noted    Bruises easily    d/t being on Prednisone   Bursitis    left elbow   C. difficile colitis    2007: treated with Flagyl and Vanc, 2014: treated with vanc, Aug 2014: Vanc taper   Chronic back pain    Chronic kidney disease    "creatine" creeping up - followed at this time by PCP   Complication of anesthesia 2000   woke up during one surgery- ovary surgery   COPD (chronic obstructive pulmonary disease) ([place])    Depression    takes Prozac daily   Dysphasia    Falls    H/O hiatal hernia    Hemorrhoids    History of bronchitis    last time a yr ago   History of colon polyps    History of kidney stones    History of kidney stones    Hyperlipidemia    hx of-was on meds but has been off x 1 1/2 yrs   Hypertension    takes Verapamil nightly   IBS (irritable bowel syndrome)  Insomnia    takes Benadryl as needed   Myocardial infarction [facility]) 2007   Takotsubo cardiomyopathy   Neurogenic bowel    Neuromuscular dysfunction of bladder, unspecified    Nocturia    Osteoporosis    takes Fosamax weekly   Pneumonia    MRSA pneumonia in 2007   Shortness of breath    with exertion daily   Stroke [facility])    "they say I has a small stroke".No deficits   Tingling    and pain in left leg   Urinary frequency     Past Surgical History:  Procedure Laterality Date   APPENDECTOMY     BIOPSY  04/13/2017   Procedure: BIOPSY;  Surgeon: Daneil Dolin, MD;  [facility];  Service: Endoscopy;;  right and left colon    BIOPSY  07/06/2018   Procedure: BIOPSY;  Surgeon: Danie Binder, MD;  Location: [facility];  Service: Endoscopy;;  Random Colon   CARPAL TUNNEL RELEASE Left    CARPAL TUNNEL RELEASE Right 09/21/2015   Procedure: CARPAL TUNNEL RELEASE;  Surgeon: Carole Civil, MD;  Location: [facility];  Service: Orthopedics;  Laterality: Right;   cataract surgery Bilateral    CHOLECYSTECTOMY     COLONOSCOPY  09/24/2006   RMR: Normal rectum, left-sided diverticula   COLONOSCOPY  06/2011   Dr. Henrene Pastor: tubular adenomas, moderative diverticulosis, internal hemorrhoids   COLONOSCOPY WITH PROPOFOL N/A 04/13/2017   Dr. Gala Romney: internal hemorrhoids, diverticulosis   CYSTOSCOPY     ENDARTERECTOMY Right 12/12/2015   Procedure: ENDARTERECTOMY RIGHT CAROTID, RESECTION OF REDUNDANT RIGHT COMMON CAROTID ARTERY WITH PRIMARY REANASTOMOSIS;  Surgeon: Mal Misty, MD;  Location: [facility] OR;  Service: Vascular;  Laterality: Right;   ESOPHAGOGASTRODUODENOSCOPY  04/07/2006   ZSW:FUXNAT esophagus s/p placement of Bravo pH probe, some surgical changes but not typical of fundoplication wrap, may have slipped   FLEXIBLE SIGMOIDOSCOPY N/A 07/06/2018   Procedure: FLEXIBLE SIGMOIDOSCOPY WITH PROPOFOL;  Surgeon: Danie Binder, MD;  [facility];  Service: Endoscopy;  Laterality: N/A;  needs to be done first before the other inpatient    GANGLION CYST EXCISION Right 09/21/2015   Procedure: RIGHT WRIST GANGLION CYST REMOVAL;  Surgeon: Carole Civil, MD;  Location: [facility];  Service: Orthopedics;  Laterality: Right;   hemorrhoidal banding     HERNIA REPAIR     umbilical   KNEE ARTHROSCOPY Right    KNEE SURGERY Right    KYPHOPLASTY N/A 02/23/2018   Procedure: KYPHOPLASTY LUMBAR FOUR;  Surgeon: Newman Pies, MD;  Location: [place];  Service: Neurosurgery;  Laterality: N/A;   LUMBAR LAMINECTOMY/DECOMPRESSION MICRODISCECTOMY Left 10/17/2013   Procedure: LUMBAR LAMINECTOMY/DECOMPRESSION MICRODISCECTOMY 1 LEVEL three/four;  Surgeon: Ophelia Charter, MD;  Location: [place] [facility];   Service: Neurosurgery;  Laterality: Left;   NECK SURGERY     fusion-    NISSEN FUNDOPLICATION     x 2   OLECRANON BURSECTOMY Left 01/11/2014   Procedure: OLECRANON BURSECTOMY;  Surgeon: Carole Civil, MD;  Location: [facility];  Service: Orthopedics;  Laterality: Left;   OOPHORECTOMY Right    PATCH ANGIOPLASTY Right 12/12/2015   Procedure: PATCH ANGIOPLASTY RIGHT CAROTID ARTERY USING [place] PLATINUM FINESSE PATCH;  Surgeon: Mal Misty, MD;  Location: [facility] OR;  Service: Vascular;  Laterality: Right;   TONSILLECTOMY      Current Medications: Outpatient Medications Prior to Visit  Medication Sig Dispense Refill   B Complex-C-Folic Acid (B COMPLEX-VITAMIN C-FOLIC ACID) 1  MG tablet Take 1 tablet by mouth daily with breakfast.     diltiazem (CARDIZEM CD) 180 MG 24 hr capsule Take 1 capsule (180 mg total) by mouth daily. 30 capsule 1   Ergocalciferol (VITAMIN D2) 50 MCG (2000 UT) TABS Take 1 tablet by mouth daily.     FLUoxetine (PROZAC) 40 MG capsule Take 40 mg by mouth daily.     fluticasone furoate-vilanterol (BREO ELLIPTA) 100-25 MCG/INH AEPB Inhale 1 puff into the lungs daily.     gabapentin (NEURONTIN) 100 MG capsule Take 100 mg by mouth daily.     iron polysaccharides (NIFEREX) 150 MG capsule Take 1 capsule by mouth daily.     lidocaine (LIDODERM) 5 % Place 1 patch onto the skin.     megestrol (MEGACE) 400 MG/10ML suspension Take 10 mLs (400 mg total) by mouth daily. 240 mL 0   Melatonin 3 MG TABS Take 6 mg by mouth at bedtime.      metoprolol tartrate (LOPRESSOR) 25 MG tablet Take 1 tablet (25 mg total) by mouth 2 (two) times daily. 60 tablet 0   montelukast (SINGULAIR) 10 MG tablet Take 10 mg by mouth daily.     ondansetron (ZOFRAN) 4 MG tablet Take 1 tablet (4 mg total) by mouth every 6 (six) hours as needed for nausea. 20 tablet 0   oxyCODONE (OXY IR/ROXICODONE) 5 MG immediate release tablet Take 5 mg by mouth every 4 (four) hours as needed.     umeclidinium bromide (INCRUSE  ELLIPTA) 62.5 MCG/ACT AEPB Inhale 1 puff into the lungs daily.     apixaban (ELIQUIS) 2.5 MG TABS tablet Take 1 tablet (2.5 mg total) by mouth 2 (two) times daily. 60 tablet 1   No facility-administered medications prior to visit.     Allergies:   Cardura [doxazosin mesylate], Cardura [doxazosin], Strawberry extract, Barbiturates, Levofloxacin, and Sulfa antibiotics   Social History   Socioeconomic History   Marital status: Divorced    Spouse name: Not on file   Number of children: Not on file   Years of education: Not on file   Highest education level: Not on file  Occupational History   Not on file  Tobacco Use   Smoking status: Never   Smokeless tobacco: Never  Vaping Use   Vaping Use: Never used  Substance and Sexual Activity   Alcohol use: Not Currently    Alcohol/week: 10.0 standard drinks of alcohol    Types: 10 Cans of beer per week    Comment: couple of beers daily   Drug use: No   Sexual activity: Never    Birth control/protection: Post-menopausal  Other Topics Concern   Not on file  Social History Narrative   Not on file   Social Determinants of Health   Financial Resource Strain: Not on file  Food Insecurity: Not on file  Transportation Needs: Not on file  Physical Activity: Not on file  Stress: Not on file  Social Connections: Not on file     Family History:  The patient's family history includes Heart disease in her father.   Review of Systems:    Please see the history of present illness.     All other systems reviewed and are otherwise negative except as noted above.   Physical Exam:    VS:  BP 98/62   Pulse 62   Ht '5\' 2"'$  (1.575 m)   Wt 100 lb (45.4 kg)   SpO2 98%   BMI 18.29 kg/m  General: Frail, elderly female appearing in no acute distress. Sitting in wheelchair.  Head: Normocephalic, atraumatic. Neck: No carotid bruits. JVD not elevated.  Lungs: Respirations regular and unlabored, without wheezes or rales.  Heart: Irregularly  irregular. No S3 or S4.  No murmur, no rubs, or gallops appreciated. Abdomen: Appears non-distended. No obvious abdominal masses. Msk:  Strength and tone appear normal for age. No obvious joint deformities or effusions. Extremities: No clubbing or cyanosis. No edema.  Distal pedal pulses are 2+ bilaterally. Neuro: Alert and oriented X 3. Moves all extremities spontaneously. No focal deficits noted. Psych:  Responds to questions appropriately with a normal affect. Skin: No rashes or lesions noted  Wt Readings from Last 3 Encounters:  04/11/22 100 lb (45.4 kg)  03/23/22 93 lb 4.1 oz (42.3 kg)  12/31/21 104 lb (47.2 kg)     Studies/Labs Reviewed:   EKG:  EKG is ordered today.  The ekg ordered today demonstrates rate-controlled atrial fibrillation, HR 62. Incomplete RBBB and baseline artifact but no acute ST changes.   Recent Labs: 03/23/2022: ALT 20; B Natriuretic Peptide 580.0; TSH 1.804 03/26/2022: BUN 19; Creatinine, Ser 1.38; Hemoglobin 10.5; Magnesium 1.9; Platelets 257; Potassium 4.0; Sodium 141   Lipid Panel    Component Value Date/Time   CHOL 155 03/26/2022 0637   TRIG 90 03/26/2022 0637   HDL 58 03/26/2022 0637   CHOLHDL 2.7 03/26/2022 0637   VLDL 18 03/26/2022 0637   LDLCALC 79 03/26/2022 0637    Additional studies/ records that were reviewed today include:   Echocardiogram: 03/2022 IMPRESSIONS     1. Cannot completely fully assess wall motion with technical challenges.  Left ventricular ejection fraction, by estimation, is 60 to 65%. The left  ventricle has normal function. The left ventricle has no regional wall  motion abnormalities. Left  ventricular diastolic parameters are indeterminate.   2. Right ventricular systolic function is normal. The right ventricular  size is normal. There is normal pulmonary artery systolic pressure.   3. Left atrial size was mildly dilated.   4. Right atrial size was mildly dilated.   5. Posteriorly directed jet. The mitral valve  was not well visualized.  Mild mitral valve regurgitation.   6. The aortic valve is normal in structure. There is mild calcification  of the aortic valve. Aortic valve regurgitation is not visualized.   7. The inferior vena cava is normal in size with greater than 50%  respiratory variability, suggesting right atrial pressure of 3 mmHg.   8. Cannot exclude a small PFO.   Assessment:    1. Persistent atrial fibrillation ([place])   2. Essential hypertension   3. Stage 3b chronic kidney disease ([place])      Plan:   In order of problems listed above:  1. Persistent Atrial Fibrillation - While this was newly documented during her recent admission, she reports a history of atrial fibrillation when she was previously followed by Dr. Luan Pulling. A rate-control strategy was pursued during her recent admission and seems reasonable at this time given that she is overall asymptomatic with the arrhythmia.  Her heart rate is well controlled in the 60's today and will continue Cardizem CD 180 mg daily along with Lopressor 25 mg twice daily for rate control. Her systolic BP is in the 28'U today but she denies any associated symptoms with this. - She is concerned that Eliquis is causing her episodes of itching we reviewed we could try switching to Xarelto to see if this helps with  her symptoms. Will stop Eliquis and switch to Xarelto 15 mg daily which is the appropriate dose given her calculated creatinine clearance.  2. HTN - BP is actually soft at 98/62 during today's visit but she reports it has been higher than this when checked at SNF and she denies any associated symptoms. Will continue current medical therapy with Cardizem CD 180 mg daily and Lopressor 25 mg twice daily.  3. CKD Stage III - Her creatinine was variable between 1.2 - 1.6 during her recent admission, improved to 1.38 at the time of hospital discharge on 03/26/2022.    Medication Adjustments/Labs and Tests Ordered: Current medicines are  reviewed at length with the patient today.  Concerns regarding medicines are outlined above.  Medication changes, Labs and Tests ordered today are listed in the Patient Instructions below. Patient Instructions  Medication Instructions:  Your physician has recommended you make the following change in your medication:  Stop Eliquis  Start Xarelto 15 mg tablets once daily   Labwork: None  Testing/Procedures: None  Follow-Up: Follow up in 3-4 months with Dr. Harl Bowie.   Any Other Special Instructions Will Be Listed Below (If Applicable).     If you need a refill on your cardiac medications before your next appointment, please call your pharmacy.    Signed, Erma Heritage, PA-C  04/11/2022 5:14 PM    [facility] [address] [place], [place] [address] Phone: [phone] Fax: [phone]

## 2022-04-11 NOTE — Patient Instructions (Signed)
Medication Instructions:  Your physician has recommended you make the following change in your medication:  Stop Eliquis  Start Xarelto 15 mg tablets once daily   Labwork: None  Testing/Procedures: None  Follow-Up: Follow up in 3-4 months with Dr. Harl Bowie.   Any Other Special Instructions Will Be Listed Below (If Applicable).     If you need a refill on your cardiac medications before your next appointment, please call your pharmacy.

## 2022-04-29 ENCOUNTER — Ambulatory Visit: Payer: Medicare Other | Admitting: Gastroenterology

## 2022-06-19 ENCOUNTER — Inpatient Hospital Stay (HOSPITAL_COMMUNITY): Payer: Medicare Other

## 2022-06-19 ENCOUNTER — Emergency Department (HOSPITAL_COMMUNITY): Payer: Medicare Other

## 2022-06-19 ENCOUNTER — Inpatient Hospital Stay (HOSPITAL_COMMUNITY)
Admission: EM | Admit: 2022-06-19 | Discharge: 2022-07-09 | DRG: 871 | Disposition: E | Payer: Medicare Other | Source: Skilled Nursing Facility | Attending: Family Medicine | Admitting: Family Medicine

## 2022-06-19 ENCOUNTER — Encounter (HOSPITAL_COMMUNITY): Payer: Self-pay | Admitting: Emergency Medicine

## 2022-06-19 DIAGNOSIS — Z7951 Long term (current) use of inhaled steroids: Secondary | ICD-10-CM

## 2022-06-19 DIAGNOSIS — R64 Cachexia: Secondary | ICD-10-CM | POA: Diagnosis present

## 2022-06-19 DIAGNOSIS — Z8673 Personal history of transient ischemic attack (TIA), and cerebral infarction without residual deficits: Secondary | ICD-10-CM

## 2022-06-19 DIAGNOSIS — R1310 Dysphagia, unspecified: Secondary | ICD-10-CM | POA: Diagnosis present

## 2022-06-19 DIAGNOSIS — M810 Age-related osteoporosis without current pathological fracture: Secondary | ICD-10-CM | POA: Diagnosis present

## 2022-06-19 DIAGNOSIS — M4856XA Collapsed vertebra, not elsewhere classified, lumbar region, initial encounter for fracture: Secondary | ICD-10-CM | POA: Diagnosis present

## 2022-06-19 DIAGNOSIS — J449 Chronic obstructive pulmonary disease, unspecified: Secondary | ICD-10-CM | POA: Diagnosis present

## 2022-06-19 DIAGNOSIS — Z9981 Dependence on supplemental oxygen: Secondary | ICD-10-CM | POA: Diagnosis not present

## 2022-06-19 DIAGNOSIS — Z66 Do not resuscitate: Secondary | ICD-10-CM | POA: Diagnosis present

## 2022-06-19 DIAGNOSIS — R627 Adult failure to thrive: Secondary | ICD-10-CM | POA: Diagnosis not present

## 2022-06-19 DIAGNOSIS — A419 Sepsis, unspecified organism: Secondary | ICD-10-CM | POA: Diagnosis present

## 2022-06-19 DIAGNOSIS — R6521 Severe sepsis with septic shock: Secondary | ICD-10-CM | POA: Diagnosis present

## 2022-06-19 DIAGNOSIS — Z1152 Encounter for screening for COVID-19: Secondary | ICD-10-CM | POA: Diagnosis not present

## 2022-06-19 DIAGNOSIS — L89616 Pressure-induced deep tissue damage of right heel: Secondary | ICD-10-CM | POA: Diagnosis present

## 2022-06-19 DIAGNOSIS — I482 Chronic atrial fibrillation, unspecified: Secondary | ICD-10-CM | POA: Diagnosis present

## 2022-06-19 DIAGNOSIS — A4151 Sepsis due to Escherichia coli [E. coli]: Principal | ICD-10-CM | POA: Diagnosis present

## 2022-06-19 DIAGNOSIS — J440 Chronic obstructive pulmonary disease with acute lower respiratory infection: Secondary | ICD-10-CM | POA: Diagnosis present

## 2022-06-19 DIAGNOSIS — Z91018 Allergy to other foods: Secondary | ICD-10-CM

## 2022-06-19 DIAGNOSIS — B9620 Unspecified Escherichia coli [E. coli] as the cause of diseases classified elsewhere: Secondary | ICD-10-CM | POA: Diagnosis present

## 2022-06-19 DIAGNOSIS — N179 Acute kidney failure, unspecified: Secondary | ICD-10-CM | POA: Diagnosis present

## 2022-06-19 DIAGNOSIS — I5181 Takotsubo syndrome: Secondary | ICD-10-CM | POA: Diagnosis present

## 2022-06-19 DIAGNOSIS — E785 Hyperlipidemia, unspecified: Secondary | ICD-10-CM | POA: Diagnosis present

## 2022-06-19 DIAGNOSIS — E43 Unspecified severe protein-calorie malnutrition: Secondary | ICD-10-CM | POA: Diagnosis present

## 2022-06-19 DIAGNOSIS — J189 Pneumonia, unspecified organism: Secondary | ICD-10-CM | POA: Diagnosis present

## 2022-06-19 DIAGNOSIS — D649 Anemia, unspecified: Secondary | ICD-10-CM | POA: Diagnosis not present

## 2022-06-19 DIAGNOSIS — I129 Hypertensive chronic kidney disease with stage 1 through stage 4 chronic kidney disease, or unspecified chronic kidney disease: Secondary | ICD-10-CM | POA: Diagnosis present

## 2022-06-19 DIAGNOSIS — R7989 Other specified abnormal findings of blood chemistry: Secondary | ICD-10-CM | POA: Diagnosis present

## 2022-06-19 DIAGNOSIS — Z888 Allergy status to other drugs, medicaments and biological substances status: Secondary | ICD-10-CM

## 2022-06-19 DIAGNOSIS — Z9102 Food additives allergy status: Secondary | ICD-10-CM

## 2022-06-19 DIAGNOSIS — L89116 Pressure-induced deep tissue damage of right upper back: Secondary | ICD-10-CM | POA: Diagnosis present

## 2022-06-19 DIAGNOSIS — R7881 Bacteremia: Secondary | ICD-10-CM | POA: Diagnosis not present

## 2022-06-19 DIAGNOSIS — R6520 Severe sepsis without septic shock: Secondary | ICD-10-CM | POA: Diagnosis present

## 2022-06-19 DIAGNOSIS — Z981 Arthrodesis status: Secondary | ICD-10-CM

## 2022-06-19 DIAGNOSIS — L89626 Pressure-induced deep tissue damage of left heel: Secondary | ICD-10-CM | POA: Diagnosis present

## 2022-06-19 DIAGNOSIS — I252 Old myocardial infarction: Secondary | ICD-10-CM

## 2022-06-19 DIAGNOSIS — F32A Depression, unspecified: Secondary | ICD-10-CM | POA: Diagnosis present

## 2022-06-19 DIAGNOSIS — N1832 Chronic kidney disease, stage 3b: Secondary | ICD-10-CM | POA: Diagnosis present

## 2022-06-19 DIAGNOSIS — Z515 Encounter for palliative care: Secondary | ICD-10-CM

## 2022-06-19 DIAGNOSIS — Z881 Allergy status to other antibiotic agents status: Secondary | ICD-10-CM

## 2022-06-19 DIAGNOSIS — J9610 Chronic respiratory failure, unspecified whether with hypoxia or hypercapnia: Secondary | ICD-10-CM | POA: Diagnosis present

## 2022-06-19 DIAGNOSIS — Z8249 Family history of ischemic heart disease and other diseases of the circulatory system: Secondary | ICD-10-CM

## 2022-06-19 DIAGNOSIS — I6529 Occlusion and stenosis of unspecified carotid artery: Secondary | ICD-10-CM | POA: Diagnosis present

## 2022-06-19 DIAGNOSIS — N319 Neuromuscular dysfunction of bladder, unspecified: Secondary | ICD-10-CM | POA: Diagnosis present

## 2022-06-19 DIAGNOSIS — Z681 Body mass index (BMI) 19 or less, adult: Secondary | ICD-10-CM | POA: Diagnosis not present

## 2022-06-19 DIAGNOSIS — S32000A Wedge compression fracture of unspecified lumbar vertebra, initial encounter for closed fracture: Secondary | ICD-10-CM | POA: Diagnosis present

## 2022-06-19 DIAGNOSIS — R54 Age-related physical debility: Secondary | ICD-10-CM | POA: Diagnosis present

## 2022-06-19 DIAGNOSIS — K589 Irritable bowel syndrome without diarrhea: Secondary | ICD-10-CM | POA: Diagnosis present

## 2022-06-19 DIAGNOSIS — Z7901 Long term (current) use of anticoagulants: Secondary | ICD-10-CM

## 2022-06-19 DIAGNOSIS — D631 Anemia in chronic kidney disease: Secondary | ICD-10-CM | POA: Diagnosis present

## 2022-06-19 DIAGNOSIS — L89316 Pressure-induced deep tissue damage of right buttock: Secondary | ICD-10-CM | POA: Diagnosis present

## 2022-06-19 DIAGNOSIS — J9611 Chronic respiratory failure with hypoxia: Secondary | ICD-10-CM | POA: Diagnosis present

## 2022-06-19 DIAGNOSIS — E860 Dehydration: Secondary | ICD-10-CM | POA: Diagnosis present

## 2022-06-19 DIAGNOSIS — Z882 Allergy status to sulfonamides status: Secondary | ICD-10-CM

## 2022-06-19 DIAGNOSIS — I4891 Unspecified atrial fibrillation: Secondary | ICD-10-CM | POA: Diagnosis present

## 2022-06-19 DIAGNOSIS — I10 Essential (primary) hypertension: Secondary | ICD-10-CM | POA: Diagnosis present

## 2022-06-19 DIAGNOSIS — Z79899 Other long term (current) drug therapy: Secondary | ICD-10-CM

## 2022-06-19 DIAGNOSIS — R509 Fever, unspecified: Secondary | ICD-10-CM | POA: Diagnosis present

## 2022-06-19 DIAGNOSIS — R29818 Other symptoms and signs involving the nervous system: Secondary | ICD-10-CM | POA: Diagnosis present

## 2022-06-19 LAB — PROTIME-INR
INR: 1.3 — ABNORMAL HIGH (ref 0.8–1.2)
Prothrombin Time: 15.7 s — ABNORMAL HIGH (ref 11.4–15.2)

## 2022-06-19 LAB — COMPREHENSIVE METABOLIC PANEL
ALT: 26 U/L (ref 0–44)
AST: 24 U/L (ref 15–41)
Albumin: 2.8 g/dL — ABNORMAL LOW (ref 3.5–5.0)
Alkaline Phosphatase: 59 U/L (ref 38–126)
Anion gap: 8 (ref 5–15)
BUN: 48 mg/dL — ABNORMAL HIGH (ref 8–23)
CO2: 14 mmol/L — ABNORMAL LOW (ref 22–32)
Calcium: 8.3 mg/dL — ABNORMAL LOW (ref 8.9–10.3)
Chloride: 115 mmol/L — ABNORMAL HIGH (ref 98–111)
Creatinine, Ser: 2.66 mg/dL — ABNORMAL HIGH (ref 0.44–1.00)
GFR, Estimated: 18 mL/min — ABNORMAL LOW (ref >60)
Glucose, Bld: 96 mg/dL (ref 70–99)
Potassium: 5 mmol/L (ref 3.5–5.1)
Sodium: 137 mmol/L (ref 135–145)
Total Bilirubin: 0.6 mg/dL (ref 0.3–1.2)
Total Protein: 6.6 g/dL (ref 6.5–8.1)

## 2022-06-19 LAB — CBC WITH DIFFERENTIAL/PLATELET
Abs Immature Granulocytes: 0 10*3/uL (ref 0.00–0.07)
Band Neutrophils: 8 %
Basophils Absolute: 0 10*3/uL (ref 0.0–0.1)
Basophils Relative: 0 %
Eosinophils Absolute: 0.5 10*3/uL (ref 0.0–0.5)
Eosinophils Relative: 5 %
HCT: 27.1 % — ABNORMAL LOW (ref 36.0–46.0)
Hemoglobin: 8.7 g/dL — ABNORMAL LOW (ref 12.0–15.0)
Lymphocytes Relative: 5 %
Lymphs Abs: 0.5 10*3/uL — ABNORMAL LOW (ref 0.7–4.0)
MCH: 30.9 pg (ref 26.0–34.0)
MCHC: 32.1 g/dL (ref 30.0–36.0)
MCV: 96.1 fL (ref 80.0–100.0)
Monocytes Absolute: 0.2 10*3/uL (ref 0.1–1.0)
Monocytes Relative: 2 %
Neutro Abs: 9.2 10*3/uL — ABNORMAL HIGH (ref 1.7–7.7)
Neutrophils Relative %: 80 %
Platelets: 304 10*3/uL (ref 150–400)
RBC: 2.82 MIL/uL — ABNORMAL LOW (ref 3.87–5.11)
RDW: 14.8 % (ref 11.5–15.5)
WBC: 10.5 10*3/uL (ref 4.0–10.5)
nRBC: 0 % (ref 0.0–0.2)

## 2022-06-19 LAB — LACTIC ACID, PLASMA: Lactic Acid, Venous: 1.7 mmol/L (ref 0.5–1.9)

## 2022-06-19 LAB — RESP PANEL BY RT-PCR (FLU A&B, COVID) ARPGX2
Influenza A by PCR: NEGATIVE
Influenza B by PCR: NEGATIVE
SARS Coronavirus 2 by RT PCR: NEGATIVE

## 2022-06-19 LAB — GLUCOSE, CAPILLARY: Glucose-Capillary: 113 mg/dL — ABNORMAL HIGH (ref 70–99)

## 2022-06-19 LAB — BRAIN NATRIURETIC PEPTIDE: B Natriuretic Peptide: 612 pg/mL — ABNORMAL HIGH (ref 0.0–100.0)

## 2022-06-19 LAB — APTT: aPTT: 28 s (ref 24–36)

## 2022-06-19 LAB — TROPONIN I (HIGH SENSITIVITY): Troponin I (High Sensitivity): 9 ng/L (ref <18)

## 2022-06-19 MED ORDER — SODIUM CHLORIDE 0.9 % IV SOLN
2.0000 g | Freq: Once | INTRAVENOUS | Status: AC
  Administered 2022-06-19: 2 g via INTRAVENOUS
  Filled 2022-06-19: qty 20

## 2022-06-19 MED ORDER — MONTELUKAST SODIUM 10 MG PO TABS: 10.0000 mg | ORAL_TABLET | Freq: Every day | ORAL | Status: DC

## 2022-06-19 MED ORDER — SODIUM CHLORIDE 0.9 % IV BOLUS
250.0000 mL | Freq: Once | INTRAVENOUS | Status: AC
  Administered 2022-06-19: 250 mL via INTRAVENOUS

## 2022-06-19 MED ORDER — METHYLPREDNISOLONE SODIUM SUCC 125 MG IJ SOLR
60.0000 mg | Freq: Once | INTRAMUSCULAR | Status: AC
  Administered 2022-06-19: 60 mg via INTRAVENOUS
  Filled 2022-06-19: qty 2

## 2022-06-19 MED ORDER — ENOXAPARIN SODIUM 60 MG/0.6ML IJ SOSY
45.0000 mg | PREFILLED_SYRINGE | INTRAMUSCULAR | Status: DC
  Administered 2022-06-19: 45 mg via SUBCUTANEOUS
  Filled 2022-06-19: qty 0.6

## 2022-06-19 MED ORDER — LACTATED RINGERS IV BOLUS
500.0000 mL | Freq: Once | INTRAVENOUS | Status: AC
  Administered 2022-06-19: 500 mL via INTRAVENOUS

## 2022-06-19 MED ORDER — SODIUM CHLORIDE 0.9 % IV BOLUS
1000.0000 mL | Freq: Once | INTRAVENOUS | Status: AC
  Administered 2022-06-19: 1000 mL via INTRAVENOUS

## 2022-06-19 MED ORDER — MELATONIN 3 MG PO TABS: 6.0000 mg | ORAL_TABLET | Freq: Every day | ORAL | Status: DC

## 2022-06-19 MED ORDER — SODIUM CHLORIDE 0.9 % IV SOLN
2.0000 g | INTRAVENOUS | Status: DC
  Administered 2022-06-20: 2 g via INTRAVENOUS
  Filled 2022-06-19: qty 20

## 2022-06-19 MED ORDER — DILTIAZEM HCL ER COATED BEADS 180 MG PO CP24
180.0000 mg | ORAL_CAPSULE | Freq: Every day | ORAL | Status: DC
  Filled 2022-06-19: qty 1

## 2022-06-19 MED ORDER — ONDANSETRON HCL 4 MG/2ML IJ SOLN
4.0000 mg | Freq: Four times a day (QID) | INTRAMUSCULAR | Status: DC | PRN
  Administered 2022-06-19: 4 mg via INTRAVENOUS
  Filled 2022-06-19: qty 2

## 2022-06-19 MED ORDER — FLUOXETINE HCL 20 MG PO CAPS
40.0000 mg | ORAL_CAPSULE | Freq: Every day | ORAL | Status: DC
  Administered 2022-06-19: 40 mg via ORAL
  Filled 2022-06-19: qty 2

## 2022-06-19 MED ORDER — SODIUM CHLORIDE 0.9% FLUSH: 3.0000 mL | INTRAVENOUS | Status: DC | PRN

## 2022-06-19 MED ORDER — BISACODYL 5 MG PO TBEC: 5.0000 mg | DELAYED_RELEASE_TABLET | Freq: Every day | ORAL | Status: DC | PRN

## 2022-06-19 MED ORDER — FLUTICASONE FUROATE-VILANTEROL 100-25 MCG/ACT IN AEPB
1.0000 | INHALATION_SPRAY | Freq: Every day | RESPIRATORY_TRACT | Status: DC
  Administered 2022-06-19 – 2022-06-20 (×2): 1 via RESPIRATORY_TRACT
  Filled 2022-06-19: qty 28

## 2022-06-19 MED ORDER — SODIUM CHLORIDE 0.9 % IV SOLN: 250.0000 mL | INTRAVENOUS | Status: DC | PRN

## 2022-06-19 MED ORDER — CHLORHEXIDINE GLUCONATE CLOTH 2 % EX PADS: 6.0000 | MEDICATED_PAD | Freq: Every day | CUTANEOUS | Status: DC

## 2022-06-19 MED ORDER — ACETAMINOPHEN 325 MG PO TABS: 650.0000 mg | ORAL_TABLET | Freq: Four times a day (QID) | ORAL | Status: DC | PRN

## 2022-06-19 MED ORDER — MEGESTROL ACETATE 400 MG/10ML PO SUSP
400.0000 mg | Freq: Every day | ORAL | Status: DC
  Administered 2022-06-19: 400 mg via ORAL
  Filled 2022-06-19: qty 10

## 2022-06-19 MED ORDER — ACETAMINOPHEN 325 MG PO TABS
650.0000 mg | ORAL_TABLET | Freq: Once | ORAL | Status: AC
  Administered 2022-06-19: 650 mg via ORAL
  Filled 2022-06-19: qty 2

## 2022-06-19 MED ORDER — GABAPENTIN 100 MG PO CAPS
100.0000 mg | ORAL_CAPSULE | Freq: Every day | ORAL | Status: DC
  Administered 2022-06-19: 100 mg via ORAL
  Filled 2022-06-19: qty 1

## 2022-06-19 MED ORDER — OXYCODONE HCL 5 MG PO TABS: 5.0000 mg | ORAL_TABLET | Freq: Four times a day (QID) | ORAL | Status: DC | PRN

## 2022-06-19 MED ORDER — SODIUM CHLORIDE 0.9 % IV SOLN
500.0000 mg | INTRAVENOUS | Status: DC
  Administered 2022-06-19 – 2022-06-20 (×2): 500 mg via INTRAVENOUS
  Filled 2022-06-19 (×2): qty 5

## 2022-06-19 MED ORDER — IPRATROPIUM-ALBUTEROL 0.5-2.5 (3) MG/3ML IN SOLN
3.0000 mL | Freq: Four times a day (QID) | RESPIRATORY_TRACT | Status: DC | PRN
  Administered 2022-06-19: 3 mL via RESPIRATORY_TRACT
  Filled 2022-06-19: qty 3

## 2022-06-19 MED ORDER — ONDANSETRON HCL 4 MG PO TABS: 4.0000 mg | ORAL_TABLET | Freq: Four times a day (QID) | ORAL | Status: DC | PRN

## 2022-06-19 MED ORDER — FUROSEMIDE 10 MG/ML IJ SOLN
20.0000 mg | Freq: Once | INTRAMUSCULAR | Status: AC
  Administered 2022-06-19: 20 mg via INTRAVENOUS
  Filled 2022-06-19: qty 2

## 2022-06-19 MED ORDER — LORAZEPAM 2 MG/ML IJ SOLN
0.2500 mg | Freq: Once | INTRAMUSCULAR | Status: AC
  Administered 2022-06-19: 0.25 mg via INTRAVENOUS
  Filled 2022-06-19: qty 1

## 2022-06-19 MED ORDER — LIDOCAINE 5 % EX PTCH
1.0000 | MEDICATED_PATCH | CUTANEOUS | Status: DC
  Filled 2022-06-19 (×5): qty 1

## 2022-06-19 MED ORDER — SODIUM CHLORIDE 0.9 % IV BOLUS
500.0000 mL | Freq: Once | INTRAVENOUS | Status: AC
  Administered 2022-06-19: 500 mL via INTRAVENOUS

## 2022-06-19 MED ORDER — POLYSACCHARIDE IRON COMPLEX 150 MG PO CAPS
150.0000 mg | ORAL_CAPSULE | Freq: Every day | ORAL | Status: DC
  Administered 2022-06-19: 150 mg via ORAL
  Filled 2022-06-19: qty 1

## 2022-06-19 MED ORDER — LEVALBUTEROL HCL 0.63 MG/3ML IN NEBU: 0.6300 mg | INHALATION_SOLUTION | Freq: Four times a day (QID) | RESPIRATORY_TRACT | Status: DC | PRN

## 2022-06-19 MED ORDER — METOPROLOL TARTRATE 5 MG/5ML IV SOLN
2.5000 mg | Freq: Once | INTRAVENOUS | Status: AC
  Administered 2022-06-19: 2.5 mg via INTRAVENOUS
  Filled 2022-06-19: qty 5

## 2022-06-19 MED ORDER — METOPROLOL TARTRATE 25 MG PO TABS
25.0000 mg | ORAL_TABLET | Freq: Two times a day (BID) | ORAL | Status: DC
  Filled 2022-06-19: qty 1

## 2022-06-19 MED ORDER — SODIUM CHLORIDE 0.9% FLUSH
3.0000 mL | Freq: Two times a day (BID) | INTRAVENOUS | Status: DC
  Administered 2022-06-19 (×2): 3 mL via INTRAVENOUS

## 2022-06-19 MED ORDER — SODIUM CHLORIDE 0.9 % IV SOLN: INTRAVENOUS | Status: DC

## 2022-06-19 MED ORDER — GUAIFENESIN ER 600 MG PO TB12
600.0000 mg | ORAL_TABLET | Freq: Two times a day (BID) | ORAL | Status: DC
  Administered 2022-06-19: 600 mg via ORAL
  Filled 2022-06-19: qty 1

## 2022-06-19 MED ORDER — IPRATROPIUM-ALBUTEROL 0.5-2.5 (3) MG/3ML IN SOLN
3.0000 mL | Freq: Once | RESPIRATORY_TRACT | Status: AC
  Administered 2022-06-19: 3 mL via RESPIRATORY_TRACT
  Filled 2022-06-19: qty 3

## 2022-06-19 MED ORDER — UMECLIDINIUM BROMIDE 62.5 MCG/ACT IN AEPB
1.0000 | INHALATION_SPRAY | Freq: Every day | RESPIRATORY_TRACT | Status: DC
  Administered 2022-06-19 – 2022-06-20 (×2): 1 via RESPIRATORY_TRACT
  Filled 2022-06-19: qty 7

## 2022-06-19 MED ORDER — ACETAMINOPHEN 650 MG RE SUPP
650.0000 mg | Freq: Four times a day (QID) | RECTAL | Status: DC | PRN
  Filled 2022-06-19: qty 1

## 2022-06-19 MED ORDER — MIDODRINE HCL 5 MG PO TABS
10.0000 mg | ORAL_TABLET | Freq: Three times a day (TID) | ORAL | Status: DC
  Administered 2022-06-20: 10 mg via ORAL
  Filled 2022-06-19: qty 2

## 2022-06-19 NOTE — ED Provider Notes (Signed)
[name] Provider Note   CSN: [id number] Arrival date & time: 06/08/2022  0457     History  Chief Complaint  Patient presents with   Shortness of Breath    [name] is a 76 y.o. female.  76 yo F here with dyspnea. History from EMS and patient along with chart review.  Had been in the hospital back in July which she was diagnosed with A-fib and had multiple embolic strokes along with UTI.  She is discharged to a rehab facility.  She has been there on 3 L of oxygen since that time.  It sounds like tonight sometime in [place] she started getting real dyspneic.  The facility gave her breathing treatment but noted she was more hypoxic than normal so increased her oxygen and called EMS.  On the arrival she was hypoxic on her baseline but improved at 4 L.  She was tachypneic and tachycardic with an irregular rhythm so brought her here for further evaluation.  Patient states she had a cough and not feeling well with hypoxia since earlier in the night.  States that she was fine when she went to bed.  She feels better now that her oxygen is increased.  No other significant new symptoms.   Shortness of Breath      Home Medications Prior to Admission medications   Medication Sig Start Date End Date Taking? Authorizing Provider  B Complex-C-Folic Acid (B COMPLEX-VITAMIN C-FOLIC ACID) 1 MG tablet Take 1 tablet by mouth daily with breakfast.    [provider]  diltiazem (CARDIZEM CD) 180 MG 24 hr capsule Take 1 capsule (180 mg total) by mouth daily. 03/27/22 05/26/22  [facility], MD  Ergocalciferol (VITAMIN D2) 50 MCG (2000 UT) TABS Take 1 tablet by mouth daily.    [provider]  FLUoxetine (PROZAC) 40 MG capsule Take 40 mg by mouth daily.    [provider]  fluticasone furoate-vilanterol (BREO ELLIPTA) 100-25 MCG/INH AEPB Inhale 1 puff into the lungs daily. 11/11/19   [provider]  gabapentin (NEURONTIN) 100 MG capsule  Take 100 mg by mouth daily. 03/16/20   [provider]  iron polysaccharides (NIFEREX) 150 MG capsule Take 1 capsule by mouth daily.    [provider]  lidocaine (LIDODERM) 5 % Place 1 patch onto the skin.    [provider]  megestrol (MEGACE) 400 MG/10ML suspension Take 10 mLs (400 mg total) by mouth daily. 03/26/22   Shahmehdi, [facility], MD  Melatonin 3 MG TABS Take 6 mg by mouth at bedtime.     [provider]  metoprolol tartrate (LOPRESSOR) 25 MG tablet Take 1 tablet (25 mg total) by mouth 2 (two) times daily. 07/19/18   Jordan Deer, MD  montelukast (SINGULAIR) 10 MG tablet Take 10 mg by mouth daily.    [provider]  ondansetron (ZOFRAN) 4 MG tablet Take 1 tablet (4 mg total) by mouth every 6 (six) hours as needed for nausea. 03/26/22   Shahmehdi, [facility], MD  oxyCODONE (OXY IR/ROXICODONE) 5 MG immediate release tablet Take 5 mg by mouth every 4 (four) hours as needed.    [provider]  Rivaroxaban (XARELTO) 15 MG TABS tablet Take 1 tablet (15 mg total) by mouth daily with supper. 04/11/22   [name], Jordan Hertz, PA-C  umeclidinium bromide (INCRUSE ELLIPTA) 62.5 MCG/ACT AEPB Inhale 1 puff into the lungs daily.    [provider]      Allergies  Cardura [doxazosin mesylate], Cardura [doxazosin], Strawberry extract, Barbiturates, Levofloxacin, and Sulfa antibiotics    Review of Systems   Review of Systems  Respiratory:  Positive for shortness of breath.     Physical Exam Updated Vital Signs BP 102/69   Pulse (!) 142   Temp (!) 102.2 F (39 C) (Rectal)   Resp (!) 28   Ht '5\' 2"'$  (1.575 m)   Wt 45.4 kg   SpO2 99%   BMI 18.31 kg/m  Physical Exam Vitals and nursing note reviewed.  Constitutional:      Appearance: She is well-developed.  HENT:     Head: Normocephalic and atraumatic.  Cardiovascular:     Rate and Rhythm: Tachycardia present. Rhythm irregular.  Pulmonary:     Effort: Tachypnea present. No  respiratory distress.     Breath sounds: No stridor. Rales present. No decreased breath sounds.  Abdominal:     General: There is no distension.  Musculoskeletal:     Cervical back: Normal range of motion.     Right lower leg: No edema.     Left lower leg: No edema.  Skin:    Comments: Hot to touch  Neurological:     Mental Status: She is alert.     ED Results / Procedures / Treatments   Labs (all labs ordered are listed, but only abnormal results are displayed) Labs Reviewed  CBC WITH DIFFERENTIAL/PLATELET - Abnormal; Notable for the following components:      Result Value   RBC 2.82 (*)    Hemoglobin 8.7 (*)    HCT 27.1 (*)    Neutro Abs 9.2 (*)    Lymphs Abs 0.5 (*)    All other components within normal limits  COMPREHENSIVE METABOLIC PANEL - Abnormal; Notable for the following components:   Chloride 115 (*)    CO2 14 (*)    BUN 48 (*)    Creatinine, Ser 2.66 (*)    Calcium 8.3 (*)    Albumin 2.8 (*)    GFR, Estimated 18 (*)    All other components within normal limits  PROTIME-INR - Abnormal; Notable for the following components:   Prothrombin Time 15.7 (*)    INR 1.3 (*)    All other components within normal limits  CULTURE, BLOOD (ROUTINE X 2)  CULTURE, BLOOD (ROUTINE X 2)  URINE CULTURE  LACTIC ACID, PLASMA  APTT  BRAIN NATRIURETIC PEPTIDE  LACTIC ACID, PLASMA  URINALYSIS, ROUTINE W REFLEX MICROSCOPIC  TROPONIN I (HIGH SENSITIVITY)  TROPONIN I (HIGH SENSITIVITY)    EKG EKG Interpretation  Date/Time:  Thursday June 19 2022 05:17:02 EDT Ventricular Rate:  111 PR Interval:    QRS Duration: 118 QT Interval:  355 QTC Calculation: 483 R Axis:   104 Text Interpretation: Atrial fibrillation Incomplete right bundle branch block Lateral infarct, old Anteroseptal infarct, age indeterminate Confirmed by [name] [phone]) on 07/05/2022 6:22:17 AM  Radiology CT Chest Wo Contrast  Result Date: 07/05/2022 CLINICAL DATA:  Shortness of breath.  Abnormal  chest radiograph. EXAM: CT CHEST WITHOUT CONTRAST TECHNIQUE: Multidetector CT imaging of the chest was performed following the standard protocol without IV contrast. RADIATION DOSE REDUCTION: This exam was performed according to the departmental dose-optimization program which includes automated exposure control, adjustment of the mA and/or kV according to patient size and/or use of iterative reconstruction technique. COMPARISON:  09/07/2021 CT.  Today's chest radiograph. FINDINGS: Cardiovascular: Aortic atherosclerosis. Tortuous thoracic aorta. Mild cardiomegaly, without pericardial effusion. Multivessel coronary artery atherosclerosis. Pulmonary artery  enlargement, outflow tract 3.2 cm. Mediastinum/Nodes: New precarinal adenopathy at 1.3 cm on 55/2. Prevascular adenopathy is mild and new at 7 mm. Hilar regions poorly evaluated without intravenous contrast. Tiny hiatal hernia. Lungs/Pleura: No pleural fluid. Right hemidiaphragm elevation. Bilateral, lower lung predominant areas of nodular consolidation. Example 2.3 cm on 80/4 in the right lower lobe. A 1.5 cm area of presumed nodular consolidation in the left lower lobe on 98/4 has surrounding ground-glass. Upper Abdomen: Cholecystectomy. Normal imaged portions of the spleen, left adrenal gland, left kidney. Progressive staghorn calculus within the right kidney with overlying moderate cortical thinning. Mild right adrenal thickening. Musculoskeletal: Advanced degenerative changes of the left shoulder. T10, T12, and L2 moderate compression deformities. The L2 compression deformity is new since 09/07/2021. IMPRESSION: 1. Multifocal, lower lung predominant areas of nodular consolidation which are most likely related to infection. 2. New thoracic adenopathy, favored to be reactive. 3. Recommend appropriate antibiotic therapy and CT follow-up at 4-6 weeks to confirm resolution of each of these findings. 4. Progressive right-sided staghorn calculus with overlying  cortical thinning. 5.  Tiny hiatal hernia. 6. Coronary artery atherosclerosis. Aortic Atherosclerosis (ICD10-I70.0). 7. Pulmonary artery enlargement suggests pulmonary arterial hypertension. 8. Thoracolumbar compression deformities, including a new L2 compression deformity since 09/07/2021. Electronically Signed   By: Abigail Miyamoto M.D.   On: 06/12/2022 06:25   DG Chest Portable 1 View  Result Date: 06/18/2022 CLINICAL DATA:  Shortness of breath. EXAM: PORTABLE CHEST 1 VIEW COMPARISON:  03/24/2022 FINDINGS: Cardiopericardial silhouette is at upper limits of normal for size. Interstitial markings are diffusely coarsened with chronic features. New nodular opacity is identified in the infrahilar left lower lung. No overt airspace pulmonary edema or substantial pleural effusion. No focal airspace consolidation. Bones are diffusely demineralized. IMPRESSION: 1. New nodular opacity in the infrahilar left lower lung. CT chest without contrast recommended to further evaluate. 2. Chronic interstitial changes. No pulmonary edema or focal lung consolidation. Electronically Signed   By: Misty Stanley M.D.   On: 06/16/2022 05:17    Procedures .Critical Care  Performed by: Jordan Pew, MD Authorized by: Jordan Pew, MD   Critical care provider statement:    Critical care time (minutes):  30   Critical care was necessary to treat or prevent imminent or life-threatening deterioration of the following conditions:  Sepsis   Critical care was time spent personally by me on the following activities:  Development of treatment plan with patient or surrogate, discussions with consultants, evaluation of patient's response to treatment, examination of patient, ordering and review of laboratory studies, ordering and review of radiographic studies, ordering and performing treatments and interventions, pulse oximetry, re-evaluation of patient's condition and review of old charts     Medications Ordered in ED Medications   azithromycin (ZITHROMAX) 500 mg in sodium chloride 0.9 % 250 mL IVPB (has no administration in time range)  sodium chloride 0.9 % bolus 1,000 mL (has no administration in time range)  cefTRIAXone (ROCEPHIN) 2 g in sodium chloride 0.9 % 100 mL IVPB (2 g Intravenous New Bag/Given 06/28/2022 0554)  acetaminophen (TYLENOL) tablet 650 mg (650 mg Oral Given 06/17/2022 0554)  sodium chloride 0.9 % bolus 1,000 mL (1,000 mLs Intravenous New Bag/Given 06/25/2022 0554)    ED Course/ Medical Decision Making/ A&P                           Medical Decision Making Amount and/or Complexity of Data Reviewed Labs: ordered. Radiology: ordered. ECG/medicine  tests: ordered.  Risk OTC drugs.   Patient's oral temperature was normal however when rectal temperature obtained it was found to be 102.2.  Sepsis work-up was initiated.  X-ray with new lung findings suspicious for likely pneumonia especially with her cough and hypoxia so I started Rocephin azithromycin for antibiotics pending her CT scan.  Blood cultures and lactic acid were added on.  Still pending urinalysis as well.  CT scan confirmed multifocal pneumonia.  She is more stable with normal heart rate and normal oxygenation while getting fluids, Tylenol, antibiotics and increased oxygen.  Reviewed her labs and her kidney function is significantly off of baseline with elevated creatinine and also acidosis.  She states her lactic acid is normal this seems to be more metabolic in nature.  Possibly dehydration from sepsis.  Will order another liter of fluids to begin over the next couple hours.  Will discuss with hospitalist for admission.  When reviewing her CODE STATUS patient seems to be a little bit confused about what different things mean.  She states she does not want to be on a ventilator or have a breathing tube in.  She would be okay with ICU any other medications that were needed however.  I discussed CPR and she would want CPR however she still would not  want to be on a ventilator.  I tried reasoning with her that part of getting resuscitation" cardiopulmonary arrest included intubation and ventilation she stilts that she did not want that.  At this time I will make her a partial code as this is also the wishes on her MOST form that was signed a year ago.  However she may need more discussion when she is out of this acute illness to really delineate what the different things mean.   Final Clinical Impression(s) / ED Diagnoses Final diagnoses:  Community acquired pneumonia, unspecified laterality  Sepsis, due to unspecified organism, unspecified whether acute organ dysfunction present [facility])  AKI (acute kidney injury) ([place])    Rx / DC Orders ED Discharge Orders          Ordered    Partial code       Comments: May need more discussion when not acutely ill regarding the extent of CPR and/or resuscitation   06/16/2022 0638              Stavros Cail, Corene Cornea, MD 06/10/2022 [phone]

## 2022-06-19 NOTE — ED Notes (Signed)
RT called for breathing treatment. O2 sat 100% on 4L O2 via [place]. Pt has labored breathing. Audible slight wheeze present. HR 104, RR 24.

## 2022-06-19 NOTE — ED Notes (Signed)
Been in communication with provider regarding pt's disposition. This RN did a manual BP: 78/46 See orders

## 2022-06-19 NOTE — ED Notes (Signed)
Breakfast tray given to pt 

## 2022-06-19 NOTE — ED Triage Notes (Signed)
Pt BIB [facility] from [facility] c/o shortness of breath since about midnight. Pt given breathing treatment by facility with some improvement until about 0400. Pt chronically on 3L [place], sats tonight was 83% on 3L. Pt increased to 4L [place] and sats improved to 98%.

## 2022-06-19 NOTE — ED Notes (Signed)
Pt called out stating help, went in to find pt had pulled off NRB; Dr. Colin Mulders informed and would come look at pt until Dr. Wynetta Emery replies  Pt had pulled NRB off and when told she needs to keep it on pt replied it is smothering me; informed pt it is also helping her breath and she needs to leave on; pt asking for water and told she can not have water at this time

## 2022-06-19 NOTE — ED Notes (Signed)
Pt screaming out from room for help stating she can not breath.  Pt has O2 sats of 100% on 4l/[place]  NRB placed on pt and respiratory called to come look at patient; Dr. Cala Bradford and waiting for reply

## 2022-06-19 NOTE — H&P (Signed)
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  She was noted to have fever of 102 and findings of sepsis.  She was tachypneic and tachycardic with an irregular heart rhythm.  Started on sepsis protocol.  Chest x-ray abnormal and CT chest suggesting multifocal pneumonia and findings of pulmonary hypertension.  Fortunately shortness of breath improved by increasing oxygen to 4 L nasal cannula.  Patient started on IV antibiotics and admission was requested for further management.     Past Medical History:  Diagnosis Date   Allergy    seasonal   Anemia    Arthritis    Asthma    uses Advair and Spiriva daily,Albuterol prn.Takes Singulair at bedtime and Prednisone daily   Back spasm    takes Zanaflex daily as needed   Blood transfusion 2007   no abnormal reation noted    Bruises easily    d/t being on Prednisone   Bursitis    left elbow   C. difficile colitis    2007: treated with Flagyl and Vanc, 2014: treated with vanc, Aug 2014: Vanc taper   Chronic back pain    Chronic kidney disease    "creatine" creeping up - followed at this  time by PCP   Complication of anesthesia 2000   woke up during one surgery- ovary surgery   COPD (chronic obstructive pulmonary disease) ([place])    Depression    takes Prozac daily   Dysphasia    Falls    H/O hiatal hernia    Hemorrhoids    History of bronchitis    last time a yr ago   History of colon polyps    History of kidney stones    History of kidney stones    Hyperlipidemia    hx of-was on meds but has been off x 1 1/2 yrs   Hypertension    takes Verapamil nightly   IBS (irritable bowel syndrome)    Insomnia    takes Benadryl as needed   Myocardial infarction [facility]) 2007   Takotsubo cardiomyopathy   Neurogenic bowel    Neuromuscular dysfunction of bladder, unspecified    Nocturia    Osteoporosis    takes Fosamax weekly   Pneumonia    MRSA pneumonia in 2007   Shortness of breath    with exertion daily   Stroke [facility])    "they say I has a small stroke".No deficits   Tingling    and pain in left leg   Urinary frequency     Past Surgical History:  Procedure Laterality Date   APPENDECTOMY     BIOPSY  04/13/2017   Procedure: BIOPSY;  Surgeon: Daneil Dolin, MD;  [facility];  Service: Endoscopy;;  right  and left colon    BIOPSY  07/06/2018   Procedure: BIOPSY;  Surgeon: Danie Binder, MD;  [facility];  Service: Endoscopy;;  Random Colon   CARPAL TUNNEL RELEASE Left    CARPAL TUNNEL RELEASE Right 09/21/2015   Procedure: CARPAL TUNNEL RELEASE;  Surgeon: Carole Civil, MD;  Location: [facility] ORS;  Service: Orthopedics;  Laterality: Right;   cataract surgery Bilateral    CHOLECYSTECTOMY     COLONOSCOPY  09/24/2006   RMR: Normal rectum, left-sided diverticula   COLONOSCOPY  06/2011   Dr. Henrene Pastor: tubular adenomas, moderative diverticulosis, internal hemorrhoids   COLONOSCOPY WITH PROPOFOL N/A 04/13/2017   Dr. Gala Romney: internal hemorrhoids, diverticulosis   CYSTOSCOPY     ENDARTERECTOMY Right 12/12/2015   Procedure: ENDARTERECTOMY RIGHT  CAROTID, RESECTION OF REDUNDANT RIGHT COMMON CAROTID ARTERY WITH PRIMARY REANASTOMOSIS;  Surgeon: Mal Misty, MD;  Location: [facility] OR;  Service: Vascular;  Laterality: Right;   ESOPHAGOGASTRODUODENOSCOPY  04/07/2006   CZY:SAYTKZ esophagus s/p placement of Bravo pH probe, some surgical changes but not typical of fundoplication wrap, may have slipped   FLEXIBLE SIGMOIDOSCOPY N/A 07/06/2018   Procedure: FLEXIBLE SIGMOIDOSCOPY WITH PROPOFOL;  Surgeon: Danie Binder, MD;  [facility];  Service: Endoscopy;  Laterality: N/A;  needs to be done first before the other inpatient    GANGLION CYST EXCISION Right 09/21/2015   Procedure: RIGHT WRIST GANGLION CYST REMOVAL;  Surgeon: Carole Civil, MD;  Location: [facility];  Service: Orthopedics;  Laterality: Right;   hemorrhoidal banding     HERNIA REPAIR     umbilical   KNEE ARTHROSCOPY Right    KNEE SURGERY Right    KYPHOPLASTY N/A 02/23/2018   Procedure: KYPHOPLASTY LUMBAR FOUR;  Surgeon: Newman Pies, MD;  Location: [place];  Service: Neurosurgery;  Laterality: N/A;   LUMBAR LAMINECTOMY/DECOMPRESSION MICRODISCECTOMY Left 10/17/2013   Procedure: LUMBAR LAMINECTOMY/DECOMPRESSION MICRODISCECTOMY 1 LEVEL three/four;  Surgeon: Ophelia Charter, MD;  Location: [place] NEURO ORS;  Service: Neurosurgery;  Laterality: Left;   NECK SURGERY     fusion-    NISSEN FUNDOPLICATION     x 2   OLECRANON BURSECTOMY Left 01/11/2014   Procedure: OLECRANON BURSECTOMY;  Surgeon: Carole Civil, MD;  Location: [facility];  Service: Orthopedics;  Laterality: Left;   OOPHORECTOMY Right    PATCH ANGIOPLASTY Right 12/12/2015   Procedure: PATCH ANGIOPLASTY RIGHT CAROTID ARTERY USING HEMASHIELD PLATINUM FINESSE PATCH;  Surgeon: Mal Misty, MD;  Location: [place];  Service: Vascular;  Laterality: Right;   TONSILLECTOMY       reports that she has never smoked. She has never used smokeless tobacco. She reports that she does not currently use alcohol after a past  usage of about 10.0 standard drinks of alcohol per week. She reports that she does not use drugs.  Allergies  Allergen Reactions   Cardura [Doxazosin Mesylate] Hives   Cardura [Doxazosin]    Strawberry Extract     strawberries   Barbiturates Nausea And Vomiting and Rash   Levofloxacin Rash   Sulfa Antibiotics Nausea Only and Rash    Family History  Problem Relation Age of Onset   Heart disease Father    Colon cancer Neg Hx     Prior to Admission medications   Medication Sig Start Date End Date Taking? Authorizing Provider  diltiazem (CARDIZEM CD) 180 MG 24 hr capsule Take 1 capsule (180 mg total) by mouth daily. 03/27/22 07/02/2022 Yes Shahmehdi, Valeria Batman, MD  Ergocalciferol (VITAMIN  D2) 50 MCG (2000 UT) TABS Take 1 tablet by mouth daily.   Yes [provider]  FLUoxetine (PROZAC) 40 MG capsule Take 40 mg by mouth daily.   Yes [provider]  fluticasone furoate-vilanterol (BREO ELLIPTA) 100-25 MCG/INH AEPB Inhale 1 puff into the lungs daily. 11/11/19  Yes [provider]  gabapentin (NEURONTIN) 100 MG capsule Take 100 mg by mouth daily. 03/16/20  Yes [provider]  ipratropium-albuterol (DUONEB) 0.5-2.5 (3) MG/3ML SOLN Take 3 mLs by nebulization every 6 (six) hours as needed for shortness of breath. 04/25/22  Yes [provider]  iron polysaccharides (NIFEREX) 150 MG capsule Take 1 capsule by mouth daily.   Yes [provider]  lidocaine (LIDODERM) 5 % Place 1 patch onto the skin.   Yes [provider]  megestrol (MEGACE) 400 MG/10ML suspension Take 10 mLs (400 mg total) by mouth daily. 03/26/22  Yes Shahmehdi, Seyed A, MD  Melatonin 3 MG TABS Take 6 mg by mouth at bedtime.    Yes [provider]  metoprolol tartrate (LOPRESSOR) 25 MG tablet Take 1 tablet (25 mg total) by mouth 2 (two) times daily. 07/19/18  Yes Cristal Deer, MD  montelukast (SINGULAIR) 10 MG tablet Take 10 mg by mouth daily.   Yes [provider]  ondansetron (ZOFRAN) 4 MG tablet Take 1 tablet (4 mg total) by mouth every 6 (six) hours as needed for nausea. 03/26/22  Yes Shahmehdi, Seyed A, MD  oxyCODONE (OXY IR/ROXICODONE) 5 MG immediate release tablet Take 5 mg by mouth every 4 (four) hours as needed for moderate pain.   Yes [provider]  Rivaroxaban (XARELTO) 15 MG TABS tablet Take 1 tablet (15 mg total) by mouth daily with supper. Patient taking differently: Take 15 mg by mouth daily. 04/11/22  Yes Strader, [place] M, PA-C  umeclidinium bromide (INCRUSE ELLIPTA) 62.5 MCG/ACT AEPB Inhale 1 puff into the lungs daily.   Yes [provider]    Physical Exam: Vitals:   06/11/2022 0502 07/08/2022 0510 07/06/2022 0600 07/02/2022 0700  BP: 103/64  102/69 101/65  Pulse: 95  (!) 142 88  Resp: 19  (!) 28 (!) 21  Temp: 98.7 F (37.1 C) (!) 102.2 F (39 C)    TempSrc:  Rectal    SpO2: 98%  99% 100%  Weight: 45.4 kg     Height: '5\' 2"'$  (1.575 m)      Constitutional: frail, elderly female, NAD, calm, Uncomfortable.  Eyes: PERRL, lids and conjunctivae normal ENMT: Mucous membranes are moist. Posterior pharynx clear of any exudate or lesions.Normal dentition.  Neck: normal, supple, no masses, no thyromegaly Respiratory: rales RLL, no wheezing, no crackles. Normal respiratory effort. No accessory muscle use.  Cardiovascular: tachycardic, irregularly irregular, normal s1, s2 sounds, no murmurs / rubs / gallops. No extremity edema. 2+ pedal pulses. No carotid bruits.  Abdomen: no tenderness, no masses palpated. No hepatosplenomegaly. Bowel sounds positive.  Musculoskeletal: no clubbing / cyanosis. No joint deformity upper and lower extremities. Good ROM, no contractures. Normal muscle tone.  Skin: no rashes, lesions, ulcers. No induration Neurologic: CN 2-12 grossly intact. Sensation intact, DTR normal. Strength 5/5 in all 4.  Psychiatric: NL judgment and insight. Alert and oriented x 3. Normal mood.   Labs on  Admission: I have personally reviewed following labs and imaging studies  CBC: Recent Labs  Lab 06/15/2022 0504  WBC 10.5  NEUTROABS 9.2*  HGB 8.7*  HCT 27.1*  MCV 96.1  PLT 304  Basic Metabolic Panel: Recent Labs  Lab 07/07/2022 0504  NA 137  K 5.0  CL 115*  CO2 14*  GLUCOSE 96  BUN 48*  CREATININE 2.66*  CALCIUM 8.3*   GFR: Estimated Creatinine Clearance: 12.9 mL/min (A) (by C-G formula based on SCr of 2.66 mg/dL (H)). Liver Function Tests: Recent Labs  Lab 06/25/2022 0504  AST 24  ALT 26  ALKPHOS 59  BILITOT 0.6  PROT 6.6  ALBUMIN 2.8*   No results for input(s): "LIPASE", "AMYLASE" in the last 168 hours. No results for input(s): "AMMONIA" in the last 168 hours. Coagulation Profile: Recent Labs  Lab 06/13/2022 0504  INR 1.3*   Cardiac Enzymes: No results for input(s): "CKTOTAL", "CKMB", "CKMBINDEX", "TROPONINI" in the last 168 hours. BNP (last 3 results) No results for input(s): "PROBNP" in the last 8760 hours. HbA1C: No results for input(s): "HGBA1C" in the last 72 hours. CBG: No results for input(s): "GLUCAP" in the last 168 hours. Lipid Profile: No results for input(s): "CHOL", "HDL", "LDLCALC", "TRIG", "CHOLHDL", "LDLDIRECT" in the last 72 hours. Thyroid Function Tests: No results for input(s): "TSH", "T4TOTAL", "FREET4", "T3FREE", "THYROIDAB" in the last 72 hours. Anemia Panel: No results for input(s): "VITAMINB12", "FOLATE", "FERRITIN", "TIBC", "IRON", "RETICCTPCT" in the last 72 hours. Urine analysis:    Component Value Date/Time   COLORURINE YELLOW 03/23/2022 1200   APPEARANCEUR CLOUDY (A) 03/23/2022 1200   LABSPEC 1.017 03/23/2022 1200   PHURINE 7.0 03/23/2022 1200   GLUCOSEU NEGATIVE 03/23/2022 1200   HGBUR MODERATE (A) 03/23/2022 1200   BILIRUBINUR NEGATIVE 03/23/2022 1200   KETONESUR NEGATIVE 03/23/2022 1200   PROTEINUR 30 (A) 03/23/2022 1200   UROBILINOGEN 0.2 05/20/2007 1717   NITRITE NEGATIVE 03/23/2022 1200   LEUKOCYTESUR LARGE (A)  03/23/2022 1200    Radiological Exams on Admission: CT Chest Wo Contrast  Result Date: 06/17/2022 CLINICAL DATA:  Shortness of breath.  Abnormal chest radiograph. EXAM: CT CHEST WITHOUT CONTRAST TECHNIQUE: Multidetector CT imaging of the chest was performed following the standard protocol without IV contrast. RADIATION DOSE REDUCTION: This exam was performed according to the departmental dose-optimization program which includes automated exposure control, adjustment of the mA and/or kV according to patient size and/or use of iterative reconstruction technique. COMPARISON:  09/07/2021 CT.  Today's chest radiograph. FINDINGS: Cardiovascular: Aortic atherosclerosis. Tortuous thoracic aorta. Mild cardiomegaly, without pericardial effusion. Multivessel coronary artery atherosclerosis. Pulmonary artery enlargement, outflow tract 3.2 cm. Mediastinum/Nodes: New precarinal adenopathy at 1.3 cm on 55/2. Prevascular adenopathy is mild and new at 7 mm. Hilar regions poorly evaluated without intravenous contrast. Tiny hiatal hernia. Lungs/Pleura: No pleural fluid. Right hemidiaphragm elevation. Bilateral, lower lung predominant areas of nodular consolidation. Example 2.3 cm on 80/4 in the right lower lobe. A 1.5 cm area of presumed nodular consolidation in the left lower lobe on 98/4 has surrounding ground-glass. Upper Abdomen: Cholecystectomy. Normal imaged portions of the spleen, left adrenal gland, left kidney. Progressive staghorn calculus within the right kidney with overlying moderate cortical thinning. Mild right adrenal thickening. Musculoskeletal: Advanced degenerative changes of the left shoulder. T10, T12, and L2 moderate compression deformities. The L2 compression deformity is new since 09/07/2021. IMPRESSION: 1. Multifocal, lower lung predominant areas of nodular consolidation which are most likely related to infection. 2. New thoracic adenopathy, favored to be reactive. 3. Recommend appropriate antibiotic  therapy and CT follow-up at 4-6 weeks to confirm resolution of each of these findings. 4. Progressive right-sided staghorn calculus with overlying cortical thinning. 5.  Tiny hiatal hernia. 6. Coronary artery atherosclerosis.  Aortic Atherosclerosis (ICD10-I70.0). 7. Pulmonary artery enlargement suggests pulmonary arterial hypertension. 8. Thoracolumbar compression deformities, including a new L2 compression deformity since 09/07/2021. Electronically Signed   By: Abigail Miyamoto M.D.   On: 06/13/2022 06:25   DG Chest Portable 1 View  Result Date: 07/07/2022 CLINICAL DATA:  Shortness of breath. EXAM: PORTABLE CHEST 1 VIEW COMPARISON:  03/24/2022 FINDINGS: Cardiopericardial silhouette is at upper limits of normal for size. Interstitial markings are diffusely coarsened with chronic features. New nodular opacity is identified in the infrahilar left lower lung. No overt airspace pulmonary edema or substantial pleural effusion. No focal airspace consolidation. Bones are diffusely demineralized. IMPRESSION: 1. New nodular opacity in the infrahilar left lower lung. CT chest without contrast recommended to further evaluate. 2. Chronic interstitial changes. No pulmonary edema or focal lung consolidation. Electronically Signed   By: Misty Stanley M.D.   On: 06/27/2022 05:17    EKG: Independently reviewed. Atrial fibrillation   Assessment/Plan Principal Problem:   Sepsis ([place]) Active Problems:   Multifocal pneumonia   Essential hypertension   Takotsubo syndrome   COPD (chronic obstructive pulmonary disease) (H[facility])   Carotid stenosis   Anemia   Lumbar compression fracture (H[facility])   AKI (acute kidney injury) (HCC)   Protein-calorie malnutrition, severe   Stage 3b chronic kidney disease (CKD) (H[facility])   Neurocognitive deficits   Atrial fibrillation (HCC)   Fever, unspecified   Elevated brain natriuretic peptide (BNP) level   Chronic respiratory failure (H[facility])   Supplemental oxygen dependent   Sepsis from  pneumonia - follow up respiratory panel to rule out Covid infection  - continue IV hydration - no lactic acidosis - continue IV antibiotics and follow cultures  Multifocal pneumonia - follow up respiratory panel  - continue broad spectrum IV antibiotic coverage  - continue supplemental oxygen  - follow cultures  AKI on CKD stage 3b  - likely due to dehydration - treated with IV fluid boluses given in ED - renally dose meds - temporarily holding rivaroxaban due to GFR<15  COPD  - resume home bronchodilators  - resume continuous supplemental oxygen  - oxygen increased to 4L/min for now - hopefully can wean down to baseline of 3L/min  Dysphagia - chronic  - resume regular home dysphagia 1 diet, thickened liquids - aspiration precautions  Anemia of chronic disease - follow Hg closely - continue iron supplement - anticipate Hg may drop after IV fluids - hemoccult stools  Chronic atrial fibrillation  - resume home metoprolol and diltiazem for rate control - taken off of apixaban by cardiology and placed on rivaroxaban - we have to hold rivaroxaban for now due to AKI creatinine<15 - enoxaparin injection for now for anticoagulation dosed by pharm D   DVT prophylaxis: enoxaparin   Code Status: partial, DNI   Family Communication: none present   Disposition Plan: return to SNF   Consults called:   Admission status: INP  Level of care: Telemetry Irwin Brakeman MD [facility] How to contact the [facility] Attending or Consulting provider 7A - 7P or covering provider during after hours 7P -7A, for this patient?  Check the care team in [facility] and look for a) attending/consulting [facility] provider listed and b) the [facility] team listed Log into [email or web address] and use [place] universal password to access. If you do not have the password, please contact the hospital operator. Locate the [facility] provider you are looking for under [facility] and page to a number that you can be  directly reached. If  you still have difficulty reaching the provider, please page the [facility] (Director on Call) for the Hospitalists listed on amion for assistance.   If 7PM-7AM, please contact night-coverage [email or web address] Password [facility]  06/09/2022, 7:48 AM

## 2022-06-19 NOTE — ED Notes (Signed)
Patient transported to CT 

## 2022-06-19 NOTE — Hospital Course (Addendum)
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  She was noted to have fever of 102 and findings of sepsis.  She was tachypneic and tachycardic with an irregular heart rhythm.  Started on sepsis protocol.  Chest x-ray abnormal and CT chest suggesting multifocal pneumonia and findings of pulmonary hypertension.  Work of breathing increased and was placed on bipap.  Patient started on IV antibiotics and admission was requested for further management.

## 2022-06-19 NOTE — ED Notes (Signed)
Assisted pt with breakfast. Pt ate 25% of meal.

## 2022-06-19 NOTE — Progress Notes (Signed)
ANTICOAGULATION CONSULT NOTE - Follow Up Consult  Pharmacy Consult for lovenox Indication: atrial fibrillation  Allergies  Allergen Reactions   Cardura [Doxazosin Mesylate] Hives   Cardura [Doxazosin]    Strawberry Extract     strawberries   Barbiturates Nausea And Vomiting and Rash   Levofloxacin Rash   Sulfa Antibiotics Nausea Only and Rash    Patient Measurements: Height: '5\' 2"'$  (157.5 cm) Weight: 45.4 kg (100 lb 1.4 oz) IBW/kg (Calculated) : 50.1 Heparin Dosing Weight:   Vital Signs: Temp: 102.2 F (39 C) (10/12 0510) Temp Source: Rectal (10/12 0510) BP: 101/65 (10/12 0700) Pulse Rate: 88 (10/12 0700)  Labs: Recent Labs    06/23/2022 0504  HGB 8.7*  HCT 27.1*  PLT 304  APTT 28  LABPROT 15.7*  INR 1.3*  CREATININE 2.66*  TROPONINIHS 9    Estimated Creatinine Clearance: 12.9 mL/min (A) (by C-G formula based on SCr of 2.66 mg/dL (H)).   Medications:  (Not in a hospital admission)   Assessment: Pharmacy consulted to dose lovenox in patient with atrial fibrillation.  Patient is on Xarelto prior to admission with last dose 10/11 0800- holding now due to AKI.  Goal of Therapy:   Monitor platelets by anticoagulation protocol: Yes   Plan:  Lovenox 45 mg subq every 24 hours. Monitor H&H and s/s of bleeding.  Margot Ables, PharmD Clinical Pharmacist 06/18/2022 8:29 AM

## 2022-06-19 NOTE — Progress Notes (Signed)
Transported patient from [facility] to [facility] without incident on Bipap.  Report called to ICU RT before transfer.

## 2022-06-19 NOTE — Progress Notes (Signed)
SLP Cancellation Note  Patient Details Name: JUNELL CULLIFER MRN: [id number] DOB: October 15, 1945   Cancelled treatment:       Reason Eval/Treat Not Completed: Medical issues which prohibited therapy (Pt on Bi-PAP in ED and inappropriate for BSE at this time. SLP will check back tomorrow. Above discussed with RN.)  Thank you,  Genene Churn, [place]  Singer 06/28/2022, 4:01 PM

## 2022-06-19 NOTE — ED Notes (Signed)
Pt has mealtray at bedside

## 2022-06-19 NOTE — ED Notes (Signed)
Pt has a prolapsed rectum. EDP advised to put sugar on rectum and let sit. Will continue with care plan and monitor. Cannot give tylenol suppository at this time for fever. Pt is not coherent enough to swallow pills at this time.

## 2022-06-19 NOTE — Progress Notes (Signed)
Carry over Sepsis - probably pneumonia on ct scan. Increased O2 requirement. 3L nasal cannula at baseline. Aki. Most form at bedside.

## 2022-06-20 ENCOUNTER — Encounter (HOSPITAL_COMMUNITY): Payer: Self-pay | Admitting: Family Medicine

## 2022-06-20 ENCOUNTER — Inpatient Hospital Stay: Payer: Self-pay

## 2022-06-20 DIAGNOSIS — R627 Adult failure to thrive: Secondary | ICD-10-CM | POA: Diagnosis present

## 2022-06-20 DIAGNOSIS — N179 Acute kidney failure, unspecified: Secondary | ICD-10-CM | POA: Diagnosis not present

## 2022-06-20 DIAGNOSIS — R7881 Bacteremia: Secondary | ICD-10-CM

## 2022-06-20 DIAGNOSIS — I4891 Unspecified atrial fibrillation: Secondary | ICD-10-CM | POA: Diagnosis not present

## 2022-06-20 DIAGNOSIS — J9611 Chronic respiratory failure with hypoxia: Secondary | ICD-10-CM | POA: Diagnosis not present

## 2022-06-20 DIAGNOSIS — B9620 Unspecified Escherichia coli [E. coli] as the cause of diseases classified elsewhere: Secondary | ICD-10-CM | POA: Diagnosis present

## 2022-06-20 LAB — BLOOD CULTURE ID PANEL (REFLEXED) - BCID2

## 2022-06-20 LAB — CBC WITH DIFFERENTIAL/PLATELET
Abs Immature Granulocytes: 0 10*3/uL (ref 0.00–0.07)
Band Neutrophils: 5 %
Basophils Absolute: 0 10*3/uL (ref 0.0–0.1)
Basophils Relative: 0 %
Eosinophils Absolute: 0 10*3/uL (ref 0.0–0.5)
Eosinophils Relative: 0 %
HCT: 25.3 % — ABNORMAL LOW (ref 36.0–46.0)
Hemoglobin: 7.7 g/dL — ABNORMAL LOW (ref 12.0–15.0)
Lymphocytes Relative: 2 %
Lymphs Abs: 0.6 10*3/uL — ABNORMAL LOW (ref 0.7–4.0)
MCH: 30.9 pg (ref 26.0–34.0)
MCHC: 30.4 g/dL (ref 30.0–36.0)
MCV: 101.6 fL — ABNORMAL HIGH (ref 80.0–100.0)
Monocytes Absolute: 0.6 10*3/uL (ref 0.1–1.0)
Monocytes Relative: 2 %
Neutro Abs: 28.3 10*3/uL — ABNORMAL HIGH (ref 1.7–7.7)
Neutrophils Relative %: 91 %
Platelets: 168 10*3/uL (ref 150–400)
RBC: 2.49 MIL/uL — ABNORMAL LOW (ref 3.87–5.11)
RDW: 15.4 % (ref 11.5–15.5)
WBC: 29.5 10*3/uL — ABNORMAL HIGH (ref 4.0–10.5)
nRBC: 0 % (ref 0.0–0.2)

## 2022-06-20 LAB — BASIC METABOLIC PANEL
Anion gap: 10 (ref 5–15)
BUN: 53 mg/dL — ABNORMAL HIGH (ref 8–23)
CO2: 10 mmol/L — ABNORMAL LOW (ref 22–32)
Calcium: 7.2 mg/dL — ABNORMAL LOW (ref 8.9–10.3)
Chloride: 122 mmol/L — ABNORMAL HIGH (ref 98–111)
Creatinine, Ser: 3.63 mg/dL — ABNORMAL HIGH (ref 0.44–1.00)
GFR, Estimated: 12 mL/min — ABNORMAL LOW (ref >60)
Glucose, Bld: 129 mg/dL — ABNORMAL HIGH (ref 70–99)
Potassium: 4.2 mmol/L (ref 3.5–5.1)
Sodium: 142 mmol/L (ref 135–145)

## 2022-06-20 LAB — BLOOD GAS, ARTERIAL
Acid-base deficit: 17.8 mmol/L — ABNORMAL HIGH (ref 0.0–2.0)
Bicarbonate: 8 mmol/L — ABNORMAL LOW (ref 20.0–28.0)
Drawn by: 38235
O2 Saturation: 99.6 %
Patient temperature: 37
pCO2 arterial: 20 mmHg — ABNORMAL LOW (ref 32–48)
pH, Arterial: 7.21 — ABNORMAL LOW (ref 7.35–7.45)
pO2, Arterial: 210 mmHg — ABNORMAL HIGH (ref 83–108)

## 2022-06-20 LAB — MRSA NEXT GEN BY PCR, NASAL: MRSA by PCR Next Gen: DETECTED — AB

## 2022-06-20 LAB — MAGNESIUM: Magnesium: 1.7 mg/dL (ref 1.7–2.4)

## 2022-06-20 MED ORDER — SODIUM CHLORIDE 0.9 % IV BOLUS
500.0000 mL | Freq: Once | INTRAVENOUS | Status: AC
  Administered 2022-06-20: 500 mL via INTRAVENOUS

## 2022-06-20 MED ORDER — LORAZEPAM 1 MG PO TABS: 1.0000 mg | ORAL_TABLET | ORAL | Status: DC | PRN

## 2022-06-20 MED ORDER — SODIUM CHLORIDE 0.9 % IV SOLN: 0.5000 mg/h | INTRAVENOUS | Status: DC

## 2022-06-20 MED ORDER — SODIUM CHLORIDE 0.9 % IV SOLN
250.0000 mL | INTRAVENOUS | Status: DC
  Administered 2022-06-20: 250 mL via INTRAVENOUS

## 2022-06-20 MED ORDER — POLYVINYL ALCOHOL 1.4 % OP SOLN: 1.0000 [drp] | Freq: Four times a day (QID) | OPHTHALMIC | Status: DC | PRN

## 2022-06-20 MED ORDER — HALOPERIDOL LACTATE 2 MG/ML PO CONC: 0.5000 mg | ORAL | Status: DC | PRN

## 2022-06-20 MED ORDER — MORPHINE BOLUS VIA INFUSION: 1.0000 mg | INTRAVENOUS | Status: DC | PRN

## 2022-06-20 MED ORDER — GLYCOPYRROLATE 0.2 MG/ML IJ SOLN
0.2000 mg | INTRAMUSCULAR | Status: DC | PRN
  Administered 2022-06-21: 0.2 mg via INTRAVENOUS
  Filled 2022-06-20: qty 1

## 2022-06-20 MED ORDER — LORAZEPAM 2 MG/ML IJ SOLN
1.0000 mg | INTRAMUSCULAR | Status: DC | PRN
  Administered 2022-06-20: 1 mg via INTRAVENOUS
  Filled 2022-06-20: qty 1

## 2022-06-20 MED ORDER — SODIUM CHLORIDE 0.9 % IV SOLN: 1.0000 g | INTRAVENOUS | Status: DC

## 2022-06-20 MED ORDER — GLYCOPYRROLATE 0.2 MG/ML IJ SOLN: 0.2000 mg | INTRAMUSCULAR | Status: DC | PRN

## 2022-06-20 MED ORDER — LORAZEPAM 2 MG/ML PO CONC: 1.0000 mg | ORAL | Status: DC | PRN

## 2022-06-20 MED ORDER — BIOTENE DRY MOUTH MT LIQD: 15.0000 mL | OROMUCOSAL | Status: DC | PRN

## 2022-06-20 MED ORDER — HALOPERIDOL 0.5 MG PO TABS: 0.5000 mg | ORAL_TABLET | ORAL | Status: DC | PRN

## 2022-06-20 MED ORDER — MORPHINE 100MG IN NS 100ML (1MG/ML) PREMIX INFUSION: 1.0000 mg/h | INTRAVENOUS | Status: DC

## 2022-06-20 MED ORDER — GLYCOPYRROLATE 1 MG PO TABS: 1.0000 mg | ORAL_TABLET | ORAL | Status: DC | PRN

## 2022-06-20 MED ORDER — SODIUM CHLORIDE 0.9 % IV SOLN
0.5000 mg/h | INTRAVENOUS | Status: DC
  Administered 2022-06-20 – 2022-06-21 (×2): 0.5 mg/h via INTRAVENOUS
  Filled 2022-06-20: qty 2.5

## 2022-06-20 MED ORDER — HALOPERIDOL LACTATE 5 MG/ML IJ SOLN: 0.5000 mg | INTRAMUSCULAR | Status: DC | PRN

## 2022-06-20 MED ORDER — NOREPINEPHRINE 4 MG/250ML-% IV SOLN
2.0000 ug/min | INTRAVENOUS | Status: DC
  Administered 2022-06-20: 2 ug/min via INTRAVENOUS
  Filled 2022-06-20: qty 250

## 2022-06-20 MED ORDER — NOREPINEPHRINE 4 MG/250ML-% IV SOLN: 0.0000 ug/min | INTRAVENOUS | Status: DC

## 2022-06-20 NOTE — Progress Notes (Signed)
Initial Nutrition Assessment  DOCUMENTATION CODES:      INTERVENTION:  Dysphagia 1 diet with honey thick liquids  Magic cup TID with meals   Assist with feeding as patient desires  NUTRITION DIAGNOSIS:   Inadequate oral intake related to dysphagia, acute illness (severe sepsis) as evidenced by energy intake < or equal to 50% for > or equal to 5 days.   GOAL:   (Comfort care focus)  MONITOR:   (transition)  REASON FOR ASSESSMENT:   Consult    ASSESSMENT: Patient is a 76 yo female with shortness of breath, CKD-3, dysphagia, HTN, chronic anemia, lumbar compression fractures, FTT in adult. Presents from [facility] yesterday severe sepsis.  Requiring BiPAP. Patient actively dying per MD note. Patient has now transitioned to full comfort measures.   WOC assessment completed. DTPI x 2 and Stage 1 to left and right heel.  Ethics committee consult. Energy protein needs not calculated due to patient comfort care status. Morphine drip.   Patient ate 25% of breakfast. Energy intake </= 50% for >/= 5 days. Expect patient is malnourished. Unable to completed nutrition focused exam at time of visit.  Medications: dilaudid.     Latest Ref Rng & Units 06/20/2022    3:50 AM 07/07/2022    5:04 AM 03/26/2022    6:37 AM  BMP  Glucose 70 - 99 mg/dL 129  96  109   BUN 8 - 23 mg/dL 53  48  19   Creatinine 0.44 - 1.00 mg/dL 3.63  2.66  1.38   Sodium 135 - 145 mmol/L 142  137  141   Potassium 3.5 - 5.1 mmol/L 4.2  5.0  4.0   Chloride 98 - 111 mmol/L 122  115  109   CO2 22 - 32 mmol/L '10  14  26   '$ Calcium 8.9 - 10.3 mg/dL 7.2  8.3  9.3      Diet Order:   Diet Order             DIET - DYS 1 Room service appropriate? Yes; Fluid consistency: Honey Thick  Diet effective now                   EDUCATION NEEDS:  Not appropriate for education at this timeN/A  Skin:  Skin Assessment: Skin Integrity Issues: Skin Integrity Issues::  (noted above.- see WOC note for details)  Last  BM:  10/13  Height:   Ht Readings from Last 1 Encounters:  06/16/2022 '5\' 2"'$  (1.575 m)    Weight:   Wt Readings from Last 1 Encounters:  06/20/22 47.3 kg    Ideal Body Weight:   50 kg  BMI:  Body mass index is 19.07 kg/m.   Colman Cater MS,RD,CSG,LDN Contact: Shea Evans

## 2022-06-20 NOTE — Progress Notes (Addendum)
  Interdisciplinary Goals of Care Family Meeting   Date carried out: 06/20/2022  Location of the meeting: Phone conference  Member's involved: Physician, Bedside Registered Nurse, and Family Member or next of kin  Durable Power of Attorney or acting medical decision maker: friends, cousin in [place] (phone) only next of kin we could locate. Unable to reach brother (has dementia), call to brother's spouse multiple times unable to reach.  Discussion: We discussed goals of care for Jordan [name] .  Pt is actively dying with gram negative rod bacteremia, severe sepsis, multifocal pneumonia with respiratory failure.  Pt is now suffering with increasing work of breathing, increasing somnolence and increasing respiratory distress.  Pt had a MOST form that expressed Do Not Intubate.  Bipap trial is failing.  ABG worsening.  I recommend transitioning to full comfort measures and family member in agreement and requested that patient be kept comfortable.    Code status: Full DNR  Disposition: In-patient comfort care and will speak with social worker about hospice bed availability  Time spent for the meeting: 35 mins   Irwin Brakeman, MD  06/20/2022, 9:43 AM

## 2022-06-20 NOTE — Progress Notes (Signed)
Giving patient 30 min break from BIPAP then will place back on.

## 2022-06-20 NOTE — Progress Notes (Signed)
Report received from [facility], pt resting comfortably with Dilaudid gtt infusing at 0.'75mg'$ /hr. family updated at bedside.

## 2022-06-20 NOTE — Progress Notes (Deleted)
Patient has metabolic acidosis and is uncomfortable on BIPAP. Placed patient on 2 lpm nasal cannula.

## 2022-06-20 NOTE — Progress Notes (Signed)
Attempted to call Abner Greenspan (Brother). His wife, Deniqua Perry, answered the phone. I attempted to explain to her that I needed to update the pt's current admission to [facility]. Pamala Hurry refused communication with Edd Arbour and she refused to give any information. Explained to Lyndel Pleasure that will attempt to call again later.   Called Chere Weeks (Pt's friend and main contact) next. Chere clarified information concerning the family dynamics. Chere and her husband, Konrad Dolores, have been the pt's main support system.   According to [place], Pt does not have any children or partners. Edd Arbour currently has dementia and is in a nursing home. He is unable to provide any information or make decisions. Pamala Hurry (pt's sister in [place]) lives in her own home and has not shown any interest in getting involved in managing care for the pt. Pt's Niece, Maudie Mercury, has been asked in the past to be involved but she refused as well.   Chere and Konrad Dolores were very emotional and worried about the pt's well being and how her care is being managed. They want to be involved but are worried about being financially responsible for pt's care or any end of life choices, and funeral costs. They also are worried about making any choices that may upset the pt's remaining family members.   Chere and Tommy requested to communicate with the ethics committee to have a discussion with remaining pt's family to decide the appropriate POA and the best care options for the pt.   Ethics Committee order placed.

## 2022-06-20 NOTE — Progress Notes (Addendum)
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  She was noted to have fever of 102 and findings of sepsis.  She was tachypneic and tachycardic with an irregular heart rhythm.  Started on sepsis protocol.  Chest x-ray abnormal and CT chest suggesting multifocal pneumonia and findings of pulmonary hypertension.  Fortunately shortness of breath improved by increasing oxygen to 4 L nasal cannula.  Patient started on IV antibiotics and admission was requested for further management.     Assessment and Plan: Principal Problem:   Severe Sepsis Active Problems:   Multifocal pneumonia   FTT (failure to thrive) in adult   E. coli bacteremia   Essential hypertension   Takotsubo syndrome   COPD (chronic obstructive pulmonary disease) (HCC)   Carotid stenosis   Anemia   Lumbar compression fracture (HCC)   AKI (acute kidney injury) (HCC)   Protein-calorie malnutrition, severe   Stage 3b chronic kidney disease (CKD) (HCC)   Neurocognitive deficits   Atrial fibrillation (HCC)   Fever, unspecified   Elevated brain natriuretic peptide (BNP) level   Chronic respiratory failure (HCC)   Supplemental oxygen dependent  Pt presents with severe sepsis, e coli  bacteremia, multifocal pneumonia now with progressive hypoxia, respiratory distress, hypotension, increased work of breathing.  Pt had expressed her wish was to NOT be intubated and she confirmed that with me yesterday.  Bipap therapy has failed.  I had a goals of care discussion urgently called in friends, spoke with cousin on phone and all in agreement to transition to full comfort care measures as patient is actively dying and to focus care on comfort and dignity.    DVT prophylaxis: full comfort care  Code Status: DNR Family Communication: call to brother's wife no response, call from cousin in [place] Mr Lavone Neri, close friends and caretakers at bedside this morning  Disposition: anticipating hospital death  Remains inpatient appropriate because: IV infusion required    Consultants:   Procedures:  bipap Antimicrobials:  Ceftriaxone / azithromycin 10/12>>  Subjective: Pt is in severe respiratory distress, terminally ill  Objective: Vitals:   06/20/22 0756 06/20/22 0757 06/20/22 0758 06/20/22 0824  BP:      Pulse:      Resp: 20 20 (!) 26   Temp:      TempSrc:      SpO2:   92% 100%  Weight:      Height:        Intake/Output Summary (Last 24 hours) at 06/20/2022 1240 Last data filed at 06/20/2022 0740 Gross per 24 hour  Intake 1388.43 ml  Output --  Net 1388.43 ml   Filed Weights   06/10/2022 0502 06/30/2022 2150 06/20/22 0500  Weight: 45.4 kg 47.3 kg 47.3 kg   Examination:  General exam: Appears terminally ill with agonal breathing.   Respiratory system: agonal breathing, severe respiratory distress  Cardiovascular system: irregularly irregular, tachycardic.  Gastrointestinal system: Abdomen is nondistended, soft and nontender. No organomegaly or masses felt. Normal bowel sounds heard. Central nervous system: obtunded.  Psychiatry: obtunded.    Data Reviewed: I have personally reviewed following labs and imaging studies  CBC: Recent Labs  Lab 06/20/2022 0504  06/20/22 0350  WBC 10.5 29.5*  NEUTROABS 9.2* 28.3*  HGB 8.7* 7.7*  HCT 27.1* 25.3*  MCV 96.1 101.6*  PLT 304 630    Basic Metabolic Panel: Recent Labs  Lab 06/10/2022 0504 06/20/22 0350  NA 137 142  K 5.0 4.2  CL 115* 122*  CO2 14* 10*  GLUCOSE 96 129*  BUN 48* 53*  CREATININE 2.66* 3.63*  CALCIUM 8.3* 7.2*  MG  --  1.7    CBG: Recent Labs  Lab 06/17/2022 2218  GLUCAP 113*    Recent Results (from the past 240 hour(s))  Blood Culture (routine x 2)     Status: None (Preliminary result)   Collection Time: 06/17/2022  5:53 AM   Specimen: BLOOD RIGHT FOREARM  Result Value Ref Range Status   Specimen Description   Final    BLOOD RIGHT FOREARM BOTTLES DRAWN AEROBIC AND ANAEROBIC Performed at [facility], [address], [place], [place] [address]    Special Requests   Final    Blood Culture adequate volume Performed at [facility], [address], [place], [place] [address]    Culture  Setup Time   Final    BOTTLES DRAWN AEROBIC AND ANAEROBIC GRAM NEGATIVE RODS Gram Stain Report Called to,Read Back By and Verified With: T TALBOTT RN [id number] Organism ID to follow CRITICAL RESULT CALLED TO, READ BACK BY AND VERIFIED WITH: N ROME,RN'@0142'$  06/20/22 [place] Performed at [facility], [place] [address], [place], [place] [address]    Culture GRAM NEGATIVE RODS  Final   Report Status PENDING  Incomplete  Blood Culture (routine x 2)     Status: None (Preliminary result)   Collection Time: 06/16/2022  5:53 AM   Specimen: BLOOD LEFT FOREARM  Result Value Ref Range Status   Specimen Description   Final    BLOOD LEFT FOREARM BOTTLES DRAWN AEROBIC AND ANAEROBIC Performed at [facility], [address], [place], [place] [address]    Special Requests   Final    Blood Culture adequate volume Performed at [facility], [address] [place], [place] [address]    Culture  Setup Time   Final    BOTTLES DRAWN AEROBIC AND ANAEROBIC GRAM NEGATIVE RODS CRITICAL VALUE NOTED.  VALUE  IS CONSISTENT WITH PREVIOUSLY REPORTED AND CALLED VALUE. Performed at [facility], [address], [place], [place] [address]    Culture   Final    Lonell Grandchild NEGATIVE RODS CULTURE REINCUBATED FOR BETTER GROWTH Performed at [facility], [place] [address] [place], [place] [address]    Report Status PENDING  Incomplete  Blood Culture ID Panel (Reflexed)     Status: Abnormal   Collection Time: 06/24/2022  5:53 AM  Result Value Ref Range Status   Enterococcus faecalis NOT DETECTED NOT DETECTED Final   Enterococcus Faecium NOT DETECTED NOT DETECTED Final   Listeria monocytogenes NOT DETECTED NOT DETECTED Final   Staphylococcus species NOT DETECTED NOT DETECTED Final   Staphylococcus aureus (BCID) NOT DETECTED NOT DETECTED Final   Staphylococcus epidermidis NOT DETECTED NOT DETECTED Final   Staphylococcus lugdunensis NOT DETECTED NOT DETECTED Final   Streptococcus species NOT DETECTED NOT DETECTED Final  Streptococcus agalactiae NOT DETECTED NOT DETECTED Final   Streptococcus pneumoniae NOT DETECTED NOT DETECTED Final   Streptococcus pyogenes NOT DETECTED NOT DETECTED Final   A.calcoaceticus-baumannii NOT DETECTED NOT DETECTED Final   Bacteroides fragilis NOT DETECTED NOT DETECTED Final   Enterobacterales DETECTED (A) NOT DETECTED Final    Comment: Enterobacterales represent a large order of gram negative bacteria, not a single organism. CRITICAL RESULT CALLED TO, READ BACK BY AND VERIFIED WITH: N ROME,RN'@0142'$  06/20/22 [place]    Enterobacter cloacae complex NOT DETECTED NOT DETECTED Final   Escherichia coli DETECTED (A) NOT DETECTED Final    Comment: CRITICAL RESULT CALLED TO, READ BACK BY AND VERIFIED WITH: N ROME,RN'@0142'$  06/20/22 [place]    Klebsiella aerogenes NOT DETECTED NOT DETECTED Final   Klebsiella oxytoca NOT DETECTED NOT DETECTED Final   Klebsiella pneumoniae NOT DETECTED NOT DETECTED Final   Proteus species NOT DETECTED NOT DETECTED Final   Salmonella species NOT DETECTED NOT  DETECTED Final   Serratia marcescens NOT DETECTED NOT DETECTED Final   Haemophilus influenzae NOT DETECTED NOT DETECTED Final   Neisseria meningitidis NOT DETECTED NOT DETECTED Final   Pseudomonas aeruginosa NOT DETECTED NOT DETECTED Final   Stenotrophomonas maltophilia NOT DETECTED NOT DETECTED Final   Candida albicans NOT DETECTED NOT DETECTED Final   Candida auris NOT DETECTED NOT DETECTED Final   Candida glabrata NOT DETECTED NOT DETECTED Final   Candida krusei NOT DETECTED NOT DETECTED Final   Candida parapsilosis NOT DETECTED NOT DETECTED Final   Candida tropicalis NOT DETECTED NOT DETECTED Final   Cryptococcus neoformans/gattii NOT DETECTED NOT DETECTED Final   CTX-M ESBL DETECTED (A) NOT DETECTED Final    Comment: CRITICAL RESULT CALLED TO, READ BACK BY AND VERIFIED WITH: N ROME,RN'@0142'$  06/20/22 [place] (NOTE) Extended spectrum beta-lactamase detected. Recommend a carbapenem as initial therapy.      Carbapenem resistance IMP NOT DETECTED NOT DETECTED Final   Carbapenem resistance KPC NOT DETECTED NOT DETECTED Final   Carbapenem resistance NDM NOT DETECTED NOT DETECTED Final   Carbapenem resist OXA 48 LIKE NOT DETECTED NOT DETECTED Final   Carbapenem resistance VIM NOT DETECTED NOT DETECTED Final    Comment: Performed at [facility], [place] [address], [place], [place] [address]  Resp Panel by RT-PCR (Flu A&B, Covid) Anterior Nasal Swab     Status: None   Collection Time: 06/13/2022  7:31 AM   Specimen: Anterior Nasal Swab  Result Value Ref Range Status   SARS Coronavirus 2 by RT PCR NEGATIVE NEGATIVE Final    Comment: (NOTE) SARS-CoV-2 target nucleic acids are NOT DETECTED.  The SARS-CoV-2 RNA is generally detectable in upper respiratory specimens during the acute phase of infection. The lowest concentration of SARS-CoV-2 viral copies this assay can detect is 138 copies/mL. A negative result does not preclude SARS-Cov-2 infection and should not be used as the sole basis  for treatment or other patient management decisions. A negative result may occur with  improper specimen collection/handling, submission of specimen other than nasopharyngeal swab, presence of viral mutation(s) within the areas targeted by this assay, and inadequate number of viral copies(<138 copies/mL). A negative result must be combined with clinical observations, patient history, and epidemiological information. The expected result is Negative.  Fact Sheet for Patients:  EntrepreneurPulse.com.au  Fact Sheet for Healthcare Providers:  IncredibleEmployment.be  This test is no t yet approved or cleared by the [place] FDA and  has been authorized for detection and/or diagnosis of SARS-CoV-2 by FDA under an Emergency  Use Authorization (EUA). This EUA will remain  in effect (meaning this test can be used) for the duration of the COVID-19 declaration under Section 564(b)(1) of the Act, 21 U.S.C.section 360bbb-3(b)(1), unless the authorization is terminated  or revoked sooner.       Influenza A by PCR NEGATIVE NEGATIVE Final   Influenza B by PCR NEGATIVE NEGATIVE Final    Comment: (NOTE) The Xpert Xpress SARS-CoV-2/FLU/RSV plus assay is intended as an aid in the diagnosis of influenza from Nasopharyngeal swab specimens and should not be used as a sole basis for treatment. Nasal washings and aspirates are unacceptable for Xpert Xpress SARS-CoV-2/FLU/RSV testing.  Fact Sheet for Patients: EntrepreneurPulse.com.au  Fact Sheet for Healthcare Providers: IncredibleEmployment.be  This test is not yet approved or cleared by the [place] FDA and has been authorized for detection and/or diagnosis of SARS-CoV-2 by FDA under an Emergency Use Authorization (EUA). This EUA will remain in effect (meaning this test can be used) for the duration of the COVID-19 declaration under Section 564(b)(1) of the Act, 21  U.S.C. section 360bbb-3(b)(1), unless the authorization is terminated or revoked.  Performed at [facility], [address] [place], [place] [address]   MRSA Next Gen by PCR, Nasal     Status: Abnormal   Collection Time: 06/20/22  1:26 AM   Specimen: Nasal Mucosa; Nasal Swab  Result Value Ref Range Status   MRSA by PCR Next Gen DETECTED (A) NOT DETECTED Final    Comment: RESULT CALLED TO, READ BACK BY AND VERIFIED WITH: ROOS G @ 0949 ON [id number] BY HENDERSON L (NOTE) The GeneXpert MRSA Assay (FDA approved for NASAL specimens only), is one component of a comprehensive MRSA colonization surveillance program. It is not intended to diagnose MRSA infection nor to guide or monitor treatment for MRSA infections. Test performance is not FDA approved in patients less than 66 years old. Performed at [facility], [address] [place], [place] [address]      Radiology Studies: [place] EKG SITE RITE  Result Date: 06/20/2022 If Site Rite image not attached, placement could not be confirmed due to current cardiac rhythm.  DG CHEST PORT 1 VIEW  Result Date: 06/28/2022 CLINICAL DATA:  Dyspnea, wheezing EXAM: PORTABLE CHEST 1 VIEW COMPARISON:  Chest radiograph from earlier today. FINDINGS: Right rotated chest radiograph. No pneumothorax or pleural effusion. No pulmonary edema. Indistinct mild hazy bilateral lower lung opacities are unchanged. Stable cardiomediastinal silhouette with normal heart size. IMPRESSION: Indistinct mild hazy bilateral lower lung opacities, unchanged, favor multifocal pneumonia as described on chest CT from earlier today. Follow-up noncontrast chest CT recommended in 4-6 weeks as per chest CT report earlier today. Electronically Signed   By: Ilona Sorrel M.D.   On: 06/21/2022 14:34   CT Chest Wo Contrast  Result Date: 06/25/2022 CLINICAL DATA:  Shortness of breath.  Abnormal chest radiograph. EXAM: CT CHEST WITHOUT CONTRAST TECHNIQUE: Multidetector CT imaging of the chest was  performed following the standard protocol without IV contrast. RADIATION DOSE REDUCTION: This exam was performed according to the departmental dose-optimization program which includes automated exposure control, adjustment of the mA and/or kV according to patient size and/or use of iterative reconstruction technique. COMPARISON:  09/07/2021 CT.  Today's chest radiograph. FINDINGS: Cardiovascular: Aortic atherosclerosis. Tortuous thoracic aorta. Mild cardiomegaly, without pericardial effusion. Multivessel coronary artery atherosclerosis. Pulmonary artery enlargement, outflow tract 3.2 cm. Mediastinum/Nodes: New precarinal adenopathy at 1.3 cm on 55/2. Prevascular adenopathy is mild and new at 7 mm. Hilar regions poorly evaluated  without intravenous contrast. Tiny hiatal hernia. Lungs/Pleura: No pleural fluid. Right hemidiaphragm elevation. Bilateral, lower lung predominant areas of nodular consolidation. Example 2.3 cm on 80/4 in the right lower lobe. A 1.5 cm area of presumed nodular consolidation in the left lower lobe on 98/4 has surrounding ground-glass. Upper Abdomen: Cholecystectomy. Normal imaged portions of the spleen, left adrenal gland, left kidney. Progressive staghorn calculus within the right kidney with overlying moderate cortical thinning. Mild right adrenal thickening. Musculoskeletal: Advanced degenerative changes of the left shoulder. T10, T12, and L2 moderate compression deformities. The L2 compression deformity is new since 09/07/2021. IMPRESSION: 1. Multifocal, lower lung predominant areas of nodular consolidation which are most likely related to infection. 2. New thoracic adenopathy, favored to be reactive. 3. Recommend appropriate antibiotic therapy and CT follow-up at 4-6 weeks to confirm resolution of each of these findings. 4. Progressive right-sided staghorn calculus with overlying cortical thinning. 5.  Tiny hiatal hernia. 6. Coronary artery atherosclerosis. Aortic Atherosclerosis  (ICD10-I70.0). 7. Pulmonary artery enlargement suggests pulmonary arterial hypertension. 8. Thoracolumbar compression deformities, including a new L2 compression deformity since 09/07/2021. Electronically Signed   By: Abigail Miyamoto M.D.   On: 06/10/2022 06:25   DG Chest Portable 1 View  Result Date: 06/20/2022 CLINICAL DATA:  Shortness of breath. EXAM: PORTABLE CHEST 1 VIEW COMPARISON:  03/24/2022 FINDINGS: Cardiopericardial silhouette is at upper limits of normal for size. Interstitial markings are diffusely coarsened with chronic features. New nodular opacity is identified in the infrahilar left lower lung. No overt airspace pulmonary edema or substantial pleural effusion. No focal airspace consolidation. Bones are diffusely demineralized. IMPRESSION: 1. New nodular opacity in the infrahilar left lower lung. CT chest without contrast recommended to further evaluate. 2. Chronic interstitial changes. No pulmonary edema or focal lung consolidation. Electronically Signed   By: Misty Stanley M.D.   On: 07/07/2022 05:17    Scheduled Meds: Continuous Infusions:  sodium chloride 10 mL/hr at 06/20/22 0740   HYDROmorphone 0.5 mg/hr (06/20/22 1046)     LOS: 1 day   Critical Care Procedure Note Authorized and Performed by: Murvin Natal MD  Total Critical Care time:  50 mins Due to a high probability of clinically significant, life threatening deterioration, the patient required my highest level of preparedness to intervene emergently and I personally spent this critical care time directly and personally managing the patient.  This critical care time included obtaining a history; examining the patient, pulse oximetry; ordering and review of studies; arranging urgent treatment with development of a management plan; evaluation of patient's response of treatment; frequent reassessment; and discussions with other providers.  This critical care time was performed to assess and manage the high probability of imminent  and life threatening deterioration that could result in multi-organ failure.  It was exclusive of separately billable procedures and treating other patients and teaching time.    Irwin Brakeman, MD How to contact the [facility] Attending or Consulting provider [place] or covering provider during after hours [place], for this patient?  Check the care team in [facility] and look for a) attending/consulting TRH provider listed and b) the [facility] team listed Log into [email or web address] and use [place]'s universal password to access. If you do not have the password, please contact the hospital operator. Locate the [facility] provider you are looking for under [facility] and page to a number that you can be directly reached. If you still have difficulty reaching the provider, please page the [facility] (Director on Call) for the Hospitalists  listed on amion for assistance.  06/20/2022, 12:40 PM

## 2022-06-20 NOTE — Consult Note (Signed)
WOC Nurse Consult Note: Reason for Consult:Several areas of intact, but nonblanchable erythema noted, deep tissue pressure injury vs Stage 1 PI present on admission. Right buttocks, bilateral heels and right upper back affected. Patient is on a mattress replacement with low air loss feature as she is in ICU. Patient is critically ill. Wound type:Pressure Pressure Injury POA: Yes Measurement:Per Bedside RN E.Tinajero: Right buttock (DTPI):  4cm x 5cm  Right upper back (DTPI): 6.5cm x 4cm  Left heel (Stage 1):  2cm x 3cm Right heel (Stage 1): 3cm x 4cm Wound bed:Dry, red, intact Drainage (amount, consistency, odor) None Periwound: dry, intact Dressing procedure/placement/frequency: Silicone foam dressings are implemented to all areas of concern with guidance provided to lift and assess the skin each shift and to document assessment. Turning and repositioning is in place with guidance provided to minimize time in the supine position. A silicone foam dressing is to be placed prophylactically to the sacrum. Bilateral Pressure redistribution heel boots are to be placed for both treatment and prevention.The aforementioned mattress replacement with low air loss feature is in place to mitigate pressure and moisture. DermaTherapy bed linens are being used to mitigate microclimate and decrease friction (low CoF bed linen system).  I communicated with Bedside RN E Tinajero via Secure Chat this morning. His assistance in obtaining measurements was appreciated.  [place] nursing team will not follow, but will remain available to this patient, the nursing and medical teams.  Please re-consult if needed.  Thank you for inviting [place] to participate in this patient's Plan of Care.  Maudie Flakes, MSN, RN, CNS, [place], Serita Grammes, [employer], [employer] phone:  [phone]

## 2022-06-20 NOTE — Progress Notes (Signed)
Placed patient back on BIPAP.

## 2022-06-21 ENCOUNTER — Other Ambulatory Visit: Payer: Self-pay

## 2022-06-21 DIAGNOSIS — A4151 Sepsis due to Escherichia coli [E. coli]: Secondary | ICD-10-CM | POA: Diagnosis not present

## 2022-06-21 DIAGNOSIS — I4891 Unspecified atrial fibrillation: Secondary | ICD-10-CM | POA: Diagnosis not present

## 2022-06-21 DIAGNOSIS — R627 Adult failure to thrive: Secondary | ICD-10-CM | POA: Diagnosis not present

## 2022-06-21 DIAGNOSIS — R7881 Bacteremia: Secondary | ICD-10-CM | POA: Diagnosis not present

## 2022-06-21 LAB — CULTURE, BLOOD (ROUTINE X 2)
Special Requests: ADEQUATE
Special Requests: ADEQUATE

## 2022-06-21 MED ORDER — HYDROMORPHONE HCL PF 10 MG/ML IJ SOLN
INTRAMUSCULAR | Status: AC
  Filled 2022-06-21: qty 5

## 2022-06-21 NOTE — TOC Progression Note (Signed)
  Transition of Care [facility]) Screening Note   Patient Details  Name: Jordan [name] Date of Birth: 1946-05-17   Transition of Care [facility]) CM/SW Contact:    Shade Flood, LCSW Phone Number: 06/21/2022, 9:17 AM    Pt under full comfort care.  Transition of Care Department [facility]) has reviewed patient and no TOC needs have been identified at this time. We will continue to monitor patient advancement through interdisciplinary progression rounds. If new patient transition needs arise, please place a TOC consult.

## 2022-06-21 NOTE — Progress Notes (Signed)
Pt arrived to room 323 via bed from ICU. Hydromorphone drip infusing per order. Pt non-responsive, deep slow moaning sounding respirations at 8/min, breath sounds gurgling. Nurse to admin robinul IV for secretions. AP 110/min and irregular. Skin warm and pink, cap refill < 3 sec, no mottling noted. No facial grimacing with touch or movement, face relaxed. Pt does not appear in any distress or discomfort at this time. Bed alarm on, lights dimmed. No family at bedside at this time.

## 2022-06-21 NOTE — Progress Notes (Signed)
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  She was noted to have fever of 102 and findings of sepsis.  She was tachypneic and tachycardic with an irregular heart rhythm.  Started on sepsis protocol.  Chest x-ray abnormal and CT chest suggesting multifocal pneumonia and findings of pulmonary hypertension.  Fortunately shortness of breath improved by increasing oxygen to 4 L nasal cannula.  Patient started on IV antibiotics and admission was requested for further management.     Assessment and Plan: Principal Problem:   Severe Sepsis Active Problems:   Multifocal pneumonia   FTT (failure to thrive) in adult   E. coli bacteremia   Essential hypertension   Takotsubo syndrome   COPD (chronic obstructive pulmonary disease) (HCC)   Carotid stenosis   Anemia   Lumbar compression fracture (HCC)   AKI (acute kidney injury) (HCC)   Protein-calorie malnutrition, severe   Stage 3b chronic kidney disease (CKD) (HCC)   Neurocognitive deficits   Atrial fibrillation (HCC)   Fever, unspecified   Elevated brain natriuretic peptide (BNP) level   Chronic respiratory failure (HCC)   Supplemental oxygen dependent  Pt presented with severe sepsis, e coli  bacteremia, multifocal pneumonia now with progressive hypoxia, respiratory distress, hypotension, increased work of breathing.  Pt had expressed her wish was to NOT be intubated and she confirmed that with me yesterday.  Bipap therapy has failed.  I had a goals of care discussion urgently called in friends, spoke with cousin on phone and all in agreement to transition to full comfort care measures as patient is actively dying and to focus care on comfort and dignity.   Pt remains on full comfort measures.  Continue IV dilaudid infusion as breathing appears much better today.     DVT prophylaxis: full comfort care  Code Status: DNR Family Communication: call to brother's wife no response, call from cousin in [place] Mr Lavone Neri, close friends and caretakers at bedside  Disposition: anticipating hospital death  Remains inpatient appropriate because: IV infusion required    Consultants:   Procedures:  bipap Antimicrobials:  Ceftriaxone / azithromycin 10/12>> 10/13 Subjective: Pt appears comfortable.  Objective: Vitals:   06/20/22 2100 06/20/22 2200 06/20/22 2300 06/21/22 0000  BP:      Pulse:  (!) 125 (!) 120 (!) 121  Resp: (!) 8 (!) 8 (!) 7 12  Temp:      TempSrc:      SpO2:  100% 100% 100%  Weight:      Height:        Intake/Output Summary (Last 24 hours) at 06/21/2022 1206 Last data filed at 06/21/2022 0044 Gross per 24 hour  Intake 135.04 ml  Output --  Net 135.04 ml   Filed Weights   07/08/2022 0502 06/14/2022 2150 06/20/22 0500  Weight: 45.4 kg 47.3 kg 47.3 kg   Examination:  General exam: Appears terminally ill with cheyne stokes respirations.   Respiratory system: cheyne stokes respirations Cardiovascular system: irregularly irregular, tachycardic.  Gastrointestinal system: Abdomen is nondistended, soft and nontender. No organomegaly or masses felt. Normal bowel sounds heard. Central nervous system: obtunded.  Psychiatry: obtunded.    Data Reviewed: I have  personally reviewed following labs and imaging studies  CBC: Recent Labs  Lab 06/10/2022 0504 06/20/22 0350  WBC 10.5 29.5*  NEUTROABS 9.2* 28.3*  HGB 8.7* 7.7*  HCT 27.1* 25.3*  MCV 96.1 101.6*  PLT 304 355    Basic Metabolic Panel: Recent Labs  Lab 06/20/2022 0504 06/20/22 0350  NA 137 142  K 5.0 4.2  CL 115* 122*  CO2 14* 10*  GLUCOSE 96 129*  BUN 48* 53*  CREATININE 2.66* 3.63*  CALCIUM 8.3* 7.2*  MG  --  1.7    CBG: Recent Labs  Lab 06/26/2022 2218  GLUCAP 113*    Recent Results (from the past 240 hour(s))  Blood Culture (routine x 2)     Status: Abnormal   Collection Time: 06/30/2022  5:53 AM   Specimen: BLOOD RIGHT FOREARM  Result Value Ref Range Status   Specimen Description   Final    BLOOD RIGHT FOREARM BOTTLES DRAWN AEROBIC AND ANAEROBIC Performed at [facility], [address], [place], [place] [address]    Special Requests   Final    Blood Culture adequate volume Performed at [facility], [address], [place], [place] [address]    Culture  Setup Time   Final    BOTTLES DRAWN AEROBIC AND ANAEROBIC GRAM NEGATIVE RODS Gram Stain Report Called to,Read Back By and Verified With: T TALBOTT RN [id number] [facility] Organism ID to follow CRITICAL RESULT CALLED TO, READ BACK BY AND VERIFIED WITH: N ROME,RN'@0142'$  06/20/22 [place]    Culture (A)  Final    ESCHERICHIA COLI SUSCEPTIBILITIES PERFORMED ON PREVIOUS CULTURE WITHIN THE LAST 5 DAYS. Performed at [facility], [place] [address], [place], [place] [address]    Report Status 06/21/2022 FINAL  Final  Blood Culture (routine x 2)     Status: Abnormal   Collection Time: 06/28/2022  5:53 AM   Specimen: BLOOD LEFT FOREARM  Result Value Ref Range Status   Specimen Description   Final    BLOOD LEFT FOREARM BOTTLES DRAWN AEROBIC AND ANAEROBIC Performed at [facility], [address] [place], [place] [address]    Special Requests   Final    Blood Culture adequate volume Performed at [facility], [address] [place], [place] [address]    Culture  Setup Time   Final    BOTTLES DRAWN AEROBIC AND ANAEROBIC GRAM NEGATIVE RODS CRITICAL VALUE NOTED.  VALUE IS CONSISTENT WITH PREVIOUSLY REPORTED AND CALLED VALUE. Performed at [facility], [address] [place], [place] [address]    Culture (A)  Final    ESCHERICHIA COLI Confirmed Extended Spectrum Beta-Lactamase Producer (ESBL).  In bloodstream infections from ESBL organisms, carbapenems are preferred over piperacillin/tazobactam. They are shown to have a lower risk of mortality.    Report Status 06/21/2022 FINAL  Final   Organism ID, Bacteria ESCHERICHIA COLI  Final      Susceptibility   Escherichia coli - MIC*    AMPICILLIN >=32 RESISTANT Resistant     CEFAZOLIN >=64 RESISTANT Resistant     CEFEPIME >=32 RESISTANT Resistant     CEFTAZIDIME >=64 RESISTANT Resistant     CEFTRIAXONE >=64 RESISTANT Resistant  CIPROFLOXACIN >=4 RESISTANT Resistant     GENTAMICIN <=1 SENSITIVE Sensitive     IMIPENEM <=0.25 SENSITIVE Sensitive     TRIMETH/SULFA >=320 RESISTANT Resistant     AMPICILLIN/SULBACTAM >=32 RESISTANT Resistant     PIP/TAZO 64 INTERMEDIATE Intermediate     * ESCHERICHIA COLI  Blood Culture ID Panel (Reflexed)     Status: Abnormal   Collection Time: 07/07/2022  5:53 AM  Result Value Ref Range Status   Enterococcus faecalis NOT DETECTED NOT DETECTED Final   Enterococcus Faecium NOT DETECTED NOT DETECTED Final   Listeria monocytogenes NOT DETECTED NOT DETECTED Final   Staphylococcus species NOT DETECTED NOT DETECTED Final   Staphylococcus aureus (BCID) NOT DETECTED NOT DETECTED Final   Staphylococcus epidermidis NOT DETECTED NOT DETECTED Final   Staphylococcus lugdunensis NOT DETECTED NOT DETECTED Final   Streptococcus species NOT DETECTED NOT DETECTED Final   Streptococcus agalactiae NOT DETECTED NOT DETECTED Final   Streptococcus pneumoniae NOT DETECTED NOT DETECTED Final   Streptococcus pyogenes NOT DETECTED NOT DETECTED Final    A.calcoaceticus-baumannii NOT DETECTED NOT DETECTED Final   Bacteroides fragilis NOT DETECTED NOT DETECTED Final   Enterobacterales DETECTED (A) NOT DETECTED Final    Comment: Enterobacterales represent a large order of gram negative bacteria, not a single organism. CRITICAL RESULT CALLED TO, READ BACK BY AND VERIFIED WITH: N ROME,RN'@0142'$  06/20/22 [place]    Enterobacter cloacae complex NOT DETECTED NOT DETECTED Final   Escherichia coli DETECTED (A) NOT DETECTED Final    Comment: CRITICAL RESULT CALLED TO, READ BACK BY AND VERIFIED WITH: N ROME,RN'@0142'$  06/20/22 [place]    Klebsiella aerogenes NOT DETECTED NOT DETECTED Final   Klebsiella oxytoca NOT DETECTED NOT DETECTED Final   Klebsiella pneumoniae NOT DETECTED NOT DETECTED Final   Proteus species NOT DETECTED NOT DETECTED Final   Salmonella species NOT DETECTED NOT DETECTED Final   Serratia marcescens NOT DETECTED NOT DETECTED Final   Haemophilus influenzae NOT DETECTED NOT DETECTED Final   Neisseria meningitidis NOT DETECTED NOT DETECTED Final   Pseudomonas aeruginosa NOT DETECTED NOT DETECTED Final   Stenotrophomonas maltophilia NOT DETECTED NOT DETECTED Final   Candida albicans NOT DETECTED NOT DETECTED Final   Candida auris NOT DETECTED NOT DETECTED Final   Candida glabrata NOT DETECTED NOT DETECTED Final   Candida krusei NOT DETECTED NOT DETECTED Final   Candida parapsilosis NOT DETECTED NOT DETECTED Final   Candida tropicalis NOT DETECTED NOT DETECTED Final   Cryptococcus neoformans/gattii NOT DETECTED NOT DETECTED Final   CTX-M ESBL DETECTED (A) NOT DETECTED Final    Comment: CRITICAL RESULT CALLED TO, READ BACK BY AND VERIFIED WITH: N ROME,RN'@0142'$  06/20/22 [place] (NOTE) Extended spectrum beta-lactamase detected. Recommend a carbapenem as initial therapy.      Carbapenem resistance IMP NOT DETECTED NOT DETECTED Final   Carbapenem resistance KPC NOT DETECTED NOT DETECTED Final   Carbapenem resistance NDM NOT DETECTED NOT DETECTED  Final   Carbapenem resist OXA 48 LIKE NOT DETECTED NOT DETECTED Final   Carbapenem resistance VIM NOT DETECTED NOT DETECTED Final    Comment: Performed at [facility], [place] [address] [place], [place] [address]  Resp Panel by RT-PCR (Flu A&B, Covid) Anterior Nasal Swab     Status: None   Collection Time: 06/09/2022  7:31 AM   Specimen: Anterior Nasal Swab  Result Value Ref Range Status   SARS Coronavirus 2 by RT PCR NEGATIVE NEGATIVE Final    Comment: (NOTE) SARS-CoV-2 target nucleic acids are NOT DETECTED.  The SARS-CoV-2  RNA is generally detectable in upper respiratory specimens during the acute phase of infection. The lowest concentration of SARS-CoV-2 viral copies this assay can detect is 138 copies/mL. A negative result does not preclude SARS-Cov-2 infection and should not be used as the sole basis for treatment or other patient management decisions. A negative result may occur with  improper specimen collection/handling, submission of specimen other than nasopharyngeal swab, presence of viral mutation(s) within the areas targeted by this assay, and inadequate number of viral copies(<138 copies/mL). A negative result must be combined with clinical observations, patient history, and epidemiological information. The expected result is Negative.  Fact Sheet for Patients:  EntrepreneurPulse.com.au  Fact Sheet for Healthcare Providers:  IncredibleEmployment.be  This test is no t yet approved or cleared by the [place] FDA and  has been authorized for detection and/or diagnosis of SARS-CoV-2 by FDA under an Emergency Use Authorization (EUA). This EUA will remain  in effect (meaning this test can be used) for the duration of the COVID-19 declaration under Section 564(b)(1) of the Act, 21 U.S.C.section 360bbb-3(b)(1), unless the authorization is terminated  or revoked sooner.       Influenza A by PCR NEGATIVE NEGATIVE Final    Influenza B by PCR NEGATIVE NEGATIVE Final    Comment: (NOTE) The Xpert Xpress SARS-CoV-2/FLU/RSV plus assay is intended as an aid in the diagnosis of influenza from Nasopharyngeal swab specimens and should not be used as a sole basis for treatment. Nasal washings and aspirates are unacceptable for Xpert Xpress SARS-CoV-2/FLU/RSV testing.  Fact Sheet for Patients: EntrepreneurPulse.com.au  Fact Sheet for Healthcare Providers: IncredibleEmployment.be  This test is not yet approved or cleared by the [place] FDA and has been authorized for detection and/or diagnosis of SARS-CoV-2 by FDA under an Emergency Use Authorization (EUA). This EUA will remain in effect (meaning this test can be used) for the duration of the COVID-19 declaration under Section 564(b)(1) of the Act, 21 U.S.C. section 360bbb-3(b)(1), unless the authorization is terminated or revoked.  Performed at [facility], [address], [place], [place] [address]   MRSA Next Gen by PCR, Nasal     Status: Abnormal   Collection Time: 06/20/22  1:26 AM   Specimen: Nasal Mucosa; Nasal Swab  Result Value Ref Range Status   MRSA by PCR Next Gen DETECTED (A) NOT DETECTED Final    Comment: RESULT CALLED TO, READ BACK BY AND VERIFIED WITH: ROOS G @ 0949 ON [id number] BY HENDERSON L (NOTE) The GeneXpert MRSA Assay (FDA approved for NASAL specimens only), is one component of a comprehensive MRSA colonization surveillance program. It is not intended to diagnose MRSA infection nor to guide or monitor treatment for MRSA infections. Test performance is not FDA approved in patients less than 13 years old. Performed at [facility], [address] [place], [place] [address]      Radiology Studies: [place] EKG SITE RITE  Result Date: 06/20/2022 If Site Rite image not attached, placement could not be confirmed due to current cardiac rhythm.  DG CHEST PORT 1 VIEW  Result Date: 06/16/2022 CLINICAL  DATA:  Dyspnea, wheezing EXAM: PORTABLE CHEST 1 VIEW COMPARISON:  Chest radiograph from earlier today. FINDINGS: Right rotated chest radiograph. No pneumothorax or pleural effusion. No pulmonary edema. Indistinct mild hazy bilateral lower lung opacities are unchanged. Stable cardiomediastinal silhouette with normal heart size. IMPRESSION: Indistinct mild hazy bilateral lower lung opacities, unchanged, favor multifocal pneumonia as described on chest CT from earlier today. Follow-up noncontrast chest CT recommended  in 4-6 weeks as per chest CT report earlier today. Electronically Signed   By: Ilona Sorrel M.D.   On: 06/10/2022 14:34    Scheduled Meds: Continuous Infusions:  sodium chloride Stopped (06/20/22 1910)   HYDROmorphone 0.5 mg/hr (06/21/22 0044)     LOS: 2 days   Time spent: 24 mins   Alexandre Lightsey Wynetta Emery, MD How to contact the [facility] Attending or Consulting provider [place] or covering provider during after hours [place], for this patient?  Check the care team in [facility] and look for a) attending/consulting [facility] provider listed and b) the [facility] team listed Log into [email or web address] and use [place]'s universal password to access. If you do not have the password, please contact the hospital operator. Locate the [facility] provider you are looking for under [facility] and page to a number that you can be directly reached. If you still have difficulty reaching the provider, please page the [facility] (Director on Call) for the Hospitalists listed on amion for assistance.  06/21/2022, 12:06 PM

## 2022-06-21 NOTE — Progress Notes (Signed)
Late entry, Pt RR lower, discussed with Dr. Josph Macho about decreasing Dilaudid gtt from 0.89mhr to 0.'5mg'$ /hr, order was placed to modify dilaudid gtt to 0.'5mg'$ /hr, dose changed with Namira RN at bedside. Family update, family decided to go home for some rest, pt continues with agonal breathing but slight increased RR with decrease in Dilaudid gtt. Pt continues to appear comfortable and resting but is unresponsive.

## 2022-06-22 DIAGNOSIS — A4151 Sepsis due to Escherichia coli [E. coli]: Secondary | ICD-10-CM | POA: Diagnosis not present

## 2022-07-09 NOTE — Progress Notes (Signed)
52.5 ml of hydromorphone 0.'5mg'$ /ml wasted in medication waste bin with Garth Bigness, RN

## 2022-07-09 NOTE — Progress Notes (Signed)
Pt expired at 0026, confirmed by 2 nurses.  Dr. Wynetta Emery to sign death certificate.  [facility] notified, pt released from donation.  Referral number [id number].  Family present at time of death, declined need to notify additional family members.  Post mortem flow sheet completed.  No belongings in room to return to family.  Emotional support provided.

## 2022-07-09 NOTE — Death Summary Note (Addendum)
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  She was noted to have fever of 102 and findings of sepsis.  She was tachypneic and tachycardic with an irregular heart rhythm.  Started on sepsis protocol.  Chest x-ray abnormal and CT chest suggesting multifocal pneumonia and findings of pulmonary hypertension.  Work of breathing increased and was placed on bipap.  Patient started on IV antibiotics and admission was requested for further management.    Diagnoses  Preliminary cause of death: E Coli Bacteremia  Secondary Diagnoses (including complications and co-morbidities):  Principal Problem:   Severe Sepsis Active Problems:   Multifocal pneumonia   FTT (failure to thrive) in adult   E. coli bacteremia   Essential hypertension   Takotsubo syndrome   COPD (chronic obstructive pulmonary disease) (HCC)   Carotid stenosis   Anemia   Lumbar compression fracture (HCC)   AKI (acute kidney injury) (HCC)   Protein-calorie malnutrition, severe   Stage 3b chronic kidney disease (CKD) (HCC)   Neurocognitive deficits   Atrial  fibrillation (HCC)   Fever, unspecified   Elevated brain natriuretic peptide (BNP) level   Chronic respiratory failure (HCC)   Supplemental oxygen dependent   Brief Hospital Course (including significant findings, care, treatment, and services provided and events leading to death)  Jordan [name] is a 76 y.o. year old severely debilitated female long term care resident of SNF who presented with severe sepsis, e coli bacteremia, multifocal pneumonia with progressive hypoxia, acute on chronic respiratory distress, hypotension, increased work of breathing.  Pt had expressed her wish was to NOT be intubated and was placed on bipap. Bipap therapy has failed as patient continued to have progressive SOB and increasing work of breathing.  I had a goals of care discussion urgently called in friends, spoke with cousin on phone and all in agreement to transition to full comfort care measures as patient was actively dying and to focus care on comfort and dignity.   Pertinent Labs and Studies  Significant Diagnostic Studies [place] EKG SITE RITE  Result Date: 06/20/2022 If Site Rite image not attached, placement could not be confirmed due to current cardiac rhythm.  DG CHEST PORT 1 VIEW  Result Date: 26-Jun-2022 CLINICAL DATA:  Dyspnea, wheezing EXAM: PORTABLE CHEST 1 VIEW COMPARISON:  Chest radiograph from earlier today. FINDINGS: Right rotated chest radiograph. No pneumothorax or pleural effusion. No pulmonary edema. Indistinct mild hazy bilateral lower lung opacities are unchanged. Stable cardiomediastinal silhouette with normal heart size. IMPRESSION: Indistinct mild hazy bilateral lower lung opacities, unchanged, favor multifocal pneumonia as described on chest CT from earlier today. Follow-up noncontrast chest CT recommended in 4-6 weeks as per chest CT report earlier today. Electronically Signed  By: Ilona Sorrel M.D.   On: 06/20/2022 14:34   CT Chest Wo Contrast  Result Date: 07/08/2022 CLINICAL DATA:   Shortness of breath.  Abnormal chest radiograph. EXAM: CT CHEST WITHOUT CONTRAST TECHNIQUE: Multidetector CT imaging of the chest was performed following the standard protocol without IV contrast. RADIATION DOSE REDUCTION: This exam was performed according to the departmental dose-optimization program which includes automated exposure control, adjustment of the mA and/or kV according to patient size and/or use of iterative reconstruction technique. COMPARISON:  09/07/2021 CT.  Today's chest radiograph. FINDINGS: Cardiovascular: Aortic atherosclerosis. Tortuous thoracic aorta. Mild cardiomegaly, without pericardial effusion. Multivessel coronary artery atherosclerosis. Pulmonary artery enlargement, outflow tract 3.2 cm. Mediastinum/Nodes: New precarinal adenopathy at 1.3 cm on 55/2. Prevascular adenopathy is mild and new at 7 mm. Hilar regions poorly evaluated without intravenous contrast. Tiny hiatal hernia. Lungs/Pleura: No pleural fluid. Right hemidiaphragm elevation. Bilateral, lower lung predominant areas of nodular consolidation. Example 2.3 cm on 80/4 in the right lower lobe. A 1.5 cm area of presumed nodular consolidation in the left lower lobe on 98/4 has surrounding ground-glass. Upper Abdomen: Cholecystectomy. Normal imaged portions of the spleen, left adrenal gland, left kidney. Progressive staghorn calculus within the right kidney with overlying moderate cortical thinning. Mild right adrenal thickening. Musculoskeletal: Advanced degenerative changes of the left shoulder. T10, T12, and L2 moderate compression deformities. The L2 compression deformity is new since 09/07/2021. IMPRESSION: 1. Multifocal, lower lung predominant areas of nodular consolidation which are most likely related to infection. 2. New thoracic adenopathy, favored to be reactive. 3. Recommend appropriate antibiotic therapy and CT follow-up at 4-6 weeks to confirm resolution of each of these findings. 4. Progressive right-sided staghorn  calculus with overlying cortical thinning. 5.  Tiny hiatal hernia. 6. Coronary artery atherosclerosis. Aortic Atherosclerosis (ICD10-I70.0). 7. Pulmonary artery enlargement suggests pulmonary arterial hypertension. 8. Thoracolumbar compression deformities, including a new L2 compression deformity since 09/07/2021. Electronically Signed   By: Abigail Miyamoto M.D.   On: 06/29/2022 06:25   DG Chest Portable 1 View  Result Date: 06/11/2022 CLINICAL DATA:  Shortness of breath. EXAM: PORTABLE CHEST 1 VIEW COMPARISON:  03/24/2022 FINDINGS: Cardiopericardial silhouette is at upper limits of normal for size. Interstitial markings are diffusely coarsened with chronic features. New nodular opacity is identified in the infrahilar left lower lung. No overt airspace pulmonary edema or substantial pleural effusion. No focal airspace consolidation. Bones are diffusely demineralized. IMPRESSION: 1. New nodular opacity in the infrahilar left lower lung. CT chest without contrast recommended to further evaluate. 2. Chronic interstitial changes. No pulmonary edema or focal lung consolidation. Electronically Signed   By: Misty Stanley M.D.   On: 07/02/2022 05:17    Microbiology Recent Results (from the past 240 hour(s))  Blood Culture (routine x 2)     Status: Abnormal   Collection Time: 06/18/2022  5:53 AM   Specimen: BLOOD RIGHT FOREARM  Result Value Ref Range Status   Specimen Description   Final    BLOOD RIGHT FOREARM BOTTLES DRAWN AEROBIC AND ANAEROBIC Performed at [facility], [address], [place], [place] [address]    Special Requests   Final    Blood Culture adequate volume Performed at [facility], [address] [place], [place] [address]    Culture  Setup Time   Final    BOTTLES DRAWN AEROBIC AND ANAEROBIC GRAM NEGATIVE RODS Gram Stain Report Called to,Read Back By and Verified With: T Kathrynn Humble RN [phone] [id number] [facility] Organism ID to follow CRITICAL RESULT CALLED TO,  READ BACK BY AND VERIFIED WITH: N  ROME,RN'@0142'$  06/20/22 [place]    Culture (A)  Final    ESCHERICHIA COLI SUSCEPTIBILITIES PERFORMED ON PREVIOUS CULTURE WITHIN THE LAST 5 DAYS. Performed at [facility], [place] [address], [place], [place] [address]    Report Status 06/21/2022 FINAL  Final  Blood Culture (routine x 2)     Status: Abnormal   Collection Time: 07/07/2022  5:53 AM   Specimen: BLOOD LEFT FOREARM  Result Value Ref Range Status   Specimen Description   Final    BLOOD LEFT FOREARM BOTTLES DRAWN AEROBIC AND ANAEROBIC Performed at [facility], [address], [place], [place] [address]    Special Requests   Final    Blood Culture adequate volume Performed at [facility], [address] [place], [place] [address]    Culture  Setup Time   Final    BOTTLES DRAWN AEROBIC AND ANAEROBIC GRAM NEGATIVE RODS CRITICAL VALUE NOTED.  VALUE IS CONSISTENT WITH PREVIOUSLY REPORTED AND CALLED VALUE. Performed at [facility], [address], [place], [place] [address]    Culture (A)  Final    ESCHERICHIA COLI Confirmed Extended Spectrum Beta-Lactamase Producer (ESBL).  In bloodstream infections from ESBL organisms, carbapenems are preferred over piperacillin/tazobactam. They are shown to have a lower risk of mortality.    Report Status 06/21/2022 FINAL  Final   Organism ID, Bacteria ESCHERICHIA COLI  Final      Susceptibility   Escherichia coli - MIC*    AMPICILLIN >=32 RESISTANT Resistant     CEFAZOLIN >=64 RESISTANT Resistant     CEFEPIME >=32 RESISTANT Resistant     CEFTAZIDIME >=64 RESISTANT Resistant     CEFTRIAXONE >=64 RESISTANT Resistant     CIPROFLOXACIN >=4 RESISTANT Resistant     GENTAMICIN <=1 SENSITIVE Sensitive     IMIPENEM <=0.25 SENSITIVE Sensitive     TRIMETH/SULFA >=320 RESISTANT Resistant     AMPICILLIN/SULBACTAM >=32 RESISTANT Resistant     PIP/TAZO 64 INTERMEDIATE Intermediate     * ESCHERICHIA COLI  Blood Culture ID Panel (Reflexed)     Status: Abnormal   Collection Time: 06/24/2022  5:53 AM   Result Value Ref Range Status   Enterococcus faecalis NOT DETECTED NOT DETECTED Final   Enterococcus Faecium NOT DETECTED NOT DETECTED Final   Listeria monocytogenes NOT DETECTED NOT DETECTED Final   Staphylococcus species NOT DETECTED NOT DETECTED Final   Staphylococcus aureus (BCID) NOT DETECTED NOT DETECTED Final   Staphylococcus epidermidis NOT DETECTED NOT DETECTED Final   Staphylococcus lugdunensis NOT DETECTED NOT DETECTED Final   Streptococcus species NOT DETECTED NOT DETECTED Final   Streptococcus agalactiae NOT DETECTED NOT DETECTED Final   Streptococcus pneumoniae NOT DETECTED NOT DETECTED Final   Streptococcus pyogenes NOT DETECTED NOT DETECTED Final   A.calcoaceticus-baumannii NOT DETECTED NOT DETECTED Final   Bacteroides fragilis NOT DETECTED NOT DETECTED Final   Enterobacterales DETECTED (A) NOT DETECTED Final    Comment: Enterobacterales represent a large order of gram negative bacteria, not a single organism. CRITICAL RESULT CALLED TO, READ BACK BY AND VERIFIED WITH: N ROME,RN'@0142'$  06/20/22 [place]    Enterobacter cloacae complex NOT DETECTED NOT DETECTED Final   Escherichia coli DETECTED (A) NOT DETECTED Final    Comment: CRITICAL RESULT CALLED TO, READ BACK BY AND VERIFIED WITH: N ROME,RN'@0142'$  06/20/22 [place]    Klebsiella aerogenes NOT DETECTED NOT DETECTED Final   Klebsiella oxytoca NOT DETECTED NOT DETECTED Final   Klebsiella pneumoniae NOT DETECTED NOT DETECTED Final  Proteus species NOT DETECTED NOT DETECTED Final   Salmonella species NOT DETECTED NOT DETECTED Final   Serratia marcescens NOT DETECTED NOT DETECTED Final   Haemophilus influenzae NOT DETECTED NOT DETECTED Final   Neisseria meningitidis NOT DETECTED NOT DETECTED Final   Pseudomonas aeruginosa NOT DETECTED NOT DETECTED Final   Stenotrophomonas maltophilia NOT DETECTED NOT DETECTED Final   Candida albicans NOT DETECTED NOT DETECTED Final   Candida auris NOT DETECTED NOT DETECTED Final   Candida  glabrata NOT DETECTED NOT DETECTED Final   Candida krusei NOT DETECTED NOT DETECTED Final   Candida parapsilosis NOT DETECTED NOT DETECTED Final   Candida tropicalis NOT DETECTED NOT DETECTED Final   Cryptococcus neoformans/gattii NOT DETECTED NOT DETECTED Final   CTX-M ESBL DETECTED (A) NOT DETECTED Final    Comment: CRITICAL RESULT CALLED TO, READ BACK BY AND VERIFIED WITH: N ROME,RN'@0142'$  06/20/22 [place] (NOTE) Extended spectrum beta-lactamase detected. Recommend a carbapenem as initial therapy.      Carbapenem resistance IMP NOT DETECTED NOT DETECTED Final   Carbapenem resistance KPC NOT DETECTED NOT DETECTED Final   Carbapenem resistance NDM NOT DETECTED NOT DETECTED Final   Carbapenem resist OXA 48 LIKE NOT DETECTED NOT DETECTED Final   Carbapenem resistance VIM NOT DETECTED NOT DETECTED Final    Comment: Performed at [facility], [place] [address] [place], [place] [address]  Resp Panel by RT-PCR (Flu A&B, Covid) Anterior Nasal Swab     Status: None   Collection Time: 06/23/2022  7:31 AM   Specimen: Anterior Nasal Swab  Result Value Ref Range Status   SARS Coronavirus 2 by RT PCR NEGATIVE NEGATIVE Final    Comment: (NOTE) SARS-CoV-2 target nucleic acids are NOT DETECTED.  The SARS-CoV-2 RNA is generally detectable in upper respiratory specimens during the acute phase of infection. The lowest concentration of SARS-CoV-2 viral copies this assay can detect is 138 copies/mL. A negative result does not preclude SARS-Cov-2 infection and should not be used as the sole basis for treatment or other patient management decisions. A negative result may occur with  improper specimen collection/handling, submission of specimen other than nasopharyngeal swab, presence of viral mutation(s) within the areas targeted by this assay, and inadequate number of viral copies(<138 copies/mL). A negative result must be combined with clinical observations, patient history, and  epidemiological information. The expected result is Negative.  Fact Sheet for Patients:  EntrepreneurPulse.com.au  Fact Sheet for Healthcare Providers:  IncredibleEmployment.be  This test is no t yet approved or cleared by the [place] FDA and  has been authorized for detection and/or diagnosis of SARS-CoV-2 by FDA under an Emergency Use Authorization (EUA). This EUA will remain  in effect (meaning this test can be used) for the duration of the COVID-19 declaration under Section 564(b)(1) of the Act, 21 U.S.C.section 360bbb-3(b)(1), unless the authorization is terminated  or revoked sooner.       Influenza A by PCR NEGATIVE NEGATIVE Final   Influenza B by PCR NEGATIVE NEGATIVE Final    Comment: (NOTE) The Xpert Xpress SARS-CoV-2/FLU/RSV plus assay is intended as an aid in the diagnosis of influenza from Nasopharyngeal swab specimens and should not be used as a sole basis for treatment. Nasal washings and aspirates are unacceptable for Xpert Xpress SARS-CoV-2/FLU/RSV testing.  Fact Sheet for Patients: EntrepreneurPulse.com.au  Fact Sheet for Healthcare Providers: IncredibleEmployment.be  This test is not yet approved or cleared by the [place] FDA and has been authorized for detection and/or diagnosis of SARS-CoV-2 by FDA  under an Emergency Use Authorization (EUA). This EUA will remain in effect (meaning this test can be used) for the duration of the COVID-19 declaration under Section 564(b)(1) of the Act, 21 U.S.C. section 360bbb-3(b)(1), unless the authorization is terminated or revoked.  Performed at [facility], [address] [place], [place] [address]   MRSA Next Gen by PCR, Nasal     Status: Abnormal   Collection Time: 06/20/22  1:26 AM   Specimen: Nasal Mucosa; Nasal Swab  Result Value Ref Range Status   MRSA by PCR Next Gen DETECTED (A) NOT DETECTED Final    Comment: RESULT CALLED  TO, READ BACK BY AND VERIFIED WITH: ROOS G @ 0949 ON [id number] BY HENDERSON L (NOTE) The GeneXpert MRSA Assay (FDA approved for NASAL specimens only), is one component of a comprehensive MRSA colonization surveillance program. It is not intended to diagnose MRSA infection nor to guide or monitor treatment for MRSA infections. Test performance is not FDA approved in patients less than 68 years old. Performed at [facility], [address], [place], [place] [address]     Lab Basic Metabolic Panel: Recent Labs  Lab 06/30/2022 0504 06/20/22 0350  NA 137 142  K 5.0 4.2  CL 115* 122*  CO2 14* 10*  GLUCOSE 96 129*  BUN 48* 53*  CREATININE 2.66* 3.63*  CALCIUM 8.3* 7.2*  MG  --  1.7   Liver Function Tests: Recent Labs  Lab 06/10/2022 0504  AST 24  ALT 26  ALKPHOS 59  BILITOT 0.6  PROT 6.6  ALBUMIN 2.8*   No results for input(s): "LIPASE", "AMYLASE" in the last 168 hours. No results for input(s): "AMMONIA" in the last 168 hours. CBC: Recent Labs  Lab 06/24/2022 0504 06/20/22 0350  WBC 10.5 29.5*  NEUTROABS 9.2* 28.3*  HGB 8.7* 7.7*  HCT 27.1* 25.3*  MCV 96.1 101.6*  PLT 304 168   Cardiac Enzymes: No results for input(s): "CKTOTAL", "CKMB", "CKMBINDEX", "TROPONINI" in the last 168 hours. Sepsis Labs: Recent Labs  Lab 06/13/2022 0504 07/02/2022 0553 06/20/22 0350  WBC 10.5  --  29.5*  LATICACIDVEN  --  1.7  --     Procedures/Operations   Yoland Scherr 06/26/22, 7:22 AM

## 2022-07-09 DEATH — deceased

## 2022-07-24 ENCOUNTER — Ambulatory Visit: Payer: Medicare Other | Admitting: Student
